# Patient Record
Sex: Female | Born: 1935 | Race: White | Hispanic: No | Marital: Married | Smoking: Former smoker
Health system: Southern US, Community
[De-identification: ages and names within clinical notes are randomized; demographics above are authoritative.]

## PROBLEM LIST (undated history)

## (undated) DIAGNOSIS — M1990 Unspecified osteoarthritis, unspecified site: Secondary | ICD-10-CM

## (undated) DIAGNOSIS — I10 Essential (primary) hypertension: Secondary | ICD-10-CM

## (undated) DIAGNOSIS — N189 Chronic kidney disease, unspecified: Secondary | ICD-10-CM

## (undated) DIAGNOSIS — E039 Hypothyroidism, unspecified: Secondary | ICD-10-CM

## (undated) HISTORY — PX: ABDOMINAL SURGERY: SHX537

## (undated) HISTORY — PX: ABDOMINAL HYSTERECTOMY: SHX81

## (undated) HISTORY — PX: APPENDECTOMY: SHX54

## (undated) HISTORY — PX: EYE SURGERY: SHX253

---

## 2005-07-24 ENCOUNTER — Ambulatory Visit: Payer: Self-pay | Admitting: Ophthalmology

## 2006-10-09 ENCOUNTER — Ambulatory Visit: Payer: Self-pay | Admitting: Internal Medicine

## 2008-02-17 ENCOUNTER — Ambulatory Visit: Payer: Self-pay | Admitting: Internal Medicine

## 2009-02-24 ENCOUNTER — Ambulatory Visit: Payer: Self-pay | Admitting: Internal Medicine

## 2011-01-03 ENCOUNTER — Ambulatory Visit: Payer: Self-pay | Admitting: Internal Medicine

## 2011-01-10 ENCOUNTER — Ambulatory Visit: Payer: Self-pay | Admitting: Internal Medicine

## 2015-06-27 DIAGNOSIS — N183 Chronic kidney disease, stage 3 (moderate): Secondary | ICD-10-CM | POA: Diagnosis not present

## 2015-06-27 DIAGNOSIS — M810 Age-related osteoporosis without current pathological fracture: Secondary | ICD-10-CM | POA: Diagnosis not present

## 2015-06-27 DIAGNOSIS — E559 Vitamin D deficiency, unspecified: Secondary | ICD-10-CM | POA: Diagnosis not present

## 2015-06-27 DIAGNOSIS — E785 Hyperlipidemia, unspecified: Secondary | ICD-10-CM | POA: Diagnosis not present

## 2015-07-04 DIAGNOSIS — R030 Elevated blood-pressure reading, without diagnosis of hypertension: Secondary | ICD-10-CM | POA: Diagnosis not present

## 2015-07-04 DIAGNOSIS — F419 Anxiety disorder, unspecified: Secondary | ICD-10-CM | POA: Diagnosis not present

## 2015-07-04 DIAGNOSIS — E559 Vitamin D deficiency, unspecified: Secondary | ICD-10-CM | POA: Diagnosis not present

## 2015-07-04 DIAGNOSIS — Z0000 Encounter for general adult medical examination without abnormal findings: Secondary | ICD-10-CM | POA: Diagnosis not present

## 2015-07-04 DIAGNOSIS — M810 Age-related osteoporosis without current pathological fracture: Secondary | ICD-10-CM | POA: Diagnosis not present

## 2015-07-04 DIAGNOSIS — Z720 Tobacco use: Secondary | ICD-10-CM | POA: Diagnosis not present

## 2015-07-04 DIAGNOSIS — E784 Other hyperlipidemia: Secondary | ICD-10-CM | POA: Diagnosis not present

## 2015-07-04 DIAGNOSIS — E038 Other specified hypothyroidism: Secondary | ICD-10-CM | POA: Diagnosis not present

## 2015-07-14 DIAGNOSIS — M810 Age-related osteoporosis without current pathological fracture: Secondary | ICD-10-CM | POA: Diagnosis not present

## 2015-07-20 DIAGNOSIS — M810 Age-related osteoporosis without current pathological fracture: Secondary | ICD-10-CM | POA: Diagnosis not present

## 2015-07-20 DIAGNOSIS — E559 Vitamin D deficiency, unspecified: Secondary | ICD-10-CM | POA: Diagnosis not present

## 2015-07-20 DIAGNOSIS — R946 Abnormal results of thyroid function studies: Secondary | ICD-10-CM | POA: Diagnosis not present

## 2015-07-22 DIAGNOSIS — M810 Age-related osteoporosis without current pathological fracture: Secondary | ICD-10-CM | POA: Diagnosis not present

## 2015-08-02 DIAGNOSIS — E038 Other specified hypothyroidism: Secondary | ICD-10-CM | POA: Diagnosis not present

## 2015-10-12 DIAGNOSIS — E559 Vitamin D deficiency, unspecified: Secondary | ICD-10-CM | POA: Diagnosis not present

## 2015-10-19 DIAGNOSIS — M810 Age-related osteoporosis without current pathological fracture: Secondary | ICD-10-CM | POA: Diagnosis not present

## 2015-10-19 DIAGNOSIS — E559 Vitamin D deficiency, unspecified: Secondary | ICD-10-CM | POA: Diagnosis not present

## 2016-06-28 DIAGNOSIS — F419 Anxiety disorder, unspecified: Secondary | ICD-10-CM | POA: Diagnosis not present

## 2016-06-28 DIAGNOSIS — E038 Other specified hypothyroidism: Secondary | ICD-10-CM | POA: Diagnosis not present

## 2016-06-28 DIAGNOSIS — E559 Vitamin D deficiency, unspecified: Secondary | ICD-10-CM | POA: Diagnosis not present

## 2016-06-28 DIAGNOSIS — R030 Elevated blood-pressure reading, without diagnosis of hypertension: Secondary | ICD-10-CM | POA: Diagnosis not present

## 2016-06-28 DIAGNOSIS — M810 Age-related osteoporosis without current pathological fracture: Secondary | ICD-10-CM | POA: Diagnosis not present

## 2016-06-28 DIAGNOSIS — E784 Other hyperlipidemia: Secondary | ICD-10-CM | POA: Diagnosis not present

## 2016-06-28 DIAGNOSIS — Z720 Tobacco use: Secondary | ICD-10-CM | POA: Diagnosis not present

## 2016-07-09 DIAGNOSIS — R030 Elevated blood-pressure reading, without diagnosis of hypertension: Secondary | ICD-10-CM | POA: Diagnosis not present

## 2016-07-09 DIAGNOSIS — H268 Other specified cataract: Secondary | ICD-10-CM | POA: Diagnosis not present

## 2016-07-09 DIAGNOSIS — Z720 Tobacco use: Secondary | ICD-10-CM | POA: Diagnosis not present

## 2016-07-09 DIAGNOSIS — F419 Anxiety disorder, unspecified: Secondary | ICD-10-CM | POA: Diagnosis not present

## 2016-07-09 DIAGNOSIS — R946 Abnormal results of thyroid function studies: Secondary | ICD-10-CM | POA: Diagnosis not present

## 2016-07-09 DIAGNOSIS — J449 Chronic obstructive pulmonary disease, unspecified: Secondary | ICD-10-CM | POA: Diagnosis not present

## 2016-07-09 DIAGNOSIS — E559 Vitamin D deficiency, unspecified: Secondary | ICD-10-CM | POA: Diagnosis not present

## 2016-07-09 DIAGNOSIS — M810 Age-related osteoporosis without current pathological fracture: Secondary | ICD-10-CM | POA: Diagnosis not present

## 2016-07-09 DIAGNOSIS — Z87898 Personal history of other specified conditions: Secondary | ICD-10-CM | POA: Diagnosis not present

## 2016-07-09 DIAGNOSIS — E784 Other hyperlipidemia: Secondary | ICD-10-CM | POA: Diagnosis not present

## 2016-07-09 DIAGNOSIS — Z0000 Encounter for general adult medical examination without abnormal findings: Secondary | ICD-10-CM | POA: Diagnosis not present

## 2016-07-30 DIAGNOSIS — R946 Abnormal results of thyroid function studies: Secondary | ICD-10-CM | POA: Diagnosis not present

## 2016-08-01 DIAGNOSIS — H2512 Age-related nuclear cataract, left eye: Secondary | ICD-10-CM | POA: Diagnosis not present

## 2016-08-06 DIAGNOSIS — E784 Other hyperlipidemia: Secondary | ICD-10-CM | POA: Diagnosis not present

## 2016-08-06 DIAGNOSIS — E039 Hypothyroidism, unspecified: Secondary | ICD-10-CM | POA: Diagnosis not present

## 2016-08-06 DIAGNOSIS — D692 Other nonthrombocytopenic purpura: Secondary | ICD-10-CM | POA: Diagnosis not present

## 2016-08-06 DIAGNOSIS — E559 Vitamin D deficiency, unspecified: Secondary | ICD-10-CM | POA: Diagnosis not present

## 2016-08-06 DIAGNOSIS — M810 Age-related osteoporosis without current pathological fracture: Secondary | ICD-10-CM | POA: Diagnosis not present

## 2016-08-06 DIAGNOSIS — Z720 Tobacco use: Secondary | ICD-10-CM | POA: Diagnosis not present

## 2016-08-06 DIAGNOSIS — R030 Elevated blood-pressure reading, without diagnosis of hypertension: Secondary | ICD-10-CM | POA: Diagnosis not present

## 2016-08-14 DIAGNOSIS — J40 Bronchitis, not specified as acute or chronic: Secondary | ICD-10-CM | POA: Diagnosis not present

## 2016-09-25 DIAGNOSIS — H2512 Age-related nuclear cataract, left eye: Secondary | ICD-10-CM | POA: Diagnosis not present

## 2016-10-03 ENCOUNTER — Encounter: Payer: Self-pay | Admitting: *Deleted

## 2016-10-04 ENCOUNTER — Ambulatory Visit
Admission: RE | Admit: 2016-10-04 | Discharge: 2016-10-04 | Disposition: A | Discharge status: Home / Self Care | Payer: PPO | Source: Ambulatory Visit | Attending: Ophthalmology | Admitting: Ophthalmology

## 2016-10-04 ENCOUNTER — Ambulatory Visit: Payer: PPO | Admitting: Certified Registered"

## 2016-10-04 ENCOUNTER — Encounter: Payer: Self-pay | Admitting: *Deleted

## 2016-10-04 ENCOUNTER — Encounter
Admission: RE | Disposition: A | Discharge status: Home / Self Care | Payer: Self-pay | Source: Ambulatory Visit | Attending: Ophthalmology

## 2016-10-04 DIAGNOSIS — H2512 Age-related nuclear cataract, left eye: Secondary | ICD-10-CM | POA: Insufficient documentation

## 2016-10-04 DIAGNOSIS — Z79899 Other long term (current) drug therapy: Secondary | ICD-10-CM | POA: Diagnosis not present

## 2016-10-04 DIAGNOSIS — E039 Hypothyroidism, unspecified: Secondary | ICD-10-CM | POA: Diagnosis not present

## 2016-10-04 DIAGNOSIS — F17200 Nicotine dependence, unspecified, uncomplicated: Secondary | ICD-10-CM | POA: Insufficient documentation

## 2016-10-04 HISTORY — PX: CATARACT EXTRACTION W/PHACO: SHX586

## 2016-10-04 HISTORY — DX: Hypothyroidism, unspecified: E03.9

## 2016-10-04 SURGERY — PHACOEMULSIFICATION, CATARACT, WITH IOL INSERTION
Anesthesia: Monitor Anesthesia Care | Site: Eye | Laterality: Left | Wound class: Clean

## 2016-10-04 MED ORDER — MOXIFLOXACIN HCL 0.5 % OP SOLN
OPHTHALMIC | Status: AC
  Filled 2016-10-04: qty 3

## 2016-10-04 MED ORDER — FENTANYL CITRATE (PF) 100 MCG/2ML IJ SOLN
INTRAMUSCULAR | Status: DC | PRN
  Administered 2016-10-04: 50 ug via INTRAVENOUS

## 2016-10-04 MED ORDER — ARMC OPHTHALMIC DILATING DROPS
1.0000 "application " | OPHTHALMIC | Status: AC
  Administered 2016-10-04 (×3): 1 via OPHTHALMIC

## 2016-10-04 MED ORDER — SODIUM HYALURONATE 10 MG/ML IO SOLN
INTRAOCULAR | Status: DC | PRN
  Administered 2016-10-04: 0.85 mL via INTRAOCULAR

## 2016-10-04 MED ORDER — ARMC OPHTHALMIC DILATING DROPS
OPHTHALMIC | Status: AC
  Administered 2016-10-04: 1 via OPHTHALMIC
  Filled 2016-10-04: qty 0.4

## 2016-10-04 MED ORDER — POVIDONE-IODINE 5 % OP SOLN
OPHTHALMIC | Status: AC
  Filled 2016-10-04: qty 30

## 2016-10-04 MED ORDER — MOXIFLOXACIN HCL 0.5 % OP SOLN
OPHTHALMIC | Status: DC | PRN
  Administered 2016-10-04: 1 [drp] via OPHTHALMIC

## 2016-10-04 MED ORDER — POVIDONE-IODINE 5 % OP SOLN
OPHTHALMIC | Status: DC | PRN
  Administered 2016-10-04: 1 via OPHTHALMIC

## 2016-10-04 MED ORDER — SODIUM HYALURONATE 23 MG/ML IO SOLN
INTRAOCULAR | Status: AC
  Filled 2016-10-04: qty 0.6

## 2016-10-04 MED ORDER — ARMC OPHTHALMIC DILATING DROPS: 1.0000 "application " | OPHTHALMIC | Status: DC

## 2016-10-04 MED ORDER — BSS IO SOLN
INTRAOCULAR | Status: DC | PRN
  Administered 2016-10-04: 200 mL via OPHTHALMIC

## 2016-10-04 MED ORDER — LIDOCAINE HCL (PF) 4 % IJ SOLN
INTRAOCULAR | Status: DC | PRN
  Administered 2016-10-04: 4 mL via OPHTHALMIC

## 2016-10-04 MED ORDER — SODIUM HYALURONATE 23 MG/ML IO SOLN
INTRAOCULAR | Status: DC | PRN
  Administered 2016-10-04: 0.6 mL via INTRAOCULAR

## 2016-10-04 MED ORDER — MOXIFLOXACIN HCL 0.5 % OP SOLN: 1.0000 [drp] | OPHTHALMIC | Status: DC | PRN

## 2016-10-04 MED ORDER — EPINEPHRINE PF 1 MG/ML IJ SOLN
INTRAMUSCULAR | Status: AC
  Filled 2016-10-04: qty 2

## 2016-10-04 MED ORDER — LIDOCAINE HCL (PF) 2 % IJ SOLN
INTRAMUSCULAR | Status: AC
  Filled 2016-10-04: qty 2

## 2016-10-04 MED ORDER — CARBACHOL 0.01 % IO SOLN
INTRAOCULAR | Status: DC | PRN
  Administered 2016-10-04: 0.5 mL via INTRAOCULAR

## 2016-10-04 MED ORDER — SODIUM HYALURONATE 10 MG/ML IO SOLN
INTRAOCULAR | Status: AC
  Filled 2016-10-04: qty 0.85

## 2016-10-04 MED ORDER — MIDAZOLAM HCL 2 MG/2ML IJ SOLN
INTRAMUSCULAR | Status: DC | PRN
  Administered 2016-10-04: 1 mg via INTRAVENOUS

## 2016-10-04 MED ORDER — SODIUM CHLORIDE 0.9 % IV SOLN
INTRAVENOUS | Status: DC
  Administered 2016-10-04: 09:00:00 via INTRAVENOUS

## 2016-10-04 SURGICAL SUPPLY — 16 items
DISSECTOR HYDRO NUCLEUS 50X22 (MISCELLANEOUS) ×3 IMPLANT
GLOVE BIO SURGEON STRL SZ8 (GLOVE) ×3 IMPLANT
GLOVE BIOGEL M 6.5 STRL (GLOVE) ×3 IMPLANT
GLOVE SURG LX 7.5 STRW (GLOVE) ×2
GLOVE SURG LX STRL 7.5 STRW (GLOVE) ×1 IMPLANT
GOWN STRL REUS W/ TWL LRG LVL3 (GOWN DISPOSABLE) ×2 IMPLANT
GOWN STRL REUS W/TWL LRG LVL3 (GOWN DISPOSABLE) ×4
LENS IOL ACRSF IQ ULTRA 23.5 (Intraocular Lens) ×1 IMPLANT
LENS IOL ACRYSOF IQ 23.5 (Intraocular Lens) ×3 IMPLANT
PACK CATARACT (MISCELLANEOUS) ×3 IMPLANT
PACK CATARACT KING (MISCELLANEOUS) ×3 IMPLANT
PACK EYE AFTER SURG (MISCELLANEOUS) ×3 IMPLANT
SOL BSS BAG (MISCELLANEOUS) ×3
SOLUTION BSS BAG (MISCELLANEOUS) ×1 IMPLANT
WATER STERILE IRR 250ML POUR (IV SOLUTION) ×3 IMPLANT
WIPE NON LINTING 3.25X3.25 (MISCELLANEOUS) ×3 IMPLANT

## 2016-10-04 NOTE — Anesthesia Preprocedure Evaluation (Signed)
Anesthesia Evaluation  Patient identified by MRN, date of birth, ID band Patient awake    Reviewed: Allergy & Precautions, NPO status , Patient's Chart, lab work & pertinent test results  History of Anesthesia Complications Negative for: history of anesthetic complications  Airway Mallampati: II  TM Distance: >3 FB Neck ROM: Full    Dental no notable dental hx.    Pulmonary neg sleep apnea, neg COPD, Current Smoker,    breath sounds clear to auscultation- rhonchi (-) wheezing      Cardiovascular Exercise Tolerance: Good (-) hypertension(-) CAD and (-) Past MI  Rhythm:Regular Rate:Normal - Systolic murmurs and - Diastolic murmurs    Neuro/Psych negative neurological ROS  negative psych ROS   GI/Hepatic negative GI ROS, Neg liver ROS,   Endo/Other  neg diabetesHypothyroidism   Renal/GU negative Renal ROS     Musculoskeletal negative musculoskeletal ROS (+)   Abdominal (+) - obese,   Peds  Hematology negative hematology ROS (+)   Anesthesia Other Findings Past Medical History: No date: Hypothyroidism   Reproductive/Obstetrics                             Anesthesia Physical Anesthesia Plan  ASA: II  Anesthesia Plan: MAC   Post-op Pain Management:    Induction: Intravenous  Airway Management Planned: Natural Airway  Additional Equipment:   Intra-op Plan:   Post-operative Plan:   Informed Consent: I have reviewed the patients History and Physical, chart, labs and discussed the procedure including the risks, benefits and alternatives for the proposed anesthesia with the patient or authorized representative who has indicated his/her understanding and acceptance.     Plan Discussed with: CRNA and Anesthesiologist  Anesthesia Plan Comments:         Anesthesia Quick Evaluation

## 2016-10-04 NOTE — Transfer of Care (Signed)
Immediate Anesthesia Transfer of Care Note  Patient: Amy Hensley  Procedure(s) Performed: Procedure(s) with comments: CATARACT EXTRACTION PHACO AND INTRAOCULAR LENS PLACEMENT (IOC) (Left) - us02:17.8 ap%19.6 cde27.31 fluid lot # [id number]  Patient Location: Short Stay  Anesthesia Type:MAC  Level of Consciousness: awake, alert  and oriented  Airway & Oxygen Therapy: Patient Spontanous Breathing  Post-op Assessment: Report given to RN and Post -op Vital signs reviewed and stable  Post vital signs: Reviewed  Last Vitals:  Vitals:   10/04/16 0912 10/04/16 1123  BP: (!) 163/82 107/82  Pulse: 75 70  Resp: 18 18  Temp: 36.6 C 36.6 C    Last Pain:  Vitals:   10/04/16 0912  TempSrc: Oral         Complications: No apparent anesthesia complications

## 2016-10-04 NOTE — H&P (Signed)
The History and Physical notes are on paper, have been signed, and are to be scanned.   I have examined the patient and there are no changes to the H&P.   Amy Hensley 10/04/2016 10:38 AM

## 2016-10-04 NOTE — Op Note (Signed)
OPERATIVE NOTE  Amy Hensley [id number] 10/04/2016   PREOPERATIVE DIAGNOSIS:  Nuclear sclerotic cataract left eye.  H25.12   POSTOPERATIVE DIAGNOSIS:    Nuclear sclerotic cataract left eye.     PROCEDURE:  Phacoemusification with posterior chamber intraocular lens placement of the left eye   LENS:   Implant Name Type Inv. Item Serial No. Manufacturer Lot No. LRB No. Used  LENS IOL ACRYSOF IQ 23.5 - [id number] Intraocular Lens LENS IOL ACRYSOF IQ 23.5 [id number] [employer]   Left 1       [id number] +23.5   ULTRASOUND TIME: 2 minutes 17 seconds.  CDE 27.31   SURGEON:  Benay Pillow, MD, MPH   ANESTHESIA:  Topical with tetracaine drops augmented with 1% preservative-free intracameral lidocaine.  ESTIMATED BLOOD LOSS: <1 mL   COMPLICATIONS:  None.   DESCRIPTION OF PROCEDURE:  The patient was identified in the holding room and transported to the operating room and placed in the supine position under the operating microscope.  The left eye was identified as the operative eye and it was prepped and draped in the usual sterile ophthalmic fashion.   A 1.0 millimeter clear-corneal paracentesis was made at the 5:00 position. 0.5 ml of preservative-free 1% lidocaine with epinephrine was injected into the anterior chamber.  The anterior chamber was filled with Healon 5 viscoelastic.  A 2.4 millimeter keratome was used to make a near-clear corneal incision at the 2:00 position.  A curvilinear capsulorrhexis was made with a cystotome and capsulorrhexis forceps.  Balanced salt solution was used to hydrodissect and hydrodelineate the nucleus.   Phacoemulsification was then used in stop and chop fashion to remove the lens nucleus and epinucleus.   The lens was extremely dense, requiring over 10 CDE just to sculpt the initial trench.  Many chopping maneuvers were performed to manipulate the lens into digestible pieces.   The remaining cortex was then removed using the irrigation and aspiration  handpiece. Healon was then placed into the capsular bag to distend it for lens placement.  A lens was then injected into the capsular bag.  The remaining viscoelastic was aspirated.   Wounds were hydrated with balanced salt solution.  The anterior chamber was inflated to a physiologic pressure with balanced salt solution.   Intracameral vigamox 0.1 mL undiltued was injected into the eye and a drop placed onto the ocular surface.  No wound leaks were noted.  The patient was taken to the recovery room in stable condition without complications of anesthesia or surgery  [name] 10/04/2016, 11:21 AM

## 2016-10-04 NOTE — Discharge Instructions (Signed)
Eye Surgery Discharge Instructions  Expect mild scratchy sensation or mild soreness. DO NOT RUB YOUR EYE!  The day of surgery:  Minimal physical activity, but bed rest is not required  No reading, computer work, or close hand work  No bending, lifting, or straining.  May watch TV  For 24 hours:  No driving, legal decisions, or alcoholic beverages  Safety precautions  Eat anything you prefer: It is better to start with liquids, then soup then solid foods.  _____ Eye patch should be worn until postoperative exam tomorrow.  ____ Solar shield eyeglasses should be worn for comfort in the sunlight/patch while sleeping  Resume all regular medications including aspirin or Coumadin if these were discontinued prior to surgery. You may shower, bathe, shave, or wash your hair. Tylenol may be taken for mild discomfort.  Call your doctor if you experience significant pain, nausea, or vomiting, fever > 101 or other signs of infection. [phone] or [phone] Specific instructions:  Follow-up Information    Eulogio Bear, MD Follow up.   Specialty:  Ophthalmology Why:  May 25 at 10:05am Contact information: [address] [place] [address] [phone]

## 2016-10-04 NOTE — Anesthesia Postprocedure Evaluation (Signed)
Anesthesia Post Note  Patient: Amy Hensley  Procedure(s) Performed: Procedure(s) (LRB): CATARACT EXTRACTION PHACO AND INTRAOCULAR LENS PLACEMENT (IOC) (Left)  Patient location during evaluation: Short Stay Anesthesia Type: MAC Level of consciousness: awake and alert Pain management: pain level controlled Vital Signs Assessment: post-procedure vital signs reviewed and stable Respiratory status: spontaneous breathing, nonlabored ventilation, respiratory function stable and patient connected to nasal cannula oxygen Cardiovascular status: stable and blood pressure returned to baseline Postop Assessment: no headache Anesthetic complications: no     Last Vitals:  Vitals:   10/04/16 0912 10/04/16 1123  BP: (!) 163/82 107/82  Pulse: 75 70  Resp: 18 18  Temp: 36.6 C 36.6 C    Last Pain:  Vitals:   10/04/16 0912  TempSrc: Oral                 Brantley Fling

## 2016-10-04 NOTE — Anesthesia Post-op Follow-up Note (Cosign Needed)
Anesthesia QCDR form completed.        

## 2016-10-24 DIAGNOSIS — Z011 Encounter for examination of ears and hearing: Secondary | ICD-10-CM | POA: Diagnosis not present

## 2017-01-30 DIAGNOSIS — E784 Other hyperlipidemia: Secondary | ICD-10-CM | POA: Diagnosis not present

## 2017-01-30 DIAGNOSIS — E559 Vitamin D deficiency, unspecified: Secondary | ICD-10-CM | POA: Diagnosis not present

## 2017-01-30 DIAGNOSIS — R030 Elevated blood-pressure reading, without diagnosis of hypertension: Secondary | ICD-10-CM | POA: Diagnosis not present

## 2017-02-18 DIAGNOSIS — E7849 Other hyperlipidemia: Secondary | ICD-10-CM | POA: Diagnosis not present

## 2017-02-18 DIAGNOSIS — M810 Age-related osteoporosis without current pathological fracture: Secondary | ICD-10-CM | POA: Diagnosis not present

## 2017-02-18 DIAGNOSIS — D692 Other nonthrombocytopenic purpura: Secondary | ICD-10-CM | POA: Diagnosis not present

## 2017-02-18 DIAGNOSIS — R030 Elevated blood-pressure reading, without diagnosis of hypertension: Secondary | ICD-10-CM | POA: Diagnosis not present

## 2017-02-18 DIAGNOSIS — Z87898 Personal history of other specified conditions: Secondary | ICD-10-CM | POA: Diagnosis not present

## 2017-02-18 DIAGNOSIS — E039 Hypothyroidism, unspecified: Secondary | ICD-10-CM | POA: Diagnosis not present

## 2017-02-18 DIAGNOSIS — Z720 Tobacco use: Secondary | ICD-10-CM | POA: Diagnosis not present

## 2017-02-18 DIAGNOSIS — F419 Anxiety disorder, unspecified: Secondary | ICD-10-CM | POA: Diagnosis not present

## 2017-03-07 DIAGNOSIS — D369 Benign neoplasm, unspecified site: Secondary | ICD-10-CM | POA: Diagnosis not present

## 2017-03-07 DIAGNOSIS — D2262 Melanocytic nevi of left upper limb, including shoulder: Secondary | ICD-10-CM | POA: Diagnosis not present

## 2017-03-07 DIAGNOSIS — C44612 Basal cell carcinoma of skin of right upper limb, including shoulder: Secondary | ICD-10-CM | POA: Diagnosis not present

## 2017-03-07 DIAGNOSIS — D225 Melanocytic nevi of trunk: Secondary | ICD-10-CM | POA: Diagnosis not present

## 2017-03-07 DIAGNOSIS — D2271 Melanocytic nevi of right lower limb, including hip: Secondary | ICD-10-CM | POA: Diagnosis not present

## 2017-03-07 DIAGNOSIS — D485 Neoplasm of uncertain behavior of skin: Secondary | ICD-10-CM | POA: Diagnosis not present

## 2017-03-07 DIAGNOSIS — L821 Other seborrheic keratosis: Secondary | ICD-10-CM | POA: Diagnosis not present

## 2017-03-18 DIAGNOSIS — E039 Hypothyroidism, unspecified: Secondary | ICD-10-CM | POA: Diagnosis not present

## 2017-03-22 DIAGNOSIS — C44612 Basal cell carcinoma of skin of right upper limb, including shoulder: Secondary | ICD-10-CM | POA: Diagnosis not present

## 2017-06-14 DIAGNOSIS — M79642 Pain in left hand: Secondary | ICD-10-CM | POA: Diagnosis not present

## 2017-06-14 DIAGNOSIS — M7989 Other specified soft tissue disorders: Secondary | ICD-10-CM | POA: Diagnosis not present

## 2017-06-18 DIAGNOSIS — M7989 Other specified soft tissue disorders: Secondary | ICD-10-CM | POA: Diagnosis not present

## 2017-06-18 DIAGNOSIS — M1812 Unilateral primary osteoarthritis of first carpometacarpal joint, left hand: Secondary | ICD-10-CM | POA: Diagnosis not present

## 2017-06-18 DIAGNOSIS — M79602 Pain in left arm: Secondary | ICD-10-CM | POA: Diagnosis not present

## 2017-08-12 DIAGNOSIS — D692 Other nonthrombocytopenic purpura: Secondary | ICD-10-CM | POA: Diagnosis not present

## 2017-08-12 DIAGNOSIS — E039 Hypothyroidism, unspecified: Secondary | ICD-10-CM | POA: Diagnosis not present

## 2017-08-12 DIAGNOSIS — E7849 Other hyperlipidemia: Secondary | ICD-10-CM | POA: Diagnosis not present

## 2017-08-12 DIAGNOSIS — R030 Elevated blood-pressure reading, without diagnosis of hypertension: Secondary | ICD-10-CM | POA: Diagnosis not present

## 2017-08-12 DIAGNOSIS — Z720 Tobacco use: Secondary | ICD-10-CM | POA: Diagnosis not present

## 2017-08-19 DIAGNOSIS — D692 Other nonthrombocytopenic purpura: Secondary | ICD-10-CM | POA: Diagnosis not present

## 2017-08-19 DIAGNOSIS — E559 Vitamin D deficiency, unspecified: Secondary | ICD-10-CM | POA: Diagnosis not present

## 2017-08-19 DIAGNOSIS — R030 Elevated blood-pressure reading, without diagnosis of hypertension: Secondary | ICD-10-CM | POA: Diagnosis not present

## 2017-08-19 DIAGNOSIS — Z0000 Encounter for general adult medical examination without abnormal findings: Secondary | ICD-10-CM | POA: Diagnosis not present

## 2017-08-19 DIAGNOSIS — N183 Chronic kidney disease, stage 3 (moderate): Secondary | ICD-10-CM | POA: Diagnosis not present

## 2017-08-19 DIAGNOSIS — E7849 Other hyperlipidemia: Secondary | ICD-10-CM | POA: Diagnosis not present

## 2017-08-19 DIAGNOSIS — E039 Hypothyroidism, unspecified: Secondary | ICD-10-CM | POA: Diagnosis not present

## 2017-08-19 DIAGNOSIS — M810 Age-related osteoporosis without current pathological fracture: Secondary | ICD-10-CM | POA: Diagnosis not present

## 2017-08-19 DIAGNOSIS — Z720 Tobacco use: Secondary | ICD-10-CM | POA: Diagnosis not present

## 2017-08-26 DIAGNOSIS — M810 Age-related osteoporosis without current pathological fracture: Secondary | ICD-10-CM | POA: Diagnosis not present

## 2017-09-19 DIAGNOSIS — L728 Other follicular cysts of the skin and subcutaneous tissue: Secondary | ICD-10-CM | POA: Diagnosis not present

## 2017-09-19 DIAGNOSIS — D2262 Melanocytic nevi of left upper limb, including shoulder: Secondary | ICD-10-CM | POA: Diagnosis not present

## 2017-09-19 DIAGNOSIS — D2272 Melanocytic nevi of left lower limb, including hip: Secondary | ICD-10-CM | POA: Diagnosis not present

## 2017-09-19 DIAGNOSIS — D2271 Melanocytic nevi of right lower limb, including hip: Secondary | ICD-10-CM | POA: Diagnosis not present

## 2017-09-19 DIAGNOSIS — Z08 Encounter for follow-up examination after completed treatment for malignant neoplasm: Secondary | ICD-10-CM | POA: Diagnosis not present

## 2017-09-19 DIAGNOSIS — Z85828 Personal history of other malignant neoplasm of skin: Secondary | ICD-10-CM | POA: Diagnosis not present

## 2017-09-19 DIAGNOSIS — L821 Other seborrheic keratosis: Secondary | ICD-10-CM | POA: Diagnosis not present

## 2017-09-19 DIAGNOSIS — D2261 Melanocytic nevi of right upper limb, including shoulder: Secondary | ICD-10-CM | POA: Diagnosis not present

## 2018-02-12 DIAGNOSIS — R030 Elevated blood-pressure reading, without diagnosis of hypertension: Secondary | ICD-10-CM | POA: Diagnosis not present

## 2018-02-12 DIAGNOSIS — E7849 Other hyperlipidemia: Secondary | ICD-10-CM | POA: Diagnosis not present

## 2018-02-12 DIAGNOSIS — E559 Vitamin D deficiency, unspecified: Secondary | ICD-10-CM | POA: Diagnosis not present

## 2018-02-12 DIAGNOSIS — M810 Age-related osteoporosis without current pathological fracture: Secondary | ICD-10-CM | POA: Diagnosis not present

## 2018-02-12 DIAGNOSIS — D692 Other nonthrombocytopenic purpura: Secondary | ICD-10-CM | POA: Diagnosis not present

## 2018-02-12 DIAGNOSIS — E039 Hypothyroidism, unspecified: Secondary | ICD-10-CM | POA: Diagnosis not present

## 2018-02-19 DIAGNOSIS — E7849 Other hyperlipidemia: Secondary | ICD-10-CM | POA: Diagnosis not present

## 2018-02-19 DIAGNOSIS — E559 Vitamin D deficiency, unspecified: Secondary | ICD-10-CM | POA: Diagnosis not present

## 2018-02-19 DIAGNOSIS — E039 Hypothyroidism, unspecified: Secondary | ICD-10-CM | POA: Diagnosis not present

## 2018-02-19 DIAGNOSIS — Z720 Tobacco use: Secondary | ICD-10-CM | POA: Diagnosis not present

## 2018-02-19 DIAGNOSIS — D692 Other nonthrombocytopenic purpura: Secondary | ICD-10-CM | POA: Diagnosis not present

## 2018-02-19 DIAGNOSIS — N183 Chronic kidney disease, stage 3 (moderate): Secondary | ICD-10-CM | POA: Diagnosis not present

## 2018-02-19 DIAGNOSIS — R030 Elevated blood-pressure reading, without diagnosis of hypertension: Secondary | ICD-10-CM | POA: Diagnosis not present

## 2018-10-01 DIAGNOSIS — E7849 Other hyperlipidemia: Secondary | ICD-10-CM | POA: Diagnosis not present

## 2018-10-01 DIAGNOSIS — N183 Chronic kidney disease, stage 3 (moderate): Secondary | ICD-10-CM | POA: Diagnosis not present

## 2018-10-01 DIAGNOSIS — R030 Elevated blood-pressure reading, without diagnosis of hypertension: Secondary | ICD-10-CM | POA: Diagnosis not present

## 2018-10-01 DIAGNOSIS — E039 Hypothyroidism, unspecified: Secondary | ICD-10-CM | POA: Diagnosis not present

## 2018-10-07 DIAGNOSIS — D692 Other nonthrombocytopenic purpura: Secondary | ICD-10-CM | POA: Diagnosis not present

## 2018-10-07 DIAGNOSIS — R030 Elevated blood-pressure reading, without diagnosis of hypertension: Secondary | ICD-10-CM | POA: Diagnosis not present

## 2018-10-07 DIAGNOSIS — R3129 Other microscopic hematuria: Secondary | ICD-10-CM | POA: Diagnosis not present

## 2018-10-07 DIAGNOSIS — Z0000 Encounter for general adult medical examination without abnormal findings: Secondary | ICD-10-CM | POA: Diagnosis not present

## 2018-10-07 DIAGNOSIS — N183 Chronic kidney disease, stage 3 (moderate): Secondary | ICD-10-CM | POA: Diagnosis not present

## 2018-10-07 DIAGNOSIS — E039 Hypothyroidism, unspecified: Secondary | ICD-10-CM | POA: Diagnosis not present

## 2018-10-07 DIAGNOSIS — M810 Age-related osteoporosis without current pathological fracture: Secondary | ICD-10-CM | POA: Diagnosis not present

## 2018-10-07 DIAGNOSIS — I10 Essential (primary) hypertension: Secondary | ICD-10-CM | POA: Diagnosis not present

## 2018-10-07 DIAGNOSIS — F419 Anxiety disorder, unspecified: Secondary | ICD-10-CM | POA: Diagnosis not present

## 2018-10-07 DIAGNOSIS — E559 Vitamin D deficiency, unspecified: Secondary | ICD-10-CM | POA: Diagnosis not present

## 2018-10-07 DIAGNOSIS — E7849 Other hyperlipidemia: Secondary | ICD-10-CM | POA: Diagnosis not present

## 2018-10-07 DIAGNOSIS — Z720 Tobacco use: Secondary | ICD-10-CM | POA: Diagnosis not present

## 2018-11-11 DIAGNOSIS — R030 Elevated blood-pressure reading, without diagnosis of hypertension: Secondary | ICD-10-CM | POA: Diagnosis not present

## 2018-11-11 DIAGNOSIS — N183 Chronic kidney disease, stage 3 (moderate): Secondary | ICD-10-CM | POA: Diagnosis not present

## 2018-11-11 DIAGNOSIS — E039 Hypothyroidism, unspecified: Secondary | ICD-10-CM | POA: Diagnosis not present

## 2018-11-11 DIAGNOSIS — I10 Essential (primary) hypertension: Secondary | ICD-10-CM | POA: Diagnosis not present

## 2018-11-11 DIAGNOSIS — D692 Other nonthrombocytopenic purpura: Secondary | ICD-10-CM | POA: Diagnosis not present

## 2018-11-11 DIAGNOSIS — M810 Age-related osteoporosis without current pathological fracture: Secondary | ICD-10-CM | POA: Diagnosis not present

## 2018-11-11 DIAGNOSIS — E7849 Other hyperlipidemia: Secondary | ICD-10-CM | POA: Diagnosis not present

## 2018-11-11 DIAGNOSIS — E559 Vitamin D deficiency, unspecified: Secondary | ICD-10-CM | POA: Diagnosis not present

## 2018-11-17 ENCOUNTER — Other Ambulatory Visit (HOSPITAL_COMMUNITY): Payer: Self-pay | Admitting: Internal Medicine

## 2018-11-17 ENCOUNTER — Other Ambulatory Visit: Payer: Self-pay | Admitting: Internal Medicine

## 2018-11-17 DIAGNOSIS — K439 Ventral hernia without obstruction or gangrene: Secondary | ICD-10-CM

## 2018-11-17 DIAGNOSIS — R030 Elevated blood-pressure reading, without diagnosis of hypertension: Secondary | ICD-10-CM | POA: Diagnosis not present

## 2018-11-17 DIAGNOSIS — R1903 Right lower quadrant abdominal swelling, mass and lump: Secondary | ICD-10-CM | POA: Diagnosis not present

## 2018-11-17 DIAGNOSIS — N183 Chronic kidney disease, stage 3 (moderate): Secondary | ICD-10-CM | POA: Diagnosis not present

## 2018-11-17 DIAGNOSIS — D692 Other nonthrombocytopenic purpura: Secondary | ICD-10-CM | POA: Diagnosis not present

## 2018-11-21 ENCOUNTER — Ambulatory Visit
Admission: RE | Admit: 2018-11-21 | Discharge: 2018-11-21 | Disposition: A | Discharge status: Home / Self Care | Payer: PPO | Source: Ambulatory Visit | Attending: Internal Medicine | Admitting: Internal Medicine

## 2018-11-21 ENCOUNTER — Other Ambulatory Visit: Payer: Self-pay

## 2018-11-21 DIAGNOSIS — K760 Fatty (change of) liver, not elsewhere classified: Secondary | ICD-10-CM | POA: Diagnosis not present

## 2018-11-21 DIAGNOSIS — R1903 Right lower quadrant abdominal swelling, mass and lump: Secondary | ICD-10-CM | POA: Diagnosis not present

## 2018-11-21 DIAGNOSIS — K439 Ventral hernia without obstruction or gangrene: Secondary | ICD-10-CM | POA: Insufficient documentation

## 2018-11-21 DIAGNOSIS — K4090 Unilateral inguinal hernia, without obstruction or gangrene, not specified as recurrent: Secondary | ICD-10-CM | POA: Diagnosis not present

## 2018-11-21 LAB — POCT I-STAT CREATININE: Creatinine, Ser: 1.5 mg/dL — ABNORMAL HIGH (ref 0.44–1.00)

## 2018-11-21 IMAGING — CT CT ABDOMEN AND PELVIS WITH CONTRAST
2 of 5 series · 16 of 46 positions shown, 18 images · IV contrast (omnipaque)
Comparison: None.

CLINICAL DATA: Right lower pelvic palpable mass. Clinical concern
for a hernia.

EXAM:
CT ABDOMEN AND PELVIS WITH CONTRAST
TECHNIQUE: Multidetector CT imaging of the abdomen and pelvis was performed
using the standard protocol following bolus administration of
intravenous contrast.
CONTRAST:  60mL OMNIPAQUE IOHEXOL 300 MG/ML  SOLN

[Series 2: abd pelvis · axial · 0.73mm/px · z∈[-1523,-1183]mm · 13 of 78 slices shown, 15 images]
[im 5/78  soft-tissue]
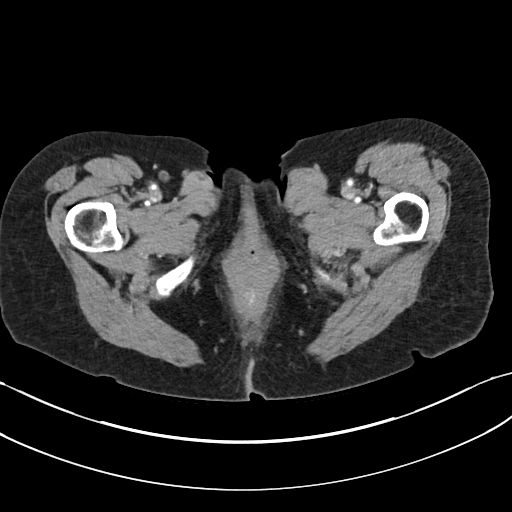
[im 5/78  bone]
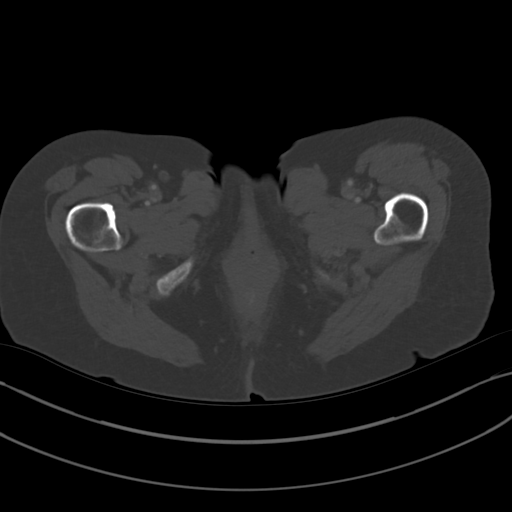
[im 9/78  soft-tissue]
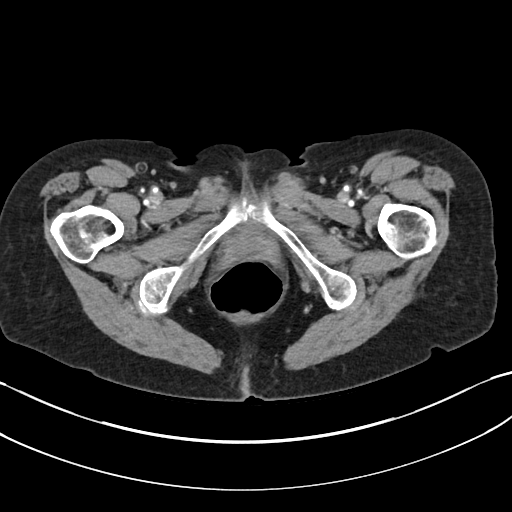
[im 18/78  soft-tissue]
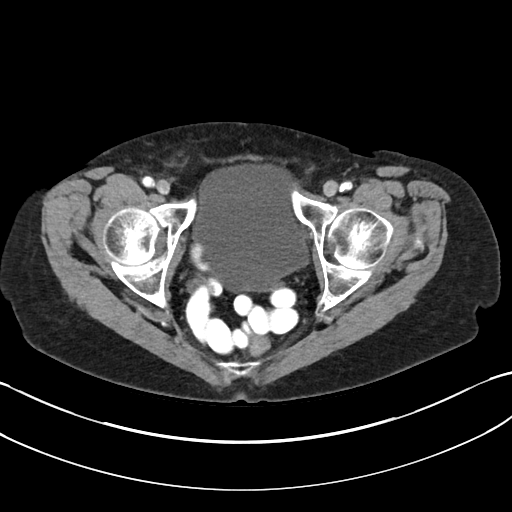
[im 22/78  soft-tissue]
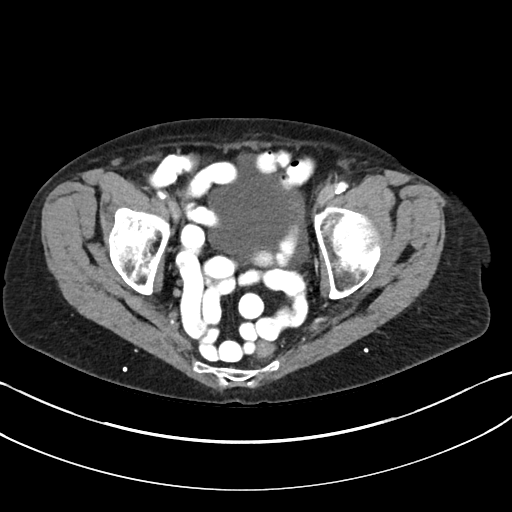
[im 26/78  soft-tissue]
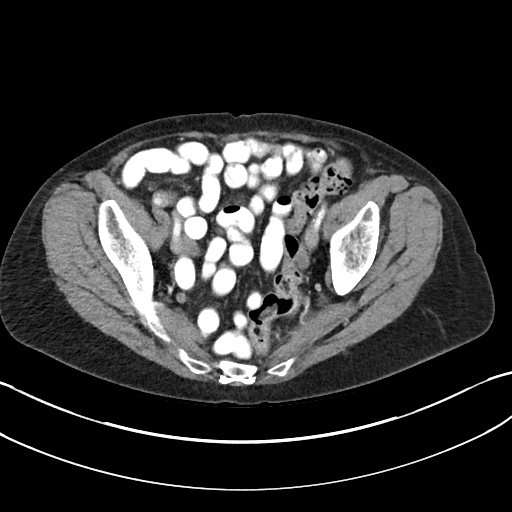
[im 35/78  soft-tissue]
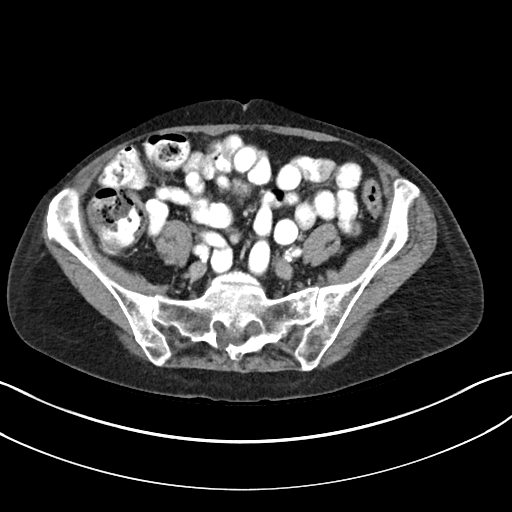
[im 39/78  soft-tissue]
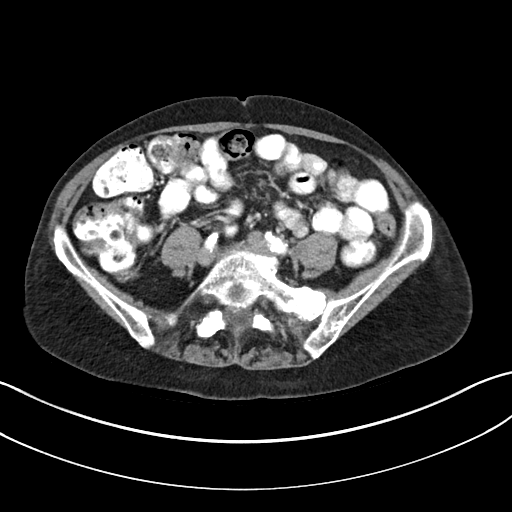
[im 43/78  soft-tissue]
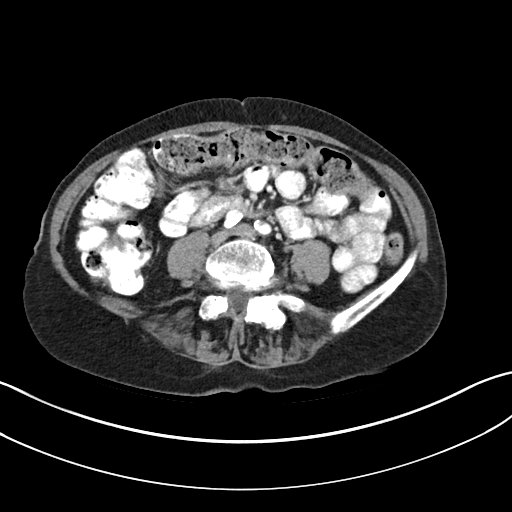
[im 52/78  soft-tissue]
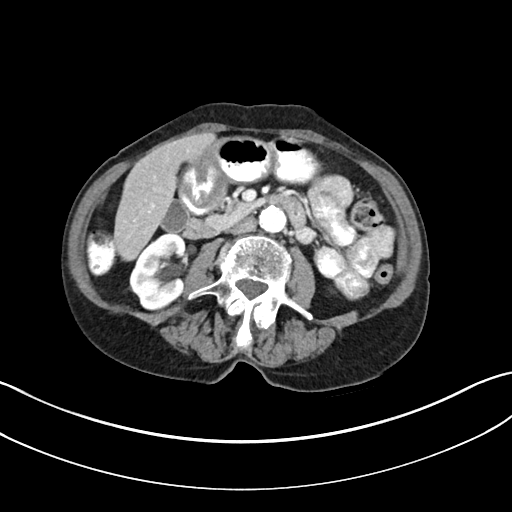
[im 52/78  bone]
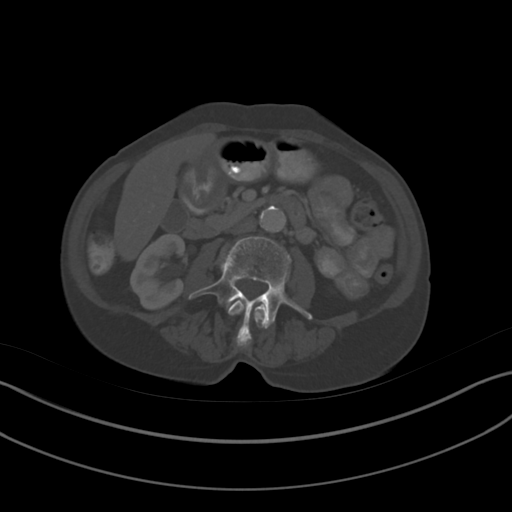
[im 56/78  soft-tissue]
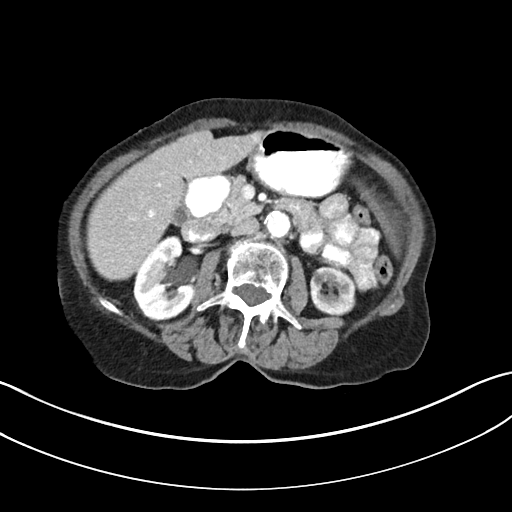
[im 60/78  soft-tissue]
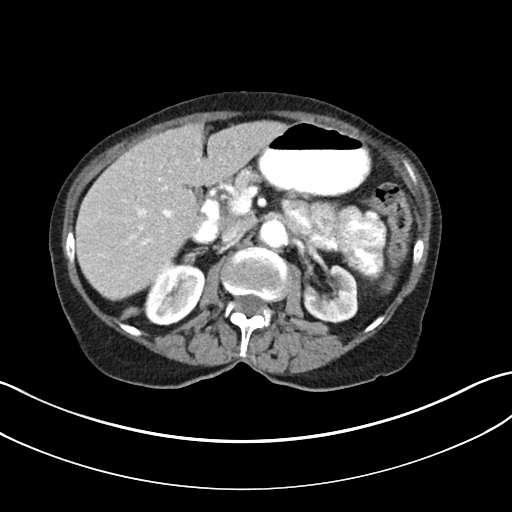
[im 69/78  soft-tissue]
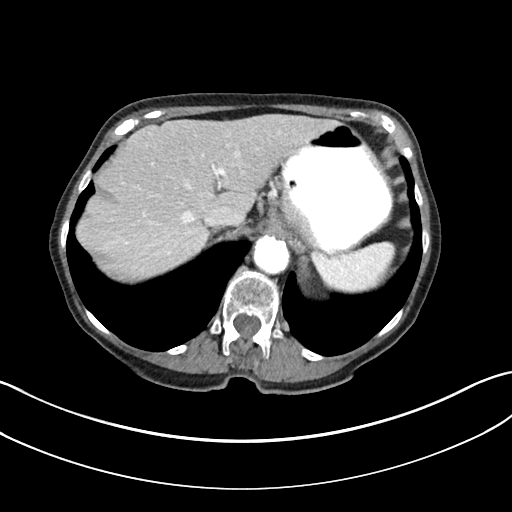
[im 73/78  soft-tissue]
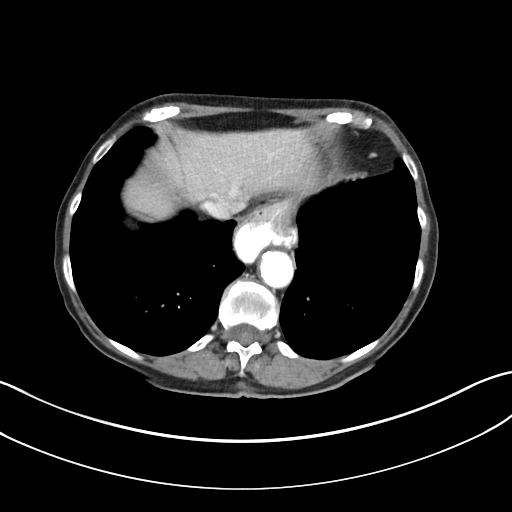

[Series 3: coronal abd pelvis · coronal · 0.72mm/px · 3 of 116 slices shown]
[im 39/116  soft-tissue]
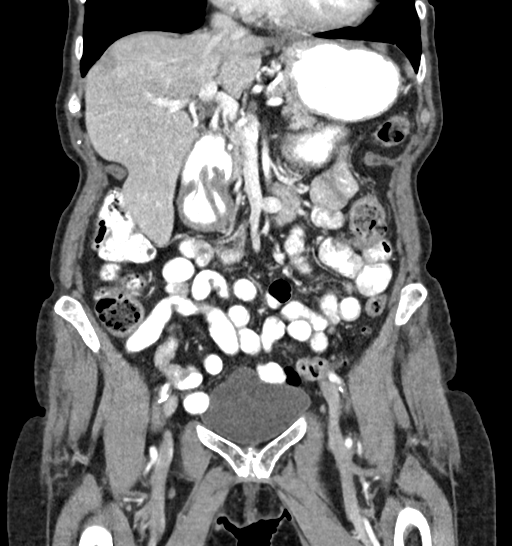
[im 52/116  soft-tissue]
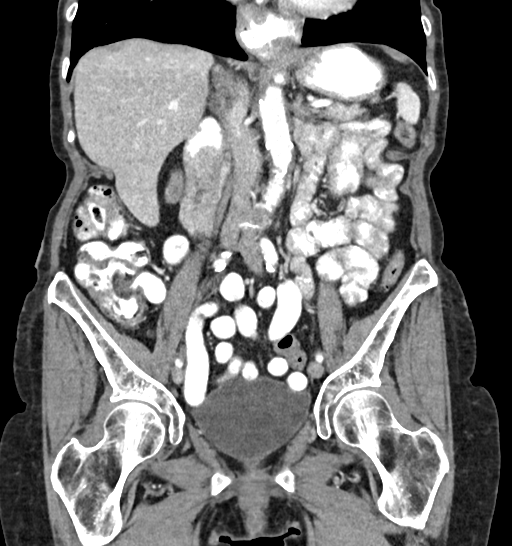
[im 64/116  soft-tissue]
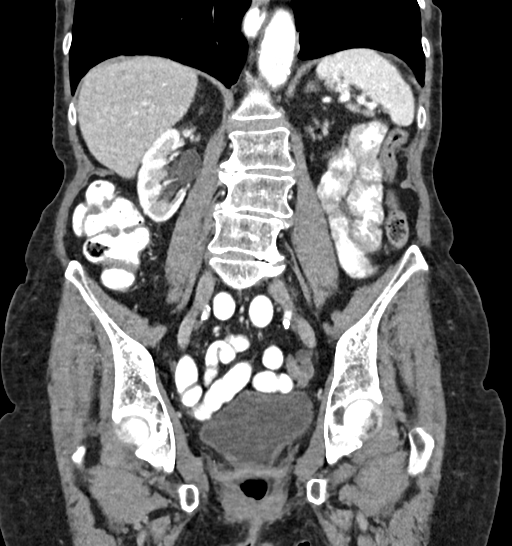

[16 of 46 positions shown; findings below may reference images not displayed]

FINDINGS: Lower chest: Mild peribronchial thickening and minimal bullous
changes at both lung bases. Minimal linear atelectasis or scarring
at both lung bases. Small to moderate-sized right posterior
diaphragmatic hernia with herniated fat.

Hepatobiliary: Mild diffuse low density of the liver relative to the
spleen. Normal appearing gallbladder.

Pancreas: Unremarkable. No pancreatic ductal dilatation or
surrounding inflammatory changes.

Spleen: Normal in size without focal abnormality.

Adrenals/Urinary Tract: Small left kidney with decreased perfusion
compared to the right kidney. Normal appearing adrenal glands,
ureters and urinary bladder.

Stomach/Bowel: Small number of colonic diverticula without evidence
of diverticulitis. Surgically absent appendix. Moderate-sized hiatal
hernia. Unremarkable small bowel.

Vascular/Lymphatic: Extensive atheromatous arterial calcifications.
These include the origin of the left renal artery with severe
stenosis.

Reproductive: Status post hysterectomy. No adnexal masses.

Other: Small to moderate-sized proximal right inguinal hernia
containing fat. The herniated fat measures 2.7 cm in maximum
diameter.

Musculoskeletal: Lumbar and lower thoracic spine degenerative
changes and mild scoliosis.
IMPRESSION: 1. Small to moderate-sized proximal right inguinal hernia containing
fat.
2. Moderate-sized hiatal hernia.
3. Mild hepatic steatosis. Marked left renal artery stenosis with
associated left renal atrophy and decreased perfusion.
4. Minimal changes of COPD and chronic bronchitis.
5. Small to moderate-sized right posterior diaphragmatic hernia with
herniated fat.

## 2018-11-21 MED ORDER — IOHEXOL 300 MG/ML  SOLN
75.0000 mL | Freq: Once | INTRAMUSCULAR | Status: AC | PRN
  Administered 2018-11-21: 60 mL via INTRAVENOUS

## 2018-11-25 ENCOUNTER — Ambulatory Visit: Payer: Self-pay | Admitting: General Surgery

## 2018-11-25 DIAGNOSIS — K4090 Unilateral inguinal hernia, without obstruction or gangrene, not specified as recurrent: Secondary | ICD-10-CM | POA: Diagnosis not present

## 2018-11-25 NOTE — H&P (Signed)
PATIENT PROFILE: Amy Hensley is a 85 y.o. female who presents to the Clinic for consultation at the request of Dr. Caryl Hensley for evaluation of inguinal hernia.  PCP:  Amy Cockayne, MD  HISTORY OF PRESENT ILLNESS: Amy Hensley reports feeling a right inguinal lump since 2 weeks ago.  She reports that it has been increasing significantly in this last week.  Patient reports discomfort especially when she is walking and standing.  The pain improves with resting and laying down.  There is no pain radiation to other part of the body.  Denies any abdominal distention nausea or vomiting.   PROBLEM LIST:         Problem List  Date Reviewed: 11/17/2018         Noted   CKD (chronic kidney disease) stage 3, GFR 30-59 ml/min (CMS-HCC) 08/19/2017   Acquired hypothyroidism, unspecified 08/06/2016   Senile purpura (CMS-HCC) 08/06/2016   Blood pressure elevated without history of HTN 07/04/2015   Tobacco abuse 07/04/2015   Anxiety Unknown   Overview    mild      Hyperlipidemia Unknown   Osteoporosis, post-menopausal Unknown   Overview    DEXA scan 2010 revealing T-score -2.6 at femoral neck.  Declined Reclast due to concerns over side effects.  Evaluated by endocrinology 5/17; patient again declines treatment      Vitamin D deficiency Unknown   History of palpitations Unknown   Overview    remotely. Reports previous cardiac work-up approximately 2000, negative.      Insomnia Unknown      GENERAL REVIEW OF SYSTEMS:   General ROS: negative for - chills, fatigue, fever, weight gain or weight loss Allergy and Immunology ROS: negative for - hives  Hematological and Lymphatic ROS: negative for - bleeding problems or bruising, negative for palpable nodes Endocrine ROS: negative for - heat or cold intolerance, hair changes Respiratory ROS: negative for - cough, shortness of breath or wheezing Cardiovascular ROS: no chest pain or palpitations GI ROS: negative for  nausea, vomiting, abdominal pain, diarrhea, constipation Musculoskeletal ROS: negative for - joint swelling or muscle pain Neurological ROS: negative for - confusion, syncope Dermatological ROS: negative for pruritus and rash Psychiatric: negative for anxiety, depression, difficulty sleeping and memory loss  MEDICATIONS: CurrentMedications        Current Outpatient Medications  Medication Sig Dispense Refill  . amLODIPine (NORVASC) 10 MG tablet Take 1 tablet (10 mg total) by mouth once daily 30 tablet 11  . calcium carbonate-vitamin D3 (CALCIUM 600 + D,3,) 600 mg(1,510m) -400 unit tablet Take 1 tablet by mouth once daily.      . cholecalciferol (VITAMIN D3) 1,000 unit capsule Take 2,000 Units by mouth once daily. Takes occasionally     . levothyroxine (SYNTHROID, LEVOTHROID) 50 MCG tablet Take 1 tablet (50 mcg total) by mouth once daily Take on an empty stomach with a glass of water at least 30-60 minutes before breakfast. 90 tablet 1  . melatonin 5 mg Cap Take 5 mg by mouth nightly.     No current facility-administered medications for this visit.       ALLERGIES: Buspirone and Fosamax [alendronate]  PAST MEDICAL HISTORY:     Past Medical History:  Diagnosis Date  . Acquired hypothyroidism, unspecified 08/06/2016  . Anxiety    mild  . Elevated BP   . History of palpitations    remotely. Reports previous cardiac work-up approximately 2000, negative.  .Marland KitchenHistory of tobacco abuse   . Hyperlipidemia   .  Insomnia   . Osteoporosis, post-menopausal    DEXA scan 2010 revealing T-score -2.6 at femoral neck.  Declined Reclast due to concerns over side effects  . Vitamin D deficiency     PAST SURGICAL HISTORY:      Past Surgical History:  Procedure Laterality Date  . APPENDECTOMY  1950's  . CATARACT EXTRACTION Right   . CESAREAN SECTION     x2  . HYSTERECTOMY  1974   for benign tumor.  Reports one ovary and portion of another removed at that  time.     FAMILY HISTORY:      Family History  Problem Relation Age of Onset  . Coronary Artery Disease (Blocked arteries around heart) Sister   . Coronary Artery Disease (Blocked arteries around heart) Brother      SOCIAL HISTORY: Social History          Socioeconomic History  . Marital status: Married    Spouse name: Not on file  . Number of children: Not on file  . Years of education: Not on file  . Highest education level: Not on file  Occupational History  . Not on file  Social Needs  . Financial resource strain: Not on file  . Food insecurity:    Worry: Not on file    Inability: Not on file  . Transportation needs:    Medical: Not on file    Non-medical: Not on file  Tobacco Use  . Smoking status: Current Every Day Smoker    Packs/day: 0.50  . Smokeless tobacco: Never Used  Substance and Sexual Activity  . Alcohol use: Yes    Comment: rare use  . Drug use: Not on file  . Sexual activity: Not on file  Other Topics Concern  . Not on file  Social History Narrative  . Not on file      PHYSICAL EXAM:    Vitals:   11/25/18 1110  BP: (!) 186/92  Pulse: 92   Body mass index is 21.22 kg/m. Weight: 52.6 kg (116 lb)   GENERAL: Alert, active, oriented x3  HEENT: Pupils equal reactive to light. Extraocular movements are intact. Sclera clear. Palpebral conjunctiva normal red color.Pharynx clear.  NECK: Supple with no palpable mass and no adenopathy.  LUNGS: Sound clear with no rales rhonchi or wheezes.  HEART: Regular rhythm S1 and S2 without murmur.  ABDOMEN: Soft and depressible, nontender with no palpable mass, no hepatomegaly.  Right reducible ventral hernia identified on standing position.  Minimal discomfort upon reduction.  EXTREMITIES: Well-developed well-nourished symmetrical with no dependent edema.  NEUROLOGICAL: Awake alert oriented, facial expression symmetrical, moving all extremities.  REVIEW OF DATA: I  have reviewed the following data today:      Office Visit on 10/07/2018  Component Date Value  . Color 10/07/2018 Yellow   . Clarity 10/07/2018 Clear   . Specific Gravity 10/07/2018 1.025   . pH, Urine 10/07/2018 5.5   . Protein, Urinalysis 10/07/2018 Negative   . Glucose, Urinalysis 10/07/2018 Negative   . Ketones, Urinalysis 10/07/2018 Negative   . Blood, Urinalysis 10/07/2018 Small*  . Nitrite, Urinalysis 10/07/2018 Negative   . Leukocyte Esterase, Urin* 10/07/2018 Negative   . White Blood Cells, Urina* 10/07/2018 0-3   . Red Blood Cells, Urinaly* 10/07/2018 0-3   . Bacteria, Urinalysis 10/07/2018 Rare*  . Squamous Epithelial Cell* 10/07/2018 Few   . Urine Culture, Routine -* 10/07/2018 Final report   . Result 1 - [facility] 10/07/2018 Comment   Appointment on 10/01/2018  Component Date Value  . Glucose 10/01/2018 92   . Sodium 10/01/2018 140   . Potassium 10/01/2018 4.7   . Chloride 10/01/2018 107   . Carbon Dioxide (CO2) 10/01/2018 27.2   . Urea Nitrogen (BUN) 10/01/2018 26*  . Creatinine 10/01/2018 1.4*  . Glomerular Filtration Ra* 10/01/2018 36*  . Calcium 10/01/2018 9.7   . AST  10/01/2018 16   . ALT  10/01/2018 9   . Alk Phos (alkaline Phosp* 10/01/2018 80   . Albumin 10/01/2018 3.9   . Bilirubin, Total 10/01/2018 0.5   . Protein, Total 10/01/2018 7.2   . A/G Ratio 10/01/2018 1.2   . WBC (White Blood Cell Co* 10/01/2018 8.8   . RBC (Red Blood Cell Coun* 10/01/2018 3.89*  . Hemoglobin 10/01/2018 12.3   . Hematocrit 10/01/2018 37.7   . MCV (Mean Corpuscular Vo* 10/01/2018 96.9   . MCH (Mean Corpuscular He* 10/01/2018 31.6*  . MCHC (Mean Corpuscular H* 10/01/2018 32.6   . Platelet Count 10/01/2018 298   . RDW-CV (Red Cell Distrib* 10/01/2018 13.2   . MPV (Mean Platelet Volum* 10/01/2018 10.7   . Neutrophils 10/01/2018 5.62   . Lymphocytes 10/01/2018 2.22   . Monocytes 10/01/2018 0.63   . Eosinophils 10/01/2018 0.22   . Basophils 10/01/2018 0.11*  . Neutrophil %  10/01/2018 63.8   . Lymphocyte % 10/01/2018 25.2   . Monocyte % 10/01/2018 7.1   . Eosinophil % 10/01/2018 2.5   . Basophil% 10/01/2018 1.2   . Immature Granulocyte % 10/01/2018 0.2   . Immature Granulocyte Cou* 10/01/2018 0.02   . Cholesterol, Total 10/01/2018 179   . Triglyceride 10/01/2018 100   . HDL (High Density Lipopr* 10/01/2018 57.7   . LDL Calculated 10/01/2018 101   . VLDL Cholesterol 10/01/2018 20   . Cholesterol/HDL Ratio 10/01/2018 3.1   . Creatinine, Random Urine 10/01/2018 141.8   . Urine Albumin, Random 10/01/2018 9   . Urine Albumin/Creatinine* 10/01/2018 6.3   . Color 10/01/2018 Yellow   . Clarity 10/01/2018 Clear   . Specific Gravity 10/01/2018 1.025   . pH, Urine 10/01/2018 6.0   . Protein, Urinalysis 10/01/2018 Negative   . Glucose, Urinalysis 10/01/2018 Negative   . Ketones, Urinalysis 10/01/2018 Negative   . Blood, Urinalysis 10/01/2018 Small*  . Nitrite, Urinalysis 10/01/2018 Negative   . Leukocyte Esterase, Urin* 10/01/2018 Negative   . White Blood Cells, Urina* 10/01/2018 0-3   . Red Blood Cells, Urinaly* 10/01/2018 4-10*  . Bacteria, Urinalysis 10/01/2018 None Seen   . Squamous Epithelial Cell* 10/01/2018 Rare   . Thyroid Stimulating Horm* 10/01/2018 5.004      ASSESSMENT: Ms. Stoffel is a 85 y.o. female presenting for consultation for right inguinal hernia.    The patient presents with a symptomatic, reducible right inguinal hernia. Patient was oriented about the diagnosis of inguinal hernia and its implication. The patient was oriented about the treatment alternatives (observation vs surgical repair). Due to patient symptoms and because it is getting bigger quickly, repair is recommended. Patient oriented about the surgical procedure, the use of mesh and its risk of complications such as: infection, bleeding, injury to, vasculature, injury to bowel or bladder, and chronic pain.  Non-recurrent unilateral inguinal hernia without obstruction or  gangrene [K40.90]  PLAN: 1.  Right inguinal hernia repair with mesh (92010) 2.  CBC, CMP done on 10/01/2018. 3.  Avoid taking aspirin 5 days before the surgery. 4.  Contact us if you have any question concern  Patient, her husband  and her daughter verbalized understanding, all questions were answered, and were agreeable with the plan outlined above.    Herbert Pun, MD  Electronically signed by Herbert Pun, MD

## 2018-11-25 NOTE — H&P (View-Only) (Signed)
PATIENT PROFILE: DALISSA LOVIN is a 85 y.o. female who presents to the Clinic for consultation at the request of Dr. Caryl Comes for evaluation of inguinal hernia.  PCP:  Cheryll Cockayne, MD  HISTORY OF PRESENT ILLNESS: Ms. Blocher reports feeling a right inguinal lump since 2 weeks ago.  She reports that it has been increasing significantly in this last week.  Patient reports discomfort especially when she is walking and standing.  The pain improves with resting and laying down.  There is no pain radiation to other part of the body.  Denies any abdominal distention nausea or vomiting.   PROBLEM LIST:         Problem List  Date Reviewed: 11/17/2018         Noted   CKD (chronic kidney disease) stage 3, GFR 30-59 ml/min (CMS-HCC) 08/19/2017   Acquired hypothyroidism, unspecified 08/06/2016   Senile purpura (CMS-HCC) 08/06/2016   Blood pressure elevated without history of HTN 07/04/2015   Tobacco abuse 07/04/2015   Anxiety Unknown   Overview    mild      Hyperlipidemia Unknown   Osteoporosis, post-menopausal Unknown   Overview    DEXA scan 2010 revealing T-score -2.6 at femoral neck.  Declined Reclast due to concerns over side effects.  Evaluated by endocrinology 5/17; patient again declines treatment      Vitamin D deficiency Unknown   History of palpitations Unknown   Overview    remotely. Reports previous cardiac work-up approximately 2000, negative.      Insomnia Unknown      GENERAL REVIEW OF SYSTEMS:   General ROS: negative for - chills, fatigue, fever, weight gain or weight loss Allergy and Immunology ROS: negative for - hives  Hematological and Lymphatic ROS: negative for - bleeding problems or bruising, negative for palpable nodes Endocrine ROS: negative for - heat or cold intolerance, hair changes Respiratory ROS: negative for - cough, shortness of breath or wheezing Cardiovascular ROS: no chest pain or palpitations GI ROS: negative for  nausea, vomiting, abdominal pain, diarrhea, constipation Musculoskeletal ROS: negative for - joint swelling or muscle pain Neurological ROS: negative for - confusion, syncope Dermatological ROS: negative for pruritus and rash Psychiatric: negative for anxiety, depression, difficulty sleeping and memory loss  MEDICATIONS: CurrentMedications        Current Outpatient Medications  Medication Sig Dispense Refill  . amLODIPine (NORVASC) 10 MG tablet Take 1 tablet (10 mg total) by mouth once daily 30 tablet 11  . calcium carbonate-vitamin D3 (CALCIUM 600 + D,3,) 600 mg(1,578m) -400 unit tablet Take 1 tablet by mouth once daily.      . cholecalciferol (VITAMIN D3) 1,000 unit capsule Take 2,000 Units by mouth once daily. Takes occasionally     . levothyroxine (SYNTHROID, LEVOTHROID) 50 MCG tablet Take 1 tablet (50 mcg total) by mouth once daily Take on an empty stomach with a glass of water at least 30-60 minutes before breakfast. 90 tablet 1  . melatonin 5 mg Cap Take 5 mg by mouth nightly.     No current facility-administered medications for this visit.       ALLERGIES: Buspirone and Fosamax [alendronate]  PAST MEDICAL HISTORY:     Past Medical History:  Diagnosis Date  . Acquired hypothyroidism, unspecified 08/06/2016  . Anxiety    mild  . Elevated BP   . History of palpitations    remotely. Reports previous cardiac work-up approximately 2000, negative.  .Marland KitchenHistory of tobacco abuse   . Hyperlipidemia   .  Insomnia   . Osteoporosis, post-menopausal    DEXA scan 2010 revealing T-score -2.6 at femoral neck.  Declined Reclast due to concerns over side effects  . Vitamin D deficiency     PAST SURGICAL HISTORY:      Past Surgical History:  Procedure Laterality Date  . APPENDECTOMY  1950's  . CATARACT EXTRACTION Right   . CESAREAN SECTION     x2  . HYSTERECTOMY  1974   for benign tumor.  Reports one ovary and portion of another removed at that  time.     FAMILY HISTORY:      Family History  Problem Relation Age of Onset  . Coronary Artery Disease (Blocked arteries around heart) Sister   . Coronary Artery Disease (Blocked arteries around heart) Brother      SOCIAL HISTORY: Social History          Socioeconomic History  . Marital status: Married    Spouse name: Not on file  . Number of children: Not on file  . Years of education: Not on file  . Highest education level: Not on file  Occupational History  . Not on file  Social Needs  . Financial resource strain: Not on file  . Food insecurity:    Worry: Not on file    Inability: Not on file  . Transportation needs:    Medical: Not on file    Non-medical: Not on file  Tobacco Use  . Smoking status: Current Every Day Smoker    Packs/day: 0.50  . Smokeless tobacco: Never Used  Substance and Sexual Activity  . Alcohol use: Yes    Comment: rare use  . Drug use: Not on file  . Sexual activity: Not on file  Other Topics Concern  . Not on file  Social History Narrative  . Not on file      PHYSICAL EXAM:    Vitals:   11/25/18 1110  BP: (!) 186/92  Pulse: 92   Body mass index is 21.22 kg/m. Weight: 52.6 kg (116 lb)   GENERAL: Alert, active, oriented x3  HEENT: Pupils equal reactive to light. Extraocular movements are intact. Sclera clear. Palpebral conjunctiva normal red color.Pharynx clear.  NECK: Supple with no palpable mass and no adenopathy.  LUNGS: Sound clear with no rales rhonchi or wheezes.  HEART: Regular rhythm S1 and S2 without murmur.  ABDOMEN: Soft and depressible, nontender with no palpable mass, no hepatomegaly.  Right reducible ventral hernia identified on standing position.  Minimal discomfort upon reduction.  EXTREMITIES: Well-developed well-nourished symmetrical with no dependent edema.  NEUROLOGICAL: Awake alert oriented, facial expression symmetrical, moving all extremities.  REVIEW OF DATA: I  have reviewed the following data today:      Office Visit on 10/07/2018  Component Date Value  . Color 10/07/2018 Yellow   . Clarity 10/07/2018 Clear   . Specific Gravity 10/07/2018 1.025   . pH, Urine 10/07/2018 5.5   . Protein, Urinalysis 10/07/2018 Negative   . Glucose, Urinalysis 10/07/2018 Negative   . Ketones, Urinalysis 10/07/2018 Negative   . Blood, Urinalysis 10/07/2018 Small*  . Nitrite, Urinalysis 10/07/2018 Negative   . Leukocyte Esterase, Urin* 10/07/2018 Negative   . White Blood Cells, Urina* 10/07/2018 0-3   . Red Blood Cells, Urinaly* 10/07/2018 0-3   . Bacteria, Urinalysis 10/07/2018 Rare*  . Squamous Epithelial Cell* 10/07/2018 Few   . Urine Culture, Routine -* 10/07/2018 Final report   . Result 1 - [facility] 10/07/2018 Comment   Appointment on 10/01/2018  Component Date Value  . Glucose 10/01/2018 92   . Sodium 10/01/2018 140   . Potassium 10/01/2018 4.7   . Chloride 10/01/2018 107   . Carbon Dioxide (CO2) 10/01/2018 27.2   . Urea Nitrogen (BUN) 10/01/2018 26*  . Creatinine 10/01/2018 1.4*  . Glomerular Filtration Ra* 10/01/2018 36*  . Calcium 10/01/2018 9.7   . AST  10/01/2018 16   . ALT  10/01/2018 9   . Alk Phos (alkaline Phosp* 10/01/2018 80   . Albumin 10/01/2018 3.9   . Bilirubin, Total 10/01/2018 0.5   . Protein, Total 10/01/2018 7.2   . A/G Ratio 10/01/2018 1.2   . WBC (White Blood Cell Co* 10/01/2018 8.8   . RBC (Red Blood Cell Coun* 10/01/2018 3.89*  . Hemoglobin 10/01/2018 12.3   . Hematocrit 10/01/2018 37.7   . MCV (Mean Corpuscular Vo* 10/01/2018 96.9   . MCH (Mean Corpuscular He* 10/01/2018 31.6*  . MCHC (Mean Corpuscular H* 10/01/2018 32.6   . Platelet Count 10/01/2018 298   . RDW-CV (Red Cell Distrib* 10/01/2018 13.2   . MPV (Mean Platelet Volum* 10/01/2018 10.7   . Neutrophils 10/01/2018 5.62   . Lymphocytes 10/01/2018 2.22   . Monocytes 10/01/2018 0.63   . Eosinophils 10/01/2018 0.22   . Basophils 10/01/2018 0.11*  . Neutrophil %  10/01/2018 63.8   . Lymphocyte % 10/01/2018 25.2   . Monocyte % 10/01/2018 7.1   . Eosinophil % 10/01/2018 2.5   . Basophil% 10/01/2018 1.2   . Immature Granulocyte % 10/01/2018 0.2   . Immature Granulocyte Cou* 10/01/2018 0.02   . Cholesterol, Total 10/01/2018 179   . Triglyceride 10/01/2018 100   . HDL (High Density Lipopr* 10/01/2018 57.7   . LDL Calculated 10/01/2018 101   . VLDL Cholesterol 10/01/2018 20   . Cholesterol/HDL Ratio 10/01/2018 3.1   . Creatinine, Random Urine 10/01/2018 141.8   . Urine Albumin, Random 10/01/2018 9   . Urine Albumin/Creatinine* 10/01/2018 6.3   . Color 10/01/2018 Yellow   . Clarity 10/01/2018 Clear   . Specific Gravity 10/01/2018 1.025   . pH, Urine 10/01/2018 6.0   . Protein, Urinalysis 10/01/2018 Negative   . Glucose, Urinalysis 10/01/2018 Negative   . Ketones, Urinalysis 10/01/2018 Negative   . Blood, Urinalysis 10/01/2018 Small*  . Nitrite, Urinalysis 10/01/2018 Negative   . Leukocyte Esterase, Urin* 10/01/2018 Negative   . White Blood Cells, Urina* 10/01/2018 0-3   . Red Blood Cells, Urinaly* 10/01/2018 4-10*  . Bacteria, Urinalysis 10/01/2018 None Seen   . Squamous Epithelial Cell* 10/01/2018 Rare   . Thyroid Stimulating Horm* 10/01/2018 5.004      ASSESSMENT: Ms. Wyble is a 85 y.o. female presenting for consultation for right inguinal hernia.    The patient presents with a symptomatic, reducible right inguinal hernia. Patient was oriented about the diagnosis of inguinal hernia and its implication. The patient was oriented about the treatment alternatives (observation vs surgical repair). Due to patient symptoms and because it is getting bigger quickly, repair is recommended. Patient oriented about the surgical procedure, the use of mesh and its risk of complications such as: infection, bleeding, injury to, vasculature, injury to bowel or bladder, and chronic pain.  Non-recurrent unilateral inguinal hernia without obstruction or  gangrene [K40.90]  PLAN: 1.  Right inguinal hernia repair with mesh (01007) 2.  CBC, CMP done on 10/01/2018. 3.  Avoid taking aspirin 5 days before the surgery. 4.  Contact us if you have any question concern  Patient, her husband  and her daughter verbalized understanding, all questions were answered, and were agreeable with the plan outlined above.    Herbert Pun, MD  Electronically signed by Herbert Pun, MD

## 2018-12-02 ENCOUNTER — Encounter
Admission: RE | Admit: 2018-12-02 | Discharge: 2018-12-02 | Disposition: A | Discharge status: Home / Self Care | Payer: PPO | Source: Ambulatory Visit | Attending: General Surgery | Admitting: General Surgery

## 2018-12-02 ENCOUNTER — Other Ambulatory Visit: Payer: Self-pay

## 2018-12-02 DIAGNOSIS — Z20828 Contact with and (suspected) exposure to other viral communicable diseases: Secondary | ICD-10-CM | POA: Insufficient documentation

## 2018-12-02 DIAGNOSIS — I10 Essential (primary) hypertension: Secondary | ICD-10-CM | POA: Diagnosis not present

## 2018-12-02 DIAGNOSIS — Z01810 Encounter for preprocedural cardiovascular examination: Secondary | ICD-10-CM | POA: Diagnosis not present

## 2018-12-02 HISTORY — DX: Chronic kidney disease, unspecified: N18.9

## 2018-12-02 HISTORY — DX: Unspecified osteoarthritis, unspecified site: M19.90

## 2018-12-02 HISTORY — DX: Essential (primary) hypertension: I10

## 2018-12-02 NOTE — Patient Instructions (Signed)
Your procedure is scheduled on: 12/08/18 Report to Day Surgery. [facility]OND FLOOR To find out your arrival time please call [phone] between 1PM - 3PM on 12/05/18.  Remember: Instructions that are not followed completely may result in serious medical risk,  up to and including death, or upon the discretion of your surgeon and anesthesiologist your  surgery may need to be rescheduled.     _X__ 1. Do not eat food after midnight the night before your procedure.                 No gum chewing or hard candies. You may drink clear liquids up to 2 hours                 before you are scheduled to arrive for your surgery- DO not drink clear                 liquids within 2 hours of the start of your surgery.                 Clear Liquids include:  water, apple juice without pulp, clear carbohydrate                 drink such as Clearfast of Gatorade, Black Coffee or Tea (Do not add                 anything to coffee or tea).  __X__2.  On the morning of surgery brush your teeth with toothpaste and water, you                may rinse your mouth with mouthwash if you wish.  Do not swallow any toothpaste of mouthwash.     _X__ 3.  No Alcohol for 24 hours before or after surgery.   _X__ 4.  Do Not Smoke or use e-cigarettes For 24 Hours Prior to Your Surgery.                 Do not use any chewable tobacco products for at least 6 hours prior to                 surgery.  ____  5.  Bring all medications with you on the day of surgery if instructed.   __X__  6.  Notify your doctor if there is any change in your medical condition      (cold, fever, infections).     Do not wear jewelry, make-up, hairpins, clips or nail polish. Do not wear lotions, powders, or perfumes. You may wear deodorant. Do not shave 48 hours prior to surgery. Men may shave face and neck. Do not bring valuables to the hospital.    [facility] is not responsible for any belongings or  valuables.  Contacts, dentures or bridgework may not be worn into surgery. Leave your suitcase in the car. After surgery it may be brought to your room. For patients admitted to the hospital, discharge time is determined by your treatment team.   Patients discharged the day of surgery will not be allowed to drive home.   Please read over the following fact sheets that you were given:   Surgical Site Infection Prevention          __X__ Take these medicines the morning of surgery with A SIP OF WATER:    1. LEVOTHYROXINE  2.   3.   4.  5.  6.  ____ Fleet Enema (as directed)  _X__ Use CHG Soap as directed  ____ Use inhalers on the day of surgery  ____ Stop metformin 2 days prior to surgery    ____ Take 1/2 of usual insulin dose the night before surgery. No insulin the morning          of surgery.   ____ Stop Coumadin/Plavix/aspirin on   ____ Stop Anti-inflammatories on    ____ Stop supplements until after surgery.    ____ Bring C-Pap to the hospital.

## 2018-12-04 ENCOUNTER — Other Ambulatory Visit
Admission: RE | Admit: 2018-12-04 | Discharge: 2018-12-04 | Disposition: A | Discharge status: Home / Self Care | Payer: PPO | Source: Ambulatory Visit | Attending: General Surgery | Admitting: General Surgery

## 2018-12-04 ENCOUNTER — Other Ambulatory Visit: Payer: Self-pay

## 2018-12-04 DIAGNOSIS — Z01810 Encounter for preprocedural cardiovascular examination: Secondary | ICD-10-CM | POA: Diagnosis not present

## 2018-12-04 LAB — SARS CORONAVIRUS 2 (TAT 6-24 HRS): SARS Coronavirus 2: NEGATIVE

## 2018-12-07 MED ORDER — CEFAZOLIN SODIUM-DEXTROSE 2-4 GM/100ML-% IV SOLN
2.0000 g | INTRAVENOUS | Status: AC
  Administered 2018-12-08: 2 g via INTRAVENOUS

## 2018-12-08 ENCOUNTER — Encounter
Admission: RE | Disposition: A | Discharge status: Home / Self Care | Payer: Self-pay | Source: Ambulatory Visit | Attending: General Surgery

## 2018-12-08 ENCOUNTER — Other Ambulatory Visit: Payer: Self-pay

## 2018-12-08 ENCOUNTER — Encounter: Payer: Self-pay | Admitting: *Deleted

## 2018-12-08 ENCOUNTER — Ambulatory Visit: Payer: PPO | Admitting: Certified Registered"

## 2018-12-08 ENCOUNTER — Ambulatory Visit
Admission: RE | Admit: 2018-12-08 | Discharge: 2018-12-08 | Disposition: A | Discharge status: Home / Self Care | Payer: PPO | Source: Ambulatory Visit | Attending: General Surgery | Admitting: General Surgery

## 2018-12-08 DIAGNOSIS — R030 Elevated blood-pressure reading, without diagnosis of hypertension: Secondary | ICD-10-CM | POA: Insufficient documentation

## 2018-12-08 DIAGNOSIS — F419 Anxiety disorder, unspecified: Secondary | ICD-10-CM | POA: Insufficient documentation

## 2018-12-08 DIAGNOSIS — Z79890 Hormone replacement therapy (postmenopausal): Secondary | ICD-10-CM | POA: Diagnosis not present

## 2018-12-08 DIAGNOSIS — F17210 Nicotine dependence, cigarettes, uncomplicated: Secondary | ICD-10-CM | POA: Insufficient documentation

## 2018-12-08 DIAGNOSIS — D1779 Benign lipomatous neoplasm of other sites: Secondary | ICD-10-CM | POA: Diagnosis not present

## 2018-12-08 DIAGNOSIS — E669 Obesity, unspecified: Secondary | ICD-10-CM | POA: Diagnosis not present

## 2018-12-08 DIAGNOSIS — N183 Chronic kidney disease, stage 3 (moderate): Secondary | ICD-10-CM | POA: Insufficient documentation

## 2018-12-08 DIAGNOSIS — D1739 Benign lipomatous neoplasm of skin and subcutaneous tissue of other sites: Secondary | ICD-10-CM | POA: Diagnosis not present

## 2018-12-08 DIAGNOSIS — E785 Hyperlipidemia, unspecified: Secondary | ICD-10-CM | POA: Diagnosis not present

## 2018-12-08 DIAGNOSIS — I10 Essential (primary) hypertension: Secondary | ICD-10-CM | POA: Diagnosis not present

## 2018-12-08 DIAGNOSIS — M810 Age-related osteoporosis without current pathological fracture: Secondary | ICD-10-CM | POA: Diagnosis not present

## 2018-12-08 DIAGNOSIS — E559 Vitamin D deficiency, unspecified: Secondary | ICD-10-CM | POA: Diagnosis not present

## 2018-12-08 DIAGNOSIS — Z79899 Other long term (current) drug therapy: Secondary | ICD-10-CM | POA: Diagnosis not present

## 2018-12-08 DIAGNOSIS — E039 Hypothyroidism, unspecified: Secondary | ICD-10-CM | POA: Insufficient documentation

## 2018-12-08 DIAGNOSIS — K4090 Unilateral inguinal hernia, without obstruction or gangrene, not specified as recurrent: Secondary | ICD-10-CM | POA: Diagnosis not present

## 2018-12-08 DIAGNOSIS — D692 Other nonthrombocytopenic purpura: Secondary | ICD-10-CM | POA: Insufficient documentation

## 2018-12-08 HISTORY — PX: INGUINAL HERNIA REPAIR: SHX194

## 2018-12-08 SURGERY — REPAIR, HERNIA, INGUINAL, ADULT
Anesthesia: General | Laterality: Right

## 2018-12-08 MED ORDER — LIDOCAINE HCL (CARDIAC) PF 100 MG/5ML IV SOSY
PREFILLED_SYRINGE | INTRAVENOUS | Status: DC | PRN
  Administered 2018-12-08: 80 mg via INTRAVENOUS

## 2018-12-08 MED ORDER — SUGAMMADEX SODIUM 200 MG/2ML IV SOLN
INTRAVENOUS | Status: AC
  Filled 2018-12-08: qty 6

## 2018-12-08 MED ORDER — HYDROCODONE-ACETAMINOPHEN 5-325 MG PO TABS
1.0000 | ORAL_TABLET | Freq: Once | ORAL | Status: AC
  Administered 2018-12-08: 1 via ORAL

## 2018-12-08 MED ORDER — ROCURONIUM BROMIDE 100 MG/10ML IV SOLN
INTRAVENOUS | Status: DC | PRN
  Administered 2018-12-08 (×3): 10 mg via INTRAVENOUS

## 2018-12-08 MED ORDER — SUGAMMADEX SODIUM 200 MG/2ML IV SOLN
INTRAVENOUS | Status: DC | PRN
  Administered 2018-12-08: 200 mg via INTRAVENOUS

## 2018-12-08 MED ORDER — BUPIVACAINE LIPOSOME 1.3 % IJ SUSP
INTRAMUSCULAR | Status: AC
  Filled 2018-12-08: qty 20

## 2018-12-08 MED ORDER — SUGAMMADEX SODIUM 200 MG/2ML IV SOLN
INTRAVENOUS | Status: AC
  Filled 2018-12-08: qty 2

## 2018-12-08 MED ORDER — GLYCOPYRROLATE 0.2 MG/ML IJ SOLN
INTRAMUSCULAR | Status: DC | PRN
  Administered 2018-12-08: 0.2 mg via INTRAVENOUS

## 2018-12-08 MED ORDER — BUPIVACAINE-EPINEPHRINE (PF) 0.25% -1:200000 IJ SOLN
INTRAMUSCULAR | Status: AC
  Filled 2018-12-08: qty 30

## 2018-12-08 MED ORDER — HYDROCODONE-ACETAMINOPHEN 5-325 MG PO TABS: 1.0000 | ORAL_TABLET | ORAL | 0 refills | Status: AC | PRN

## 2018-12-08 MED ORDER — FAMOTIDINE 20 MG PO TABS
20.0000 mg | ORAL_TABLET | Freq: Once | ORAL | Status: AC
  Administered 2018-12-08: 20 mg via ORAL

## 2018-12-08 MED ORDER — METOPROLOL TARTRATE 5 MG/5ML IV SOLN
INTRAVENOUS | Status: DC | PRN
  Administered 2018-12-08: 2 mg via INTRAVENOUS

## 2018-12-08 MED ORDER — PHENYLEPHRINE HCL (PRESSORS) 10 MG/ML IV SOLN
INTRAVENOUS | Status: AC
  Filled 2018-12-08: qty 1

## 2018-12-08 MED ORDER — MIDAZOLAM HCL 2 MG/2ML IJ SOLN
INTRAMUSCULAR | Status: AC
  Filled 2018-12-08: qty 2

## 2018-12-08 MED ORDER — METOPROLOL TARTRATE 5 MG/5ML IV SOLN
INTRAVENOUS | Status: AC
  Filled 2018-12-08: qty 5

## 2018-12-08 MED ORDER — SUCCINYLCHOLINE CHLORIDE 20 MG/ML IJ SOLN
INTRAMUSCULAR | Status: DC | PRN
  Administered 2018-12-08: 80 mg via INTRAVENOUS

## 2018-12-08 MED ORDER — BUPIVACAINE LIPOSOME 1.3 % IJ SUSP
INTRAMUSCULAR | Status: DC | PRN
  Administered 2018-12-08: 20 mL

## 2018-12-08 MED ORDER — ONDANSETRON HCL 4 MG/2ML IJ SOLN: 4.0000 mg | Freq: Once | INTRAMUSCULAR | Status: DC | PRN

## 2018-12-08 MED ORDER — FENTANYL CITRATE (PF) 100 MCG/2ML IJ SOLN
INTRAMUSCULAR | Status: AC
  Filled 2018-12-08: qty 2

## 2018-12-08 MED ORDER — BUPIVACAINE-EPINEPHRINE 0.25% -1:200000 IJ SOLN
INTRAMUSCULAR | Status: DC | PRN
  Administered 2018-12-08: 30 mL

## 2018-12-08 MED ORDER — HYDROCODONE-ACETAMINOPHEN 5-325 MG PO TABS
ORAL_TABLET | ORAL | Status: AC
  Filled 2018-12-08: qty 1

## 2018-12-08 MED ORDER — EPHEDRINE SULFATE 50 MG/ML IJ SOLN
INTRAMUSCULAR | Status: AC
  Filled 2018-12-08: qty 1

## 2018-12-08 MED ORDER — FENTANYL CITRATE (PF) 100 MCG/2ML IJ SOLN
INTRAMUSCULAR | Status: DC | PRN
  Administered 2018-12-08 (×2): 50 ug via INTRAVENOUS

## 2018-12-08 MED ORDER — LACTATED RINGERS IV SOLN
INTRAVENOUS | Status: DC | PRN
  Administered 2018-12-08 (×2): via INTRAVENOUS

## 2018-12-08 MED ORDER — ESMOLOL HCL 100 MG/10ML IV SOLN
INTRAVENOUS | Status: AC
  Filled 2018-12-08: qty 10

## 2018-12-08 MED ORDER — ESMOLOL HCL 100 MG/10ML IV SOLN
INTRAVENOUS | Status: DC | PRN
  Administered 2018-12-08: 20 mg via INTRAVENOUS

## 2018-12-08 MED ORDER — SODIUM CHLORIDE 0.9 % IV SOLN
INTRAVENOUS | Status: DC
  Administered 2018-12-08: 08:00:00 via INTRAVENOUS

## 2018-12-08 MED ORDER — CEFAZOLIN SODIUM-DEXTROSE 2-4 GM/100ML-% IV SOLN
INTRAVENOUS | Status: AC
  Filled 2018-12-08: qty 100

## 2018-12-08 MED ORDER — ONDANSETRON HCL 4 MG/2ML IJ SOLN
INTRAMUSCULAR | Status: DC | PRN
  Administered 2018-12-08: 4 mg via INTRAVENOUS

## 2018-12-08 MED ORDER — PROPOFOL 10 MG/ML IV BOLUS
INTRAVENOUS | Status: AC
  Filled 2018-12-08: qty 40

## 2018-12-08 MED ORDER — FAMOTIDINE 20 MG PO TABS
ORAL_TABLET | ORAL | Status: AC
  Administered 2018-12-08: 20 mg via ORAL
  Filled 2018-12-08: qty 1

## 2018-12-08 MED ORDER — DEXAMETHASONE SODIUM PHOSPHATE 10 MG/ML IJ SOLN
INTRAMUSCULAR | Status: DC | PRN
  Administered 2018-12-08: 10 mg via INTRAVENOUS

## 2018-12-08 MED ORDER — ROCURONIUM BROMIDE 100 MG/10ML IV SOLN
INTRAVENOUS | Status: AC
  Filled 2018-12-08: qty 1

## 2018-12-08 MED ORDER — PHENYLEPHRINE HCL (PRESSORS) 10 MG/ML IV SOLN
INTRAVENOUS | Status: DC | PRN
  Administered 2018-12-08 (×4): 100 ug via INTRAVENOUS

## 2018-12-08 MED ORDER — FENTANYL CITRATE (PF) 100 MCG/2ML IJ SOLN: 25.0000 ug | INTRAMUSCULAR | Status: DC | PRN

## 2018-12-08 MED ORDER — PROPOFOL 10 MG/ML IV BOLUS
INTRAVENOUS | Status: DC | PRN
  Administered 2018-12-08: 70 mg via INTRAVENOUS

## 2018-12-08 SURGICAL SUPPLY — 31 items
BLADE SURG 15 STRL LF DISP TIS (BLADE) ×1 IMPLANT
BLADE SURG 15 STRL SS (BLADE) ×2
CANISTER SUCT 1200ML W/VALVE (MISCELLANEOUS) ×3 IMPLANT
CHLORAPREP W/TINT 26 (MISCELLANEOUS) ×3 IMPLANT
COVER WAND RF STERILE (DRAPES) ×3 IMPLANT
DERMABOND ADVANCED (GAUZE/BANDAGES/DRESSINGS) ×2
DERMABOND ADVANCED .7 DNX12 (GAUZE/BANDAGES/DRESSINGS) ×1 IMPLANT
DRAIN PENROSE 1/4X12 LTX (DRAIN) ×3 IMPLANT
DRAPE LAPAROTOMY 100X77 ABD (DRAPES) ×3 IMPLANT
ELECT REM PT RETURN 9FT ADLT (ELECTROSURGICAL) ×3
ELECTRODE REM PT RTRN 9FT ADLT (ELECTROSURGICAL) ×1 IMPLANT
GLOVE BIO SURGEON STRL SZ 6.5 (GLOVE) ×4 IMPLANT
GLOVE BIO SURGEONS STRL SZ 6.5 (GLOVE) ×2
GLOVE INDICATOR 6.5 STRL GRN (GLOVE) ×5 IMPLANT
GOWN STRL REUS W/ TWL LRG LVL3 (GOWN DISPOSABLE) ×2 IMPLANT
GOWN STRL REUS W/TWL LRG LVL3 (GOWN DISPOSABLE) ×4
LABEL OR SOLS (LABEL) ×3 IMPLANT
MESH HERNIA 3X6 (Mesh General) ×2 IMPLANT
NEEDLE HYPO 22GX1.5 SAFETY (NEEDLE) ×3 IMPLANT
NS IRRIG 500ML POUR BTL (IV SOLUTION) ×3 IMPLANT
PACK BASIN MINOR ARMC (MISCELLANEOUS) ×3 IMPLANT
SUT MNCRL 4-0 (SUTURE) ×2
SUT MNCRL 4-0 27XMFL (SUTURE) ×1
SUT SURGILON 0 BLK (SUTURE) ×6 IMPLANT
SUT VIC AB 2-0 BRD 54 (SUTURE) ×3 IMPLANT
SUT VIC AB 2-0 CT2 27 (SUTURE) ×3 IMPLANT
SUT VIC AB 3-0 SH 27 (SUTURE) ×4
SUT VIC AB 3-0 SH 27X BRD (SUTURE) ×2 IMPLANT
SUTURE MNCRL 4-0 27XMF (SUTURE) ×1 IMPLANT
SYR 10ML LL (SYRINGE) ×3 IMPLANT
SYR 20ML LL LF (SYRINGE) ×2 IMPLANT

## 2018-12-08 NOTE — Transfer of Care (Signed)
Immediate Anesthesia Transfer of Care Note  Patient: AZHANE ECKART  Procedure(s) Performed: OPEN RIGHT HERNIA REPAIR INGUINAL ADULT (Right )  Patient Location: PACU  Anesthesia Type:General  Level of Consciousness: awake, alert  and oriented  Airway & Oxygen Therapy: Patient Spontanous Breathing and Patient connected to face mask oxygen  Post-op Assessment: Report given to RN and Post -op Vital signs reviewed and stable  Post vital signs: Reviewed and stable  Last Vitals:  Vitals Value Taken Time  BP 138/75 12/08/18 1014  Temp    Pulse 89 12/08/18 1019  Resp 22 12/08/18 1019  SpO2 100 % 12/08/18 1019  Vitals shown include unvalidated device data.  Last Pain:  Vitals:   12/08/18 0730  TempSrc: Temporal  PainSc: 0-No pain         Complications: No apparent anesthesia complications

## 2018-12-08 NOTE — Anesthesia Procedure Notes (Signed)
Procedure Name: Intubation Performed by: Fletcher-Harrison, Baylon Santelli, CRNA Pre-anesthesia Checklist: Patient identified, Emergency Drugs available, Suction available and Patient being monitored Patient Re-evaluated:Patient Re-evaluated prior to induction Oxygen Delivery Method: Circle system utilized Preoxygenation: Pre-oxygenation with 100% oxygen Induction Type: IV induction Ventilation: Mask ventilation without difficulty Laryngoscope Size: McGraph and 3 Grade View: Grade I Tube type: Oral Tube size: 7.0 mm Number of attempts: 1 Airway Equipment and Method: Stylet Placement Confirmation: ETT inserted through vocal cords under direct vision,  positive ETCO2,  CO2 detector and breath sounds checked- equal and bilateral Secured at: 21 cm Tube secured with: Tape Dental Injury: Teeth and Oropharynx as per pre-operative assessment        

## 2018-12-08 NOTE — Anesthesia Preprocedure Evaluation (Addendum)
Anesthesia Evaluation  Patient identified by MRN, date of birth, ID band Patient awake    Reviewed: Allergy & Precautions, NPO status , Patient's Chart, lab work & pertinent test results  History of Anesthesia Complications Negative for: history of anesthetic complications  Airway Mallampati: III  TM Distance: >3 FB Neck ROM: Full    Dental no notable dental hx. (+) Partial Lower, Partial Upper   Pulmonary neg sleep apnea, neg COPD, Current Smoker,    breath sounds clear to auscultation- rhonchi (-) wheezing      Cardiovascular Exercise Tolerance: Good hypertension, (-) CAD and (-) Past MI  Rhythm:Regular Rate:Normal - Systolic murmurs and - Diastolic murmurs    Neuro/Psych negative neurological ROS  negative psych ROS   GI/Hepatic Neg liver ROS,   Endo/Other  neg diabetesHypothyroidism   Renal/GU Renal InsufficiencyRenal disease  negative genitourinary   Musculoskeletal  (+) Arthritis , Osteoarthritis,    Abdominal (+) - obese,   Peds negative pediatric ROS (+)  Hematology negative hematology ROS (+)   Anesthesia Other Findings Past Medical History: No date: Hypothyroidism   Reproductive/Obstetrics                            Anesthesia Physical  Anesthesia Plan  ASA: III  Anesthesia Plan: General   Post-op Pain Management:    Induction: Intravenous  PONV Risk Score and Plan:   Airway Management Planned: Oral ETT  Additional Equipment:   Intra-op Plan:   Post-operative Plan: Extubation in OR  Informed Consent: I have reviewed the patients History and Physical, chart, labs and discussed the procedure including the risks, benefits and alternatives for the proposed anesthesia with the patient or authorized representative who has indicated his/her understanding and acceptance.       Plan Discussed with: CRNA and Anesthesiologist  Anesthesia Plan Comments:          Anesthesia Quick Evaluation

## 2018-12-08 NOTE — Anesthesia Postprocedure Evaluation (Signed)
Anesthesia Post Note  Patient: Amy Hensley  Procedure(s) Performed: OPEN RIGHT HERNIA REPAIR INGUINAL ADULT (Right )  Patient location during evaluation: PACU Anesthesia Type: General Level of consciousness: awake and alert and oriented Pain management: pain level controlled Vital Signs Assessment: post-procedure vital signs reviewed and stable Respiratory status: spontaneous breathing Cardiovascular status: blood pressure returned to baseline Anesthetic complications: no     Last Vitals:  Vitals:   12/08/18 1014 12/08/18 1029  BP: 138/75 122/73  Pulse: 96 88  Resp: 17 16  Temp: 36.8 C   SpO2: 99% 94%    Last Pain:  Vitals:   12/08/18 1029  TempSrc:   PainSc: 3                  Jakaree Pickard

## 2018-12-08 NOTE — Anesthesia Post-op Follow-up Note (Signed)
Anesthesia QCDR form completed.        

## 2018-12-08 NOTE — Interval H&P Note (Signed)
History and Physical Interval Note:  12/08/2018 8:22 AM  Amy Hensley  has presented today for surgery, with the diagnosis of K40.90 NON RECURRENT UNILATERAL INGUINAL HERNIA W/O OBSTRUCTION OR GANGRENE.  The various methods of treatment have been discussed with the patient and family. After consideration of risks, benefits and other options for treatment, the patient has consented to  Procedure(s): OPEN RIGHT HERNIA REPAIR INGUINAL ADULT (Right) as a surgical intervention.  The patient's history has been reviewed, patient examined, no change in status, stable for surgery.  I have reviewed the patient's chart and labs.  Right sided marked in the pre procedure room. Questions were answered to the patient's satisfaction.     Herbert Pun

## 2018-12-08 NOTE — Op Note (Signed)
Preoperative diagnosis: Right Inguinal Hernia.  Postoperative diagnosis: Right Indirect Inguinal Hernia.  Procedure: Right Inguinal hernia repair with mesh  Anesthesia: General  Surgeon: Dr. Windell Moment  Wound Classification: Clean  Indications:  Patient is a 85 y.o. female developed a symptomatic right inguinal hernia. Repair was indicated to avoid complications of incarceration, obstruction and pain, and a prosthetic mesh repair was elected.  Findings: 1 An indirect inguinal hernia was identified 2. Adequate hemostasis achieved  Description of procedure: The patient was taken to the operating room. A time-out was completed verifying correct patient, procedure, site, positioning, and implant(s) and/or special equipment prior to beginning this procedure. spinal anesthesia was induced. The right groin was prepped and draped in the usual sterile fashion. An incision was marked in a natural skin crease and planned to end near the pubic tubercle.  The skin crease incision was made with a knife and deepened through Scarpa's and Camper's fascia with electrocautery until the aponeurosis of the external oblique was encountered. This was cleaned and the external ring was exposed. Hemostasis was achieved in the wound. An incision was made in the midportion of the external oblique aponeurosis in the direction of its fibers. The ilioinguinal nerve was identified and protected throughout the dissection. Flaps of the external oblique were developed cephalad and inferiorly.  The inguinal canal lipoma and sac was identified. It was gently dissected free at the pubic tubercle. Attention was directed to the anteromedial aspect of the cord, where an indirect hernia sac was identified. The sac was carefully dissected free of the lipoma at the level of the internal ring. The sac was opened and contents were reduced. A finger was passed into the peritoneal cavity and the floor of the inguinal canal assessed and found  to be strong. The femoral canal was palpated and no hernia identified. The sac was twisted and suture ligated with 2-0 silk. Redundant sac was excised and submitted to pathology. The stump of the sac was checked for hemostasis and allowed to retract into the abdomen. The cord lipoma was also excised at the base.   Attention then turned to the floor of the canal, which appeared to be grossly weakened without a well-defined defect or sac. The Prolene mesh mesh was inserted. Beginning at the pubic tubercle, the mesh was sutured to the inguinal ligament inferiorly and the conjoint tendon superiorly using interrupted 0 nonabsorbable sutures. Care was taken to assure that the mesh was placed in a relaxed fashion to avoid excessive tension and that no neurovascular structures were caught in the repair.   Hemostasis was again checked. The external oblique aponeurosis was closed with a running suture of 3-0 Vicryl, taking care not to catch the ilioinguinal nerve in the suture line. Scarpa's fascia was closed with interrupted 3-0 Vicryl.  The skin was closed with a subcuticular stitch of Monocryl 4-0. Dermabond was applied.  The patient tolerated the procedure well and was taken to the postanesthesia care unit in stable condition.   Specimen: Hernia sac and inguinal canal lipoma  Complications: None  Estimated Blood Loss: 5 mL

## 2018-12-08 NOTE — Discharge Instructions (Signed)
  Diet: Resume home heart healthy regular diet.   Activity: No heavy lifting >20 pounds (children, pets, laundry, garbage) or strenuous activity until follow-up, but light activity and walking are encouraged. Do not drive or drink alcohol if taking narcotic pain medications.  Wound care: May shower with soapy water and pat dry (do not rub incisions), but no baths or submerging incision underwater until follow-up. (no swimming)   Medications: Resume all home medications. For mild to moderate pain: acetaminophen (Tylenol) or ibuprofen (if no kidney disease). Combining Tylenol with alcohol can substantially increase your risk of causing liver disease. Narcotic pain medications, if prescribed, can be used for severe pain, though may cause nausea, constipation, and drowsiness. Do not combine Tylenol and Norco within a 6 hour period as Norco contains Tylenol. If you do not need the narcotic pain medication, you do not need to fill the prescription.  Call office ([phone]) at any time if any questions, worsening pain, fevers/chills, bleeding, drainage from incision site, or other concerns.   AMBULATORY SURGERY  DISCHARGE INSTRUCTIONS   1) The drugs that you were given will stay in your system until tomorrow so for the next 24 hours you should not:  A) Drive an automobile B) Make any legal decisions C) Drink any alcoholic beverage   2) You may resume regular meals tomorrow.  Today it is better to start with liquids and gradually work up to solid foods.  You may eat anything you prefer, but it is better to start with liquids, then soup and crackers, and gradually work up to solid foods.   3) Please notify your doctor immediately if you have any unusual bleeding, trouble breathing, redness and pain at the surgery site, drainage, fever, or pain not relieved by medication.    4) Additional Instructions:        Please contact your physician with any problems or Same Day Surgery at  [phone], Monday through Friday 6 am to 4 pm, or [place] [facility] number at [phone]. 

## 2018-12-10 LAB — SURGICAL PATHOLOGY

## 2019-03-05 ENCOUNTER — Ambulatory Visit: Payer: Self-pay

## 2019-05-04 DIAGNOSIS — D692 Other nonthrombocytopenic purpura: Secondary | ICD-10-CM | POA: Diagnosis not present

## 2019-05-04 DIAGNOSIS — E7849 Other hyperlipidemia: Secondary | ICD-10-CM | POA: Diagnosis not present

## 2019-05-04 DIAGNOSIS — E039 Hypothyroidism, unspecified: Secondary | ICD-10-CM | POA: Diagnosis not present

## 2019-05-04 DIAGNOSIS — N183 Chronic kidney disease, stage 3 unspecified: Secondary | ICD-10-CM | POA: Diagnosis not present

## 2019-05-04 DIAGNOSIS — I10 Essential (primary) hypertension: Secondary | ICD-10-CM | POA: Diagnosis not present

## 2019-05-13 DIAGNOSIS — E039 Hypothyroidism, unspecified: Secondary | ICD-10-CM | POA: Diagnosis not present

## 2019-05-13 DIAGNOSIS — N183 Chronic kidney disease, stage 3 unspecified: Secondary | ICD-10-CM | POA: Diagnosis not present

## 2019-05-13 DIAGNOSIS — R030 Elevated blood-pressure reading, without diagnosis of hypertension: Secondary | ICD-10-CM | POA: Diagnosis not present

## 2019-05-13 DIAGNOSIS — I701 Atherosclerosis of renal artery: Secondary | ICD-10-CM | POA: Diagnosis not present

## 2019-05-13 DIAGNOSIS — D692 Other nonthrombocytopenic purpura: Secondary | ICD-10-CM | POA: Diagnosis not present

## 2019-05-13 DIAGNOSIS — E7849 Other hyperlipidemia: Secondary | ICD-10-CM | POA: Diagnosis not present

## 2019-05-13 DIAGNOSIS — M810 Age-related osteoporosis without current pathological fracture: Secondary | ICD-10-CM | POA: Diagnosis not present

## 2019-05-13 DIAGNOSIS — E559 Vitamin D deficiency, unspecified: Secondary | ICD-10-CM | POA: Diagnosis not present

## 2019-07-12 ENCOUNTER — Ambulatory Visit: Payer: PPO | Attending: Internal Medicine

## 2019-07-12 DIAGNOSIS — Z23 Encounter for immunization: Secondary | ICD-10-CM

## 2019-07-12 NOTE — Progress Notes (Signed)
   Covid-19 Vaccination Clinic  Name:  Amy Hensley    MRN: [id number] DOB: [birth year]  07/12/2019  Amy Hensley was observed post Covid-19 immunization for 15 minutes without incidence. She was provided with Vaccine Information Sheet and instruction to access the V-Safe system.   Amy Hensley was instructed to call 911 with any severe reactions post vaccine: Marland Kitchen Difficulty breathing  . Swelling of your face and throat  . A fast heartbeat  . A bad rash all over your body  . Dizziness and weakness    Immunizations Administered    Name Date Dose VIS Date Route   Pfizer COVID-19 Vaccine 07/12/2019  3:32 PM 0.3 mL 04/24/2019 Intramuscular   Manufacturer: [place]   Lot: [id number]   [place]: [id number]

## 2019-08-11 ENCOUNTER — Ambulatory Visit: Payer: PPO | Attending: Internal Medicine

## 2019-08-11 DIAGNOSIS — Z23 Encounter for immunization: Secondary | ICD-10-CM

## 2019-08-11 NOTE — Progress Notes (Signed)
   Covid-19 Vaccination Clinic  Name:  Amy Hensley    MRN: [id number] DOB: 08/25/1932  08/11/2019  [name] was observed post Covid-19 immunization for 15 minutes without incident. She was provided with Vaccine Information Sheet and instruction to access the V-Safe system.   [name] was instructed to call 911 with any severe reactions post vaccine: Marland Kitchen Difficulty breathing  . Swelling of face and throat  . A fast heartbeat  . A bad rash all over body  . Dizziness and weakness   Immunizations Administered    Name Date Dose VIS Date Route   Pfizer COVID-19 Vaccine 08/11/2019  1:26 PM 0.3 mL 04/24/2019 Intramuscular   Manufacturer: [place]   Lot: [id number]   [place]: [id number]

## 2019-09-10 DIAGNOSIS — R030 Elevated blood-pressure reading, without diagnosis of hypertension: Secondary | ICD-10-CM | POA: Diagnosis not present

## 2019-09-10 DIAGNOSIS — H6691 Otitis media, unspecified, right ear: Secondary | ICD-10-CM | POA: Diagnosis not present

## 2019-09-10 DIAGNOSIS — H6981 Other specified disorders of Eustachian tube, right ear: Secondary | ICD-10-CM | POA: Diagnosis not present

## 2019-11-04 DIAGNOSIS — E7849 Other hyperlipidemia: Secondary | ICD-10-CM | POA: Diagnosis not present

## 2019-11-04 DIAGNOSIS — E039 Hypothyroidism, unspecified: Secondary | ICD-10-CM | POA: Diagnosis not present

## 2019-11-04 DIAGNOSIS — N183 Chronic kidney disease, stage 3 unspecified: Secondary | ICD-10-CM | POA: Diagnosis not present

## 2019-11-11 DIAGNOSIS — D692 Other nonthrombocytopenic purpura: Secondary | ICD-10-CM | POA: Diagnosis not present

## 2019-11-11 DIAGNOSIS — Z0000 Encounter for general adult medical examination without abnormal findings: Secondary | ICD-10-CM | POA: Diagnosis not present

## 2019-11-11 DIAGNOSIS — I701 Atherosclerosis of renal artery: Secondary | ICD-10-CM | POA: Diagnosis not present

## 2019-11-11 DIAGNOSIS — R030 Elevated blood-pressure reading, without diagnosis of hypertension: Secondary | ICD-10-CM | POA: Diagnosis not present

## 2019-11-11 DIAGNOSIS — M810 Age-related osteoporosis without current pathological fracture: Secondary | ICD-10-CM | POA: Diagnosis not present

## 2019-11-11 DIAGNOSIS — E559 Vitamin D deficiency, unspecified: Secondary | ICD-10-CM | POA: Diagnosis not present

## 2019-11-11 DIAGNOSIS — N183 Chronic kidney disease, stage 3 unspecified: Secondary | ICD-10-CM | POA: Diagnosis not present

## 2019-11-11 DIAGNOSIS — E039 Hypothyroidism, unspecified: Secondary | ICD-10-CM | POA: Diagnosis not present

## 2019-11-11 DIAGNOSIS — E7849 Other hyperlipidemia: Secondary | ICD-10-CM | POA: Diagnosis not present

## 2019-11-17 DIAGNOSIS — M810 Age-related osteoporosis without current pathological fracture: Secondary | ICD-10-CM | POA: Diagnosis not present

## 2020-01-11 DIAGNOSIS — R0600 Dyspnea, unspecified: Secondary | ICD-10-CM | POA: Diagnosis not present

## 2020-01-11 DIAGNOSIS — R002 Palpitations: Secondary | ICD-10-CM | POA: Diagnosis not present

## 2020-01-11 DIAGNOSIS — R9431 Abnormal electrocardiogram [ECG] [EKG]: Secondary | ICD-10-CM | POA: Diagnosis not present

## 2020-01-14 DIAGNOSIS — R002 Palpitations: Secondary | ICD-10-CM | POA: Diagnosis not present

## 2020-01-14 DIAGNOSIS — E7849 Other hyperlipidemia: Secondary | ICD-10-CM | POA: Diagnosis not present

## 2020-01-14 DIAGNOSIS — R0602 Shortness of breath: Secondary | ICD-10-CM | POA: Diagnosis not present

## 2020-01-14 DIAGNOSIS — Z720 Tobacco use: Secondary | ICD-10-CM | POA: Diagnosis not present

## 2020-01-14 DIAGNOSIS — N183 Chronic kidney disease, stage 3 unspecified: Secondary | ICD-10-CM | POA: Diagnosis not present

## 2020-01-14 DIAGNOSIS — R9431 Abnormal electrocardiogram [ECG] [EKG]: Secondary | ICD-10-CM | POA: Diagnosis not present

## 2020-01-14 DIAGNOSIS — Z87898 Personal history of other specified conditions: Secondary | ICD-10-CM | POA: Diagnosis not present

## 2020-02-04 DIAGNOSIS — N183 Chronic kidney disease, stage 3 unspecified: Secondary | ICD-10-CM | POA: Diagnosis not present

## 2020-02-04 DIAGNOSIS — E7849 Other hyperlipidemia: Secondary | ICD-10-CM | POA: Diagnosis not present

## 2020-02-04 DIAGNOSIS — E039 Hypothyroidism, unspecified: Secondary | ICD-10-CM | POA: Diagnosis not present

## 2020-02-12 DIAGNOSIS — N1832 Chronic kidney disease, stage 3b: Secondary | ICD-10-CM | POA: Diagnosis not present

## 2020-02-12 DIAGNOSIS — M810 Age-related osteoporosis without current pathological fracture: Secondary | ICD-10-CM | POA: Diagnosis not present

## 2020-02-12 DIAGNOSIS — D692 Other nonthrombocytopenic purpura: Secondary | ICD-10-CM | POA: Diagnosis not present

## 2020-02-12 DIAGNOSIS — R030 Elevated blood-pressure reading, without diagnosis of hypertension: Secondary | ICD-10-CM | POA: Diagnosis not present

## 2020-02-12 DIAGNOSIS — R002 Palpitations: Secondary | ICD-10-CM | POA: Diagnosis not present

## 2020-02-12 DIAGNOSIS — E559 Vitamin D deficiency, unspecified: Secondary | ICD-10-CM | POA: Diagnosis not present

## 2020-02-12 DIAGNOSIS — F419 Anxiety disorder, unspecified: Secondary | ICD-10-CM | POA: Diagnosis not present

## 2020-02-12 DIAGNOSIS — E039 Hypothyroidism, unspecified: Secondary | ICD-10-CM | POA: Diagnosis not present

## 2020-02-12 DIAGNOSIS — Z720 Tobacco use: Secondary | ICD-10-CM | POA: Diagnosis not present

## 2020-02-12 DIAGNOSIS — E7849 Other hyperlipidemia: Secondary | ICD-10-CM | POA: Diagnosis not present

## 2020-02-12 DIAGNOSIS — I701 Atherosclerosis of renal artery: Secondary | ICD-10-CM | POA: Diagnosis not present

## 2020-02-15 DIAGNOSIS — R0602 Shortness of breath: Secondary | ICD-10-CM | POA: Diagnosis not present

## 2020-02-15 DIAGNOSIS — R9431 Abnormal electrocardiogram [ECG] [EKG]: Secondary | ICD-10-CM | POA: Diagnosis not present

## 2020-02-18 DIAGNOSIS — R002 Palpitations: Secondary | ICD-10-CM | POA: Diagnosis not present

## 2020-02-18 DIAGNOSIS — R030 Elevated blood-pressure reading, without diagnosis of hypertension: Secondary | ICD-10-CM | POA: Diagnosis not present

## 2020-02-18 DIAGNOSIS — E7849 Other hyperlipidemia: Secondary | ICD-10-CM | POA: Diagnosis not present

## 2020-03-30 DIAGNOSIS — Z720 Tobacco use: Secondary | ICD-10-CM | POA: Diagnosis not present

## 2020-03-30 DIAGNOSIS — R002 Palpitations: Secondary | ICD-10-CM | POA: Diagnosis not present

## 2020-03-30 DIAGNOSIS — N1832 Chronic kidney disease, stage 3b: Secondary | ICD-10-CM | POA: Diagnosis not present

## 2020-03-30 DIAGNOSIS — E7849 Other hyperlipidemia: Secondary | ICD-10-CM | POA: Diagnosis not present

## 2020-04-26 DIAGNOSIS — H8111 Benign paroxysmal vertigo, right ear: Secondary | ICD-10-CM | POA: Diagnosis not present

## 2020-05-03 DIAGNOSIS — H8111 Benign paroxysmal vertigo, right ear: Secondary | ICD-10-CM | POA: Diagnosis not present

## 2020-06-07 DIAGNOSIS — N1832 Chronic kidney disease, stage 3b: Secondary | ICD-10-CM | POA: Diagnosis not present

## 2020-06-07 DIAGNOSIS — E7849 Other hyperlipidemia: Secondary | ICD-10-CM | POA: Diagnosis not present

## 2020-06-14 DIAGNOSIS — Z720 Tobacco use: Secondary | ICD-10-CM | POA: Diagnosis not present

## 2020-06-14 DIAGNOSIS — E559 Vitamin D deficiency, unspecified: Secondary | ICD-10-CM | POA: Diagnosis not present

## 2020-06-14 DIAGNOSIS — M810 Age-related osteoporosis without current pathological fracture: Secondary | ICD-10-CM | POA: Diagnosis not present

## 2020-06-14 DIAGNOSIS — E039 Hypothyroidism, unspecified: Secondary | ICD-10-CM | POA: Diagnosis not present

## 2020-06-14 DIAGNOSIS — E7849 Other hyperlipidemia: Secondary | ICD-10-CM | POA: Diagnosis not present

## 2020-06-14 DIAGNOSIS — I701 Atherosclerosis of renal artery: Secondary | ICD-10-CM | POA: Diagnosis not present

## 2020-06-14 DIAGNOSIS — D692 Other nonthrombocytopenic purpura: Secondary | ICD-10-CM | POA: Diagnosis not present

## 2020-06-14 DIAGNOSIS — N1832 Chronic kidney disease, stage 3b: Secondary | ICD-10-CM | POA: Diagnosis not present

## 2020-06-14 DIAGNOSIS — R030 Elevated blood-pressure reading, without diagnosis of hypertension: Secondary | ICD-10-CM | POA: Diagnosis not present

## 2020-06-14 DIAGNOSIS — R002 Palpitations: Secondary | ICD-10-CM | POA: Diagnosis not present

## 2020-12-05 DIAGNOSIS — E7849 Other hyperlipidemia: Secondary | ICD-10-CM | POA: Diagnosis not present

## 2020-12-05 DIAGNOSIS — M810 Age-related osteoporosis without current pathological fracture: Secondary | ICD-10-CM | POA: Diagnosis not present

## 2020-12-05 DIAGNOSIS — N1832 Chronic kidney disease, stage 3b: Secondary | ICD-10-CM | POA: Diagnosis not present

## 2020-12-05 DIAGNOSIS — E559 Vitamin D deficiency, unspecified: Secondary | ICD-10-CM | POA: Diagnosis not present

## 2020-12-05 DIAGNOSIS — R030 Elevated blood-pressure reading, without diagnosis of hypertension: Secondary | ICD-10-CM | POA: Diagnosis not present

## 2020-12-05 DIAGNOSIS — E039 Hypothyroidism, unspecified: Secondary | ICD-10-CM | POA: Diagnosis not present

## 2020-12-05 DIAGNOSIS — D692 Other nonthrombocytopenic purpura: Secondary | ICD-10-CM | POA: Diagnosis not present

## 2020-12-12 DIAGNOSIS — R002 Palpitations: Secondary | ICD-10-CM | POA: Diagnosis not present

## 2020-12-12 DIAGNOSIS — E039 Hypothyroidism, unspecified: Secondary | ICD-10-CM | POA: Diagnosis not present

## 2020-12-12 DIAGNOSIS — E7849 Other hyperlipidemia: Secondary | ICD-10-CM | POA: Diagnosis not present

## 2020-12-12 DIAGNOSIS — R030 Elevated blood-pressure reading, without diagnosis of hypertension: Secondary | ICD-10-CM | POA: Diagnosis not present

## 2020-12-12 DIAGNOSIS — M810 Age-related osteoporosis without current pathological fracture: Secondary | ICD-10-CM | POA: Diagnosis not present

## 2020-12-12 DIAGNOSIS — Z0000 Encounter for general adult medical examination without abnormal findings: Secondary | ICD-10-CM | POA: Diagnosis not present

## 2020-12-12 DIAGNOSIS — E559 Vitamin D deficiency, unspecified: Secondary | ICD-10-CM | POA: Diagnosis not present

## 2020-12-12 DIAGNOSIS — N2581 Secondary hyperparathyroidism of renal origin: Secondary | ICD-10-CM | POA: Diagnosis not present

## 2020-12-12 DIAGNOSIS — D692 Other nonthrombocytopenic purpura: Secondary | ICD-10-CM | POA: Diagnosis not present

## 2021-01-06 DIAGNOSIS — J069 Acute upper respiratory infection, unspecified: Secondary | ICD-10-CM | POA: Diagnosis not present

## 2021-03-23 DIAGNOSIS — H8111 Benign paroxysmal vertigo, right ear: Secondary | ICD-10-CM | POA: Diagnosis not present

## 2021-03-30 DIAGNOSIS — H8111 Benign paroxysmal vertigo, right ear: Secondary | ICD-10-CM | POA: Diagnosis not present

## 2021-04-17 DIAGNOSIS — J069 Acute upper respiratory infection, unspecified: Secondary | ICD-10-CM | POA: Diagnosis not present

## 2021-04-17 DIAGNOSIS — J019 Acute sinusitis, unspecified: Secondary | ICD-10-CM | POA: Diagnosis not present

## 2021-05-03 DIAGNOSIS — R42 Dizziness and giddiness: Secondary | ICD-10-CM | POA: Diagnosis not present

## 2021-05-04 DIAGNOSIS — R42 Dizziness and giddiness: Secondary | ICD-10-CM | POA: Diagnosis not present

## 2021-05-05 DIAGNOSIS — R531 Weakness: Secondary | ICD-10-CM | POA: Diagnosis not present

## 2021-05-09 ENCOUNTER — Other Ambulatory Visit: Payer: Self-pay | Admitting: Otolaryngology

## 2021-05-09 DIAGNOSIS — R0609 Other forms of dyspnea: Secondary | ICD-10-CM | POA: Diagnosis not present

## 2021-05-09 DIAGNOSIS — R519 Headache, unspecified: Secondary | ICD-10-CM | POA: Diagnosis not present

## 2021-05-09 DIAGNOSIS — R0600 Dyspnea, unspecified: Secondary | ICD-10-CM | POA: Diagnosis not present

## 2021-05-09 DIAGNOSIS — R42 Dizziness and giddiness: Secondary | ICD-10-CM | POA: Diagnosis not present

## 2021-05-09 DIAGNOSIS — E039 Hypothyroidism, unspecified: Secondary | ICD-10-CM | POA: Diagnosis not present

## 2021-05-09 DIAGNOSIS — R5383 Other fatigue: Secondary | ICD-10-CM | POA: Diagnosis not present

## 2021-05-16 DIAGNOSIS — R0609 Other forms of dyspnea: Secondary | ICD-10-CM | POA: Diagnosis not present

## 2021-05-16 DIAGNOSIS — R7989 Other specified abnormal findings of blood chemistry: Secondary | ICD-10-CM | POA: Diagnosis not present

## 2021-05-18 ENCOUNTER — Ambulatory Visit: Payer: Self-pay | Admitting: General Surgery

## 2021-05-18 DIAGNOSIS — R700 Elevated erythrocyte sedimentation rate: Secondary | ICD-10-CM | POA: Diagnosis not present

## 2021-05-18 DIAGNOSIS — Z7960 Long term (current) use of unspecified immunomodulators and immunosuppressants: Secondary | ICD-10-CM | POA: Diagnosis not present

## 2021-05-18 DIAGNOSIS — M316 Other giant cell arteritis: Secondary | ICD-10-CM | POA: Diagnosis not present

## 2021-05-18 DIAGNOSIS — Z111 Encounter for screening for respiratory tuberculosis: Secondary | ICD-10-CM | POA: Diagnosis not present

## 2021-05-18 DIAGNOSIS — R519 Headache, unspecified: Secondary | ICD-10-CM | POA: Diagnosis not present

## 2021-05-18 DIAGNOSIS — R5383 Other fatigue: Secondary | ICD-10-CM | POA: Diagnosis not present

## 2021-05-18 DIAGNOSIS — M6281 Muscle weakness (generalized): Secondary | ICD-10-CM | POA: Diagnosis not present

## 2021-05-18 NOTE — H&P (Signed)
PATIENT PROFILE: Amy Hensley is a 88 y.o. female who presents to the Clinic for consultation at the request of Dr. Posey Pronto for evaluation of temporal artery biopsy.  PCP:  Cheryll Cockayne, MD  HISTORY OF PRESENT ILLNESS: Amy Hensley reports she has been having bilateral headaches and tightness since 3 weeks ago.  She endorses that the tightness is bilateral with a little bit more discomfort on the left side.  This also radiates to her occipital area.  She denies any vision complaints.  She denies any nausea or vomiting.  She denies any aura.  She cannot identify any alleviating or aggravating factors.  Patient was abided by rheumatology.  She was found with elevated sed rate of 119.  Sed rate repeated in 2 days 89.  Rheumatology concerned about temporal arteritis.  Per artery biopsy was requested.   PROBLEM LIST: Problem List  Date Reviewed: 05/18/2021          Noted   Abnormal ECG 01/14/2020   Heart palpitations 01/14/2020   Left renal artery stenosis (CMS-HCC) 11/26/2018   Overview    Incidental finding on CT 7/20 of marked left renal artery stenosis with associated left renal atrophy      Right inguinal hernia 11/26/2018   Overview    Confirmed by CT 7/20.  Surgical consultation obtained      CKD (chronic kidney disease) stage 3, GFR 30-59 ml/min (CMS-HCC) 08/19/2017   Acquired hypothyroidism, unspecified 08/06/2016   Senile purpura (CMS-HCC) 08/06/2016   Blood pressure elevated without history of HTN 07/04/2015   Tobacco abuse 07/04/2015   Anxiety Unknown   Overview    mild      Hyperlipidemia Unknown   Osteoporosis, post-menopausal Unknown   Overview    DEXA scan 2010 revealing T-score -2.6 at femoral neck.  Declined Reclast due to concerns over side effects.  Evaluated by endocrinology 5/17; patient again declines treatment      Vitamin D deficiency Unknown   Insomnia Unknown    GENERAL REVIEW OF SYSTEMS:   General ROS: negative for - chills, fatigue, fever, weight  gain or weight loss Allergy and Immunology ROS: negative for - hives  Hematological and Lymphatic ROS: negative for - bleeding problems or bruising, negative for palpable nodes Endocrine ROS: negative for - heat or cold intolerance, hair changes Respiratory ROS: negative for - cough, shortness of breath or wheezing Cardiovascular ROS: no chest pain or palpitations GI ROS: negative for nausea, vomiting, abdominal pain, diarrhea, constipation Musculoskeletal ROS: negative for - joint swelling or muscle pain Neurological ROS: negative for - confusion, syncope.  Positive for headache Dermatological ROS: negative for pruritus and rash Psychiatric: negative for anxiety, depression, difficulty sleeping and memory loss  MEDICATIONS: Current Outpatient Medications  Medication Sig Dispense Refill   amLODIPine (NORVASC) 10 MG tablet Take 1 tablet by mouth once daily 90 tablet 1   calcium carbonate (TUMS E-X) 300 mg (750 mg) chewable tablet Take 300 mg of elemental by mouth once daily as needed for Heartburn     calcium carbonate-vitamin D3 (CALTRATE 600+D) 600 mg-10 mcg (400 unit) tablet Take 1 tablet by mouth once daily Takes occasionally       cholecalciferol (VITAMIN D3) 1,000 unit capsule Take 2,000 Units by mouth once daily. Takes occasionally      diphenhydrAMINE-acetaminophen (TYLENOL PM EXTRA STRENGTH) 25-500 mg per tablet Take 1 tablet by mouth nightly as needed     esomeprazole (NEXIUM) 20 MG DR capsule Take 1 capsule (20 mg total)  by mouth once daily 30 capsule 11   levothyroxine (SYNTHROID) 50 MCG tablet TAKE 1 TABLET BY MOUTH ONCE DAILY ON  AN  EMPTY  STOMACH  WITH  A  GLASS  OF  WATER  AT  LEAST  30  TO  60  MINUTES  BEFORE  BREAKFAST 90 tablet 1   predniSONE (DELTASONE) 20 MG tablet Take 3 tablets (60 mg total) by mouth once daily 90 tablet 1   No current facility-administered medications for this visit.    ALLERGIES: Buspirone and Fosamax [alendronate]  PAST MEDICAL HISTORY: Past  Medical History:  Diagnosis Date   Acquired hypothyroidism, unspecified 08/06/2016   Anxiety    mild   Elevated BP    History of palpitations    remotely. Reports previous cardiac work-up approximately 2000, negative.   History of tobacco abuse    Hyperlipidemia    Insomnia    Osteoporosis, post-menopausal    DEXA scan 2010 revealing T-score -2.6 at femoral neck.  Declined Reclast due to concerns over side effects   Vitamin D deficiency     PAST SURGICAL HISTORY: Past Surgical History:  Procedure Laterality Date   HYSTERECTOMY  1974   for benign tumor.  Reports one ovary and portion of another removed at that time.   LAPAROSCOPIC INGUINAL HERNIA REPAIR Right 12/08/2018   Dr. Lesli Albee   APPENDECTOMY  [phone]   CATARACT EXTRACTION Right    CESAREAN SECTION     x2     FAMILY HISTORY: Family History  Problem Relation Age of Onset   Coronary Artery Disease (Blocked arteries around heart) Sister    Coronary Artery Disease (Blocked arteries around heart) Brother      SOCIAL HISTORY: Social History   Socioeconomic History   Marital status: Married  Tobacco Use   Smoking status: Former    Packs/day: 0.50    Types: Cigarettes    Quit date: 06/14/2020    Years since quitting: 0.9   Smokeless tobacco: Never  Vaping Use   Vaping Use: Never used  Substance and Sexual Activity   Alcohol use: Yes    Comment: rare use    PHYSICAL EXAM: Vitals:   05/18/21 1145  BP: 128/82  Pulse: 99   Body mass index is 21.28 kg/m. Weight: 56.2 kg (124 lb)   GENERAL: Alert, active, oriented x3  HEENT: Pupils equal reactive to light. Extraocular movements are intact. Sclera clear. Palpebral conjunctiva normal red color.Pharynx clear.  Palpable bilateral temporal artery pulse  NECK: Supple with no palpable mass and no adenopathy.  LUNGS: Sound clear with no rales rhonchi or wheezes.  HEART: Regular rhythm S1 and S2 without murmur.  ABDOMEN: Soft and depressible, nontender  with no palpable mass, no hepatomegaly.   EXTREMITIES: Well-developed well-nourished symmetrical with no dependent edema.  NEUROLOGICAL: Awake alert oriented, facial expression symmetrical, moving all extremities.  REVIEW OF DATA: I have reviewed the following data today: Initial consult on 05/18/2021  Component Date Value   Sedimentation Rate-Autom* 05/18/2021 84 (H)    CK, Total (Creatine Kina* 05/18/2021 46   Ancillary Procedure on 05/16/2021  Component Date Value   LV Ejection Fraction (%) 05/16/2021 55    Aortic Valve Stenosis Gr* 05/16/2021 none    Aortic Valve Regurgitati* 05/16/2021 trivial    Mitral Valve Stenosis Gr* 05/16/2021 none    Mitral Valve Regurgitati* 05/16/2021 none    Tricuspid Valve Regurgit* 05/16/2021 mild    Tricuspid Valve Regurgit* 05/16/2021 2.6    Right Ventricle Systolic*  05/16/2021 30.2    LV End Diastolic Diamete* 31/51/7616 3.4    LV End Systolic Diameter* 07/37/1062 2    LV Septum Wall Thickness* 05/16/2021 1.4    LV Posterior Wall Thickn* 05/16/2021 0.86    Left Atrium Diameter (cm) 05/16/2021 4   Office Visit on 05/09/2021  Component Date Value   WBC (White Blood Cell Co* 05/09/2021 9.7    RBC (Red Blood Cell Coun* 05/09/2021 4.23    Hemoglobin 05/09/2021 12.4    Hematocrit 05/09/2021 38.2    MCV (Mean Corpuscular Vo* 05/09/2021 90.3    MCH (Mean Corpuscular He* 05/09/2021 29.3    MCHC (Mean Corpuscular H* 05/09/2021 32.5    Platelet Count 05/09/2021 563 (H)    RDW-CV (Red Cell Distrib* 05/09/2021 12.1    MPV (Mean Platelet Volum* 05/09/2021 10.3    Neutrophils 05/09/2021 7.08    Lymphocytes 05/09/2021 1.32    Monocytes 05/09/2021 0.94    Eosinophils 05/09/2021 0.23    Basophils 05/09/2021 0.06    Neutrophil % 05/09/2021 73.1 (H)    Lymphocyte % 05/09/2021 13.6    Monocyte % 05/09/2021 9.7    Eosinophil % 05/09/2021 2.4    Basophil% 05/09/2021 0.6    Immature Granulocyte % 05/09/2021 0.6    Immature Granulocyte Cou* 05/09/2021  0.06    Glucose 05/09/2021 102    Sodium 05/09/2021 135 (L)    Potassium 05/09/2021 3.5 (L)    Chloride 05/09/2021 100    Carbon Dioxide (CO2) 05/09/2021 20.6 (L)    Urea Nitrogen (BUN) 05/09/2021 12    Creatinine 05/09/2021 1.5 (H)    Glomerular Filtration Ra* 05/09/2021 33 (L)    Calcium 05/09/2021 9.9    AST  05/09/2021 26    ALT  05/09/2021 19    Alk Phos (alkaline Phosp* 05/09/2021 103    Albumin 05/09/2021 3.9    Bilirubin, Total 05/09/2021 0.5    Protein, Total 05/09/2021 8.1 (H)    A/G Ratio 05/09/2021 0.9 (L)    Thyroid Stimulating Horm* 05/09/2021 4.337    Color 05/09/2021 Yellow    Clarity 05/09/2021 Clear    Specific Gravity 05/09/2021 1.015    pH, Urine 05/09/2021 6.0    Protein, Urinalysis 05/09/2021 Trace    Glucose, Urinalysis 05/09/2021 Negative    Ketones, Urinalysis 05/09/2021 Negative    Blood, Urinalysis 05/09/2021 Negative    Nitrite, Urinalysis 05/09/2021 Negative    Leukocyte Esterase, Urin* 05/09/2021 Small (!)    White Blood Cells, Urina* 05/09/2021 0-3    Red Blood Cells, Urinaly* 05/09/2021 None Seen    Bacteria, Urinalysis 05/09/2021 Rare (!)    Squamous Epithelial Cell* 05/09/2021 Few    Sedimentation Rate-Autom* 05/09/2021 119 (H)    BNP - [facility] 05/09/2021 310.9 (H)   Office Visit on 05/05/2021  Component Date Value   WBC (White Blood Cell Co* 05/05/2021 10.3 (H)    RBC (Red Blood Cell Coun* 05/05/2021 3.99 (L)    Hemoglobin 05/05/2021 11.9 (L)    Hematocrit 05/05/2021 36.4    MCV (Mean Corpuscular Vo* 05/05/2021 91.2    MCH (Mean Corpuscular He* 05/05/2021 29.8    MCHC (Mean Corpuscular H* 05/05/2021 32.7    Platelet Count 05/05/2021 402    RDW-CV (Red Cell Distrib* 05/05/2021 12.0    MPV (Mean Platelet Volum* 05/05/2021 9.9    Neutrophils 05/05/2021 8.19 (H)    Lymphocytes 05/05/2021 0.91 (L)    Monocytes 05/05/2021 0.94    Eosinophils 05/05/2021 0.11    Basophils 05/05/2021 0.07  Neutrophil % 05/05/2021 79.7 (H)    Lymphocyte %  05/05/2021 8.9 (L)    Monocyte % 05/05/2021 9.2    Eosinophil % 05/05/2021 1.1    Basophil% 05/05/2021 0.7    Immature Granulocyte % 05/05/2021 0.4    Immature Granulocyte Cou* 05/05/2021 0.04    Glucose 05/05/2021 104    Sodium 05/05/2021 136    Potassium 05/05/2021 4.0    Chloride 05/05/2021 100    Carbon Dioxide (CO2) 05/05/2021 24.9    Urea Nitrogen (BUN) 05/05/2021 20    Creatinine 05/05/2021 1.6 (H)    Glomerular Filtration Ra* 05/05/2021 31 (L)    Calcium 05/05/2021 9.6    AST  05/05/2021 32    ALT  05/05/2021 22    Alk Phos (alkaline Phosp* 05/05/2021 103    Albumin 05/05/2021 3.7    Bilirubin, Total 05/05/2021 0.7    Protein, Total 05/05/2021 7.8    A/G Ratio 05/05/2021 0.9 (L)    Color 05/05/2021 Yellow    Clarity 05/05/2021 Clear    Specific Gravity 05/05/2021 1.015    pH, Urine 05/05/2021 7.0    Protein, Urinalysis 05/05/2021 30 (!)    Glucose, Urinalysis 05/05/2021 Negative    Ketones, Urinalysis 05/05/2021 15 (!)    Blood, Urinalysis 05/05/2021 Negative    Nitrite, Urinalysis 05/05/2021 Negative    Leukocyte Esterase, Urin* 05/05/2021 Small (!)    White Blood Cells, Urina* 05/05/2021 0-3    Red Blood Cells, Urinaly* 05/05/2021 None Seen    Bacteria, Urinalysis 05/05/2021 Rare (!)    Squamous Epithelial Cell* 05/05/2021 Rare      ASSESSMENT: Amy Hensley is a 88 y.o. female presenting for consultation for temporal artery biopsy.  Patient with bilateral headache and tightness.  Patient describes discomfort more on the left than on the right.  She also had elevated sed rate up to 119.  She was evaluated by rheumatology and temporal arteritis was of concern.  She was started on prednisone therapy.  It was recommended to proceed with temporal artery biopsy.  I discussed with the patient the procedure of temporal artery biopsy.  I discussed with patient and her husband the risks of the procedure that includes bleeding, infection, headache, injury to adjacent organs  such as nerves, among others.  They report understood and agreed to proceed.  Temporal arteritis (CMS-HCC) [M31.6]  PLAN: Temporal artery biopsy ([id number]) Contact us if you have any concern  Patient and her husband verbalized understanding, all questions were answered, and were agreeable with the plan outlined above.    Herbert Pun, MD  Electronically signed by Herbert Pun, MD

## 2021-05-18 NOTE — H&P (View-Only) (Signed)
PATIENT PROFILE: Amy Hensley is a 88 y.o. female who presents to the Clinic for consultation at the request of Dr. Posey Pronto for evaluation of temporal artery biopsy.  PCP:  Cheryll Cockayne, MD  HISTORY OF PRESENT ILLNESS: Amy Hensley reports she has been having bilateral headaches and tightness since 3 weeks ago.  She endorses that the tightness is bilateral with a little bit more discomfort on the left side.  This also radiates to her occipital area.  She denies any vision complaints.  She denies any nausea or vomiting.  She denies any aura.  She cannot identify any alleviating or aggravating factors.  Patient was abided by rheumatology.  She was found with elevated sed rate of 119.  Sed rate repeated in 2 days 89.  Rheumatology concerned about temporal arteritis.  Per artery biopsy was requested.   PROBLEM LIST: Problem List  Date Reviewed: 05/18/2021          Noted   Abnormal ECG 01/14/2020   Heart palpitations 01/14/2020   Left renal artery stenosis (CMS-HCC) 11/26/2018   Overview    Incidental finding on CT 7/20 of marked left renal artery stenosis with associated left renal atrophy      Right inguinal hernia 11/26/2018   Overview    Confirmed by CT 7/20.  Surgical consultation obtained      CKD (chronic kidney disease) stage 3, GFR 30-59 ml/min (CMS-HCC) 08/19/2017   Acquired hypothyroidism, unspecified 08/06/2016   Senile purpura (CMS-HCC) 08/06/2016   Blood pressure elevated without history of HTN 07/04/2015   Tobacco abuse 07/04/2015   Anxiety Unknown   Overview    mild      Hyperlipidemia Unknown   Osteoporosis, post-menopausal Unknown   Overview    DEXA scan 2010 revealing T-score -2.6 at femoral neck.  Declined Reclast due to concerns over side effects.  Evaluated by endocrinology 5/17; patient again declines treatment      Vitamin D deficiency Unknown   Insomnia Unknown    GENERAL REVIEW OF SYSTEMS:   General ROS: negative for - chills, fatigue, fever, weight  gain or weight loss Allergy and Immunology ROS: negative for - hives  Hematological and Lymphatic ROS: negative for - bleeding problems or bruising, negative for palpable nodes Endocrine ROS: negative for - heat or cold intolerance, hair changes Respiratory ROS: negative for - cough, shortness of breath or wheezing Cardiovascular ROS: no chest pain or palpitations GI ROS: negative for nausea, vomiting, abdominal pain, diarrhea, constipation Musculoskeletal ROS: negative for - joint swelling or muscle pain Neurological ROS: negative for - confusion, syncope.  Positive for headache Dermatological ROS: negative for pruritus and rash Psychiatric: negative for anxiety, depression, difficulty sleeping and memory loss  MEDICATIONS: Current Outpatient Medications  Medication Sig Dispense Refill   amLODIPine (NORVASC) 10 MG tablet Take 1 tablet by mouth once daily 90 tablet 1   calcium carbonate (TUMS E-X) 300 mg (750 mg) chewable tablet Take 300 mg of elemental by mouth once daily as needed for Heartburn     calcium carbonate-vitamin D3 (CALTRATE 600+D) 600 mg-10 mcg (400 unit) tablet Take 1 tablet by mouth once daily Takes occasionally       cholecalciferol (VITAMIN D3) 1,000 unit capsule Take 2,000 Units by mouth once daily. Takes occasionally      diphenhydrAMINE-acetaminophen (TYLENOL PM EXTRA STRENGTH) 25-500 mg per tablet Take 1 tablet by mouth nightly as needed     esomeprazole (NEXIUM) 20 MG DR capsule Take 1 capsule (20 mg total)  by mouth once daily 30 capsule 11   levothyroxine (SYNTHROID) 50 MCG tablet TAKE 1 TABLET BY MOUTH ONCE DAILY ON  AN  EMPTY  STOMACH  WITH  A  GLASS  OF  WATER  AT  LEAST  30  TO  60  MINUTES  BEFORE  BREAKFAST 90 tablet 1   predniSONE (DELTASONE) 20 MG tablet Take 3 tablets (60 mg total) by mouth once daily 90 tablet 1   No current facility-administered medications for this visit.    ALLERGIES: Buspirone and Fosamax [alendronate]  PAST MEDICAL HISTORY: Past  Medical History:  Diagnosis Date   Acquired hypothyroidism, unspecified 08/06/2016   Anxiety    mild   Elevated BP    History of palpitations    remotely. Reports previous cardiac work-up approximately 2000, negative.   History of tobacco abuse    Hyperlipidemia    Insomnia    Osteoporosis, post-menopausal    DEXA scan 2010 revealing T-score -2.6 at femoral neck.  Declined Reclast due to concerns over side effects   Vitamin D deficiency     PAST SURGICAL HISTORY: Past Surgical History:  Procedure Laterality Date   HYSTERECTOMY  1974   for benign tumor.  Reports one ovary and portion of another removed at that time.   LAPAROSCOPIC INGUINAL HERNIA REPAIR Right 12/08/2018   Dr. Lesli Albee   APPENDECTOMY  [phone]   CATARACT EXTRACTION Right    CESAREAN SECTION     x2     FAMILY HISTORY: Family History  Problem Relation Age of Onset   Coronary Artery Disease (Blocked arteries around heart) Sister    Coronary Artery Disease (Blocked arteries around heart) Brother      SOCIAL HISTORY: Social History   Socioeconomic History   Marital status: Married  Tobacco Use   Smoking status: Former    Packs/day: 0.50    Types: Cigarettes    Quit date: 06/14/2020    Years since quitting: 0.9   Smokeless tobacco: Never  Vaping Use   Vaping Use: Never used  Substance and Sexual Activity   Alcohol use: Yes    Comment: rare use    PHYSICAL EXAM: Vitals:   05/18/21 1145  BP: 128/82  Pulse: 99   Body mass index is 21.28 kg/m. Weight: 56.2 kg (124 lb)   GENERAL: Alert, active, oriented x3  HEENT: Pupils equal reactive to light. Extraocular movements are intact. Sclera clear. Palpebral conjunctiva normal red color.Pharynx clear.  Palpable bilateral temporal artery pulse  NECK: Supple with no palpable mass and no adenopathy.  LUNGS: Sound clear with no rales rhonchi or wheezes.  HEART: Regular rhythm S1 and S2 without murmur.  ABDOMEN: Soft and depressible, nontender  with no palpable mass, no hepatomegaly.   EXTREMITIES: Well-developed well-nourished symmetrical with no dependent edema.  NEUROLOGICAL: Awake alert oriented, facial expression symmetrical, moving all extremities.  REVIEW OF DATA: I have reviewed the following data today: Initial consult on 05/18/2021  Component Date Value   Sedimentation Rate-Autom* 05/18/2021 84 (H)    CK, Total (Creatine Kina* 05/18/2021 46   Ancillary Procedure on 05/16/2021  Component Date Value   LV Ejection Fraction (%) 05/16/2021 55    Aortic Valve Stenosis Gr* 05/16/2021 none    Aortic Valve Regurgitati* 05/16/2021 trivial    Mitral Valve Stenosis Gr* 05/16/2021 none    Mitral Valve Regurgitati* 05/16/2021 none    Tricuspid Valve Regurgit* 05/16/2021 mild    Tricuspid Valve Regurgit* 05/16/2021 2.6    Right Ventricle Systolic*  05/16/2021 30.2    LV End Diastolic Diamete* 53/00/5110 3.4    LV End Systolic Diameter* 21/03/7355 2    LV Septum Wall Thickness* 05/16/2021 1.4    LV Posterior Wall Thickn* 05/16/2021 0.86    Left Atrium Diameter (cm) 05/16/2021 4   Office Visit on 05/09/2021  Component Date Value   WBC (White Blood Cell Co* 05/09/2021 9.7    RBC (Red Blood Cell Coun* 05/09/2021 4.23    Hemoglobin 05/09/2021 12.4    Hematocrit 05/09/2021 38.2    MCV (Mean Corpuscular Vo* 05/09/2021 90.3    MCH (Mean Corpuscular He* 05/09/2021 29.3    MCHC (Mean Corpuscular H* 05/09/2021 32.5    Platelet Count 05/09/2021 563 (H)    RDW-CV (Red Cell Distrib* 05/09/2021 12.1    MPV (Mean Platelet Volum* 05/09/2021 10.3    Neutrophils 05/09/2021 7.08    Lymphocytes 05/09/2021 1.32    Monocytes 05/09/2021 0.94    Eosinophils 05/09/2021 0.23    Basophils 05/09/2021 0.06    Neutrophil % 05/09/2021 73.1 (H)    Lymphocyte % 05/09/2021 13.6    Monocyte % 05/09/2021 9.7    Eosinophil % 05/09/2021 2.4    Basophil% 05/09/2021 0.6    Immature Granulocyte % 05/09/2021 0.6    Immature Granulocyte Cou* 05/09/2021  0.06    Glucose 05/09/2021 102    Sodium 05/09/2021 135 (L)    Potassium 05/09/2021 3.5 (L)    Chloride 05/09/2021 100    Carbon Dioxide (CO2) 05/09/2021 20.6 (L)    Urea Nitrogen (BUN) 05/09/2021 12    Creatinine 05/09/2021 1.5 (H)    Glomerular Filtration Ra* 05/09/2021 33 (L)    Calcium 05/09/2021 9.9    AST  05/09/2021 26    ALT  05/09/2021 19    Alk Phos (alkaline Phosp* 05/09/2021 103    Albumin 05/09/2021 3.9    Bilirubin, Total 05/09/2021 0.5    Protein, Total 05/09/2021 8.1 (H)    A/G Ratio 05/09/2021 0.9 (L)    Thyroid Stimulating Horm* 05/09/2021 4.337    Color 05/09/2021 Yellow    Clarity 05/09/2021 Clear    Specific Gravity 05/09/2021 1.015    pH, Urine 05/09/2021 6.0    Protein, Urinalysis 05/09/2021 Trace    Glucose, Urinalysis 05/09/2021 Negative    Ketones, Urinalysis 05/09/2021 Negative    Blood, Urinalysis 05/09/2021 Negative    Nitrite, Urinalysis 05/09/2021 Negative    Leukocyte Esterase, Urin* 05/09/2021 Small (!)    White Blood Cells, Urina* 05/09/2021 0-3    Red Blood Cells, Urinaly* 05/09/2021 None Seen    Bacteria, Urinalysis 05/09/2021 Rare (!)    Squamous Epithelial Cell* 05/09/2021 Few    Sedimentation Rate-Autom* 05/09/2021 119 (H)    BNP - [facility] 05/09/2021 310.9 (H)   Office Visit on 05/05/2021  Component Date Value   WBC (White Blood Cell Co* 05/05/2021 10.3 (H)    RBC (Red Blood Cell Coun* 05/05/2021 3.99 (L)    Hemoglobin 05/05/2021 11.9 (L)    Hematocrit 05/05/2021 36.4    MCV (Mean Corpuscular Vo* 05/05/2021 91.2    MCH (Mean Corpuscular He* 05/05/2021 29.8    MCHC (Mean Corpuscular H* 05/05/2021 32.7    Platelet Count 05/05/2021 402    RDW-CV (Red Cell Distrib* 05/05/2021 12.0    MPV (Mean Platelet Volum* 05/05/2021 9.9    Neutrophils 05/05/2021 8.19 (H)    Lymphocytes 05/05/2021 0.91 (L)    Monocytes 05/05/2021 0.94    Eosinophils 05/05/2021 0.11    Basophils 05/05/2021 0.07  Neutrophil % 05/05/2021 79.7 (H)    Lymphocyte %  05/05/2021 8.9 (L)    Monocyte % 05/05/2021 9.2    Eosinophil % 05/05/2021 1.1    Basophil% 05/05/2021 0.7    Immature Granulocyte % 05/05/2021 0.4    Immature Granulocyte Cou* 05/05/2021 0.04    Glucose 05/05/2021 104    Sodium 05/05/2021 136    Potassium 05/05/2021 4.0    Chloride 05/05/2021 100    Carbon Dioxide (CO2) 05/05/2021 24.9    Urea Nitrogen (BUN) 05/05/2021 20    Creatinine 05/05/2021 1.6 (H)    Glomerular Filtration Ra* 05/05/2021 31 (L)    Calcium 05/05/2021 9.6    AST  05/05/2021 32    ALT  05/05/2021 22    Alk Phos (alkaline Phosp* 05/05/2021 103    Albumin 05/05/2021 3.7    Bilirubin, Total 05/05/2021 0.7    Protein, Total 05/05/2021 7.8    A/G Ratio 05/05/2021 0.9 (L)    Color 05/05/2021 Yellow    Clarity 05/05/2021 Clear    Specific Gravity 05/05/2021 1.015    pH, Urine 05/05/2021 7.0    Protein, Urinalysis 05/05/2021 30 (!)    Glucose, Urinalysis 05/05/2021 Negative    Ketones, Urinalysis 05/05/2021 15 (!)    Blood, Urinalysis 05/05/2021 Negative    Nitrite, Urinalysis 05/05/2021 Negative    Leukocyte Esterase, Urin* 05/05/2021 Small (!)    White Blood Cells, Urina* 05/05/2021 0-3    Red Blood Cells, Urinaly* 05/05/2021 None Seen    Bacteria, Urinalysis 05/05/2021 Rare (!)    Squamous Epithelial Cell* 05/05/2021 Rare      ASSESSMENT: Ms. Venneman is a 88 y.o. female presenting for consultation for temporal artery biopsy.  Patient with bilateral headache and tightness.  Patient describes discomfort more on the left than on the right.  She also had elevated sed rate up to 119.  She was evaluated by rheumatology and temporal arteritis was of concern.  She was started on prednisone therapy.  It was recommended to proceed with temporal artery biopsy.  I discussed with the patient the procedure of temporal artery biopsy.  I discussed with patient and her husband the risks of the procedure that includes bleeding, infection, headache, injury to adjacent organs  such as nerves, among others.  They report understood and agreed to proceed.  Temporal arteritis (CMS-HCC) [M31.6]  PLAN: Temporal artery biopsy ([id number]) Contact us if you have any concern  Patient and her husband verbalized understanding, all questions were answered, and were agreeable with the plan outlined above.    Herbert Pun, MD  Electronically signed by Herbert Pun, MD

## 2021-05-19 ENCOUNTER — Other Ambulatory Visit: Payer: Self-pay

## 2021-05-19 ENCOUNTER — Ambulatory Visit: Payer: PPO | Admitting: Registered Nurse

## 2021-05-19 ENCOUNTER — Encounter
Admission: RE | Disposition: A | Discharge status: Home / Self Care | Payer: Self-pay | Source: Home / Self Care | Attending: General Surgery

## 2021-05-19 ENCOUNTER — Ambulatory Visit
Admission: RE | Admit: 2021-05-19 | Discharge: 2021-05-19 | Disposition: A | Discharge status: Home / Self Care | Payer: PPO | Attending: General Surgery | Admitting: General Surgery

## 2021-05-19 ENCOUNTER — Encounter: Payer: Self-pay | Admitting: General Surgery

## 2021-05-19 DIAGNOSIS — N183 Chronic kidney disease, stage 3 unspecified: Secondary | ICD-10-CM | POA: Insufficient documentation

## 2021-05-19 DIAGNOSIS — Z7952 Long term (current) use of systemic steroids: Secondary | ICD-10-CM | POA: Insufficient documentation

## 2021-05-19 DIAGNOSIS — M316 Other giant cell arteritis: Secondary | ICD-10-CM | POA: Diagnosis not present

## 2021-05-19 DIAGNOSIS — I129 Hypertensive chronic kidney disease with stage 1 through stage 4 chronic kidney disease, or unspecified chronic kidney disease: Secondary | ICD-10-CM | POA: Diagnosis not present

## 2021-05-19 DIAGNOSIS — E039 Hypothyroidism, unspecified: Secondary | ICD-10-CM | POA: Insufficient documentation

## 2021-05-19 DIAGNOSIS — R519 Headache, unspecified: Secondary | ICD-10-CM | POA: Diagnosis present

## 2021-05-19 HISTORY — PX: ARTERY BIOPSY: SHX891

## 2021-05-19 SURGERY — BIOPSY TEMPORAL ARTERY
Anesthesia: General | Site: Head

## 2021-05-19 MED ORDER — PHENYLEPHRINE 40 MCG/ML (10ML) SYRINGE FOR IV PUSH (FOR BLOOD PRESSURE SUPPORT)
PREFILLED_SYRINGE | INTRAVENOUS | Status: DC | PRN
  Administered 2021-05-19 (×2): 80 ug via INTRAVENOUS

## 2021-05-19 MED ORDER — PROPOFOL 10 MG/ML IV BOLUS
INTRAVENOUS | Status: DC | PRN
Start: 2021-05-19 — End: 2021-05-19
  Administered 2021-05-19 (×2): 20 mg via INTRAVENOUS
  Administered 2021-05-19: 30 mg via INTRAVENOUS

## 2021-05-19 MED ORDER — OXYCODONE HCL 5 MG/5ML PO SOLN: 5.0000 mg | Freq: Once | ORAL | Status: DC | PRN

## 2021-05-19 MED ORDER — DEXMEDETOMIDINE HCL IN NACL 200 MCG/50ML IV SOLN
INTRAVENOUS | Status: DC | PRN
Start: 2021-05-19 — End: 2021-05-19
  Administered 2021-05-19: 8 ug via INTRAVENOUS
  Administered 2021-05-19: 4 ug via INTRAVENOUS

## 2021-05-19 MED ORDER — FENTANYL CITRATE (PF) 100 MCG/2ML IJ SOLN: 25.0000 ug | INTRAMUSCULAR | Status: DC | PRN

## 2021-05-19 MED ORDER — LIDOCAINE-EPINEPHRINE 1 %-1:100000 IJ SOLN
INTRAMUSCULAR | Status: AC
  Filled 2021-05-19: qty 1

## 2021-05-19 MED ORDER — PROPOFOL 500 MG/50ML IV EMUL
INTRAVENOUS | Status: DC | PRN
  Administered 2021-05-19: 30 ug/kg/min via INTRAVENOUS

## 2021-05-19 MED ORDER — PROPOFOL 1000 MG/100ML IV EMUL
INTRAVENOUS | Status: AC
  Filled 2021-05-19: qty 100

## 2021-05-19 MED ORDER — CHLORHEXIDINE GLUCONATE 0.12 % MT SOLN
OROMUCOSAL | Status: AC
  Administered 2021-05-19: 15 mL via OROMUCOSAL
  Filled 2021-05-19: qty 15

## 2021-05-19 MED ORDER — LIDOCAINE-EPINEPHRINE (PF) 1 %-1:200000 IJ SOLN
INTRAMUSCULAR | Status: DC | PRN
  Administered 2021-05-19: 3 mL

## 2021-05-19 MED ORDER — CEFAZOLIN SODIUM-DEXTROSE 2-4 GM/100ML-% IV SOLN
INTRAVENOUS | Status: AC
  Filled 2021-05-19: qty 100

## 2021-05-19 MED ORDER — OXYCODONE HCL 5 MG PO TABS: 5.0000 mg | ORAL_TABLET | Freq: Once | ORAL | Status: DC | PRN

## 2021-05-19 MED ORDER — ORAL CARE MOUTH RINSE: 15.0000 mL | Freq: Once | OROMUCOSAL | Status: AC

## 2021-05-19 MED ORDER — HYDROCODONE-ACETAMINOPHEN 5-325 MG PO TABS: 1.0000 | ORAL_TABLET | ORAL | 0 refills | Status: AC | PRN

## 2021-05-19 MED ORDER — CEFAZOLIN SODIUM-DEXTROSE 2-4 GM/100ML-% IV SOLN
2.0000 g | INTRAVENOUS | Status: AC
  Administered 2021-05-19: 2 g via INTRAVENOUS

## 2021-05-19 MED ORDER — LACTATED RINGERS IV SOLN: INTRAVENOUS | Status: DC

## 2021-05-19 MED ORDER — CHLORHEXIDINE GLUCONATE 0.12 % MT SOLN: 15.0000 mL | Freq: Once | OROMUCOSAL | Status: AC

## 2021-05-19 SURGICAL SUPPLY — 46 items
BLADE SURG 15 STRL LF DISP TIS (BLADE) ×1 IMPLANT
BLADE SURG 15 STRL SS (BLADE) ×1
BLADE SURG SZ11 CARB STEEL (BLADE) ×2 IMPLANT
CHLORAPREP W/TINT 26 (MISCELLANEOUS) ×1 IMPLANT
CNTNR SPEC 2.5X3XGRAD LEK (MISCELLANEOUS)
CONT SPEC 4OZ STER OR WHT (MISCELLANEOUS)
CONTAINER SPEC 2.5X3XGRAD LEK (MISCELLANEOUS) IMPLANT
COTTON BALL STRL MEDIUM (GAUZE/BANDAGES/DRESSINGS) ×2 IMPLANT
DERMABOND ADVANCED (GAUZE/BANDAGES/DRESSINGS) ×1
DERMABOND ADVANCED .7 DNX12 (GAUZE/BANDAGES/DRESSINGS) ×1 IMPLANT
DRAPE LAPAROTOMY 77X122 PED (DRAPES) ×2 IMPLANT
DRSG TELFA 4X3 1S NADH ST (GAUZE/BANDAGES/DRESSINGS) ×2 IMPLANT
ELECT CAUTERY BLADE 6.4 (BLADE) ×2 IMPLANT
ELECT REM PT RETURN 9FT ADLT (ELECTROSURGICAL) ×2
ELECTRODE REM PT RTRN 9FT ADLT (ELECTROSURGICAL) ×1 IMPLANT
GAUZE 4X4 16PLY ~~LOC~~+RFID DBL (SPONGE) ×2 IMPLANT
GLOVE SURG ENC MOIS LTX SZ6.5 (GLOVE) ×2 IMPLANT
GLOVE SURG UNDER POLY LF SZ6.5 (GLOVE) ×2 IMPLANT
GOWN STRL REUS W/ TWL LRG LVL3 (GOWN DISPOSABLE) ×1 IMPLANT
GOWN STRL REUS W/ TWL XL LVL3 (GOWN DISPOSABLE) ×2 IMPLANT
GOWN STRL REUS W/TWL LRG LVL3 (GOWN DISPOSABLE) ×1
GOWN STRL REUS W/TWL XL LVL3 (GOWN DISPOSABLE) ×2
KIT TURNOVER KIT A (KITS) ×2 IMPLANT
LABEL OR SOLS (LABEL) ×2 IMPLANT
MANIFOLD NEPTUNE II (INSTRUMENTS) ×2 IMPLANT
NDL HYPO 25X1 1.5 SAFETY (NEEDLE) IMPLANT
NEEDLE HYPO 25X1 1.5 SAFETY (NEEDLE) IMPLANT
NS IRRIG 500ML POUR BTL (IV SOLUTION) ×2 IMPLANT
PACK BASIN MINOR ARMC (MISCELLANEOUS) ×2 IMPLANT
SOL PREP PVP 2OZ (MISCELLANEOUS)
SOLUTION PREP PVP 2OZ (MISCELLANEOUS) ×1 IMPLANT
SUCTION FRAZIER HANDLE 10FR (MISCELLANEOUS) ×1
SUCTION TUBE FRAZIER 10FR DISP (MISCELLANEOUS) ×1 IMPLANT
SUT MNCRL AB 4-0 PS2 18 (SUTURE) ×2 IMPLANT
SUT SILK 2 0 (SUTURE) ×1
SUT SILK 2-0 18XBRD TIE 12 (SUTURE) ×1 IMPLANT
SUT SILK 3 0 (SUTURE) ×1
SUT SILK 3-0 18XBRD TIE 12 (SUTURE) ×1 IMPLANT
SUT SILK 4 0 (SUTURE) ×1
SUT SILK 4-0 18XBRD TIE 12 (SUTURE) ×1 IMPLANT
SUT VIC AB 3-0 SH 27 (SUTURE) ×1
SUT VIC AB 3-0 SH 27X BRD (SUTURE) ×1 IMPLANT
SYR 10ML LL (SYRINGE) IMPLANT
SYR BULB IRRIG 60ML STRL (SYRINGE) ×2 IMPLANT
TAPE TRANSPORE STRL 2 31045 (GAUZE/BANDAGES/DRESSINGS) ×1 IMPLANT
WATER STERILE IRR 500ML POUR (IV SOLUTION) ×2 IMPLANT

## 2021-05-19 NOTE — Anesthesia Postprocedure Evaluation (Signed)
Anesthesia Post Note  Patient: Amy Hensley  Procedure(s) Performed: BIOPSY TEMPORAL ARTERY (Head)  Patient location during evaluation: PACU Anesthesia Type: General Level of consciousness: awake and alert Pain management: pain level controlled Vital Signs Assessment: post-procedure vital signs reviewed and stable Respiratory status: spontaneous breathing, nonlabored ventilation, respiratory function stable and patient connected to nasal cannula oxygen Cardiovascular status: blood pressure returned to baseline and stable Postop Assessment: no apparent nausea or vomiting Anesthetic complications: no   No notable events documented.   Last Vitals:  Vitals:   05/19/21 1415 05/19/21 1430  BP: 104/61 128/72  Pulse: 73 75  Resp: 14 20  Temp:    SpO2: 94% 93%    Last Pain:  Vitals:   05/19/21 1430  TempSrc:   PainSc: 0-No pain                 Precious Haws Elidia Bonenfant

## 2021-05-19 NOTE — Anesthesia Preprocedure Evaluation (Signed)
Anesthesia Evaluation  Patient identified by MRN, date of birth, ID band Patient awake    Reviewed: Allergy & Precautions, NPO status , Patient's Chart, lab work & pertinent test results  History of Anesthesia Complications Negative for: history of anesthetic complications  Airway Mallampati: III  TM Distance: <3 FB Neck ROM: full    Dental  (+) Chipped   Pulmonary neg shortness of breath, Current Smoker,    Pulmonary exam normal        Cardiovascular Exercise Tolerance: Good hypertension, Normal cardiovascular exam     Neuro/Psych negative neurological ROS  negative psych ROS   GI/Hepatic negative GI ROS, Neg liver ROS, neg GERD  ,  Endo/Other  Hypothyroidism   Renal/GU Renal disease  negative genitourinary   Musculoskeletal  (+) Arthritis ,   Abdominal   Peds  Hematology negative hematology ROS (+)   Anesthesia Other Findings Past Medical History: No date: Arthritis No date: Chronic kidney disease     Comment:  stage 3 No date: Hypertension No date: Hypothyroidism  Past Surgical History: No date: ABDOMINAL HYSTERECTOMY No date: ABDOMINAL SURGERY     Comment:  tacked up uterus No date: APPENDECTOMY 10/04/2016: CATARACT EXTRACTION W/PHACO; Left     Comment:  Procedure: CATARACT EXTRACTION PHACO AND INTRAOCULAR               LENS PLACEMENT (IOC);  Surgeon: Eulogio Bear, MD;                Location: [facility] ORS;  Service: Ophthalmology;  Laterality:              Left;  us02:17.8 ap%19.6 cde27.31 fluid lot # [id number] No date: CESAREAN SECTION No date: EYE SURGERY 12/08/2018: INGUINAL HERNIA REPAIR; Right     Comment:  Procedure: OPEN RIGHT HERNIA REPAIR INGUINAL ADULT;                Surgeon: Herbert Pun, MD;  Location: [facility] ORS;               Service: General;  Laterality: Right;  BMI    Body Mass Index: 22.14 kg/m      Reproductive/Obstetrics negative OB ROS                              Anesthesia Physical Anesthesia Plan  ASA: 3  Anesthesia Plan: General   Post-op Pain Management:    Induction: Intravenous  PONV Risk Score and Plan: Propofol infusion and TIVA  Airway Management Planned: Natural Airway and Nasal Cannula  Additional Equipment:   Intra-op Plan:   Post-operative Plan:   Informed Consent: I have reviewed the patients History and Physical, chart, labs and discussed the procedure including the risks, benefits and alternatives for the proposed anesthesia with the patient or authorized representative who has indicated his/her understanding and acceptance.     Dental Advisory Given  Plan Discussed with: Anesthesiologist, CRNA and Surgeon  Anesthesia Plan Comments: (Patient consented for risks of anesthesia including but not limited to:  - adverse reactions to medications - risk of airway placement if required - damage to eyes, teeth, lips or other oral mucosa - nerve damage due to positioning  - sore throat or hoarseness - Damage to heart, brain, nerves, lungs, other parts of body or loss of life  Patient voiced understanding.)        Anesthesia Quick Evaluation

## 2021-05-19 NOTE — Interval H&P Note (Signed)
History and Physical Interval Note:  05/19/2021 1:06 PM  Amy Hensley  has presented today for surgery, with the diagnosis of temporal arteritis.  The various methods of treatment have been discussed with the patient and family. After consideration of risks, benefits and other options for treatment, the patient has consented to  Procedure(s): BIOPSY TEMPORAL ARTERY (N/A) as a surgical intervention.  The patient's history has been reviewed, patient examined, no change in status, stable for surgery.  I have reviewed the patient's chart and labs.  Questions were answered to the patient's satisfaction.     Herbert Pun

## 2021-05-19 NOTE — Transfer of Care (Signed)
Immediate Anesthesia Transfer of Care Note  Patient: Amy Hensley  Procedure(s) Performed: BIOPSY TEMPORAL ARTERY (Head)  Patient Location: PACU  Anesthesia Type:MAC  Level of Consciousness: drowsy  Airway & Oxygen Therapy: Patient Spontanous Breathing and Patient connected to nasal cannula oxygen  Post-op Assessment: Report given to RN and Post -op Vital signs reviewed and stable  Post vital signs: Reviewed and stable  Last Vitals:  Vitals Value Taken Time  BP 94/59 05/19/21 1405  Temp 36.6 C 05/19/21 1405  Pulse 68 05/19/21 1405  Resp 21 05/19/21 1405  SpO2 94 % 05/19/21 1405  Vitals shown include unvalidated device data.  Last Pain:  Vitals:   05/19/21 1211  TempSrc: Oral  PainSc: 0-No pain         Complications: No notable events documented.

## 2021-05-19 NOTE — Discharge Instructions (Addendum)
°  Diet: Resume home heart healthy regular diet.   Activity: Increase activity as tolerated. Light activity and walking are encouraged. Do not drive or drink alcohol if taking narcotic pain medications.  Wound care: May shower with soapy water and pat dry (do not rub incisions), but no baths or submerging incision underwater until follow-up. (no swimming)   Medications: Resume all home medications. For mild to moderate pain: acetaminophen (Tylenol) or ibuprofen (if no kidney disease). Combining Tylenol with alcohol can substantially increase your risk of causing liver disease. Narcotic pain medications, if prescribed, can be used for severe pain, though may cause nausea, constipation, and drowsiness. Do not combine Tylenol and Norco within a 6 hour period as Norco contains Tylenol. If you do not need the narcotic pain medication, you do not need to fill the prescription.  Call office ([phone]) at any time if any questions, worsening pain, fevers/chills, bleeding, drainage from incision site, or other concerns.   AMBULATORY SURGERY  DISCHARGE INSTRUCTIONS   The drugs that you were given will stay in your system until tomorrow so for the next 24 hours you should not:  Drive an automobile Make any legal decisions Drink any alcoholic beverage   You may resume regular meals tomorrow.  Today it is better to start with liquids and gradually work up to solid foods.  You may eat anything you prefer, but it is better to start with liquids, then soup and crackers, and gradually work up to solid foods.   Please notify your doctor immediately if you have any unusual bleeding, trouble breathing, redness and pain at the surgery site, drainage, fever, or pain not relieved by medication.    Additional Instructions:        Please contact your physician with any problems or Same Day Surgery at [phone], Monday through Friday 6 am to 4 pm, or [place] [facility] [place] number at  [phone]. 

## 2021-05-19 NOTE — Op Note (Signed)
OPERATION REPORT  Pre Op Diagnosis: Left temporal headache  Post Op Diagnosis: Same  Procedure: Left Temporal artery biopsy  Anesthesia: Local and sedation  Surgeon: Dr. Windell Moment  Indications: This 88 y.o. year old female has headache and tightnes with elevated sedimentation rate greater than 50 mm/h with left temporal artery tenderness to palpation and reduced pulsation. The patient has been informed that a temporal artery biopsy is required to confirm the diagnosis of temporal arteritis. The risks of the procedure were explained to the patient who elected to proceed with the biopsy.   Description of procedure: The patient was placed in the supine position with the head turned so the operative side is up. A time-out was completed verifying correct patient, procedure, site, positioning, and implant(s) and/or special equipment prior to beginning this procedure. The left temporal area was prepped and draped in the usual sterile fashion. Local anesthetics (1% Lidocaine) without epinephrine to minimize arterial spasm were injected. A 3.5 cm incision was made directly over the artery through the skin and subcutaneous tissue using a #15 blade scalpel. The temporal artery was identified in the superficial layers of the superficial temporal fascia. Blunt dissection with a hemostat was performed parallel to the vessel to avoid tearing it. The dissection proceeded beneath the vessel so that a hemostat was passed below it. After the vessel was isolated, 4-0 silk sutures were passed around the proximal and distal portions of the isolated artery and tied. Branches of the main artery were also ligated. The vessel was then transected. After ensuring hemostasis, the subcutaneous tissues were closed using interrupted 4-0 Vicryl sutures. The skin was closed with a running subcuticular 4-0 Monocryl sutures. Dermabond applied over the wound and covered with sterile dressings.  The patient tolerated well the  procedure.  The specimen was sent to the pathologist.   Specimen: Left Tempora Artery  Herbert Pun, MD, FACS

## 2021-05-20 ENCOUNTER — Encounter: Payer: Self-pay | Admitting: General Surgery

## 2021-05-24 ENCOUNTER — Other Ambulatory Visit: Payer: Self-pay

## 2021-05-24 ENCOUNTER — Ambulatory Visit
Admission: RE | Admit: 2021-05-24 | Discharge: 2021-05-24 | Disposition: A | Discharge status: Home / Self Care | Payer: PPO | Source: Ambulatory Visit | Attending: Otolaryngology | Admitting: Otolaryngology

## 2021-05-24 DIAGNOSIS — R42 Dizziness and giddiness: Secondary | ICD-10-CM | POA: Diagnosis not present

## 2021-05-24 DIAGNOSIS — J341 Cyst and mucocele of nose and nasal sinus: Secondary | ICD-10-CM | POA: Diagnosis not present

## 2021-05-24 LAB — SURGICAL PATHOLOGY

## 2021-05-24 IMAGING — MR MR BRAIN/TEMPORAL BONE/IAC
10 of 14 series · 27 of 48 positions shown · IV contrast (gadavist)
Comparison: None.

CLINICAL DATA: Dizziness since [DATE], left-sided temporal
artery biopsy on [DATE].

EXAM:
MRI HEAD WITHOUT AND WITH CONTRAST
TECHNIQUE: Multiplanar, multiecho pulse sequences of the brain and surrounding
structures were obtained without and with intravenous contrast.
CONTRAST:  6mL GADAVIST GADOBUTROL 1 MMOL/ML IV SOLN

[Series 5: T1 · sagittal · 5.0mm · 0.62mm/px · 3 of 23 slices shown (1 of 3)]
[im 1/23]
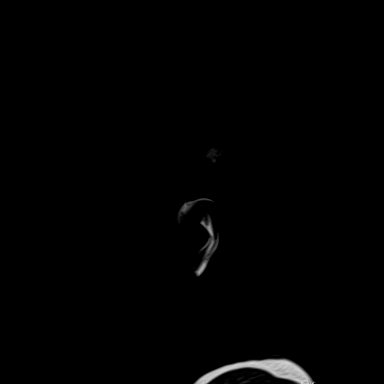
[im 12/23]
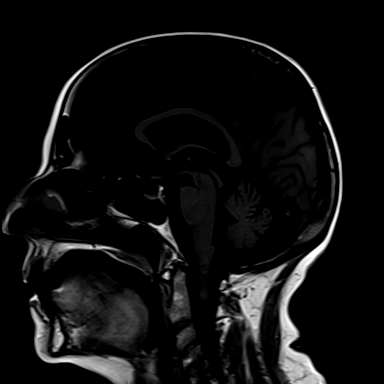
[im 23/23]
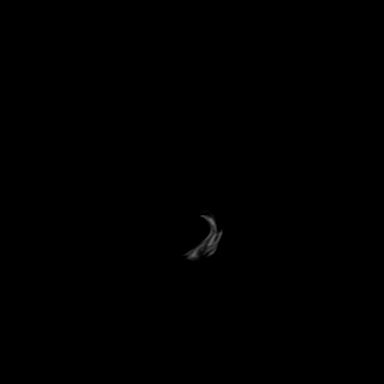

[Series 6: ax dwi_tracew · axial · 3.0mm · 0.65mm/px · z∈[-59,+90]mm · 3 of 48 slices shown]
[im 1/48]
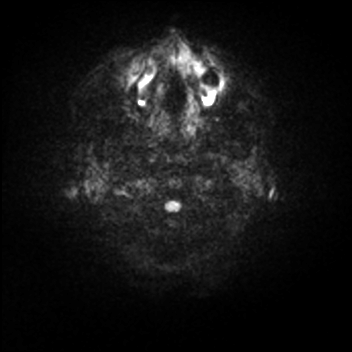
[im 24/48]
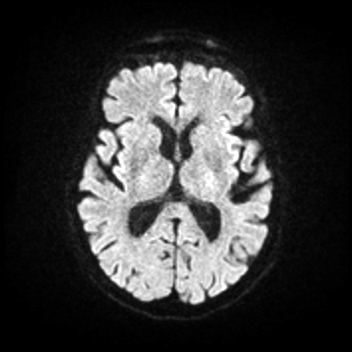
[im 48/48]
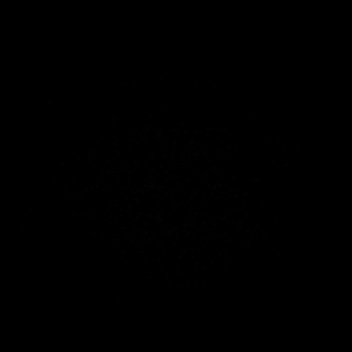

[Series 7: ax dwi_adc · axial · 3.0mm · 0.65mm/px · z∈[-59,+11]mm · 2 of 45 slices shown]
[im 1/45]
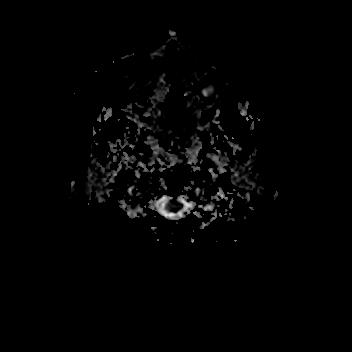
[im 23/45]
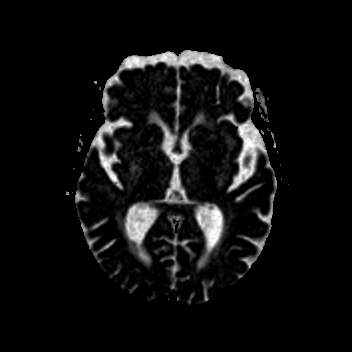

[Series 8: T2 · axial · 5.0mm · 0.53mm/px · z∈[-54,+83]mm · 2 of 25 slices shown]
[im 1/25]
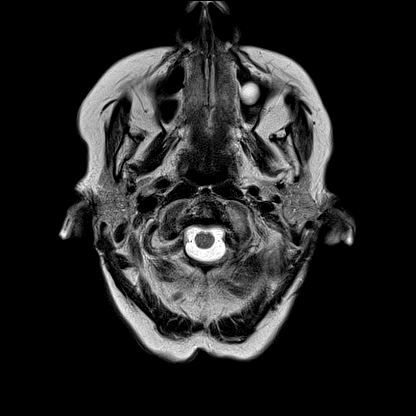
[im 25/25]
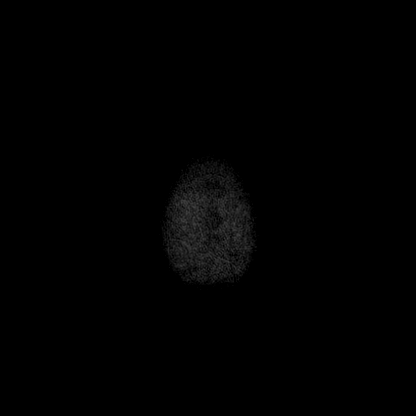

[Series 13: FLAIR · axial · 3.0mm · 0.53mm/px · z∈[-63,+92]mm · 4 of 55 slices shown]
[im 1/55]
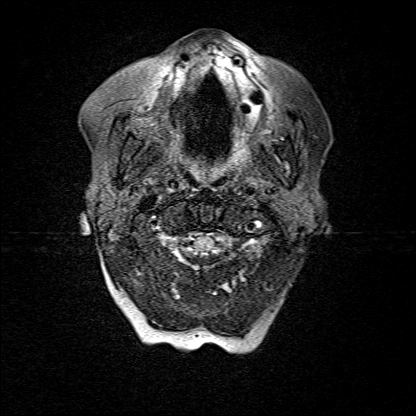
[im 19/55]
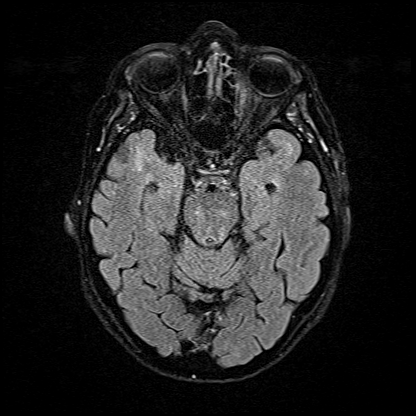
[im 37/55]
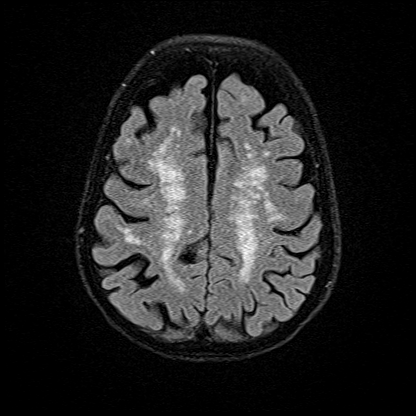
[im 55/55]
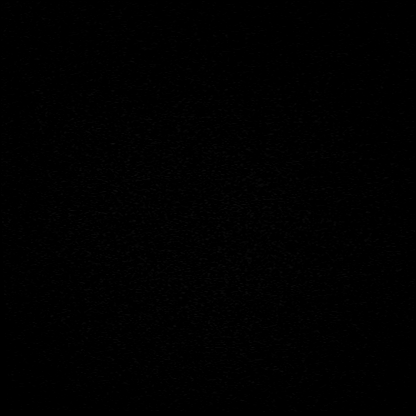

[Series 14: T1 · coronal · non-contrast · 3.0mm · 0.21mm/px · 1 of 13 slices shown (2 of 3)]
[im 1/13]
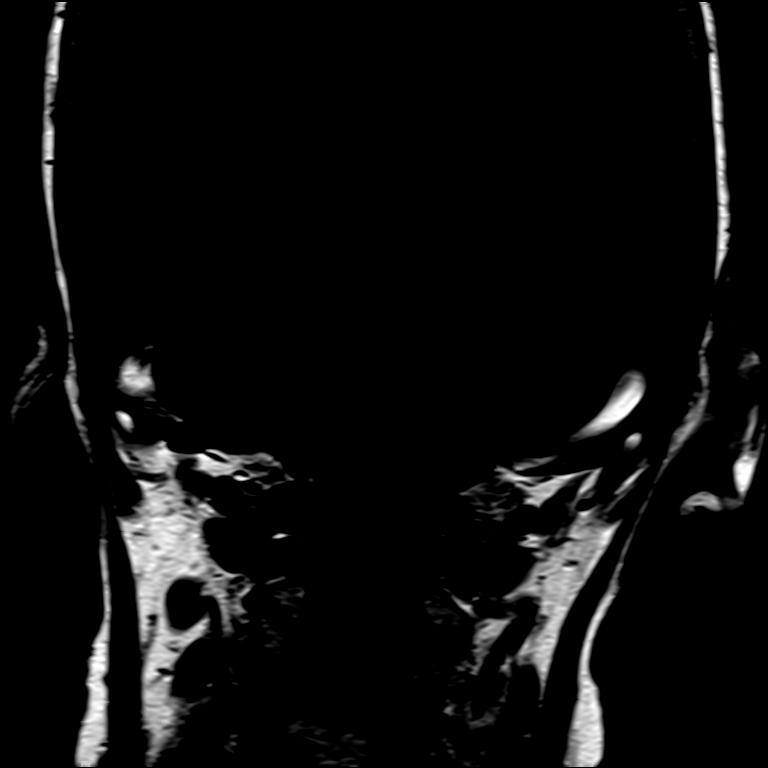

[Series 16: T1 · axial · non-contrast · 3.0mm · 0.21mm/px · 1 of 15 slices shown (3 of 3)]
[im 1/15]
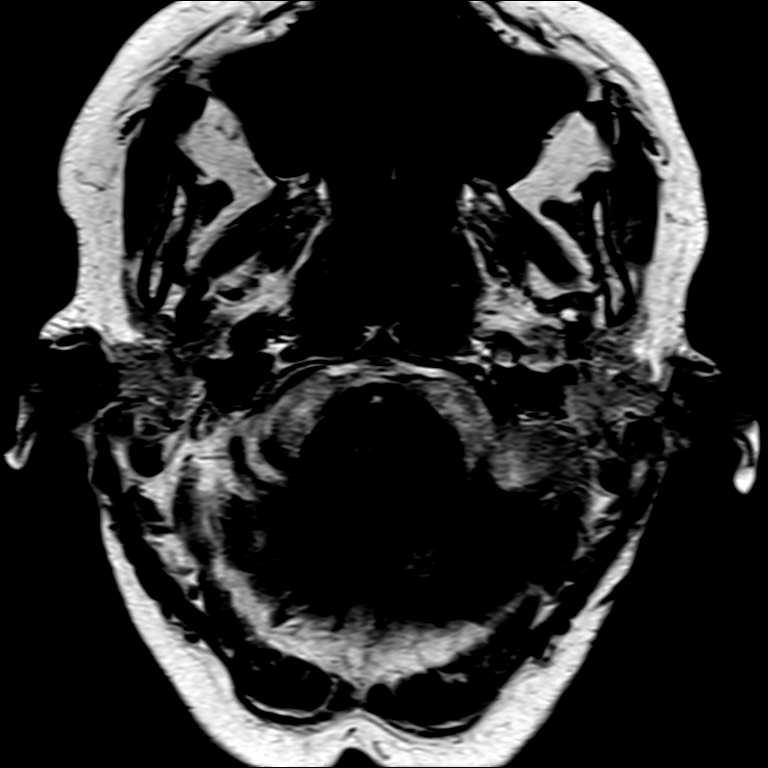

[Series 17: T1 post-contrast · axial · 3.0mm · 0.21mm/px · 1 of 15 slices shown (1 of 3)]
[im 1/15]
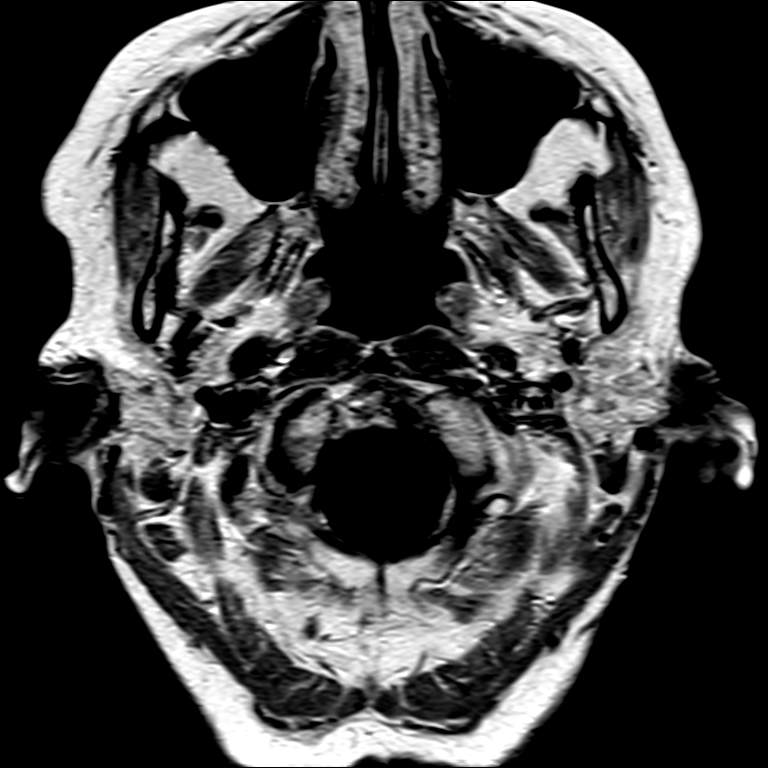

[Series 18: T1 post-contrast · coronal · 3.0mm · 0.21mm/px · 1 of 13 slices shown (2 of 3)]
[im 1/13]
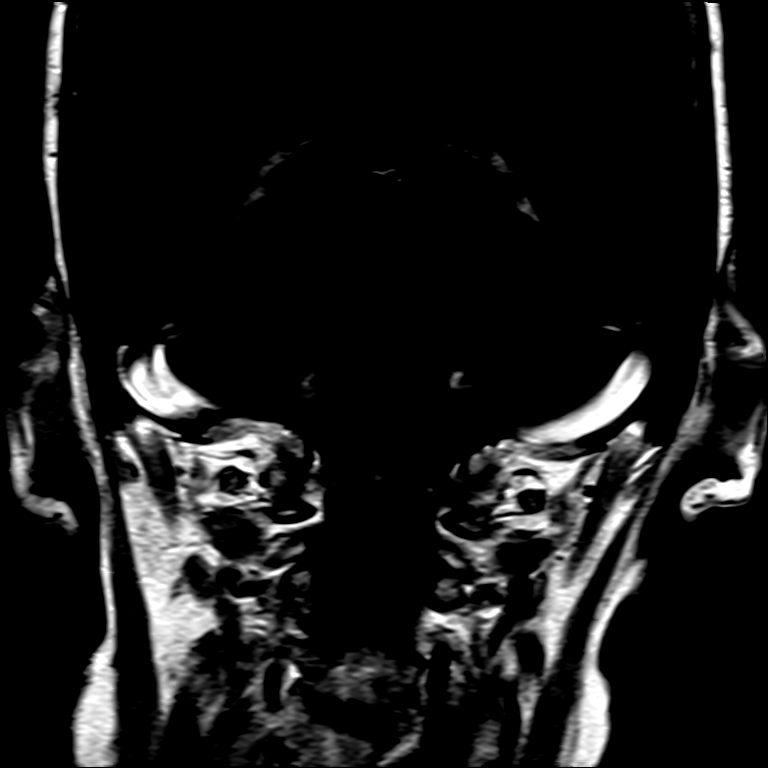

[Series 19: T1 post-contrast · axial · 1.0mm · 0.98mm/px · z∈[-66,+102]mm · 9 of 176 slices shown (3 of 3)]
[im 1/176]
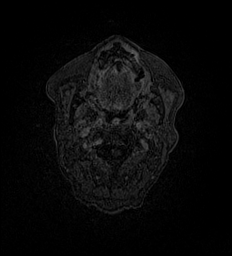
[im 30/176]
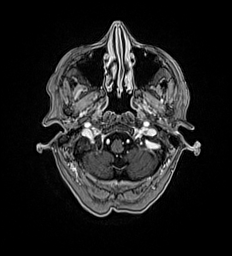
[im 59/176]
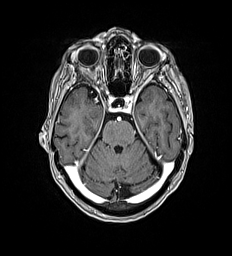
[im 73/176]
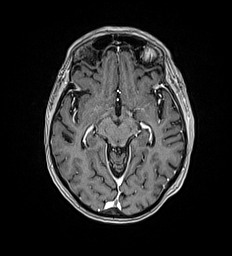
[im 88/176]
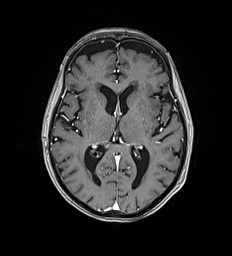
[im 103/176]
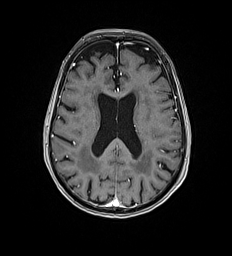
[im 117/176]
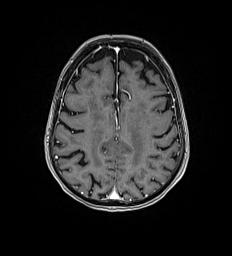
[im 146/176]
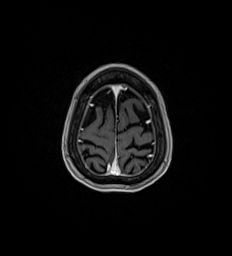
[im 176/176]
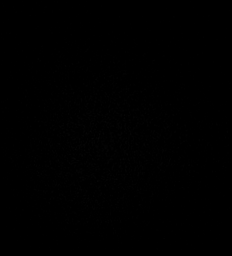

[27 of 48 positions shown; findings below may reference images not displayed]

FINDINGS: Brain: No restricted diffusion to suggest acute or subacute infarct.
No acute hemorrhage, mass, mass effect, or midline shift. No
hydrocephalus or extra-axial collection. Confluent T2 hyperintense
signal in the periventricular white matter, likely the sequela of
severe chronic small vessel ischemic disease. No foci of hemosiderin
deposition to suggest remote hemorrhage.

Cranial Nerves VII and VIII: Normal bilaterally, without evidence of
mass or abnormal enhancement.

Cochleae: Normal bilaterally.

Semicircular Canals: Normal bilaterally.

Porus Acusticus: Normal bilaterally.

Cerebellopontine Angle: Normal bilaterally, without evidence of
mass.

Vestibular Aqueducts: Normal bilaterally.

Vascular: Normal flow voids.

Skull and upper cervical spine: Normal marrow signal.

Sinuses/Orbits: Layering fluid in the right sphenoid sinus. Minimal
mucosal thickening in mucous retention cysts in the left maxillary
sinus. The sinuses are otherwise clear. Status post bilateral lens
replacements. The orbits are otherwise unremarkable.

Other: Trace fluid in the left mastoid air cells. No abnormal
enhancement.
IMPRESSION: 1. No acute process in the brain or IAC's. No etiology is seen for
the patient's dizziness.

2. Fluid in the right sphenoid sinus, which is nonspecific but can
be seen in the setting of acute sinusitis. Correlate with symptoms.

## 2021-05-24 MED ORDER — GADOBUTROL 1 MMOL/ML IV SOLN
6.0000 mL | Freq: Once | INTRAVENOUS | Status: AC | PRN
  Administered 2021-05-24: 6 mL via INTRAVENOUS

## 2021-06-01 ENCOUNTER — Ambulatory Visit: Payer: PPO | Attending: Infectious Diseases | Admitting: Infectious Diseases

## 2021-06-01 ENCOUNTER — Other Ambulatory Visit: Payer: Self-pay

## 2021-06-01 ENCOUNTER — Other Ambulatory Visit
Admission: RE | Admit: 2021-06-01 | Discharge: 2021-06-01 | Disposition: A | Discharge status: Home / Self Care | Payer: PPO | Attending: Infectious Diseases | Admitting: Infectious Diseases

## 2021-06-01 VITALS — BP 131/83 | HR 79 | Resp 16 | Ht 63.5 in | Wt 128.0 lb

## 2021-06-01 DIAGNOSIS — Z79890 Hormone replacement therapy (postmenopausal): Secondary | ICD-10-CM | POA: Insufficient documentation

## 2021-06-01 DIAGNOSIS — R768 Other specified abnormal immunological findings in serum: Secondary | ICD-10-CM | POA: Diagnosis not present

## 2021-06-01 DIAGNOSIS — M316 Other giant cell arteritis: Secondary | ICD-10-CM | POA: Insufficient documentation

## 2021-06-01 DIAGNOSIS — E039 Hypothyroidism, unspecified: Secondary | ICD-10-CM | POA: Diagnosis not present

## 2021-06-01 DIAGNOSIS — N183 Chronic kidney disease, stage 3 unspecified: Secondary | ICD-10-CM | POA: Diagnosis not present

## 2021-06-01 DIAGNOSIS — I129 Hypertensive chronic kidney disease with stage 1 through stage 4 chronic kidney disease, or unspecified chronic kidney disease: Secondary | ICD-10-CM | POA: Insufficient documentation

## 2021-06-01 DIAGNOSIS — Z7952 Long term (current) use of systemic steroids: Secondary | ICD-10-CM | POA: Diagnosis not present

## 2021-06-01 LAB — HEPATIC FUNCTION PANEL
ALT: 27 U/L (ref 0–44)
AST: 28 U/L (ref 15–41)
Albumin: 3.8 g/dL (ref 3.5–5.0)
Alkaline Phosphatase: 83 U/L (ref 38–126)
Bilirubin, Direct: <0.1 mg/dL (ref 0.0–0.2)
Total Bilirubin: 0.6 mg/dL (ref 0.3–1.2)
Total Protein: 7.7 g/dL (ref 6.5–8.1)

## 2021-06-01 NOTE — Patient Instructions (Signed)
You are referred to me for hepc Antibody being positive in your blood- today we will do hepc RNA to see whether you have an active infection or not. Will follow up with the results

## 2021-06-01 NOTE — Progress Notes (Signed)
NAME: Amy Hensley  DOB: [birth year]  MRN: [id number]  Date/Time: 06/01/2021 10:26 AM  REQUESTING PROVIDER Subjective:  REASON FOR CONSULT:  ? Amy Hensley is a 88 y.o. 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 as she was having headache for 2 months and thought she had the flu, esr was high, started on prednisone and then had biopsy on 05/19/21 which confirmed GCA being followed by rheumatologist- she is on prednisone 62m As part of the work up hepatitis panel and quant gold sent- positive for hepc antibody She has no symptoms She does not know how she ws exposed She had  blood transfusion in 1959 and 2 c sections in 1969, 1974.and had hysterectomy in 1974    Past Medical History:  Diagnosis Date   Arthritis    Chronic kidney disease    stage 3   Hypertension    Hypothyroidism     Past Surgical History:  Procedure Laterality Date   ABDOMINAL HYSTERECTOMY     ABDOMINAL SURGERY     tacked up uterus   APPENDECTOMY     ARTERY BIOPSY N/A 05/19/2021   Procedure: BIOPSY TEMPORAL ARTERY;  Surgeon: CHerbert Pun MD;  Location: [facility] OR[facility];  Service: General;  Laterality: N/A;   CATARACT EXTRACTION W/PHACO Left 10/04/2016   Procedure: CATARACT EXTRACTION PHACO AND INTRAOCULAR LENS PLACEMENT (IGaines;  Surgeon: KEulogio Bear MD;  Location: [facility] OR[facility];  Service: Ophthalmology;  Laterality: Left;  us02:17.8 ap%19.6 cde27.31 fluid lot # [id number]   CESAREAN SECTION     EYE SURGERY     INGUINAL HERNIA REPAIR Right 12/08/2018   Procedure: OPEN RIGHT HERNIA REPAIR INGUINAL ADULT;  Surgeon: CHerbert Pun MD;  Location: [facility] OR[facility];  Service: General;  Laterality: Right;    Social History   Socioeconomic History   Marital status: Married    Spouse name: Not on file   Number of children: Not on file   Years of education:  Not on file   Highest education level: Not on file  Occupational History   Not on file  Tobacco Use   Smoking status: Every Day    Packs/day: 1.00    Types: Cigarettes   Smokeless tobacco: Never  Vaping Use   Vaping Use: Never used  Substance and Sexual Activity   Alcohol use: No   Drug use: No   Sexual activity: Not on file  Other Topics Concern   Not on file  Social History Narrative   Not on file   Social Determinants of Health   Financial Resource Strain: Not on file  Food Insecurity: Not on file  Transportation Needs: Not on file  Physical Activity: Not on file  Stress: Not on file  Social Connections: Not on file  Intimate Partner Violence: Not on file    No family history on file. Allergies  Allergen Reactions   Alendronate Other (See Comments)    Dyspepsia (indigestion)    Buspirone Other (See Comments)    sedation   I? Current Outpatient Medications  Medication Sig Dispense Refill   acetaminophen (TYLENOL) 325 MG tablet Take 650 mg by mouth every 6 (six) hours as needed.     amLODipine (NORVASC) 10 MG tablet Take 10 mg by mouth daily.     Calcium Carb-Cholecalciferol 600-800 MG-UNIT TABS Take 1 tablet by mouth daily.     Cholecalciferol (VITAMIN D3 PO)  Take 1 tablet by mouth daily.     Cyanocobalamin (VITAMIN B-12 PO) Take 1 tablet by mouth daily.     esomeprazole (NEXIUM) 20 MG capsule Take by mouth.     levothyroxine (SYNTHROID) 50 MCG tablet Take 50 mcg by mouth daily before breakfast.      predniSONE (DELTASONE) 20 MG tablet Take by mouth.     No current facility-administered medications for this visit.     Abtx:  Anti-infectives (From admission, onward)    None       REVIEW OF SYSTEMS:  Const: negative fever, negative chills, negative weight loss Eyes: negative diplopia , some blurred vision, negative eye pain ENT: negative coryza, negative sore throat Resp: negative cough, hemoptysis, dyspnea Cards: negative for chest pain,  palpitations, lower extremity edema GU: negative for frequency, dysuria and hematuria GI: Negative for abdominal pain, diarrhea, bleeding, constipation Skin: negative for rash and pruritus Heme: negative for easy bruising and gum/nose bleeding MS: negative for myalgias, arthralgias, back pain and muscle weakness Neurolo: headaches, have resolved with prednisone had dizziness, no  memory problems  Psych: negative for feelings of anxiety, depression  Endocrine:  thyroid,issue Allergy/Immunology- negative for any medication or food allergies ?  Objective:  VITALS:  BP 131/83    Pulse 79    Resp 16    Ht 5' 3.5" (1.613 m)    Wt 128 lb (58.1 kg)    SpO2 98%    BMI 22.32 kg/m  PHYSICAL EXAM:  General: Alert, cooperative, no distress, appears stated age.  Head: Normocephalic, without obvious abnormality, atraumatic. Eyes: Conjunctivae clear, anicteric sclerae. Pupils are equal ENT Nares normal. No drainage or sinus tenderness. Lips, mucosa, and tongue normal. No Thrush Neck: Supple, symmetrical, no adenopathy, thyroid: non tender no carotid bruit and no JVD. Back: No CVA tenderness. Lungs: Clear to auscultation bilaterally. No Wheezing or Rhonchi. No rales. Heart: Regular rate and rhythm, no murmur, rub or gallop. Abdomen: Soft, non-tender,not distended. Bowel sounds normal. No masses Extremities: atraumatic, no cyanosis. No edema. No clubbing Skin: No rashes or lesions. Or bruising Lymph: Cervical, supraclavicular normal. Neurologic: Grossly non-focal Pertinent Labs Lab Results HEpC antibody > 11-on 05/18/21  CMP Latest Ref Rng & Units 11/21/2018  Creatinine 0.44 - 1.00 mg/dL 1.50(H)   ? Impression/Recommendation ? ?Giant cell arteritis/temporal artery - on prednisone  Positive HEPC antibody- will get HEPC RNA- if that is negative then she will not need any treatment as it is not active infection- if HEPC RNA is positive then can discuss treatment  Hypothyroidism-on  synthroid ? ___________________________________________________ Discussed with patient in detail Will let her know of the results and plan Note:  This document was prepared using Dragon voice recognition software and may include unintentional dictation errors.

## 2021-06-05 LAB — HEPATITIS C GENOTYPE

## 2021-06-05 LAB — HCV RNA QUANT RFLX ULTRA OR GENOTYP
HCV RNA Qnt(log copy/mL): 5.769 log10 IU/mL
HepC Qn: 588000 IU/mL

## 2021-06-06 ENCOUNTER — Telehealth: Payer: Self-pay

## 2021-06-06 NOTE — Telephone Encounter (Signed)
Discussed Hep C  results with patient. She was very thankful that I was so informative and supportive to her. She is coming on 1/26 at noon for her follow up and will be prepared to take the next steps and discuss a treatment plan. I did advise her that more testing and imaging may be required as well.

## 2021-06-08 ENCOUNTER — Ambulatory Visit: Payer: PPO | Attending: Infectious Diseases | Admitting: Infectious Diseases

## 2021-06-08 ENCOUNTER — Telehealth: Payer: Self-pay

## 2021-06-08 ENCOUNTER — Other Ambulatory Visit
Admission: RE | Admit: 2021-06-08 | Discharge: 2021-06-08 | Disposition: A | Discharge status: Home / Self Care | Payer: PPO | Attending: Infectious Diseases | Admitting: Infectious Diseases

## 2021-06-08 ENCOUNTER — Other Ambulatory Visit: Payer: Self-pay

## 2021-06-08 DIAGNOSIS — B182 Chronic viral hepatitis C: Secondary | ICD-10-CM | POA: Insufficient documentation

## 2021-06-08 DIAGNOSIS — E039 Hypothyroidism, unspecified: Secondary | ICD-10-CM | POA: Diagnosis not present

## 2021-06-08 DIAGNOSIS — I129 Hypertensive chronic kidney disease with stage 1 through stage 4 chronic kidney disease, or unspecified chronic kidney disease: Secondary | ICD-10-CM | POA: Diagnosis not present

## 2021-06-08 DIAGNOSIS — N183 Chronic kidney disease, stage 3 unspecified: Secondary | ICD-10-CM | POA: Insufficient documentation

## 2021-06-08 DIAGNOSIS — Z79890 Hormone replacement therapy (postmenopausal): Secondary | ICD-10-CM | POA: Insufficient documentation

## 2021-06-08 DIAGNOSIS — M316 Other giant cell arteritis: Secondary | ICD-10-CM | POA: Diagnosis not present

## 2021-06-08 LAB — PROTIME-INR
INR: 0.9 (ref 0.8–1.2)
Prothrombin Time: 12.3 seconds (ref 11.4–15.2)

## 2021-06-08 LAB — HIV ANTIBODY (ROUTINE TESTING W REFLEX): HIV Screen 4th Generation wRfx: NONREACTIVE

## 2021-06-08 LAB — HEPATITIS B SURFACE ANTIGEN
Hepatitis B Surface Ag: NONREACTIVE
Hepatitis B Surface Ag: NONREACTIVE

## 2021-06-08 LAB — APTT: aPTT: 21 seconds — ABNORMAL LOW (ref 24–36)

## 2021-06-08 LAB — HEPATITIS A ANTIBODY, TOTAL: hep A Total Ab: REACTIVE — AB

## 2021-06-08 NOTE — Telephone Encounter (Signed)
Need to call and schedule [place] Electrograph[facility] at [facility]. Then advise patient of NPO rules and time/date and location of [place]> should check in at [facility]

## 2021-06-08 NOTE — Patient Instructions (Signed)
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

## 2021-06-08 NOTE — Progress Notes (Signed)
NAME: Amy Hensley  DOB: 07-20-1932  MRN: [id number]  Date/Time: 06/08/2021 11:51 AM    ?follow up visit to discuss labs Amy Hensley is a 88 y.o. 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 as she was having headache for 2 months and thought she had the flu, esr was high, started on prednisone and then had biopsy on 05/19/21 which confirmed GCA being followed by rheumatologist- she is on prednisone 71m As part of the work up hepatitis panel and quant gold sent- positive for hepc antibody She has no symptoms She does not know how she ws exposed She had  blood transfusion in 1959 and 2 c sections in 1969, 1974.and had hysterectomy in 1974   The HCV vl is > 500K and genotype 2a/2c -she is here to discuss these labs and decide on future management Past Medical History:  Diagnosis Date   Arthritis    Chronic kidney disease    stage 3   Hypertension    Hypothyroidism     Past Surgical History:  Procedure Laterality Date   ABDOMINAL HYSTERECTOMY     ABDOMINAL SURGERY     tacked up uterus   APPENDECTOMY     ARTERY BIOPSY N/A 05/19/2021   Procedure: BIOPSY TEMPORAL ARTERY;  Surgeon: CHerbert Pun MD;  Location: [facility] OR[facility];  Service: General;  Laterality: N/A;   CATARACT EXTRACTION W/PHACO Left 10/04/2016   Procedure: CATARACT EXTRACTION PHACO AND INTRAOCULAR LENS PLACEMENT (IOlathe;  Surgeon: KEulogio Bear MD;  Location: [facility] OR[facility];  Service: Ophthalmology;  Laterality: Left;  us02:17.8 ap%19.6 cde27.31 fluid lot # [id number]   CESAREAN SECTION     EYE SURGERY     INGUINAL HERNIA REPAIR Right 12/08/2018   Procedure: OPEN RIGHT HERNIA REPAIR INGUINAL ADULT;  Surgeon: CHerbert Pun MD;  Location: [facility] OR[facility];  Service: General;  Laterality: Right;    Social History   Socioeconomic History   Marital status:  Married    Spouse name: Not on file   Number of children: Not on file   Years of education: Not on file   Highest education level: Not on file  Occupational History   Not on file  Tobacco Use   Smoking status: Every Day    Packs/day: 1.00    Types: Cigarettes   Smokeless tobacco: Never  Vaping Use   Vaping Use: Never used  Substance and Sexual Activity   Alcohol use: No   Drug use: No   Sexual activity: Not on file  Other Topics Concern   Not on file  Social History Narrative   Not on file   Social Determinants of Health   Financial Resource Strain: Not on file  Food Insecurity: Not on file  Transportation Needs: Not on file  Physical Activity: Not on file  Stress: Not on file  Social Connections: Not on file  Intimate Partner Violence: Not on file    No family history on file. Allergies  Allergen Reactions   Alendronate Other (See Comments)    Dyspepsia (indigestion)    Buspirone Other (See Comments)    sedation   I? Current Outpatient Medications  Medication Sig Dispense Refill   acetaminophen (TYLENOL) 325 MG tablet Take 650 mg by mouth every 6 (six) hours as needed.     amLODipine (NORVASC) 10 MG tablet Take 10 mg by mouth daily.  Calcium Carb-Cholecalciferol 600-800 MG-UNIT TABS Take 1 tablet by mouth daily.     Cholecalciferol (VITAMIN D3 PO) Take 1 tablet by mouth daily.     Cyanocobalamin (VITAMIN B-12 PO) Take 1 tablet by mouth daily.     esomeprazole (NEXIUM) 20 MG capsule Take by mouth.     levothyroxine (SYNTHROID) 50 MCG tablet Take 50 mcg by mouth daily before breakfast.      predniSONE (DELTASONE) 20 MG tablet Take by mouth.     No current facility-administered medications for this visit.     Abtx:  Anti-infectives (From admission, onward)    None       REVIEW OF SYSTEMS:  Const: negative fever, negative chills, gained weight due to prednisone Eyes: negative diplopia ,blurred vision much better negative eye pain, head ache  resolved ENT: negative coryza, negative sore throat Resp: negative cough, hemoptysis, dyspnea Cards: negative for chest pain, palpitations, lower extremity edema GU: negative for frequency, dysuria and hematuria GI: Negative for abdominal pain, diarrhea, bleeding, constipation Skin: negative for rash and pruritus Heme: negative for easy bruising and gum/nose bleeding MS: negative for myalgias, arthralgias, back pain and muscle weakness Neurolo: headaches, have resolved with prednisone had dizziness, no  memory problems  Psych: negative for feelings of anxiety, depression  Endocrine:  thyroid,issue Allergy/Immunology- negative for any medication or food allergies ?  Objective:  VITALS:  There were no vitals taken for this visit.  PHYSICAL EXAM:  General: Alert, cooperative, no distress, appears stated age.  Lungs: Clear to auscultation bilaterally. No Wheezing or Rhonchi. No rales. Heart: Regular rate and rhythm, no murmur, rub or gallop. Abdomen: Soft, non-tender,not distended. Bowel sounds normal. No masses Extremities: atraumatic, no cyanosis. No edema. No clubbing Skin: No rashes or lesions. Or bruising Lymph: Cervical, supraclavicular normal. Neurologic: Grossly non-focal Pertinent Labs Lab Results   Tests result DATE comment  HEPC RNA 588,000 06/01/21   HEPC  Genotype 2a/2c 06/01/21   Hepatitis B profile     Hepatitis A status     PT/PTT     AST, ALT, Bilirubin 28/27/0.6 06/01/21   Albumin 3.8 06/01/21   creatinine 1.5    HB/Platelet     HIV     AFP     TSH     comorbidities      tobacco     alcohol     drug     APRI Score     FIB 4 score     Current meds     Ultrasound Elastography                 ? Impression/Recommendation ? ?Giant cell arteritis/temporal artery - on prednisone  HEPC RNA positive at 588,000 copies and Genotype 2 c- pt is not keen on treatment but has agreed to do further workup to assess the status of the liver and then decide  Cr  1.5  Hypothyroidism-on synthroid ? ___________________________________________________ Discussed with patient in detail Follow up 2 weeks Note:  This document was prepared using Dragon voice recognition software and may include unintentional dictation errors.

## 2021-06-09 LAB — AFP TUMOR MARKER: AFP, Serum, Tumor Marker: <1.8 ng/mL (ref 0.0–8.7)

## 2021-06-10 LAB — HCV FIBROSURE
ALPHA 2-MACROGLOBULINS, QN: 308 mg/dL — ABNORMAL HIGH (ref 110–276)
ALT (SGPT) P5P: 24 IU/L (ref 0–40)
Apolipoprotein A-1: 234 mg/dL — ABNORMAL HIGH (ref 114–214)
Bilirubin, Total: 0.5 mg/dL (ref 0.0–1.2)
Fibrosis Score: 0.22 — ABNORMAL HIGH (ref 0.00–0.21)
GGT: 24 IU/L (ref 0–60)
Haptoglobin: 263 mg/dL (ref 41–333)
Necroinflammat Activity Score: 0.09 (ref 0.00–0.17)

## 2021-06-15 ENCOUNTER — Other Ambulatory Visit (HOSPITAL_COMMUNITY): Payer: Self-pay

## 2021-06-16 ENCOUNTER — Other Ambulatory Visit: Payer: Self-pay | Admitting: Pharmacist

## 2021-06-16 ENCOUNTER — Other Ambulatory Visit (HOSPITAL_COMMUNITY): Payer: Self-pay

## 2021-06-16 DIAGNOSIS — B182 Chronic viral hepatitis C: Secondary | ICD-10-CM

## 2021-06-16 MED ORDER — SOFOSBUVIR-VELPATASVIR 400-100 MG PO TABS
1.0000 | ORAL_TABLET | Freq: Every day | ORAL | 2 refills | Status: DC
  Filled 2021-06-16 (×2): qty 28, 28d supply, fill #0
  Filled 2021-07-07 – 2021-07-17 (×2): qty 28, 28d supply, fill #1
  Filled 2021-08-01: qty 28, 28d supply, fill #2

## 2021-06-17 ENCOUNTER — Other Ambulatory Visit (HOSPITAL_COMMUNITY): Payer: Self-pay

## 2021-06-19 ENCOUNTER — Other Ambulatory Visit (HOSPITAL_COMMUNITY): Payer: Self-pay

## 2021-06-19 ENCOUNTER — Ambulatory Visit
Admission: RE | Admit: 2021-06-19 | Discharge: 2021-06-19 | Disposition: A | Discharge status: Home / Self Care | Payer: PPO | Source: Ambulatory Visit | Attending: Infectious Diseases | Admitting: Infectious Diseases

## 2021-06-19 ENCOUNTER — Other Ambulatory Visit: Payer: Self-pay

## 2021-06-19 ENCOUNTER — Telehealth: Payer: Self-pay | Admitting: Pharmacist

## 2021-06-19 DIAGNOSIS — B1920 Unspecified viral hepatitis C without hepatic coma: Secondary | ICD-10-CM | POA: Diagnosis not present

## 2021-06-19 DIAGNOSIS — B182 Chronic viral hepatitis C: Secondary | ICD-10-CM | POA: Insufficient documentation

## 2021-06-19 IMAGING — US US LIVER ELASTOGRAPHY
1 series · 12 of 25 positions shown · non-contrast
Comparison: None

CLINICAL DATA: Hepatitis C

EXAM:
US LIVER ELASTOGRAPHY
TECHNIQUE: Sonography of the liver was performed. In addition, ultrasound
elastography evaluation of the liver was performed. A region of
interest was placed within the right lobe of the liver. Following
application of a compressive sonographic pulse, tissue
compressibility was assessed. Multiple assessments were performed at
the selected site. Median tissue compressibility was determined.
Previously, hepatic stiffness was assessed by shear wave velocity.
Based on recently published Society of Radiologists in Ultrasound
consensus article, reporting is now recommended to be performed in
the SI units of pressure (kiloPascals) representing hepatic
stiffness/elasticity. The obtained result is compared to the
published reference standards. (cACLD = compensated Advanced Chronic
Liver Disease)

[Series 1: us elastography liver · 12 of 42 slices shown]
[im 2/42]
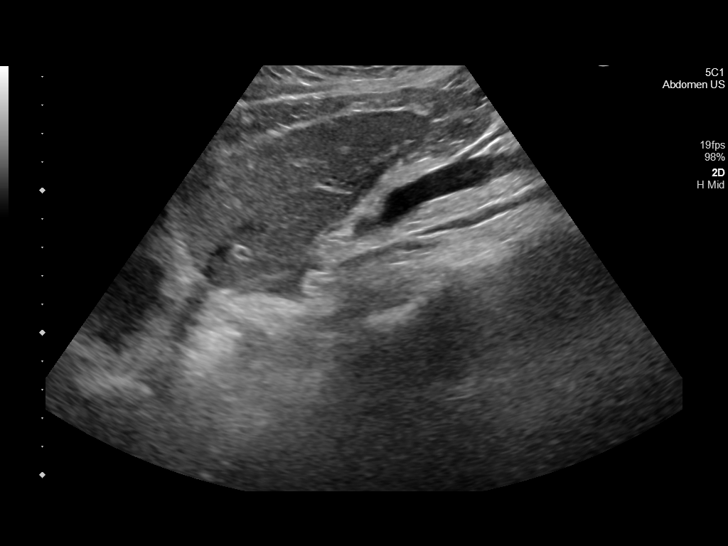
[im 6/42]
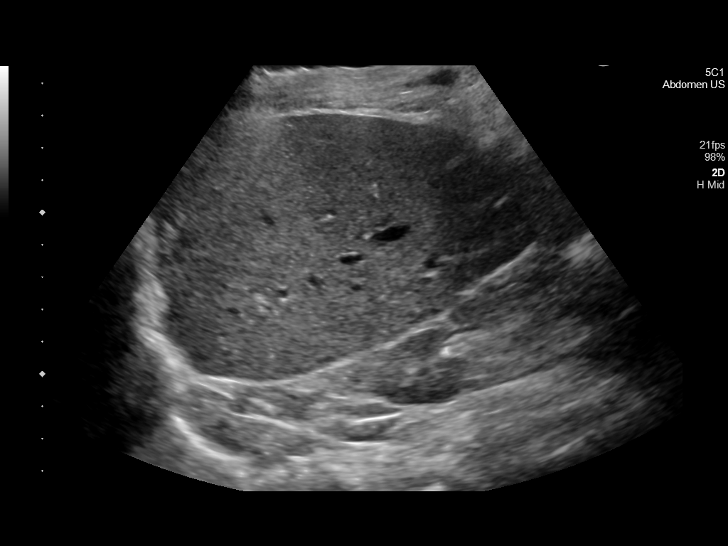
[im 9/42]
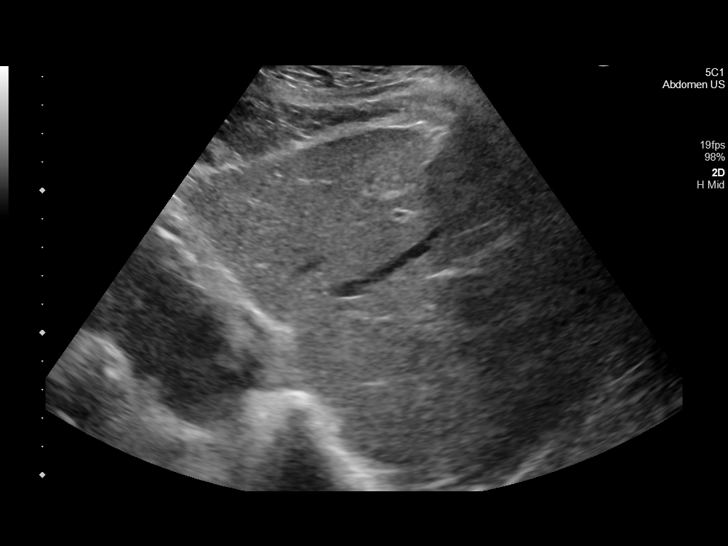
[im 12/42]
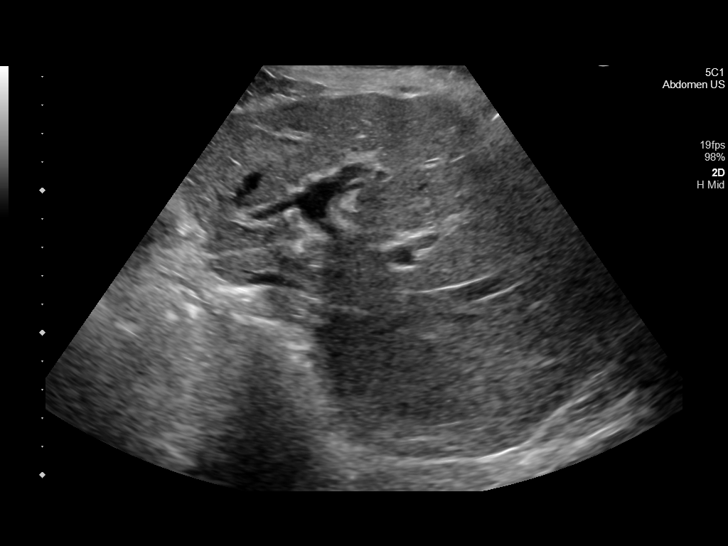
[im 16/42]
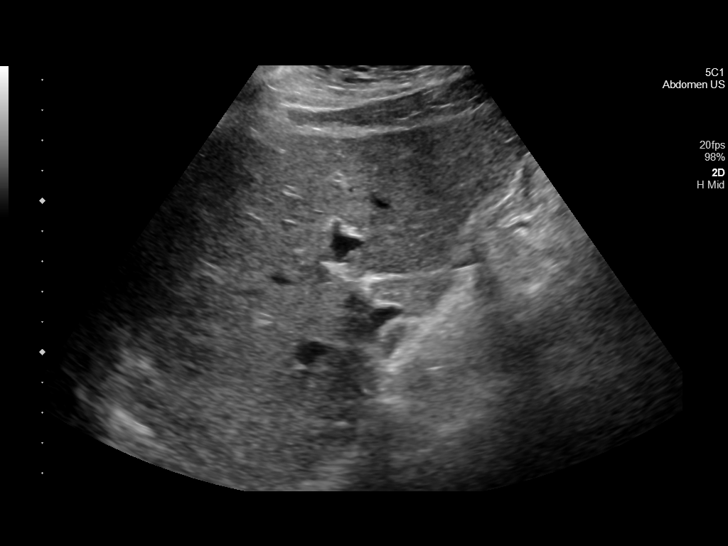
[im 19/42]
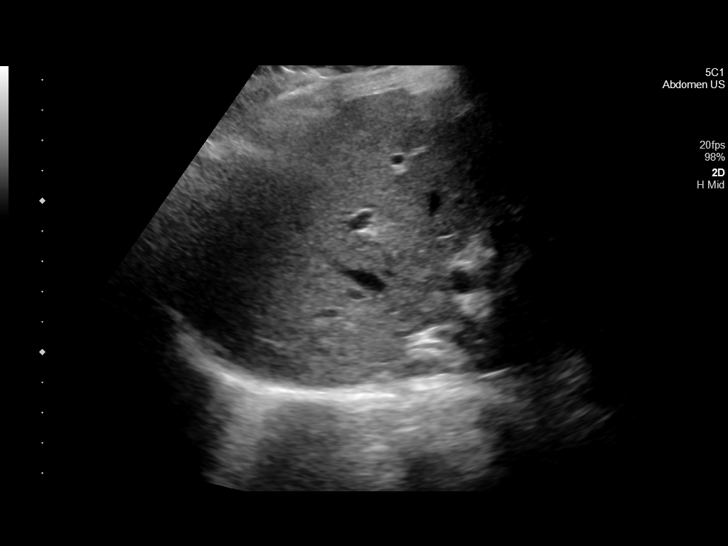
[im 23/42]
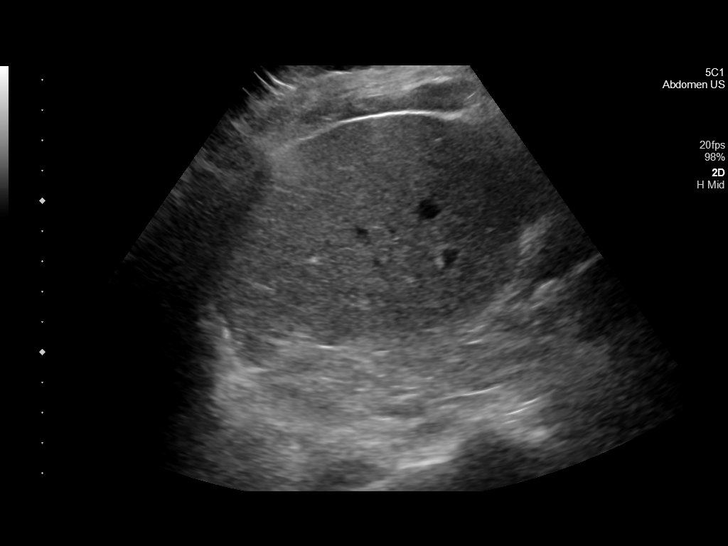
[im 26/42]
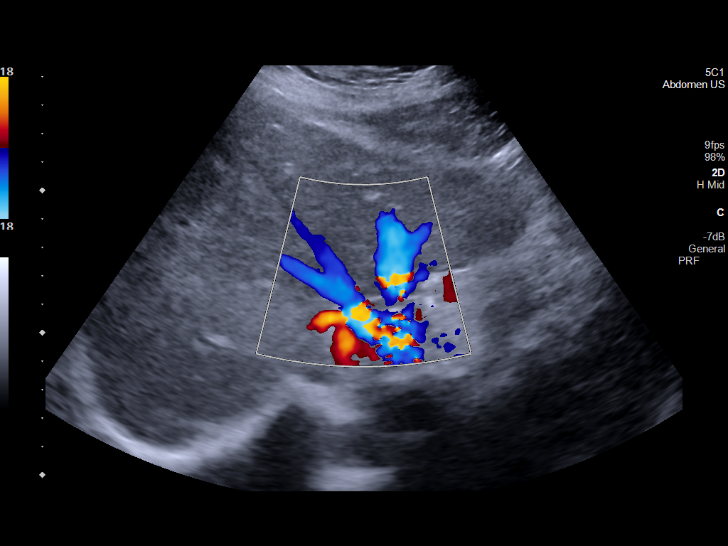
[im 30/42]
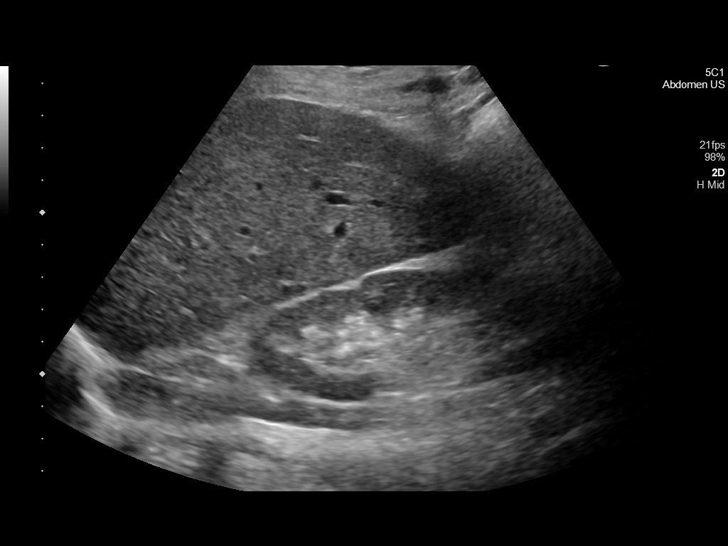
[im 33/42]
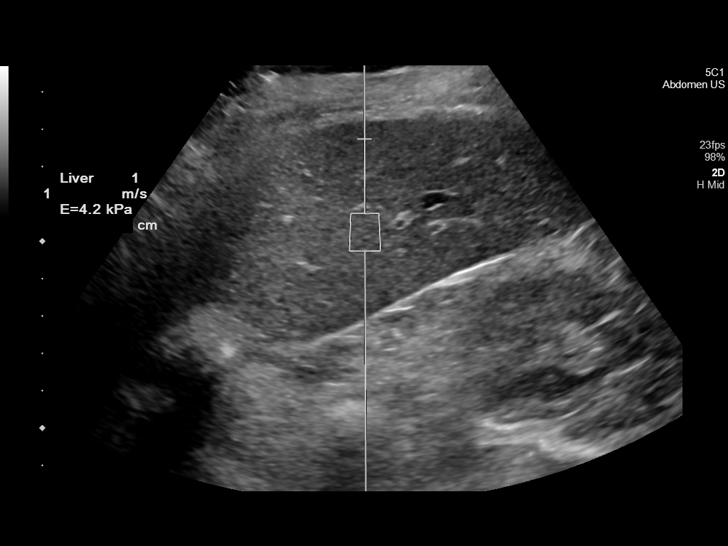
[im 36/42]
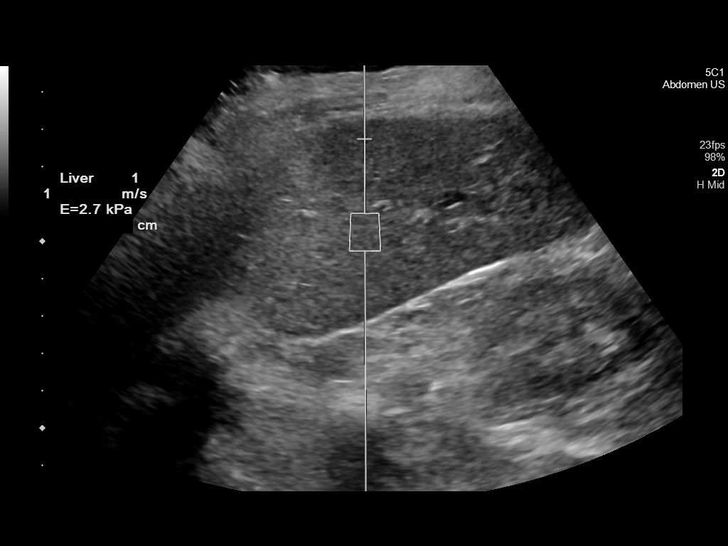
[im 40/42]
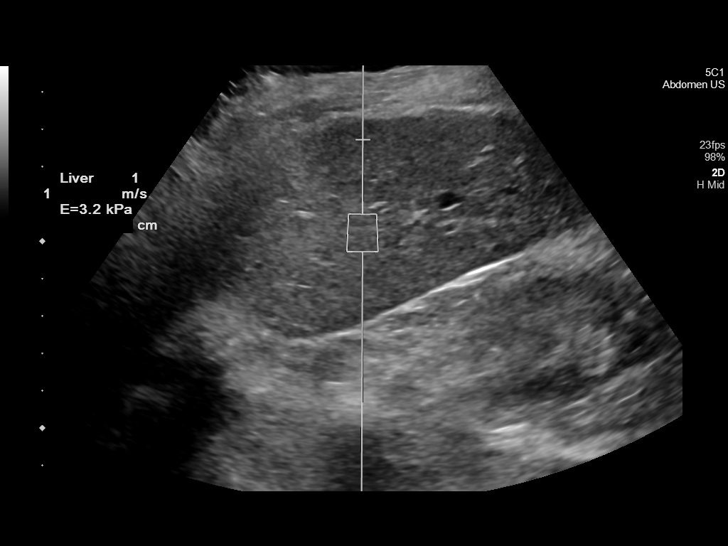

[12 of 25 positions shown; findings below may reference images not displayed]

FINDINGS: Liver: Heterogeneous hepatic echogenicity without discrete mass or
nodularity. No intrahepatic biliary dilatation. Portal vein is
patent on color Doppler imaging with normal direction of blood flow
towards the liver.

ULTRASOUND HEPATIC ELASTOGRAPHY

Device: Siemens Helix VTQ

Patient position: Supine

Transducer: [id number]

Number of measurements: 10

Hepatic segment:  8

Median kPa:

IQR:

IQR/Median kPa ratio:

Data quality:  Good

Diagnostic category:  < or = 5 kPa: high probability of being normal

The use of hepatic elastography is applicable to patients with viral
hepatitis and non-alcoholic fatty liver disease. At this time, there
is insufficient data for the referenced cut-off values and use in
other causes of liver disease, including alcoholic liver disease.
Patients, however, may be assessed by elastography and serve as
their own reference standard/baseline.

In patients with non-alcoholic liver disease, the values suggesting
compensated advanced chronic liver disease (cACLD) may be lower, and
patients may need additional testing with elasticity results of [DATE]
kPa.

Please note that abnormal hepatic elasticity and shear wave
velocities may also be identified in clinical settings other than
with hepatic fibrosis, such as: acute hepatitis, elevated right
heart and central venous pressures including use of beta blockers,
IBRO LAKATOS disease (IBRO LAKATOS), infiltrative processes such as
mastocytosis/amyloidosis/infiltrative tumor/lymphoma, extrahepatic
cholestasis, with hyperemia in the post-prandial state, and with
liver transplantation. Correlation with patient history, laboratory
data, and clinical condition recommended.

Diagnostic Categories:

< or =5 kPa: high probability of being normal

< or =9 kPa: in the absence of other known clinical signs, rules [DATE] kPa and ?13 kPa: suggestive of cACLD, but needs further testing

>13 kPa: highly suggestive of cACLD

> or =17 kPa: highly suggestive of cACLD with an increased
probability of clinically significant portal hypertension
IMPRESSION: ULTRASOUND LIVER:

Heterogeneous parenchymal echogenicity of liver without focal mass
or nodularity.

ULTRASOUND HEPATIC ELASTOGRAPHY:

Median kPa:

Diagnostic category:  < or = 5 kPa: high probability of being normal

## 2021-06-19 NOTE — Telephone Encounter (Signed)
Patient is approved to receive Epclusa x 12 weeks for chronic Hepatitis C infection. Counseled patient to take Epclusa daily with or without food. Encouraged patient not to miss any doses and explained how their chance of cure could go down with each dose missed. Counseled patient on what to do if dose is missed - if it is closer to the missed dose take immediately; if closer to next dose skip dose and take the next dose at the usual time.   Counseled patient on common side effects such as headache, fatigue, and nausea and that these normally decrease with time. I reviewed patient medications and found a drug interaction with her Nexium. Patient is taking Nexium because she is also on Prednisone. States that she doesn't necessarily need it and will talk with her doctor on Wednesday regarding the use. She will wait until she sees Dr. Delaine Lame on Thursday 2/9 to start it.  If she still needs the Nexium, advised her to either take the Nexium and Epclusa together with food or take Epclusa with food and wait 4 hours before taking Nexium. Discussed that any agent for acid reflux will have a drug interaction. She is also taking Tylenol PM. Advised it is best to avoid Tylenol with active Hepatitis C to prevent further liver damage. She will discuss with doctor as well. Reviewed the rest of her medications and found no other issues. Instructed patient to call clinic if she wishes to start a new medication during course of therapy. Also advised patient to call if she experiences any side effects. Patient will follow-up with [facility] ID clinic.  Macario Shear L. Eber Hong, PharmD, BCIDP, AAHIVP, CPP Clinical Pharmacist Practitioner Infectious Diseases [facility] 06/19/2021, 11:55 AM

## 2021-06-19 NOTE — Telephone Encounter (Signed)
Called to counsel patient on Epclusa. No answer, left HIPAA compliant VM to call me back.  Nelly Scriven L. Shamirah Ivan, PharmD [facility] Clinical Pharmacist Practitioner

## 2021-06-21 DIAGNOSIS — M316 Other giant cell arteritis: Secondary | ICD-10-CM | POA: Diagnosis not present

## 2021-06-21 DIAGNOSIS — Z7952 Long term (current) use of systemic steroids: Secondary | ICD-10-CM | POA: Diagnosis not present

## 2021-06-21 DIAGNOSIS — B182 Chronic viral hepatitis C: Secondary | ICD-10-CM | POA: Diagnosis not present

## 2021-06-22 ENCOUNTER — Encounter: Payer: Self-pay | Admitting: Infectious Diseases

## 2021-06-22 ENCOUNTER — Ambulatory Visit: Payer: PPO | Attending: Infectious Diseases | Admitting: Infectious Diseases

## 2021-06-22 ENCOUNTER — Other Ambulatory Visit: Payer: Self-pay

## 2021-06-22 VITALS — BP 134/84 | HR 92 | Wt 132.0 lb

## 2021-06-22 DIAGNOSIS — E039 Hypothyroidism, unspecified: Secondary | ICD-10-CM | POA: Diagnosis not present

## 2021-06-22 DIAGNOSIS — M316 Other giant cell arteritis: Secondary | ICD-10-CM | POA: Diagnosis not present

## 2021-06-22 DIAGNOSIS — Z79899 Other long term (current) drug therapy: Secondary | ICD-10-CM | POA: Diagnosis not present

## 2021-06-22 DIAGNOSIS — I129 Hypertensive chronic kidney disease with stage 1 through stage 4 chronic kidney disease, or unspecified chronic kidney disease: Secondary | ICD-10-CM | POA: Insufficient documentation

## 2021-06-22 DIAGNOSIS — N183 Chronic kidney disease, stage 3 unspecified: Secondary | ICD-10-CM | POA: Diagnosis not present

## 2021-06-22 DIAGNOSIS — Z7952 Long term (current) use of systemic steroids: Secondary | ICD-10-CM | POA: Insufficient documentation

## 2021-06-22 DIAGNOSIS — B182 Chronic viral hepatitis C: Secondary | ICD-10-CM | POA: Diagnosis not present

## 2021-06-22 NOTE — Patient Instructions (Signed)
You are here to start treatmen tfor HEPC- you got the medicine epclusa which you will take 1 a day for 12 weeks- will see you in 6 weeks

## 2021-06-22 NOTE — Progress Notes (Signed)
NAME: Amy Hensley  DOB: 07-13-1932  MRN: [id number]  Date/Time: 06/22/2021 11:24 AM  Patient is here to discuss treatment for hepatitis C.  ? Amy Hensley is a 88 y.o. 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 as she was having headache for 2 months and thought she had the flu, esr was high, started on prednisone and then had biopsy on 05/19/21 which confirmed GCA being followed by rheumatologist- she is on prednisone 64m As part of the work up hepatitis panel and quant gold sent- positive for hepc antibody She has no symptoms She does not know how she was exposed She had  blood transfusion in 1959 and 2 c sections in 1969, 1974.and had hysterectomy in 1974   The HCV vl is 588 K and genotype 2a/2c -  Past Medical History:  Diagnosis Date   Arthritis    Chronic kidney disease    stage 3   Hypertension    Hypothyroidism     Past Surgical History:  Procedure Laterality Date   ABDOMINAL HYSTERECTOMY     ABDOMINAL SURGERY     tacked up uterus   APPENDECTOMY     ARTERY BIOPSY N/A 05/19/2021   Procedure: BIOPSY TEMPORAL ARTERY;  Surgeon: CHerbert Pun MD;  Location: [facility] ORS;  Service: General;  Laterality: N/A;   CATARACT EXTRACTION W/PHACO Left 10/04/2016   Procedure: CATARACT EXTRACTION PHACO AND INTRAOCULAR LENS PLACEMENT ([place];  Surgeon: KEulogio Bear MD;  Location: [facility];  Service: Ophthalmology;  Laterality: Left;  us02:17.8 ap%19.6 cde27.31 fluid lot # [id number]   CESAREAN SECTION     EYE SURGERY     INGUINAL HERNIA REPAIR Right 12/08/2018   Procedure: OPEN RIGHT HERNIA REPAIR INGUINAL ADULT;  Surgeon: CHerbert Pun MD;  Location: [facility] ORS;  Service: General;  Laterality: Right;    Social History   Socioeconomic History   Marital status: Married    Spouse name: Not on file    Number of children: Not on file   Years of education: Not on file   Highest education level: Not on file  Occupational History   Not on file  Tobacco Use   Smoking status: Former    Packs/day: 1.00    Types: Cigarettes   Smokeless tobacco: Never   Tobacco comments:    Quit 2022  Vaping Use   Vaping Use: Never used  Substance and Sexual Activity   Alcohol use: No   Drug use: No   Sexual activity: Not on file  Other Topics Concern   Not on file  Social History Narrative   Not on file   Social Determinants of Health   Financial Resource Strain: Not on file  Food Insecurity: Not on file  Transportation Needs: Not on file  Physical Activity: Not on file  Stress: Not on file  Social Connections: Not on file  Intimate Partner Violence: Not on file    No family history on file. Allergies  Allergen Reactions   Alendronate Other (See Comments)    Dyspepsia (indigestion)    Buspirone Other (See Comments)    sedation   I? Current Outpatient Medications  Medication Sig Dispense Refill   acetaminophen (TYLENOL) 325 MG tablet Take 650 mg by mouth every 6 (six) hours as needed.     amLODipine (NORVASC) 10 MG tablet Take 10 mg by mouth daily.  Calcium Carb-Cholecalciferol 600-800 MG-UNIT TABS Take 1 tablet by mouth daily.     Cholecalciferol (VITAMIN D3 PO) Take 1 tablet by mouth daily.     Cyanocobalamin (VITAMIN B-12 PO) Take 1 tablet by mouth daily.     esomeprazole (NEXIUM) 20 MG capsule Take by mouth.     levothyroxine (SYNTHROID) 50 MCG tablet Take 50 mcg by mouth daily before breakfast.      predniSONE (DELTASONE) 20 MG tablet Take by mouth.     Sofosbuvir-Velpatasvir (EPCLUSA) 400-100 MG TABS Take 1 tablet by mouth daily. 28 tablet 2   No current facility-administered medications for this visit.     Abtx:  Anti-infectives (From admission, onward)    None       REVIEW OF SYSTEMS:  Const: negative fever, negative chills, gained weight due to prednisone Eyes:  negative diplopia ,blurred vision much better negative eye pain, head ache resolved ENT: negative coryza, negative sore throat Resp: negative cough, hemoptysis, dyspnea Cards: negative for chest pain, palpitations, lower extremity edema GU: negative for frequency, dysuria and hematuria GI: Negative for abdominal pain, diarrhea, bleeding, constipation Skin: negative for rash and pruritus Heme: negative for easy bruising and gum/nose bleeding MS: negative for myalgias, arthralgias, back pain and muscle weakness Neurolo: headaches, have resolved with prednisone had dizziness, no  memory problems  Psych: negative for feelings of anxiety, depression  Endocrine:  thyroid,issue Allergy/Immunology- negative for any medication or food allergies ?  Objective:  VITALS:  BP 134/84    Pulse 92    Wt 132 lb (59.9 kg)    SpO2 97%    BMI 23.02 kg/m   PHYSICAL EXAM:  General: Alert, cooperative, no distress, appears stated age.  Lungs: Clear to auscultation bilaterally. No Wheezing or Rhonchi. No rales. Heart: Regular rate and rhythm, no murmur, rub or gallop. Abdomen: Soft, non-tender,not distended. Bowel sounds normal. No masses Extremities: atraumatic, no cyanosis. No edema. No clubbing Skin: No rashes or lesions. Or bruising Lymph: Cervical, supraclavicular normal. Neurologic: Grossly non-focal Pertinent Labs Lab Results   Tests result DATE comment  HEPC RNA 588,000 06/01/21   HEPC  Genotype 2a/2c 06/01/21   Hepatitis B profile Sag neg, s ab neg    Hepatitis A status reactive    PT/PTT 12.3/21 06/08/21   AST, ALT, Bilirubin 28/27/0.6 06/01/21   Albumin 3.8 06/01/21   creatinine 1.5 05/09/21   HB/Platelet 12/563 05/09/21   HIV NR 06/08/21   AFP <1.8    TSH     comorbidities  GCA    tobacco N    alcohol N    drug N         fibrosure 0.22 06/08/21 No fibrosis  Current meds     Ultrasound Elastography Median kPa 3.9 06/19/21 normal              ? Impression/Recommendation ? ?Giant  cell arteritis/temporal artery - on prednisone  HEPC RNA positive at 588,000 copies and Genotype 2 c- ultrasound elastogrphy and fibrosure normal with no evidece of fibrosis or cirrhosis Pt will start treatment for 12 weeks with Epclusa ( sof0sbuvir ad velpatasvir She will take nexium first thing in the morning and she will take the antiviral after lunch  Hypothyroidism-on synthroid ? ___________________________________________________ Discussed with patient in detail Pharmacist spoke to her as well and explained the side effects of the medication Follow up in 8 weeks Note:  This document was prepared using Dragon voice recognition software and may include unintentional dictation errors.

## 2021-07-06 ENCOUNTER — Other Ambulatory Visit (HOSPITAL_COMMUNITY): Payer: Self-pay

## 2021-07-07 ENCOUNTER — Other Ambulatory Visit (HOSPITAL_COMMUNITY): Payer: Self-pay

## 2021-07-13 ENCOUNTER — Telehealth: Payer: Self-pay

## 2021-07-13 ENCOUNTER — Other Ambulatory Visit (HOSPITAL_COMMUNITY): Payer: Self-pay

## 2021-07-13 NOTE — Telephone Encounter (Signed)
[facility] Patient Advocate Encounter ?  ?Received notification from [place] that prior authorization for [name]) is required. ?  ?PA submitted on 07/13/21 ?Key [id number] ?Status is pending ?   ?[facility] will continue to follow. ? ? ?Ileene Patrick, CPhT ?Specialty Pharmacy Patient Advocate ?[place] [facility] ?Phone: [phone] ?Fax:  [phone]  ?

## 2021-07-17 ENCOUNTER — Other Ambulatory Visit (HOSPITAL_COMMUNITY): Payer: Self-pay

## 2021-07-17 ENCOUNTER — Telehealth: Payer: Self-pay

## 2021-07-17 NOTE — Telephone Encounter (Signed)
[facility] Patient Advocate Encounter ? ?Prior Authorization for Raeanne Gathers has been approved.   ? ? ?Effective dates: 07/14/21 through 01/14/22 ? ?Patients co-pay is $1116.00.  ? ?Medication was filled at [facility] and is being shipped today. (07/17/21) ? ?[facility] will continue to follow. ? ?Ileene Patrick, CPhT ?Specialty Pharmacy Patient Advocate ?[place] [facility] ?Phone: [phone] ?Fax:  [phone]  ?

## 2021-08-01 ENCOUNTER — Other Ambulatory Visit (HOSPITAL_COMMUNITY): Payer: Self-pay

## 2021-08-03 DIAGNOSIS — M810 Age-related osteoporosis without current pathological fracture: Secondary | ICD-10-CM | POA: Diagnosis not present

## 2021-08-03 DIAGNOSIS — M316 Other giant cell arteritis: Secondary | ICD-10-CM | POA: Diagnosis not present

## 2021-08-03 DIAGNOSIS — Z7952 Long term (current) use of systemic steroids: Secondary | ICD-10-CM | POA: Diagnosis not present

## 2021-08-08 ENCOUNTER — Other Ambulatory Visit (HOSPITAL_COMMUNITY): Payer: Self-pay

## 2021-08-09 ENCOUNTER — Other Ambulatory Visit (HOSPITAL_COMMUNITY): Payer: Self-pay

## 2021-08-10 ENCOUNTER — Other Ambulatory Visit
Admission: RE | Admit: 2021-08-10 | Discharge: 2021-08-10 | Disposition: A | Discharge status: Home / Self Care | Payer: PPO | Attending: Infectious Diseases | Admitting: Infectious Diseases

## 2021-08-10 ENCOUNTER — Encounter: Payer: Self-pay | Admitting: Infectious Diseases

## 2021-08-10 ENCOUNTER — Ambulatory Visit: Payer: PPO | Attending: Infectious Diseases | Admitting: Infectious Diseases

## 2021-08-10 VITALS — BP 154/86 | HR 85 | Temp 97.8°F

## 2021-08-10 DIAGNOSIS — M316 Other giant cell arteritis: Secondary | ICD-10-CM | POA: Insufficient documentation

## 2021-08-10 DIAGNOSIS — B182 Chronic viral hepatitis C: Secondary | ICD-10-CM | POA: Insufficient documentation

## 2021-08-10 DIAGNOSIS — M5440 Lumbago with sciatica, unspecified side: Secondary | ICD-10-CM | POA: Diagnosis not present

## 2021-08-10 DIAGNOSIS — E039 Hypothyroidism, unspecified: Secondary | ICD-10-CM | POA: Diagnosis not present

## 2021-08-10 DIAGNOSIS — Z79890 Hormone replacement therapy (postmenopausal): Secondary | ICD-10-CM | POA: Diagnosis not present

## 2021-08-10 DIAGNOSIS — M5441 Lumbago with sciatica, right side: Secondary | ICD-10-CM | POA: Insufficient documentation

## 2021-08-10 DIAGNOSIS — I10 Essential (primary) hypertension: Secondary | ICD-10-CM | POA: Diagnosis not present

## 2021-08-10 DIAGNOSIS — Z7952 Long term (current) use of systemic steroids: Secondary | ICD-10-CM | POA: Insufficient documentation

## 2021-08-10 LAB — HEPATIC FUNCTION PANEL
ALT: 20 U/L (ref 0–44)
AST: 22 U/L (ref 15–41)
Albumin: 3.6 g/dL (ref 3.5–5.0)
Alkaline Phosphatase: 81 U/L (ref 38–126)
Bilirubin, Direct: <0.1 mg/dL (ref 0.0–0.2)
Total Bilirubin: 0.7 mg/dL (ref 0.3–1.2)
Total Protein: 7.9 g/dL (ref 6.5–8.1)

## 2021-08-10 LAB — CBC WITH DIFFERENTIAL/PLATELET
Abs Immature Granulocytes: 1.1 10*3/uL — ABNORMAL HIGH (ref 0.00–0.07)
Basophils Absolute: 0.1 10*3/uL (ref 0.0–0.1)
Basophils Relative: 1 %
Eosinophils Absolute: 0 10*3/uL (ref 0.0–0.5)
Eosinophils Relative: 0 %
HCT: 41.4 % (ref 36.0–46.0)
Hemoglobin: 13.5 g/dL (ref 12.0–15.0)
Immature Granulocytes: 7 %
Lymphocytes Relative: 12 %
Lymphs Abs: 1.9 10*3/uL (ref 0.7–4.0)
MCH: 29.3 pg (ref 26.0–34.0)
MCHC: 32.6 g/dL (ref 30.0–36.0)
MCV: 90 fL (ref 80.0–100.0)
Monocytes Absolute: 1 10*3/uL (ref 0.1–1.0)
Monocytes Relative: 6 %
Neutro Abs: 12.1 10*3/uL — ABNORMAL HIGH (ref 1.7–7.7)
Neutrophils Relative %: 74 %
Platelets: 341 10*3/uL (ref 150–400)
RBC: 4.6 MIL/uL (ref 3.87–5.11)
RDW: 16.9 % — ABNORMAL HIGH (ref 11.5–15.5)
Smear Review: NORMAL
WBC: 16.3 10*3/uL — ABNORMAL HIGH (ref 4.0–10.5)
nRBC: 0 % (ref 0.0–0.2)

## 2021-08-10 NOTE — Progress Notes (Signed)
NAME: Amy Hensley  ?DOB: 1932/08/30  ?MRN: [id number]  ?Date/Time: 08/10/2021 11:30 AM ? ?Amy Hensley is a 88 y.o. female with a history ofHTN, hypothyroidism, recently diagnosed temporal arteritis ?Pt here for follow up since she started HEPC treatment ?No nausea or vomitng or GI symptoms since starting Epclusa 8 weeks ago. 3 days ago she got out of bed and noted a pain rt side of the back radiating to the back of the leg- has had trouble walking- using cane. 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  ?The HCV vl was 588 K and genotype 2a/2c -she started treatment 06/19/21 ? ?Past Medical History:  ?Diagnosis Date  ? Arthritis   ? Chronic kidney disease   ? stage 3  ? Hypertension   ? Hypothyroidism   ?  ?Past Surgical History:  ?Procedure Laterality Date  ? ABDOMINAL HYSTERECTOMY    ? ABDOMINAL SURGERY    ? tacked up uterus  ? APPENDECTOMY    ? ARTERY BIOPSY N/A 05/19/2021  ? Procedure: BIOPSY TEMPORAL ARTERY;  Surgeon: CHerbert Pun MD;  Location: [facility];  Service: General;  Laterality: N/A;  ? CATARACT EXTRACTION W/PHACO Left 10/04/2016  ? Procedure: CATARACT EXTRACTION PHACO AND INTRAOCULAR LENS PLACEMENT ([facility]);  Surgeon: KEulogio Bear MD;  Location:  [facility] OR[facility];  Service: Ophthalmology;  Laterality: Left;  us02:17.8 ?ap%19.6 ?cde27.31 ?fluid lot # [id number]  ? CESAREAN SECTION    ? EYE SURGERY    ? INGUINAL HERNIA REPAIR Right 12/08/2018  ? Procedure: OPEN RIGHT HERNIA REPAIR INGUINAL ADULT;  Surgeon: CHerbert Pun MD;  Location: [facility] ORS;  Service: General;  Laterality: Right;  ?  ?Social History  ? ?Socioeconomic History  ? Marital status: Married  ?  Spouse name: Not on file  ? Number of children: Not on file  ? Years of education: Not on file  ? Highest education level: Not on file  ?Occupational History  ? Not on file  ?Tobacco Use  ? Smoking status: Former  ?  Packs/day: 1.00  ?  Types: Cigarettes  ? Smokeless tobacco: Never  ? Tobacco comments:  ?  Quit 2022  ?Vaping Use  ? Vaping Use: Never used  ?Substance and Sexual Activity  ? Alcohol use: No  ? Drug use: No  ? Sexual activity: Not on file  ?Other Topics Concern  ? Not on file  ?Social History Narrative  ? Not on file  ? ?Social Determinants of Health  ? ?Financial Resource Strain: Not on file  ?Food Insecurity: Not on file  ?Transportation Needs: Not on file  ?Physical Activity: Not on file  ?Stress: Not on file  ?Social Connections: Not on file  ?Intimate Partner Violence: Not on file  ?  ?No  family history on file. ?Allergies  ?Allergen Reactions  ? Alendronate Other (See Comments)  ?  Dyspepsia (indigestion)   ? Buspirone Other (See Comments)  ?  sedation  ? ?I? ?Current Outpatient Medications  ?Medication Sig Dispense Refill  ? acetaminophen (TYLENOL) 325 MG tablet Take 650 mg by mouth every 6 (six) hours as needed.    ? amLODipine (NORVASC) 10 MG tablet Take 10 mg by mouth daily.    ? Calcium Carb-Cholecalciferol 600-800 MG-UNIT TABS Take 1 tablet by mouth daily.    ? Cholecalciferol (VITAMIN D3 PO) Take 1 tablet by mouth daily.    ? Cyanocobalamin (VITAMIN B-12 PO) Take 1 tablet by mouth daily.    ? esomeprazole (NEXIUM) 20 MG capsule Take by mouth.    ? levothyroxine (SYNTHROID) 50 MCG  tablet Take 50 mcg by mouth daily before breakfast.     ? predniSONE (DELTASONE) 20 MG tablet Take 20 mg by mouth.    ? Sofosbuvir-Velpatasvir (EPCLUSA) 400-100 MG TABS Take 1 tablet by mouth daily. 28 tablet 2  ? ?No current facility-administered medications for this visit.  ?  ? ?Abtx:  ?Anti-infectives (From admission, onward)  ? ? None  ? ?  ? ? ?REVIEW OF SYSTEMS:  ?Const: negative fever, negative chills, gained weight due to prednisone ?Eyes: negative diplopia ,blurred vision much better negative eye pain, head ache resolved ?ENT: negative coryza, negative sore throat ?Resp: negative cough, hemoptysis, dyspnea ?Cards: negative for chest pain, palpitations, lower extremity edema ?GU: negative for frequency, dysuria and hematuria ?GI: Negative for abdominal pain, diarrhea, bleeding, constipation ?Skin: negative for rash and pruritus ?Heme: negative for easy bruising and gum/nose bleeding ?MS: rt sided back pain and pain radiating to the back of thigh ?Neurolo: headaches, have resolved with prednisone had dizziness, no  memory problems  ?Psych: negative for feelings of anxiety, depression  ?Endocrine:  thyroid issue ?Allergy/Immunology- alendronate dyspepsia ?? ? ?Objective:  ?VITALS:  ?BP (!) 154/86   Pulse 85   Temp 97.8 ?F (36.6 ?C) (Oral)   ?PHYSICAL EXAM:  ?General: Alert, cooperative, some distress when she tries to walk, in wheel chair ?Lungs: Clear to auscultation bilaterally. No Wheezing or Rhonchi. No rales. ?Heart: Regular rate and rhythm, no murmur, rub or gallop. ?Abdomen: Soft,  ?Extremities: atraumatic, no cyanosis. No edema. No clubbing ?Skin: No rashes or lesions. Or bruising ?Lymph: Cervical, supraclavicular normal. ?Neurologic: did not examine in detail because of pain ?Pertinent Labs ?Lab Results ? ? ?Tests result DATE comment  ?HEPC RNA 588,000 06/01/21   ?HEPC  Genotype 2a/2c 06/01/21   ?Hepatitis B profile Sag neg, s ab neg    ?Hepatitis A status reactive    ?PT/PTT 12.3/21 06/08/21   ?AST,  ALT, Bilirubin 28/27/0.6 06/01/21   ?Albumin 3.8 06/01/21   ?creatinine 1.5 05/09/21   ?HB/Platelet 12/563 05/09/21   ?HIV NR 06/08/21   ?AFP <1.8    ?TSH     ?comorbidities  GCA    ?tobacco N    ?alcohol N    ?drug N    ?     ?fibrosure 0.22 06/08/21 No fibrosis  ?Current meds     ?Ultrasound Elastography Median kPa 3.9 06/19/21 normal  ?     ?     ?  ?? ?Impression/Recommendation ?? ?Chronic hepatitis without decompensation - HEPC RNA positive at 588,000 copies and Genotype 2 c- ultrasound elastogrphy and fibrosure normal with no evidece of fibrosis or cirrhosis ? Started  treatment on 06/19/21 for 12 weeks with  Epclusa ( sofosbuvir ad velpatasvir ?100% adherent- no side effects ?Will check labs today ? ?Rt low back pain with radiation- sciatica- she needs to see her PCP or go to ED if it gets worse- No neurological deficits ? ?Giant cell arteritis/temporal artery - on prednisone- being tapered by Dr.Patel ? ? ? ?Hypothyroidism-on synthroid ?? ?___________________________________________________ ?Discussed with patient in detail ?Discussed with her husband as well ?Follow up 2 months ? ?WBC 16.3 ( raised since 3/23) will ask her to monitor her temp- if pain persist back may need MRI ? ?

## 2021-08-10 NOTE — Patient Instructions (Addendum)
You are here for follow up of HEPC- you sraeted treatment with epclusa 8 weeks ago- you now have a new rt back and leg pain which started 3 days ago and could be sciatica- you had a similar episode on the left many years ago. You are on prednisone '35mg'$  for GCA ?Will check labs today and will see you back in 2 months ?Please see your pcp for the back pain or go to the ED if worse ?

## 2021-08-11 LAB — HCV RNA QUANT: HCV Quantitative: NOT DETECTED IU/mL (ref >50)

## 2021-08-21 DIAGNOSIS — M545 Low back pain, unspecified: Secondary | ICD-10-CM | POA: Diagnosis not present

## 2021-08-21 DIAGNOSIS — R0602 Shortness of breath: Secondary | ICD-10-CM | POA: Diagnosis not present

## 2021-08-21 DIAGNOSIS — M316 Other giant cell arteritis: Secondary | ICD-10-CM | POA: Diagnosis not present

## 2021-08-21 DIAGNOSIS — Z7952 Long term (current) use of systemic steroids: Secondary | ICD-10-CM | POA: Diagnosis not present

## 2021-08-21 DIAGNOSIS — M25551 Pain in right hip: Secondary | ICD-10-CM | POA: Diagnosis not present

## 2021-08-21 DIAGNOSIS — M79604 Pain in right leg: Secondary | ICD-10-CM | POA: Diagnosis not present

## 2021-08-22 ENCOUNTER — Telehealth: Payer: Self-pay

## 2021-08-22 NOTE — Telephone Encounter (Signed)
-----   Message from Tsosie Billing, MD sent at 08/22/2021 11:42 AM EDT ----- ?Can you please let the patient know that the last lab shows the hepc medicine is working well and she has undetectable HEC Viral load- thx ? ?----- Message ----- ?From: Interface, Lab In [place] ?Sent: 08/10/2021  12:09 PM EDT ?To: Tsosie Billing, MD ? ? ?

## 2021-08-22 NOTE — Telephone Encounter (Signed)
Patient advised of Hep C lab results and verbalized understanding.  ?Amy Hensley Amy Hensley ? ?

## 2021-08-24 ENCOUNTER — Other Ambulatory Visit (HOSPITAL_COMMUNITY): Payer: Self-pay | Admitting: Rheumatology

## 2021-08-24 ENCOUNTER — Other Ambulatory Visit: Payer: Self-pay | Admitting: Rheumatology

## 2021-08-24 DIAGNOSIS — M5136 Other intervertebral disc degeneration, lumbar region: Secondary | ICD-10-CM

## 2021-09-01 ENCOUNTER — Other Ambulatory Visit (HOSPITAL_COMMUNITY): Payer: Self-pay

## 2021-09-06 ENCOUNTER — Ambulatory Visit
Admission: RE | Admit: 2021-09-06 | Discharge: 2021-09-06 | Disposition: A | Discharge status: Home / Self Care | Payer: PPO | Source: Ambulatory Visit | Attending: Rheumatology | Admitting: Rheumatology

## 2021-09-06 DIAGNOSIS — M5136 Other intervertebral disc degeneration, lumbar region: Secondary | ICD-10-CM | POA: Insufficient documentation

## 2021-09-06 DIAGNOSIS — M4726 Other spondylosis with radiculopathy, lumbar region: Secondary | ICD-10-CM | POA: Diagnosis not present

## 2021-09-06 DIAGNOSIS — M5116 Intervertebral disc disorders with radiculopathy, lumbar region: Secondary | ICD-10-CM | POA: Diagnosis not present

## 2021-09-06 IMAGING — MR MR LUMBAR SPINE W/O CM
5 series · 31 of 48 positions shown · non-contrast
Comparison: None.

CLINICAL DATA: Pain in the lower back extending into the right leg

EXAM:
MRI LUMBAR SPINE WITHOUT CONTRAST
TECHNIQUE: Multiplanar, multisequence MR imaging of the lumbar spine was
performed. No intravenous contrast was administered.

[Series 5: T2 · sagittal · 4.0mm · 0.81mm/px · 6 of 17 slices shown (1 of 2)]
[im 1/17]
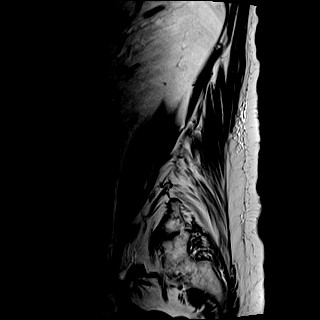
[im 4/17]
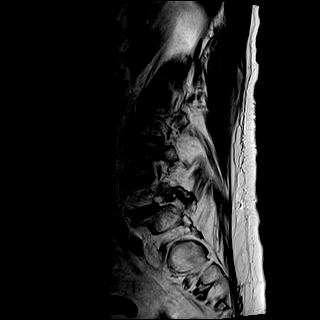
[im 7/17]
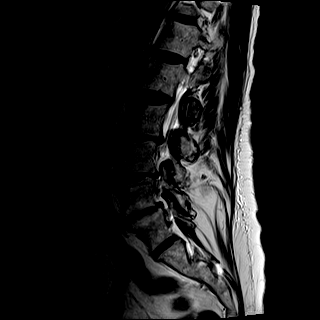
[im 10/17]
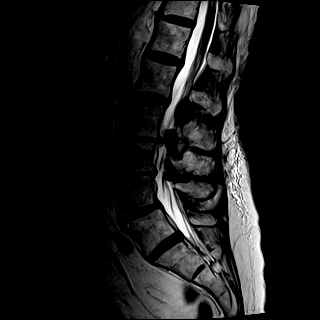
[im 13/17]
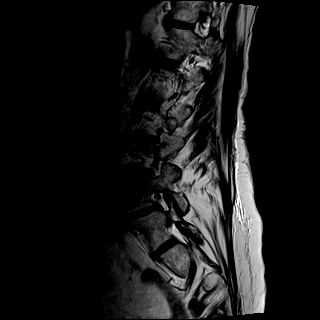
[im 17/17]
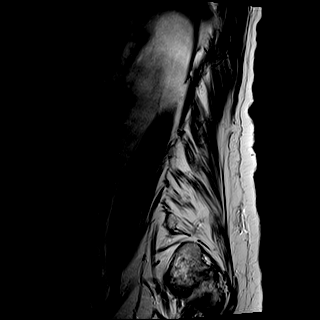

[Series 6: T1 · sagittal · 4.0mm · 0.81mm/px · 7 of 17 slices shown (1 of 2)]
[im 1/17]
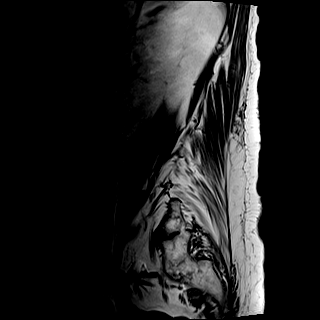
[im 3/17]
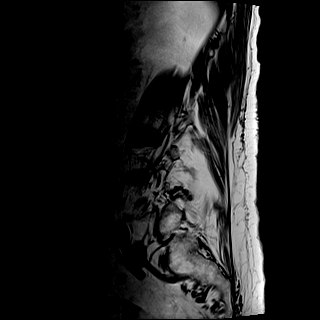
[im 6/17]
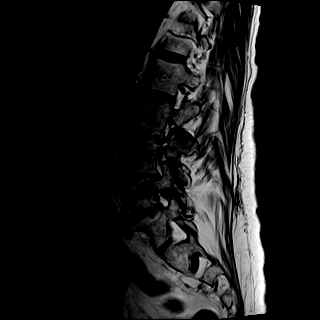
[im 9/17]
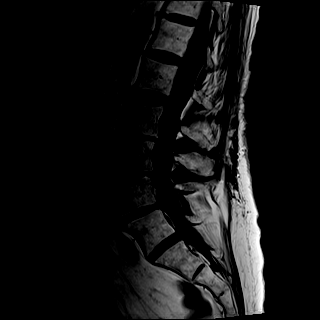
[im 11/17]
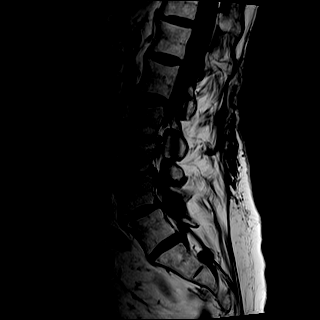
[im 14/17]
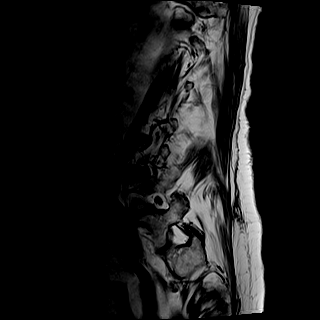
[im 17/17]
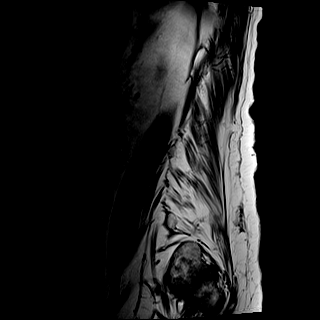

[Series 7: STIR · sagittal · 4.0mm · 0.41mm/px · 2 of 17 slices shown]
[im 1/17]
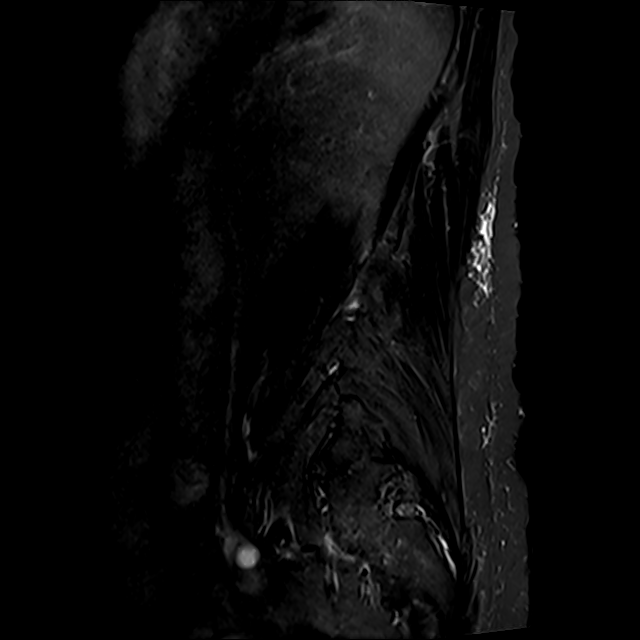
[im 3/17]
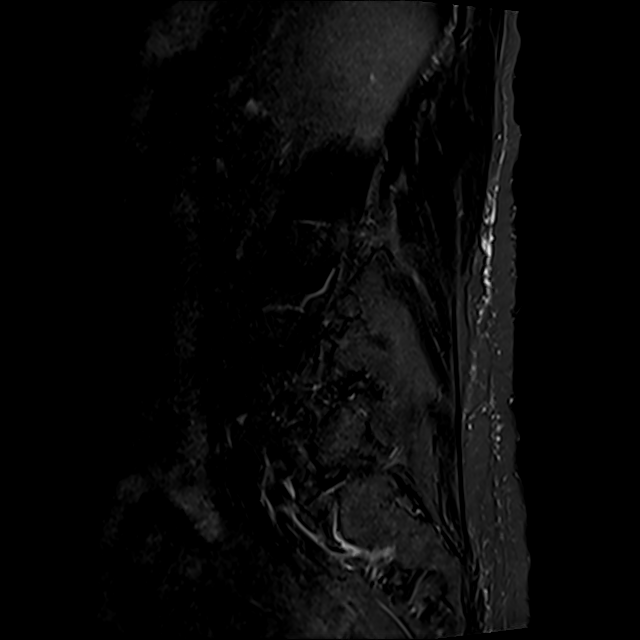

[Series 8: T2 · axial · 4.0mm · 0.78mm/px · z∈[-164,+34]mm · 8 of 36 slices shown (2 of 2)]
[im 1/36]
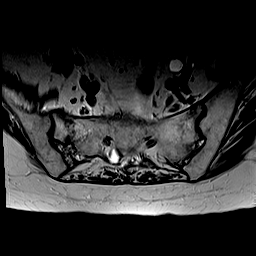
[im 6/36]
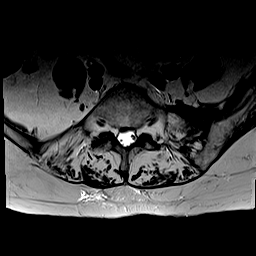
[im 11/36]
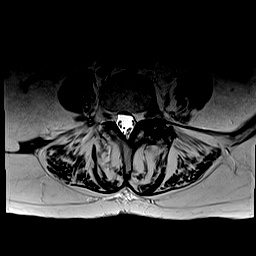
[im 17/36]
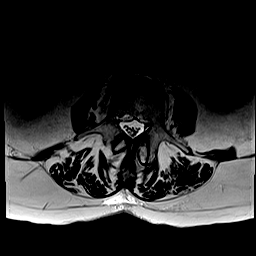
[im 19/36]
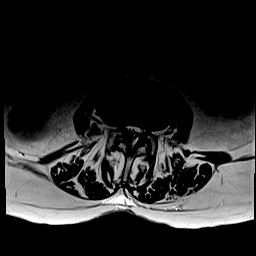
[im 25/36]
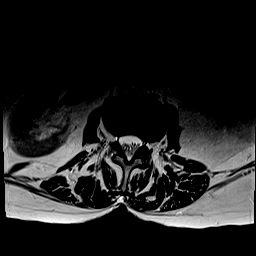
[im 30/36]
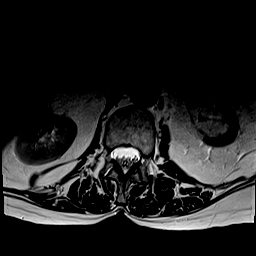
[im 36/36]
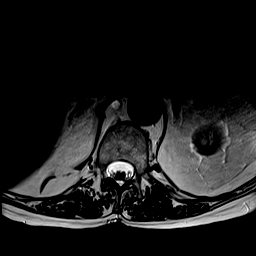

[Series 9: T1 · axial · 4.0mm · 0.39mm/px · z∈[-164,+34]mm · 8 of 36 slices shown (2 of 2)]
[im 1/36]
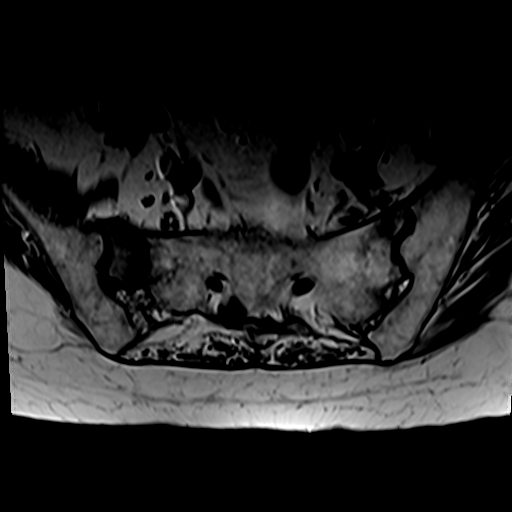
[im 6/36]
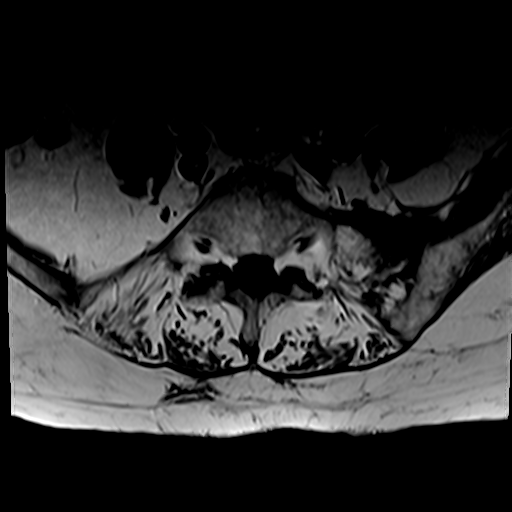
[im 11/36]
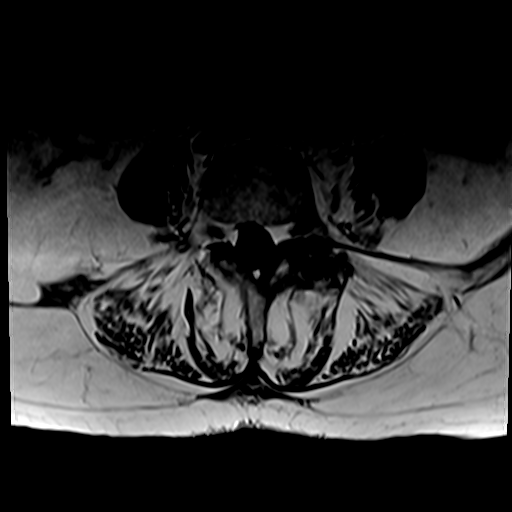
[im 17/36]
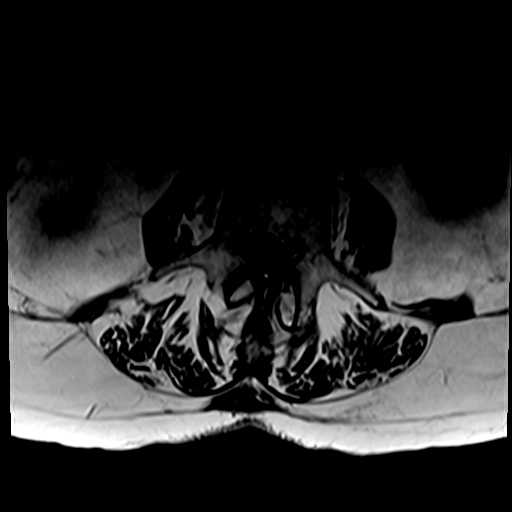
[im 19/36]
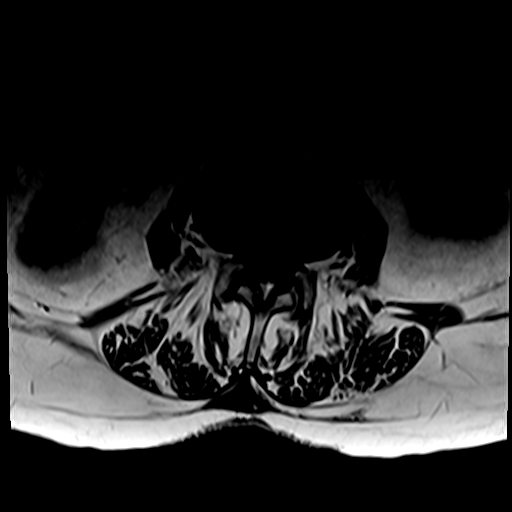
[im 25/36]
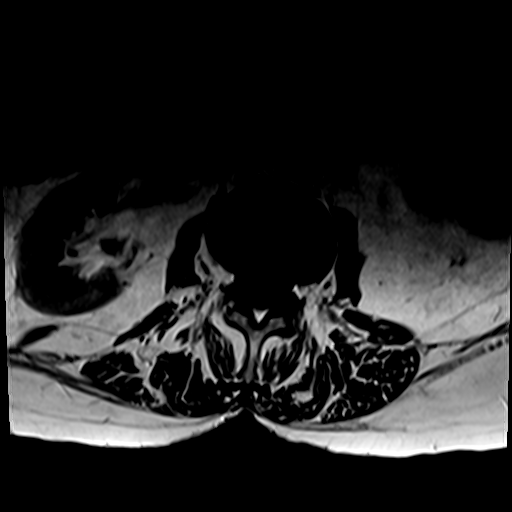
[im 30/36]
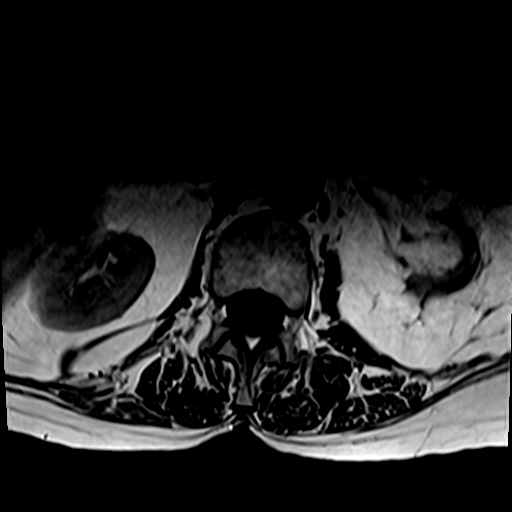
[im 36/36]
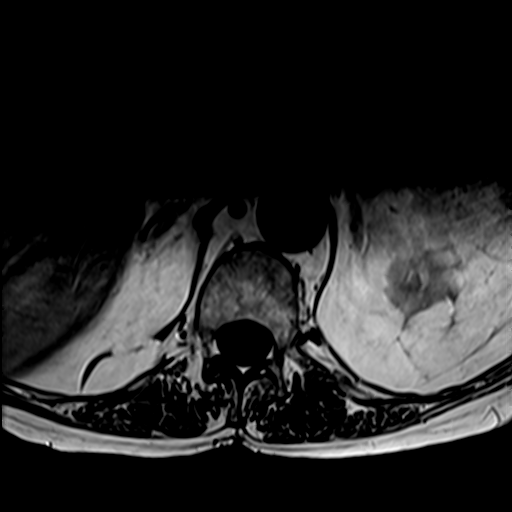

[31 of 48 positions shown; findings below may reference images not displayed]

FINDINGS: Segmentation: 5 lumbar type vertebrae based on the available
coverage

Alignment:  Hyperlordosis.  Levoscoliosis.

Vertebrae: Marrow edema around a vertical partially covered
hypointense fracture plane at the sacral ala. A left lateral sacral
fracture is also noted on axial T2 weighted imaging, although not
covered on sagittal STIR images.

Conus medullaris and cauda equina: Conus extends to the T12-L1
level. Conus and cauda equina appear normal. Small sacral Tarlov
cysts, considered incidental.

Paraspinal and other soft tissues: Prominent atrophy of intrinsic
back muscles. Asymmetric severe left renal atrophy.

Disc levels:

T12- L1: Small central disc protrusion

L1-L2: Unremarkable.

L2-L3: Disc collapse with disc bulging and endplate ridging. Mild
facet spurring. Asymmetric right subarticular recess stenosis and L3
impingement.

L3-L4: Disc narrowing and bulging with facet spurring eccentric to
the left. Patent canal and foramina

L4-L5: Degenerative facet spurring. Disc space narrowing and
bulging. No neural compression

L5-S1:Incomplete segmentation.  No neural impingement
IMPRESSION: 1. Bilateral sacral insufficiency fractures, more completely covered
on the right where there is marrow edema from acute or subacute
timing.
2. Lumbar spine degeneration with hyperlordosis and mild scoliosis.
3. L2-3 right subarticular recess stenosis impinging on the right L3
nerve root.

## 2021-09-14 DIAGNOSIS — M5416 Radiculopathy, lumbar region: Secondary | ICD-10-CM | POA: Diagnosis not present

## 2021-09-14 DIAGNOSIS — I701 Atherosclerosis of renal artery: Secondary | ICD-10-CM | POA: Diagnosis not present

## 2021-09-19 DIAGNOSIS — M316 Other giant cell arteritis: Secondary | ICD-10-CM | POA: Diagnosis not present

## 2021-09-19 DIAGNOSIS — M810 Age-related osteoporosis without current pathological fracture: Secondary | ICD-10-CM | POA: Diagnosis not present

## 2021-09-19 DIAGNOSIS — M5416 Radiculopathy, lumbar region: Secondary | ICD-10-CM | POA: Diagnosis not present

## 2021-09-19 DIAGNOSIS — R399 Unspecified symptoms and signs involving the genitourinary system: Secondary | ICD-10-CM | POA: Diagnosis not present

## 2021-09-19 DIAGNOSIS — Z7952 Long term (current) use of systemic steroids: Secondary | ICD-10-CM | POA: Diagnosis not present

## 2021-09-20 DIAGNOSIS — M316 Other giant cell arteritis: Secondary | ICD-10-CM | POA: Diagnosis not present

## 2021-09-20 DIAGNOSIS — M5416 Radiculopathy, lumbar region: Secondary | ICD-10-CM | POA: Diagnosis not present

## 2021-09-20 DIAGNOSIS — R399 Unspecified symptoms and signs involving the genitourinary system: Secondary | ICD-10-CM | POA: Diagnosis not present

## 2021-09-20 DIAGNOSIS — Z7952 Long term (current) use of systemic steroids: Secondary | ICD-10-CM | POA: Diagnosis not present

## 2021-09-20 DIAGNOSIS — M810 Age-related osteoporosis without current pathological fracture: Secondary | ICD-10-CM | POA: Diagnosis not present

## 2021-09-25 ENCOUNTER — Inpatient Hospital Stay
Admission: EM | Admit: 2021-09-25 | Discharge: 2021-09-28 | DRG: 552 | Disposition: A | Discharge status: Skilled Nursing Facility | Payer: PPO | Attending: Internal Medicine | Admitting: Internal Medicine

## 2021-09-25 ENCOUNTER — Other Ambulatory Visit: Payer: Self-pay

## 2021-09-25 DIAGNOSIS — M5116 Intervertebral disc disorders with radiculopathy, lumbar region: Secondary | ICD-10-CM | POA: Diagnosis present

## 2021-09-25 DIAGNOSIS — Z9181 History of falling: Secondary | ICD-10-CM

## 2021-09-25 DIAGNOSIS — D72829 Elevated white blood cell count, unspecified: Secondary | ICD-10-CM | POA: Diagnosis present

## 2021-09-25 DIAGNOSIS — M8448XD Pathological fracture, other site, subsequent encounter for fracture with routine healing: Secondary | ICD-10-CM | POA: Diagnosis present

## 2021-09-25 DIAGNOSIS — I129 Hypertensive chronic kidney disease with stage 1 through stage 4 chronic kidney disease, or unspecified chronic kidney disease: Secondary | ICD-10-CM | POA: Diagnosis present

## 2021-09-25 DIAGNOSIS — I10 Essential (primary) hypertension: Secondary | ICD-10-CM | POA: Diagnosis not present

## 2021-09-25 DIAGNOSIS — M316 Other giant cell arteritis: Secondary | ICD-10-CM | POA: Diagnosis present

## 2021-09-25 DIAGNOSIS — E871 Hypo-osmolality and hyponatremia: Secondary | ICD-10-CM | POA: Diagnosis present

## 2021-09-25 DIAGNOSIS — M5416 Radiculopathy, lumbar region: Secondary | ICD-10-CM | POA: Diagnosis present

## 2021-09-25 DIAGNOSIS — Z7952 Long term (current) use of systemic steroids: Secondary | ICD-10-CM | POA: Diagnosis not present

## 2021-09-25 DIAGNOSIS — R262 Difficulty in walking, not elsewhere classified: Secondary | ICD-10-CM | POA: Diagnosis not present

## 2021-09-25 DIAGNOSIS — B1920 Unspecified viral hepatitis C without hepatic coma: Secondary | ICD-10-CM | POA: Diagnosis present

## 2021-09-25 DIAGNOSIS — R2681 Unsteadiness on feet: Secondary | ICD-10-CM | POA: Diagnosis not present

## 2021-09-25 DIAGNOSIS — E039 Hypothyroidism, unspecified: Secondary | ICD-10-CM | POA: Diagnosis present

## 2021-09-25 DIAGNOSIS — T380X5A Adverse effect of glucocorticoids and synthetic analogues, initial encounter: Secondary | ICD-10-CM | POA: Diagnosis present

## 2021-09-25 DIAGNOSIS — M549 Dorsalgia, unspecified: Secondary | ICD-10-CM | POA: Diagnosis not present

## 2021-09-25 DIAGNOSIS — M6259 Muscle wasting and atrophy, not elsewhere classified, multiple sites: Secondary | ICD-10-CM | POA: Diagnosis not present

## 2021-09-25 DIAGNOSIS — M4727 Other spondylosis with radiculopathy, lumbosacral region: Secondary | ICD-10-CM | POA: Diagnosis not present

## 2021-09-25 DIAGNOSIS — E569 Vitamin deficiency, unspecified: Secondary | ICD-10-CM | POA: Diagnosis not present

## 2021-09-25 DIAGNOSIS — N1832 Chronic kidney disease, stage 3b: Secondary | ICD-10-CM | POA: Diagnosis present

## 2021-09-25 DIAGNOSIS — S3210XS Unspecified fracture of sacrum, sequela: Secondary | ICD-10-CM | POA: Diagnosis not present

## 2021-09-25 DIAGNOSIS — N179 Acute kidney failure, unspecified: Secondary | ICD-10-CM

## 2021-09-25 DIAGNOSIS — Z87891 Personal history of nicotine dependence: Secondary | ICD-10-CM

## 2021-09-25 DIAGNOSIS — G8929 Other chronic pain: Secondary | ICD-10-CM | POA: Diagnosis present

## 2021-09-25 LAB — CBC WITH DIFFERENTIAL/PLATELET
Abs Immature Granulocytes: 0.72 10*3/uL — ABNORMAL HIGH (ref 0.00–0.07)
Basophils Absolute: 0.1 10*3/uL (ref 0.0–0.1)
Basophils Relative: 0 %
Eosinophils Absolute: 0 10*3/uL (ref 0.0–0.5)
Eosinophils Relative: 0 %
HCT: 41.4 % (ref 36.0–46.0)
Hemoglobin: 13.9 g/dL (ref 12.0–15.0)
Immature Granulocytes: 5 %
Lymphocytes Relative: 2 %
Lymphs Abs: 0.4 10*3/uL — ABNORMAL LOW (ref 0.7–4.0)
MCH: 31.2 pg (ref 26.0–34.0)
MCHC: 33.6 g/dL (ref 30.0–36.0)
MCV: 93 fL (ref 80.0–100.0)
Monocytes Absolute: 0.3 10*3/uL (ref 0.1–1.0)
Monocytes Relative: 2 %
Neutro Abs: 14.5 10*3/uL — ABNORMAL HIGH (ref 1.7–7.7)
Neutrophils Relative %: 91 %
Platelets: 346 10*3/uL (ref 150–400)
RBC: 4.45 MIL/uL (ref 3.87–5.11)
RDW: 14 % (ref 11.5–15.5)
WBC: 16 10*3/uL — ABNORMAL HIGH (ref 4.0–10.5)
nRBC: 0 % (ref 0.0–0.2)

## 2021-09-25 LAB — BASIC METABOLIC PANEL
Anion gap: 12 (ref 5–15)
BUN: 33 mg/dL — ABNORMAL HIGH (ref 8–23)
CO2: 25 mmol/L (ref 22–32)
Calcium: 10 mg/dL (ref 8.9–10.3)
Chloride: 95 mmol/L — ABNORMAL LOW (ref 98–111)
Creatinine, Ser: 1.54 mg/dL — ABNORMAL HIGH (ref 0.44–1.00)
GFR, Estimated: 32 mL/min — ABNORMAL LOW (ref >60)
Glucose, Bld: 135 mg/dL — ABNORMAL HIGH (ref 70–99)
Potassium: 4.3 mmol/L (ref 3.5–5.1)
Sodium: 132 mmol/L — ABNORMAL LOW (ref 135–145)

## 2021-09-25 LAB — URINALYSIS, COMPLETE (UACMP) WITH MICROSCOPIC
Bacteria, UA: NONE SEEN
Bilirubin Urine: NEGATIVE
Glucose, UA: NEGATIVE mg/dL
Hgb urine dipstick: NEGATIVE
Ketones, ur: NEGATIVE mg/dL
Leukocytes,Ua: NEGATIVE
Nitrite: NEGATIVE
Protein, ur: NEGATIVE mg/dL
Specific Gravity, Urine: 1.012 (ref 1.005–1.030)
pH: 5 (ref 5.0–8.0)

## 2021-09-25 MED ORDER — ACETAMINOPHEN 325 MG PO TABS
650.0000 mg | ORAL_TABLET | Freq: Once | ORAL | Status: AC
  Administered 2021-09-25: 650 mg via ORAL
  Filled 2021-09-25: qty 2

## 2021-09-25 MED ORDER — SOFOSBUVIR-VELPATASVIR 400-100 MG PO TABS: 1.0000 | ORAL_TABLET | Freq: Every day | ORAL | Status: DC

## 2021-09-25 MED ORDER — ENOXAPARIN SODIUM 30 MG/0.3ML IJ SOSY
30.0000 mg | PREFILLED_SYRINGE | INTRAMUSCULAR | Status: DC
  Administered 2021-09-25 – 2021-09-27 (×3): 30 mg via SUBCUTANEOUS
  Filled 2021-09-25 (×3): qty 0.3

## 2021-09-25 MED ORDER — PREDNISONE 20 MG PO TABS
30.0000 mg | ORAL_TABLET | Freq: Every day | ORAL | Status: DC
  Administered 2021-09-26: 30 mg via ORAL
  Filled 2021-09-25: qty 2

## 2021-09-25 MED ORDER — ACETAMINOPHEN 325 MG PO TABS
650.0000 mg | ORAL_TABLET | Freq: Four times a day (QID) | ORAL | Status: DC | PRN
  Filled 2021-09-25 (×2): qty 2

## 2021-09-25 MED ORDER — ACETAMINOPHEN 650 MG RE SUPP: 650.0000 mg | Freq: Four times a day (QID) | RECTAL | Status: DC | PRN

## 2021-09-25 MED ORDER — DEXAMETHASONE SODIUM PHOSPHATE 10 MG/ML IJ SOLN
4.0000 mg | Freq: Three times a day (TID) | INTRAMUSCULAR | Status: DC
  Administered 2021-09-25 – 2021-09-26 (×2): 4 mg via INTRAVENOUS
  Filled 2021-09-25: qty 0.4
  Filled 2021-09-25: qty 1
  Filled 2021-09-25: qty 0.4

## 2021-09-25 MED ORDER — AMLODIPINE BESYLATE 5 MG PO TABS
10.0000 mg | ORAL_TABLET | Freq: Every day | ORAL | Status: DC
Start: 2021-09-26 — End: 2021-09-25

## 2021-09-25 MED ORDER — LEVOTHYROXINE SODIUM 50 MCG PO TABS
50.0000 ug | ORAL_TABLET | Freq: Every day | ORAL | Status: DC
  Administered 2021-09-26 – 2021-09-28 (×3): 50 ug via ORAL
  Filled 2021-09-25 (×3): qty 1

## 2021-09-25 MED ORDER — OXYCODONE HCL 5 MG PO TABS
5.0000 mg | ORAL_TABLET | Freq: Once | ORAL | Status: AC
  Administered 2021-09-25: 5 mg via ORAL
  Filled 2021-09-25: qty 1

## 2021-09-25 MED ORDER — ONDANSETRON HCL 4 MG/2ML IJ SOLN: 4.0000 mg | Freq: Four times a day (QID) | INTRAMUSCULAR | Status: DC | PRN

## 2021-09-25 MED ORDER — ONDANSETRON HCL 4 MG PO TABS: 4.0000 mg | ORAL_TABLET | Freq: Four times a day (QID) | ORAL | Status: DC | PRN

## 2021-09-25 MED ORDER — AMLODIPINE BESYLATE 10 MG PO TABS
10.0000 mg | ORAL_TABLET | Freq: Every day | ORAL | Status: DC
  Administered 2021-09-25 – 2021-09-27 (×3): 10 mg via ORAL
  Filled 2021-09-25 (×3): qty 1

## 2021-09-25 NOTE — ED Notes (Signed)
See triage note  presents with lower back pain  denies any recent injury  but has hx of compression fx  family at bedside ?

## 2021-09-25 NOTE — Assessment & Plan Note (Addendum)
Oxycodone with morphine as needed breakthrough pain ?Dexamethasone 3 times daily, with quick taper while continuing long term prednisone ?Consider repeating MRI if little response to dexamethasone and oxycodone.  Last MRI was 4/26. ?PT eval ?Consider IR consult for epidural steroid injection if no response to the above ?

## 2021-09-25 NOTE — Assessment & Plan Note (Signed)
-  Continue home amlodipine 

## 2021-09-25 NOTE — ED Notes (Signed)
The pt was able to stand and pivot with assistance to the bed. The pt was placed on the bed without incident. The pt was placed on a purwick. A chuck and blue depend were placed on the pt.  ?

## 2021-09-25 NOTE — Assessment & Plan Note (Signed)
Suspect related to the steroids ?We will get urinalysis to evaluate for UTI ?

## 2021-09-25 NOTE — ED Provider Notes (Signed)
? ?[facility] ?Provider Note ? ? ? Event Date/Time  ? First MD Initiated Contact with Patient 09/25/21 1651   ?  (approximate) ? ? ?History  ? ?No chief complaint on file. ? ? ?HPI ? ?Amy Hensley is a 88 y.o. female with history of hypertension, hep C, giant cell arteritis, and as listed in EMR presents to the emergency department for treatment of acute on chronic low back pain that radiates into the right lower extremity. She has been on prednisone for GCA for a long time and rheumatology is trying to wean her off. Yesterday, her back pain intensified to the point where she is no longer able to ambulate even with a walker. She did take 2 additional prednisone tablets which helped but pain has since returned. No relief today with Tramadol. No change in pain pattern or new symptoms. No weakness in the lower extremities, but she is afraid to stand due to the amount of pain and is afraid she will fall. ? ?  ? ? ?Physical Exam  ? ?Triage Vital Signs: ?ED Triage Vitals [09/25/21 1527]  ?Enc Vitals Group  ?   BP (!) 154/79  ?   Pulse Rate 88  ?   Resp 16  ?   Temp 98.2 ?F (36.8 ?C)  ?   Temp Source Oral  ?   SpO2 92 %  ?   Weight   ?   Height   ?   Head Circumference   ?   Peak Flow   ?   Pain Score   ?   Pain Loc   ?   Pain Edu?   ?   Excl. in [place]?   ? ? ?Most recent vital signs: ?Vitals:  ? 09/25/21 1527 09/25/21 1923  ?BP: (!) 154/79 (!) 165/86  ?Pulse: 88 81  ?Resp: 16 20  ?Temp: 98.2 ?F (36.8 ?C)   ?SpO2: 92% 97%  ? ? ?General: Awake, no distress.  ?CV:  Good peripheral perfusion.  ?Resp:  Normal effort.  ?Abd:  No distention.  ?Other:  Transverse lower back pain with radiation into right groin and inner right thigh to knee. Unable to perform straight leg raise. 5/5 strength lower extremities.  ? ? ?ED Results / Procedures / Treatments  ? ?Labs ?(all labs ordered are listed, but only abnormal results are displayed) ?Labs Reviewed  ?BASIC METABOLIC PANEL - Abnormal; Notable for the following  components:  ?    Result Value  ? Sodium 132 (*)   ? Chloride 95 (*)   ? Glucose, Bld 135 (*)   ? BUN 33 (*)   ? Creatinine, Ser 1.54 (*)   ? GFR, Estimated 32 (*)   ? All other components within normal limits  ?CBC WITH DIFFERENTIAL/PLATELET - Abnormal; Notable for the following components:  ? WBC 16.0 (*)   ? Neutro Abs 14.5 (*)   ? Lymphs Abs 0.4 (*)   ? Abs Immature Granulocytes 0.72 (*)   ? All other components within normal limits  ?URINALYSIS, ROUTINE W REFLEX MICROSCOPIC  ? ? ? ?EKG ? ?Not indicated. ? ? ?RADIOLOGY ? ?Image and radiology report reviewed by me. ? ?Not indicated. ? ?PROCEDURES: ? ?Critical Care performed: No ? ?Procedures ? ? ?MEDICATIONS ORDERED IN ED: ?Medications  ?acetaminophen (TYLENOL) tablet 650 mg (650 mg Oral Given 09/25/21 1811)  ?oxyCODONE (Oxy IR/ROXICODONE) immediate release tablet 5 mg (5 mg Oral Given 09/25/21 1919)  ? ? ? ?IMPRESSION / MDM /  ASSESSMENT AND PLAN / ED COURSE  ? ?I have reviewed the triage note. ? ?Differential diagnosis includes, but is not limited to intractable pain, lumbar radiculopathy.  ? ?88 year old female presenting to the emergency department for treatment and evaluation of low back pain that radiates into the right lower extremity.  This is similar to her chronic pain with the exception of intensity.  She has had no new symptoms.  She is now unable to use her walker or get up to go to the bathroom.  She has been decreasing her dose of prednisone that she is on chronically for giant cell arteritis due to some side effects.  Since she has started tapering, pain has gotten worse in the lumbar area.  Yesterday, she took 2 additional tablets of prednisone which did help but now that it has started to wear off and has gotten worse. She is scheduled with physiatry for consult tomorrow but daughter states that she is not going to be able to get her in the car to go to the appointment.  She had to arrive to the emergency department today via EMS.  Daughter also  states that they had attempted to call rheumatology to discuss other options/medications however they did not receive a call until a few minutes prior to arrival. ? ?Dr. Posey Pronto in rheumatology outpatient note reviewed from Sep 19, 2021 as well as labs and urinalysis from that day.  Labs were unremarkable, however she did have indication of UTI and is currently on antibiotics.  Patient states that she has felt some better after starting antibiotics but pain in her back is not improved. ? ?MRI from September 06, 2021 reviewed she does have bilateral sacral insufficiency fracture as well as lumbar spine degeneration and L2-3 right subarticular recess stenosis impinging on the right L3 nerve root.  Focal images of the sacrum or recommended however patient and family declined. ? ?I have attempted to get in touch with Dr. Posey Pronto in rheumatology to discuss possibility of increasing the dosage of prednisone until she is able to see physiatry to determine whether or not she is a candidate for [facility], however he is unavailable.  No rheumatology on-call for me today. ? ?Plan was discussed with the patient and family.  We will get some labs including urinalysis and attempt to treat her pain and get her up on her walker.  If this is unsuccessful, she will likely need admission for pain control, PT, OT, and potentially rehab.  ? ?Patient remains unable to stand despite pain medications. Discussed with Dr. Damita Dunnings who will admit. ?  ? ? ?FINAL CLINICAL IMPRESSION(S) / ED DIAGNOSES  ? ?Final diagnoses:  ?Lumbar radiculopathy  ? ? ? ?Rx / DC Orders  ? ?ED Discharge Orders   ? ? None  ? ?  ? ? ? ?Note:  This document was prepared using Dragon voice recognition software and may include unintentional dictation errors. ?  ?Victorino Dike, FNP ?09/25/21 2206 ? ?  ?Blake Divine, MD ?09/25/21 2334 ? ?

## 2021-09-25 NOTE — Assessment & Plan Note (Signed)
Renal function stable.

## 2021-09-25 NOTE — Assessment & Plan Note (Addendum)
Completed a 12-week course of Epclusa from February to May 2023 ?

## 2021-09-25 NOTE — Assessment & Plan Note (Signed)
Continue levothyroxine 

## 2021-09-25 NOTE — Assessment & Plan Note (Addendum)
Currently on a long taper guided by her rheumatologist.  Current prednisone 30 mg daily dose continued ?

## 2021-09-25 NOTE — Assessment & Plan Note (Addendum)
On long-term prednisone therapy, currently on a long taper ?

## 2021-09-25 NOTE — Assessment & Plan Note (Signed)
Management as above °

## 2021-09-25 NOTE — H&P (Signed)
?History and Physical  ? ? ?Patient: Amy Hensley YIR:[id number] DOB: 1932-10-10 ?DOA: 09/25/2021 ?DOS: the patient was seen and examined on 09/25/2021 ?PCP: Adin Hector, MD  ?Patient coming from: Home ? ?Chief Complaint: No chief complaint on file. ? ? ?HPI: Amy Hensley is a 88 y.o. female with medical history significant for HTN, hep C, giant cell arteritis on long-term prednisone therapy currently being tapered, lumbar radiculopathy followed by orthopedist and recently referred to physiatry, history of fall a month prior with sacral insufficiency fractures, who comes in worsening of her chronic low back pain with radiation down into her right lower extremity.  She states that her symptoms have been worsening since her prednisone taper was started.  She saw her orthopedist on 5/9 (records reviewed) where she was referred to physiatry for epidural steroid injections, however over the past couple days due to worsening pain, patient has had increasing pain to where she is unable to ambulate and transfer.  She called her orthopedist on the day of arrival (as reviewed in the records) who recommended going back up on her prednisone.  Due to concerns for decreased ability to ambulate and decreased participation in ADLs, family brought her in to the ED for assessment.  She has no bowel or bladder incontinence or retention. ?ED course and data review: On arrival BP 154/79 with otherwise normal vitals.  WBC 16,000, sodium 132, creatinine at baseline at 1.54.  Urinalysis unavailable and no imaging repeated. ?Patient was given a dose of oxycodone.  Hospitalist consulted for admission for pain control.  ? ?Review of Systems: As mentioned in the history of present illness. All other systems reviewed and are negative. ?Past Medical History:  ?Diagnosis Date  ? Arthritis   ? Chronic kidney disease   ? stage 3  ? Hypertension   ? Hypothyroidism   ? ?Past Surgical History:  ?Procedure Laterality Date  ? ABDOMINAL  HYSTERECTOMY    ? ABDOMINAL SURGERY    ? tacked up uterus  ? APPENDECTOMY    ? ARTERY BIOPSY N/A 05/19/2021  ? Procedure: BIOPSY TEMPORAL ARTERY;  Surgeon: Herbert Pun, MD;  Location: [facility] ORS;  Service: General;  Laterality: N/A;  ? CATARACT EXTRACTION W/PHACO Left 10/04/2016  ? Procedure: CATARACT EXTRACTION PHACO AND INTRAOCULAR LENS PLACEMENT (IOC);  Surgeon: Eulogio Bear, MD;  Location: [facility] ORS;  Service: Ophthalmology;  Laterality: Left;  us02:17.8 ?ap%19.6 ?cde27.31 ?fluid lot # [id number]  ? CESAREAN SECTION    ? EYE SURGERY    ? INGUINAL HERNIA REPAIR Right 12/08/2018  ? Procedure: OPEN RIGHT HERNIA REPAIR INGUINAL ADULT;  Surgeon: Herbert Pun, MD;  Location: [facility] ORS;  Service: General;  Laterality: Right;  ? ?Social History:  reports that she has quit smoking. Her smoking use included cigarettes. She smoked an average of 1 pack per day. She has never used smokeless tobacco. She reports that she does not drink alcohol and does not use drugs. ? ?Allergies  ?Allergen Reactions  ? Alendronate Other (See Comments)  ?  Dyspepsia (indigestion)   ? Buspirone Other (See Comments)  ?  sedation  ? ? ?No family history on file. ? ?Prior to Admission medications   ?Medication Sig Start Date End Date Taking? Authorizing Provider  ?acetaminophen (TYLENOL) 325 MG tablet Take 650 mg by mouth every 6 (six) hours as needed.    [provider]  ?amLODipine (NORVASC) 10 MG tablet Take 10 mg by mouth daily.    [provider]  ?Calcium Carb-Cholecalciferol  600-800 MG-UNIT TABS Take 1 tablet by mouth daily.    [provider]  ?Cholecalciferol (VITAMIN D3 PO) Take 1 tablet by mouth daily.    [provider]  ?Cyanocobalamin (VITAMIN B-12 PO) Take 1 tablet by mouth daily.    [provider]  ?esomeprazole (NEXIUM) 20 MG capsule Take by mouth. 05/18/21 05/18/22  [provider]  ?levothyroxine (SYNTHROID) 50 MCG tablet Take 50 mcg by mouth daily before  breakfast.  07/09/16   [provider]  ?predniSONE (DELTASONE) 20 MG tablet Take 20 mg by mouth. 05/18/21   [provider]  ?Sofosbuvir-Velpatasvir (EPCLUSA) 400-100 MG TABS Take 1 tablet by mouth daily. 06/16/21   Kuppelweiser, Cassie L, RPH-CPP  ? ? ?Physical Exam: ?Vitals:  ? 09/25/21 1527 09/25/21 1715 09/25/21 1923  ?BP: (!) 154/79  (!) 165/86  ?Pulse: 88  81  ?Resp: 16  20  ?Temp: 98.2 ?F (36.8 ?C)    ?TempSrc: Oral    ?SpO2: 92%  97%  ?Weight:  59.9 kg   ?Height:  5' 3.5" (1.613 m)   ? ?Physical Exam ?Vitals and nursing note reviewed.  ?Constitutional:   ?   General: She is not in acute distress. ?HENT:  ?   Head: Normocephalic and atraumatic.  ?Cardiovascular:  ?   Rate and Rhythm: Normal rate and regular rhythm.  ?   Heart sounds: Normal heart sounds.  ?Pulmonary:  ?   Effort: Pulmonary effort is normal.  ?   Breath sounds: Normal breath sounds.  ?Abdominal:  ?   Palpations: Abdomen is soft.  ?   Tenderness: There is no abdominal tenderness.  ?Neurological:  ?   Mental Status: Mental status is at baseline.  ? ? ? ?Data Reviewed: ?Relevant notes from primary care and specialist visits, past discharge summaries as available in EHR, including Care Everywhere. ?Prior diagnostic testing as pertinent to current admission diagnoses ?Updated medications and problem lists for reconciliation ?ED course, including vitals, labs, imaging, treatment and response to treatment ?Triage notes, nursing and pharmacy notes and ED provider's notes ?Notable results as noted in HPI ? ? ?Assessment and Plan: ?* Intractable back pain ?Oxycodone with morphine as needed breakthrough pain ?Dexamethasone 3 times daily, with quick taper while continuing long term prednisone ?Consider repeating MRI if little response to dexamethasone and oxycodone.  Last MRI was 4/26. ?PT eval ?Consider IR consult for epidural steroid injection if no response to the above ? ?Ambulatory dysfunction ?Management as  above ? ?Leukocytosis ?Suspect related to the steroids ?We will get urinalysis to evaluate for UTI ? ?History of fall 08/2021 with sacral insufficiency fractures ?Patient has had no additional falls and has been ambulatory since her last fall ? ?Lumbar radiculopathy ?Management as above ? ?Long term (current) use of systemic steroids ?Currently on a long taper guided by her rheumatologist.  Current prednisone 30 mg daily dose continued ? ?GCA (giant cell arteritis) ([place]) ?On long-term prednisone therapy, currently on a long taper ? ?Hepatitis C ?Completed a 12-week course of Epclusa from February to May 2023 ? ?Hypothyroidism ?Continue levothyroxine ? ?Hypertension ?Continue home amlodipine ? ?Stage 3b chronic kidney disease ([place]) ?Renal function stable ? ? ? ? ? ? ?Advance Care Planning:   Code Status: Full Code  ? ?Consults: none ? ?Family Communication: none ? ?Severity of Illness: ?The appropriate patient status for this patient is OBSERVATION. Observation status is judged to be reasonable and necessary in order to provide the required intensity of service to ensure the patient's  safety. The patient's presenting symptoms, physical exam findings, and initial radiographic and laboratory data in the context of their medical condition is felt to place them at decreased risk for further clinical deterioration. Furthermore, it is anticipated that the patient will be medically stable for discharge from the hospital within 2 midnights of admission.  ? ?Author: ?Athena Masse, MD ?09/25/2021 8:34 PM ? ?For on call review [email or web address].  ?

## 2021-09-25 NOTE — Assessment & Plan Note (Signed)
Patient has had no additional falls and has been ambulatory since her last fall ?

## 2021-09-25 NOTE — ED Triage Notes (Signed)
Pt comes into the ED via EMS from home with c/o lower back pain, has a hx of compression fx and herniated disc.  ? ?151/82 ?HR100 ?95%RA ?

## 2021-09-26 ENCOUNTER — Observation Stay: Payer: PPO | Admitting: Radiology

## 2021-09-26 ENCOUNTER — Encounter: Payer: Self-pay | Admitting: Internal Medicine

## 2021-09-26 DIAGNOSIS — M4727 Other spondylosis with radiculopathy, lumbosacral region: Secondary | ICD-10-CM | POA: Diagnosis not present

## 2021-09-26 DIAGNOSIS — M316 Other giant cell arteritis: Secondary | ICD-10-CM

## 2021-09-26 DIAGNOSIS — M549 Dorsalgia, unspecified: Secondary | ICD-10-CM

## 2021-09-26 DIAGNOSIS — R262 Difficulty in walking, not elsewhere classified: Secondary | ICD-10-CM

## 2021-09-26 HISTORY — PX: IR INJECT/THERA/INC NEEDLE/CATH/PLC EPI/LUMB/SAC W/IMG: IMG6130

## 2021-09-26 IMAGING — XA Imaging study
1 series · 2 of 2 positions shown · non-contrast
Comparison: none

CLINICAL DATA: 89-year-old female with lumbosacral spondylosis
without myelopathy. She has a significant right L3 radiculopathy
which has been recalcitrant to conservative management. She presents
for right L3 nerve root block and transforaminal epidural steroid
injection.

[Series 1: vasc extremity · 2 of 2 slices shown]
[im 1/2]
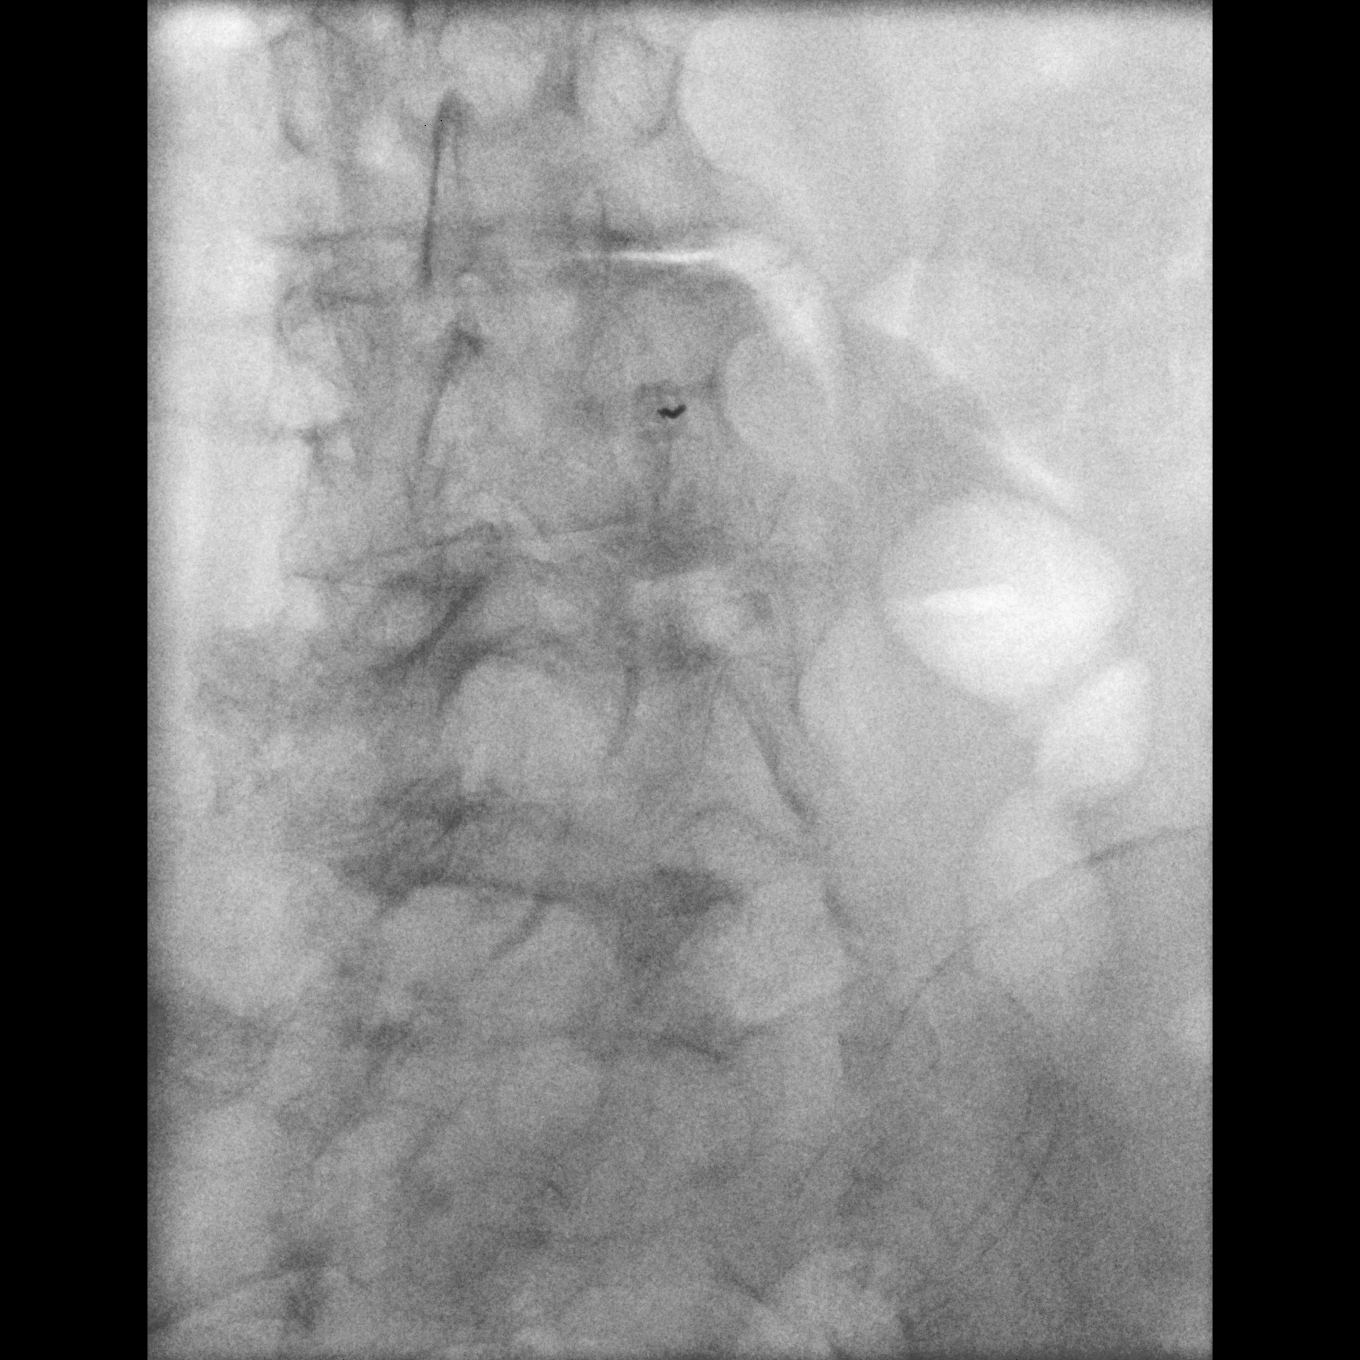
[im 2/2]
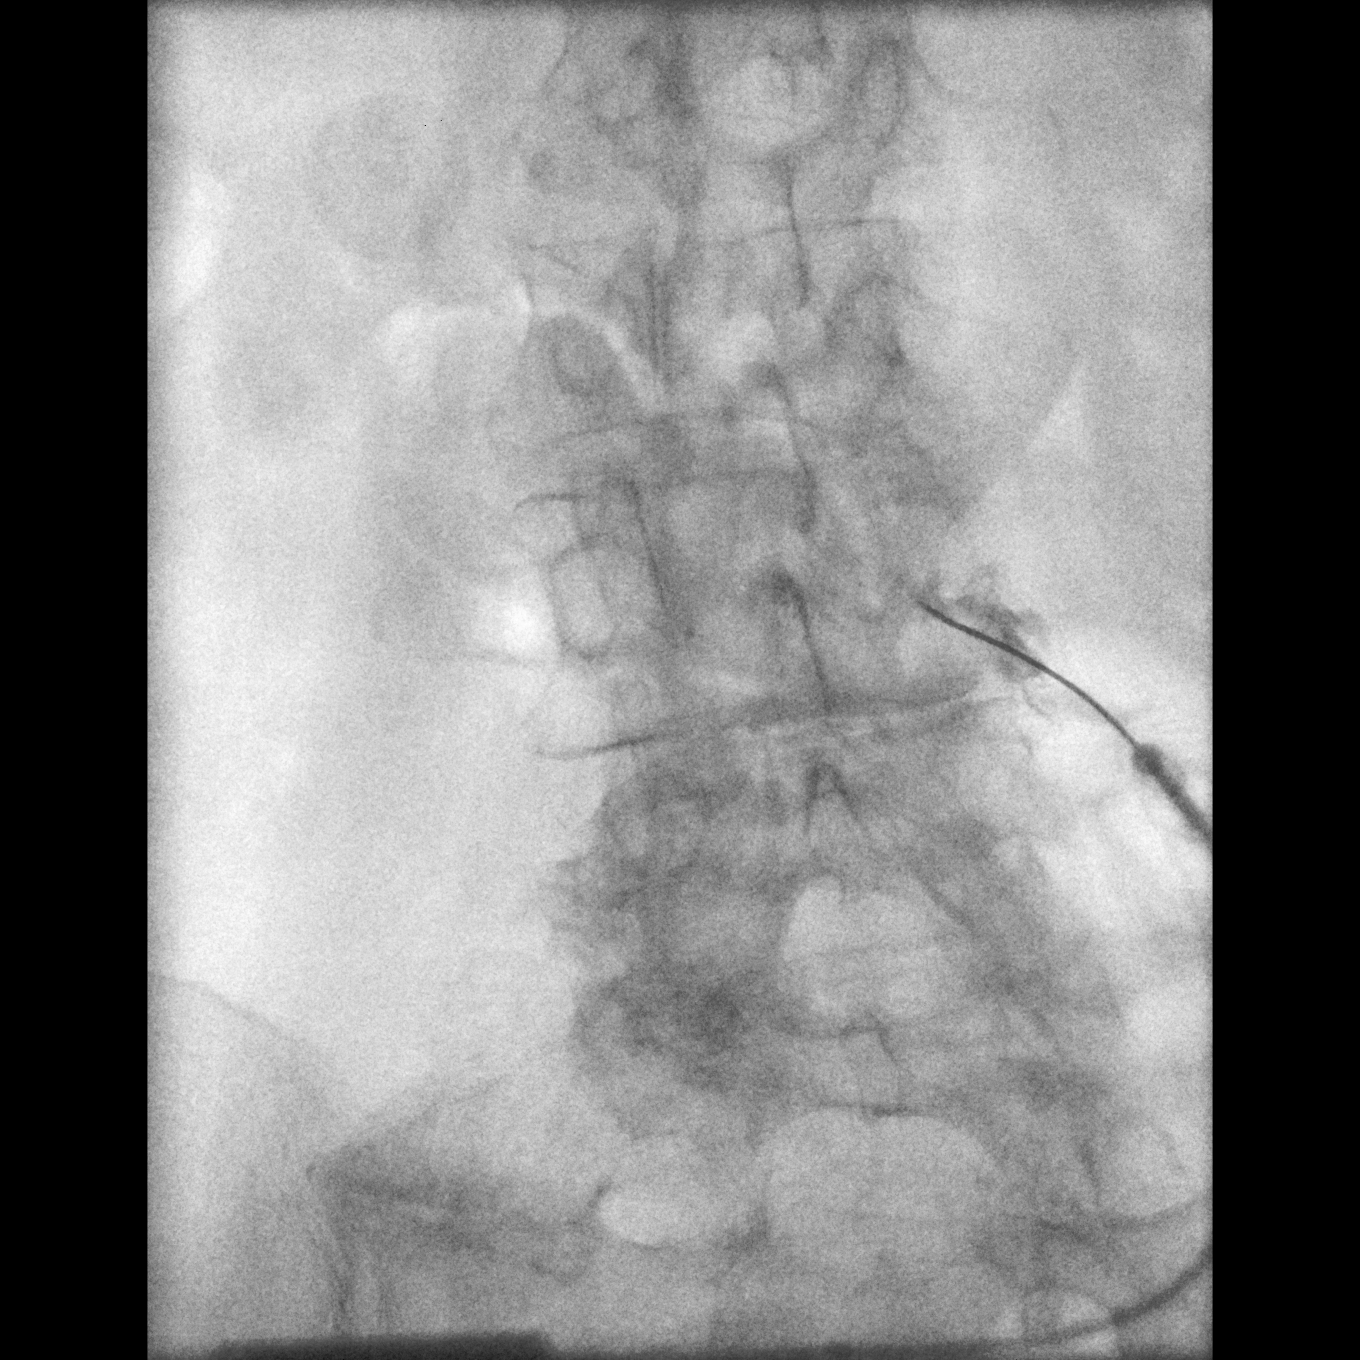

[2 of 2 positions shown; findings below may reference images not displayed]

EXAM:
IR INJECT/DEL VECCHIO/INC NEEDLE/DEL VECCHIO/DEL VECCHIO EPI/DEL VECCHIO/SAC W/IMG

FLUOROSCOPY TIME:  Radiation Exposure Index (as provided by the
fluoroscopic device): 19.6 mGy Kerma

PROCEDURE:
The procedure, risks, benefits, and alternatives were explained to
the patient. Questions regarding the procedure were encouraged and
answered. The patient understands and consents to the procedure.

RIGHT L3 NERVE ROOT BLOCK AND TRANSFORAMINAL EPIDURAL: A posterior
oblique approach was taken to the intervertebral foramen on the
right at L3 using a curved 22 gauge spinal needle. Injection of
Omnipaque 180 outlined the L3 nerve root and showed good epidural
spread. No vascular opacification is seen. Eighty mg of Depo-Medrol
mixed with 2 mL 1% lidocaine were instilled. The procedure was
well-tolerated, and the patient was discharged thirty minutes
following the injection in good condition.

COMPLICATIONS:
None
IMPRESSION: Technically successful injection consisting of a right L3 nerve root
block and transforaminal epidural.

## 2021-09-26 MED ORDER — LIDOCAINE HCL (PF) 1 % IJ SOLN
INTRAMUSCULAR | Status: AC
  Filled 2021-09-26: qty 30

## 2021-09-26 MED ORDER — IOHEXOL 180 MG/ML  SOLN
5.0000 mL | Freq: Once | INTRAMUSCULAR | Status: AC | PRN
  Administered 2021-09-26: 5 mL via INTRATHECAL

## 2021-09-26 MED ORDER — PREDNISONE 50 MG PO TABS
25.0000 mg | ORAL_TABLET | Freq: Every day | ORAL | Status: DC
  Administered 2021-09-27 – 2021-09-28 (×2): 25 mg via ORAL
  Filled 2021-09-26 (×2): qty 1

## 2021-09-26 MED ORDER — METHYLPREDNISOLONE ACETATE 80 MG/ML IJ SUSP
INTRAMUSCULAR | Status: DC | PRN
  Administered 2021-09-26: 80 mg

## 2021-09-26 MED ORDER — OXYCODONE HCL 5 MG PO TABS
5.0000 mg | ORAL_TABLET | Freq: Once | ORAL | Status: AC
  Administered 2021-09-26: 5 mg via ORAL
  Filled 2021-09-26: qty 1

## 2021-09-26 MED ORDER — METHYLPREDNISOLONE ACETATE 80 MG/ML IJ SUSP
INTRAMUSCULAR | Status: AC
  Filled 2021-09-26: qty 1

## 2021-09-26 MED ORDER — OXYCODONE HCL 5 MG PO TABS
5.0000 mg | ORAL_TABLET | Freq: Three times a day (TID) | ORAL | Status: DC | PRN
  Administered 2021-09-26 – 2021-09-27 (×3): 5 mg via ORAL
  Filled 2021-09-26 (×3): qty 1

## 2021-09-26 NOTE — TOC Progression Note (Signed)
Transition of Care (TOC) - Progression Note  ? ? ?Patient Details  ?Name: Amy Hensley ?MRN: [id number] ?Date of Birth: [birth year] ? ?Transition of Care (TOC) CM/SW Contact  ?Conception Oms, RN ?Phone Number: ?09/26/2021, 1:25 PM ? ?Clinical Narrative:    ? ?Patient is agreeable to a bedsearch for STR SNF ?Bedsearch sent, PASSR obtained, Fl2 completed, will review bed offers once obtained ? ?  ?  ? ?Expected Discharge Plan and Services ?  ?  ?  ?  ?  ?                ?  ?  ?  ?  ?  ?  ?  ?  ?  ?  ? ? ?Social Determinants of Health (SDOH) Interventions ?  ? ?Readmission Risk Interventions ?   ? View : No data to display.  ?  ?  ?  ? ? ?

## 2021-09-26 NOTE — Plan of Care (Signed)
?  Problem: Education: ?Goal: Knowledge of General Education information will improve ?Description: Including pain rating scale, medication(s)/side effects and non-pharmacologic comfort measures ?Outcome: Progressing ?  ?Problem: Nutrition: ?Goal: Adequate nutrition will be maintained ?Outcome: Progressing ?  ?Problem: Activity: ?Goal: Risk for activity intolerance will decrease ?Outcome: Progressing ?  ?Problem: Elimination: ?Goal: Will not experience complications related to bowel motility ?Outcome: Progressing ?Goal: Will not experience complications related to urinary retention ?Outcome: Progressing ?  ?

## 2021-09-26 NOTE — Progress Notes (Addendum)
? ? ? ?Progress Note  ? ? ?Amy Hensley  FTD:[id number] DOB: Nov 22, 1932  DOA: 09/25/2021 ?PCP: Adin Hector, MD  ? ? ? ? ?Brief Narrative:  ? ? ?Medical records reviewed and are as summarized below: ? ?Amy Hensley is a 88 y.o. female with medical history significant for HTN, hep C, giant cell arteritis on long-term prednisone therapy, lumbar radiculopathy followed by orthopedist, history of fall a month prior with sacral insufficiency fractures. She noticed worsening of her back pain when attempt was made to taper down her prednisone.  She saw her orthopedic surgeon on 09/19/2021 and was referred to see a physiatrist for consideration of epidural steroid injection. She presented to the hospital because of acute on chronic low back pain with radiation down her right lower extremity and difficulty ambulating.  ? ?She was admitted to the hospital for pain control. ? ? ? ? ?Assessment/Plan:  ? ?Principal Problem: ?  Intractable back pain ?Active Problems: ?  Ambulatory dysfunction ?  Lumbar radiculopathy ?  History of fall 08/2021 with sacral insufficiency fractures ?  Leukocytosis ?  GCA (giant cell arteritis) ([place]) ?  Long term (current) use of systemic steroids ?  Stage 3b chronic kidney disease ([place]) ?  Hypertension ?  Hypothyroidism ?  Hepatitis C ? ? ? ?Body mass index is 23.03 kg/m?. ? ? ?Severe low back pain, ambulatory dysfunction, sacral insufficiency fractures, lumbar degenerative disc disease, recent fall: Continue analgesics.  Consulted orthopedic surgeon to assist with management.  IR has been consulted for consideration of sacroplasty versus epidural spinal injection. ? ?Giant cell arteritis: Continue prednisone.  She said she takes prednisone 25 mg daily ? ?Other comorbidities include chronic hyponatremia, hypothyroidism, hypertension, hepatitis C (completed a 12-week course of Epclusa in May 2023), CKD stage IIIa ? ? ? ?Diet Order   ? ?       ?  Diet Heart Room service appropriate? Yes; Fluid  consistency: Thin  Diet effective now       ?  ? ?  ?  ? ?  ? ? ? ? ? ? ? ? ? ? ? ?Consultants: ?Interventional radiologist, orthopedic surgeon ? ?Procedures: ?None ? ? ? ?Medications:  ? ? amLODipine  10 mg Oral QHS  ? enoxaparin (LOVENOX) injection  30 mg Subcutaneous Q24H  ? levothyroxine  50 mcg Oral QAC breakfast  ? [START ON 09/27/2021] predniSONE  25 mg Oral Q breakfast  ? ?Continuous Infusions: ? ? ?Anti-infectives (From admission, onward)  ? ? Start     Dose/Rate Route Frequency Ordered Stop  ? 09/25/21 2045  Sofosbuvir-Velpatasvir 400-100 MG TABS 1 tablet  Status:  Discontinued       ? 1 tablet Oral Daily 09/25/21 2042 09/25/21 2107  ? ?  ? ? ? ? ? ? ? ? ? ?Family Communication/Anticipated D/C date and plan/Code Status  ? ?DVT prophylaxis: enoxaparin (LOVENOX) injection 30 mg Start: 09/25/21 2030 ? ? ?  Code Status: Full Code ? ?Family Communication: Plan discussed with her daughter-in-law at the bedside plan to discharge to SNF ?Disposition Plan: Plan to discharge to SNF ? ? ?Status is: Observation ?The patient will require care spanning > 2 midnights and should be moved to inpatient because: Severe low back pain, placement to SNF ? ? ? ? ? ? ?Subjective:  ? ?She complains of severe low back pain and difficulty ambulating because of back pain. ? ?Objective:  ? ? ?Vitals:  ? 09/25/21 2256 09/26/21 0353 09/26/21 0623  09/26/21 1127  ?BP: (!) 152/82 139/71 (!) 144/78 129/69  ?Pulse: 81 79 85 81  ?Resp: '14 16 18 15  '$ ?Temp: 98 ?F (36.7 ?C) 98.5 ?F (36.9 ?C) 98 ?F (36.7 ?C) 98.2 ?F (36.8 ?C)  ?TempSrc:      ?SpO2: 94% 93% 93% 93%  ?Weight:      ?Height:      ? ?No data found. ? ? ?Intake/Output Summary (Last 24 hours) at 09/26/2021 1457 ?Last data filed at 09/26/2021 0510 ?Gross per 24 hour  ?Intake --  ?Output 650 ml  ?Net -650 ml  ? ?Filed Weights  ? 09/25/21 1715  ?Weight: 59.9 kg  ? ? ?Exam: ? ?GEN: NAD ?SKIN: No rash ?EYES: EOMI ?ENT: MMM ?CV: RRR ?PULM: CTA B ?ABD: soft, ND, NT, +BS ?CNS: AAO x 3, non  focal ?EXT: No edema or tenderness ?MSK: Sacral spinal and paraspinal tenderness ? ? ? ?  ? ? ?Data Reviewed:  ? ?I have personally reviewed following labs and imaging studies: ? ?Labs: ?Labs show the following:  ? ?Basic Metabolic Panel: ?Recent Labs  ?Lab 09/25/21 ?1812  ?NA 132*  ?K 4.3  ?CL 95*  ?CO2 25  ?GLUCOSE 135*  ?BUN 33*  ?CREATININE 1.54*  ?CALCIUM 10.0  ? ?GFR ?Estimated Creatinine Clearance: 21.8 mL/min (A) (by C-G formula based on SCr of 1.54 mg/dL (H)). ?Liver Function Tests: ?No results for input(s): AST, ALT, ALKPHOS, BILITOT, PROT, ALBUMIN in the last 168 hours. ?No results for input(s): LIPASE, AMYLASE in the last 168 hours. ?No results for input(s): AMMONIA in the last 168 hours. ?Coagulation profile ?No results for input(s): INR, PROTIME in the last 168 hours. ? ?CBC: ?Recent Labs  ?Lab 09/25/21 ?1812  ?WBC 16.0*  ?NEUTROABS 14.5*  ?HGB 13.9  ?HCT 41.4  ?MCV 93.0  ?PLT 346  ? ?Cardiac Enzymes: ?No results for input(s): CKTOTAL, CKMB, CKMBINDEX, TROPONINI in the last 168 hours. ?BNP (last 3 results) ?No results for input(s): PROBNP in the last 8760 hours. ?CBG: ?No results for input(s): GLUCAP in the last 168 hours. ?D-Dimer: ?No results for input(s): DDIMER in the last 72 hours. ?Hgb A1c: ?No results for input(s): HGBA1C in the last 72 hours. ?Lipid Profile: ?No results for input(s): CHOL, HDL, LDLCALC, TRIG, CHOLHDL, LDLDIRECT in the last 72 hours. ?Thyroid function studies: ?No results for input(s): TSH, T4TOTAL, T3FREE, THYROIDAB in the last 72 hours. ? ?Invalid input(s): FREET3 ?Anemia work up: ?No results for input(s): VITAMINB12, FOLATE, FERRITIN, TIBC, IRON, RETICCTPCT in the last 72 hours. ?Sepsis Labs: ?Recent Labs  ?Lab 09/25/21 ?1812  ?WBC 16.0*  ? ? ?Microbiology ?No results found for this or any previous visit (from the past 240 hour(s)). ? ?Procedures and diagnostic studies: ? ?No results found. ? ? ? ? ? ? ? ? ? ? ? ? LOS: 0 days  ? ?[name]  ?[facility]  ? ?Pager  on [email or web address]. If 7PM-7AM, please contact night-coverage at [email or web address] ? ? ? ? ?09/26/2021, 2:57 PM  ? ? ? ? ? ? ? ? ? ?

## 2021-09-26 NOTE — [place] FL2 (Signed)
?[place] MEDICAID FL2 LEVEL OF CARE SCREENING TOOL  ?  ? ?IDENTIFICATION  ?Patient Name: ?Amy Hensley Birthdate: 1932/10/22 Sex: female Admission Date (Current Location): ?09/25/2021  ?[place] and [place] Number: ? [place] ?  Facility and Address:  ?[facility], [address], [place], [place] [address] ?     Provider Number: ?[id number]  ?Attending Physician Name and Address:  ?Jennye Boroughs, MD ? Relative Name and Phone Number:  ?Barnabas Lister spouse [phone] ?   ?Current Level of Care: ?Hospital Recommended Level of Care: ?[facility] Prior Approval Number: ?  ? ?Date Approved/Denied: ?  PASRR Number: ?[id number] A ? ?Discharge Plan: ?SNF ?  ? ?Current Diagnoses: ?Patient Active Problem List  ? Diagnosis Date Noted  ? Intractable back pain 09/25/2021  ? Lumbar radiculopathy 09/25/2021  ? Ambulatory dysfunction 09/25/2021  ? Stage 3b chronic kidney disease ([place]) 09/25/2021  ? History of fall 08/2021 with sacral insufficiency fractures 09/25/2021  ? Long term (current) use of systemic steroids 09/25/2021  ? Hypertension   ? Hypothyroidism   ? Hepatitis C   ? Leukocytosis   ? GCA (giant cell arteritis) ([place]) 06/21/2021  ? Hepatitis C antibody positive in blood 06/01/2021  ? ? ?Orientation RESPIRATION BLADDER Height & Weight   ?  ?Self, Time, Situation, Place ? Normal Continent Weight: 59.9 kg ?Height:  5' 3.5" (161.3 cm)  ?BEHAVIORAL SYMPTOMS/MOOD NEUROLOGICAL BOWEL NUTRITION STATUS  ?    Continent Diet (regular)  ?AMBULATORY STATUS COMMUNICATION OF NEEDS Skin   ?Extensive Assist Verbally Normal ?  ?  ?  ?    ?     ?     ? ? ?Personal Care Assistance Level of Assistance  ?Bathing, Dressing Bathing Assistance: Limited assistance ?  ?Dressing Assistance: Limited assistance ?   ? ?Functional Limitations Info  ?    ?  ?   ? ? ?SPECIAL CARE FACTORS FREQUENCY  ?PT (By licensed PT), OT (By licensed OT)   ?  ?PT Frequency: 5 times per week ?OT Frequency: 5 times per week ?  ?  ?  ?    ? ? ?Contractures Contractures Info: Not present  ? ? ?Additional Factors Info  ?Code Status, Allergies Code Status Info: full code ?Allergies Info: Alendronate, Buspirone ?  ?  ?  ?   ? ?Current Medications (09/26/2021):  This is the current hospital active medication list ?Current Facility-Administered Medications  ?Medication Dose Route Frequency Provider Last Rate Last Admin  ? acetaminophen (TYLENOL) tablet 650 mg  650 mg Oral Q6H PRN Athena Masse, MD      ? Or  ? acetaminophen (TYLENOL) suppository 650 mg  650 mg Rectal Q6H PRN Athena Masse, MD      ? amLODipine (NORVASC) tablet 10 mg  10 mg Oral QHS Athena Masse, MD   10 mg at 09/25/21 2249  ? enoxaparin (LOVENOX) injection 30 mg  30 mg Subcutaneous Q24H Athena Masse, MD   30 mg at 09/25/21 2055  ? levothyroxine (SYNTHROID) tablet 50 mcg  50 mcg Oral QAC breakfast Athena Masse, MD   50 mcg at 09/26/21 0538  ? ondansetron (ZOFRAN) tablet 4 mg  4 mg Oral Q6H PRN Athena Masse, MD      ? Or  ? ondansetron [facility]) injection 4 mg  4 mg Intravenous Q6H PRN Athena Masse, MD      ? Derrill Memo ON 09/27/2021] predniSONE (DELTASONE) tablet 25 mg  25 mg Oral Q breakfast  Jennye Boroughs, MD      ? ? ? ?Discharge Medications: ?Please see discharge summary for a list of discharge medications. ? ?Relevant Imaging Results: ? ?Relevant Lab Results: ? ? ?Additional Information ?SS# [id number] ? ?Conception Oms, RN ? ? ? ? ?

## 2021-09-26 NOTE — Evaluation (Signed)
Physical Therapy Evaluation ?Patient Details ?Name: Amy Hensley ?MRN: [id number] ?DOB: 1932/12/15 ?Today's Date: 09/26/2021 ? ?History of Present Illness ? Pt is an 88 y.o. female with medical history significant for HTN, CKD 3, hep C, giant cell arteritis, lumbar radiculopathy, history of fall a month prior with sacral insufficiency fractures, who comes with worsening of her chronic low back pain with radiation down into her right lower extremity.  MD assessment includes: Intractable back pain, abmulatory dysfunction, leukocytosis, and lumbar radiculopathy. ?  ?Clinical Impression ? Pt was pleasant and motivated to participate during the session and put forth good effort throughout. Pt required physical assistance along with extra time with log roll training and to come to standing from an elevated EOB.  Once in standing pt could ambulate a max of 8 feet with step-to sequencing to assist with RLE pain control.  Pt's SpO2 and HR were WNL on room air with no other adverse symptoms noted other than pain.  Pt has limited assistance at home and is at an elevated risk for falls and further functional decline if she were to attempt to return to her prior living situation.  Pt will benefit from PT services in a SNF setting upon discharge to safely address deficits listed in patient problem list for decreased caregiver assistance and eventual return to PLOF. ? ?   ?   ? ?Recommendations for follow up therapy are one component of a multi-disciplinary discharge planning process, led by the attending physician.  Recommendations may be updated based on patient status, additional functional criteria and insurance authorization. ? ?Follow Up Recommendations Skilled nursing-short term rehab (<3 hours/day) ? ?  ?Assistance Recommended at Discharge Frequent or constant Supervision/Assistance  ?Patient can return home with the following ? A lot of help with walking and/or transfers;A little help with bathing/dressing/bathroom;Help  with stairs or ramp for entrance;Assist for transportation ? ?  ?Equipment Recommendations Rolling walker (2 wheels);BSC/3in1  ?Recommendations for Other Services ?    ?  ?Functional Status Assessment Patient has had a recent decline in their functional status and demonstrates the ability to make significant improvements in function in a reasonable and predictable amount of time.  ? ?  ?Precautions / Restrictions Precautions ?Precautions: Fall ?Restrictions ?Weight Bearing Restrictions: No  ? ?  ? ?Mobility ? Bed Mobility ?Overal bed mobility: Needs Assistance ?Bed Mobility: Rolling, Sidelying to Sit ?Rolling: Min assist ?Sidelying to sit: Mod assist ?  ?  ?  ?General bed mobility comments: Min to mod A for BLE and trunk control; log roll training provided for pain control  ?  ? ?Transfers ?Overall transfer level: Needs assistance ?Equipment used: Rolling walker (2 wheels) ?Transfers: Sit to/from Stand ?Sit to Stand: From elevated surface, Min assist ?  ?  ?  ?  ?  ?General transfer comment: Min verbal cues for hand placement and min A to come to full standing ?  ? ?Ambulation/Gait ?Ambulation/Gait assistance: Min guard ?Gait Distance (Feet): 8 Feet ?Assistive device: Rolling walker (2 wheels) ?Gait Pattern/deviations: Step-to pattern, Decreased stance time - right ?Gait velocity: decreased ?  ?  ?General Gait Details: Mod verbal and visual cues for step-to sequencing for RLE pain control; pt steady without LOB ? ?Stairs ?  ?  ?  ?  ?  ? ?Wheelchair Mobility ?  ? ?Modified Rankin (Stroke Patients Only) ?  ? ?  ? ?Balance Overall balance assessment: Needs assistance ?  ?Sitting balance-Leahy Scale: Good ?  ?  ?Standing balance support: Bilateral upper  extremity supported, During functional activity ?Standing balance-Leahy Scale: Fair ?  ?  ?  ?  ?  ?  ?  ?  ?  ?  ?  ?  ?   ? ? ? ?Pertinent Vitals/Pain Pain Assessment ?Pain Assessment: 0-10 ?Pain Score: 5  ?Pain Location: low back and RLE ?Pain Descriptors /  Indicators: Sore, Aching ?Pain Intervention(s): Repositioned, Premedicated before session, Monitored during session  ? ? ?Home Living Family/patient expects to be discharged to:: Private residence ?Living Arrangements: Spouse/significant other ?Available Help at Discharge: Family;Available 24 hours/day ?Type of Home: House ?Home Access: Stairs to enter ?Entrance Stairs-Rails: Left ?Entrance Stairs-Number of Steps: 3 ?  ?Home Layout: One level ?Home Equipment: Air cabin crew (4 wheels);Cane - single point;Grab bars - tub/shower ?Additional Comments: Pt lives with spouse who has medical issues, ambulates with a rollator, and stated is not able to provide signifiant physical assist to patient  ?  ?Prior Function Prior Level of Function : Needs assist ?  ?  ?  ?  ?  ?  ?Mobility Comments: Mod Ind amb limited community distances with either a rollator or SPC, one fall in the last 6 months secondary to tripping ?ADLs Comments: Sponge bathes, spouse assists with ADLs as needed ?  ? ? ?Hand Dominance  ?   ? ?  ?Extremity/Trunk Assessment  ? Upper Extremity Assessment ?Upper Extremity Assessment: Generalized weakness ?  ? ?Lower Extremity Assessment ?Lower Extremity Assessment: Generalized weakness ?  ? ?   ?Communication  ? Communication: No difficulties  ?Cognition Arousal/Alertness: Awake/alert ?Behavior During Therapy: [facility] for tasks assessed/performed ?Overall Cognitive Status: Within Functional Limits for tasks assessed ?  ?  ?  ?  ?  ?  ?  ?  ?  ?  ?  ?  ?  ?  ?  ?  ?  ?  ?  ? ?  ?General Comments   ? ?  ?Exercises Total Joint Exercises ?Ankle Circles/Pumps: AROM, Strengthening, Both, 10 reps, 5 reps ?Quad Sets: Strengthening, Both, 10 reps, 5 reps ?Gluteal Sets: Strengthening, Both, 5 reps, 10 reps ?Heel Slides: Strengthening, Both, 5 reps ?Long [employer]: Strengthening, Both, 10 reps ?Knee Flexion: Strengthening, Both, 10 reps ?Marching in Standing: Strengthening, Both, 5 reps, Standing ?Other Exercises ?Other  Exercises: HEP education for BLE APs, QS, LAQ, and GS  ? ?Assessment/Plan  ?  ?PT Assessment Patient needs continued PT services  ?PT Problem List Decreased strength;Decreased mobility;Decreased balance;Decreased knowledge of use of DME;Pain ? ?   ?  ?PT Treatment Interventions DME instruction;Gait training;Stair training;Functional mobility training;Therapeutic activities;Therapeutic exercise;Balance training;Patient/family education   ? ?PT Goals (Current goals can be found in the Care Plan section)  ?Acute Rehab PT Goals ?Patient Stated Goal: To walk better without pain ?PT Goal Formulation: With patient ?Time For Goal Achievement: 10/09/21 ?Potential to Achieve Goals: Good ? ?  ?Frequency Min 2X/week ?  ? ? ?Co-evaluation   ?  ?  ?  ?  ? ? ?  ?AM-PAC PT "6 Clicks" Mobility  ?Outcome Measure Help needed turning from your back to your side while in a flat bed without using bedrails?: A Little ?Help needed moving from lying on your back to sitting on the side of a flat bed without using bedrails?: A Lot ?Help needed moving to and from a bed to a chair (including a wheelchair)?: A Lot ?Help needed standing up from a chair using your arms (e.g., wheelchair or bedside chair)?: A Little ?Help needed to walk  in hospital room?: A Lot ?Help needed climbing 3-5 steps with a railing? : Total ?6 Click Score: 13 ? ?  ?End of Session Equipment Utilized During Treatment: Gait belt ?Activity Tolerance: Patient tolerated treatment well ?Patient left: in chair;with call bell/phone within reach;with chair alarm set;with family/visitor present ?Nurse Communication: Mobility status ?PT Visit Diagnosis: History of falling (Z91.81);Difficulty in walking, not elsewhere classified (R26.2);Muscle weakness (generalized) (M62.81);Pain ?Pain - Right/Left: Right ?Pain - part of body: Leg (low back) ?  ? ?Time: [id number] ?PT Time Calculation (min) (ACUTE ONLY): 36 min ? ? ?Charges:   PT Evaluation ?$PT Eval Moderate Complexity: 1 Mod ?PT  Treatments ?$Therapeutic Exercise: 8-22 mins ?  ?   ? ? ?D. Royetta Asal PT, DPT ?09/26/21, 11:16 AM ? ? ?

## 2021-09-27 DIAGNOSIS — M549 Dorsalgia, unspecified: Secondary | ICD-10-CM | POA: Diagnosis not present

## 2021-09-27 DIAGNOSIS — M5116 Intervertebral disc disorders with radiculopathy, lumbar region: Secondary | ICD-10-CM | POA: Diagnosis present

## 2021-09-27 DIAGNOSIS — I129 Hypertensive chronic kidney disease with stage 1 through stage 4 chronic kidney disease, or unspecified chronic kidney disease: Secondary | ICD-10-CM | POA: Diagnosis present

## 2021-09-27 DIAGNOSIS — B1920 Unspecified viral hepatitis C without hepatic coma: Secondary | ICD-10-CM | POA: Diagnosis present

## 2021-09-27 DIAGNOSIS — M5416 Radiculopathy, lumbar region: Secondary | ICD-10-CM | POA: Diagnosis present

## 2021-09-27 DIAGNOSIS — Z7952 Long term (current) use of systemic steroids: Secondary | ICD-10-CM | POA: Diagnosis not present

## 2021-09-27 DIAGNOSIS — N1832 Chronic kidney disease, stage 3b: Secondary | ICD-10-CM | POA: Diagnosis present

## 2021-09-27 DIAGNOSIS — M8448XD Pathological fracture, other site, subsequent encounter for fracture with routine healing: Secondary | ICD-10-CM | POA: Diagnosis present

## 2021-09-27 DIAGNOSIS — D72829 Elevated white blood cell count, unspecified: Secondary | ICD-10-CM | POA: Diagnosis present

## 2021-09-27 DIAGNOSIS — E039 Hypothyroidism, unspecified: Secondary | ICD-10-CM | POA: Diagnosis present

## 2021-09-27 DIAGNOSIS — M316 Other giant cell arteritis: Secondary | ICD-10-CM | POA: Diagnosis not present

## 2021-09-27 DIAGNOSIS — R262 Difficulty in walking, not elsewhere classified: Secondary | ICD-10-CM | POA: Diagnosis not present

## 2021-09-27 DIAGNOSIS — G8929 Other chronic pain: Secondary | ICD-10-CM | POA: Diagnosis present

## 2021-09-27 DIAGNOSIS — Z87891 Personal history of nicotine dependence: Secondary | ICD-10-CM | POA: Diagnosis not present

## 2021-09-27 DIAGNOSIS — E871 Hypo-osmolality and hyponatremia: Secondary | ICD-10-CM | POA: Diagnosis present

## 2021-09-27 DIAGNOSIS — T380X5A Adverse effect of glucocorticoids and synthetic analogues, initial encounter: Secondary | ICD-10-CM | POA: Diagnosis present

## 2021-09-27 LAB — CBC
HCT: 38.3 % (ref 36.0–46.0)
Hemoglobin: 12.7 g/dL (ref 12.0–15.0)
MCH: 30.6 pg (ref 26.0–34.0)
MCHC: 33.2 g/dL (ref 30.0–36.0)
MCV: 92.3 fL (ref 80.0–100.0)
Platelets: 310 10*3/uL (ref 150–400)
RBC: 4.15 MIL/uL (ref 3.87–5.11)
RDW: 14 % (ref 11.5–15.5)
WBC: 17.1 10*3/uL — ABNORMAL HIGH (ref 4.0–10.5)
nRBC: 0 % (ref 0.0–0.2)

## 2021-09-27 LAB — BASIC METABOLIC PANEL
Anion gap: 12 (ref 5–15)
BUN: 40 mg/dL — ABNORMAL HIGH (ref 8–23)
CO2: 24 mmol/L (ref 22–32)
Calcium: 9.5 mg/dL (ref 8.9–10.3)
Chloride: 99 mmol/L (ref 98–111)
Creatinine, Ser: 1.63 mg/dL — ABNORMAL HIGH (ref 0.44–1.00)
GFR, Estimated: 30 mL/min — ABNORMAL LOW (ref >60)
Glucose, Bld: 134 mg/dL — ABNORMAL HIGH (ref 70–99)
Potassium: 3.7 mmol/L (ref 3.5–5.1)
Sodium: 135 mmol/L (ref 135–145)

## 2021-09-27 NOTE — Plan of Care (Signed)

## 2021-09-27 NOTE — Progress Notes (Signed)
PT Cancellation Note ? ?Patient Details ?Name: Amy Hensley ?MRN: [id number] ?DOB: Apr 11, 1933 ? ? ?Cancelled Treatment:     PT attempt, pt in BR having BM. She requested female assist her with hygiene care afterwards. Acute PT will continue to follow and progress as able per current POC.  ? ? ?Willette Pa ?09/27/2021, 3:44 PM ?

## 2021-09-27 NOTE — TOC Progression Note (Signed)
Transition of Care (TOC) - Progression Note  ? ? ?Patient Details  ?Name: Amy Hensley ?MRN: [id number] ?Date of Birth: [birth year] ? ?Transition of Care (TOC) CM/SW Contact  ?Conception Oms, RN ?Phone Number: ?09/27/2021, 4:15 PM ? ?Clinical Narrative:    ? ?Ins approved to go to SNF, Auth number [id number], [place] [id number] ? ?  ?  ? ?Expected Discharge Plan and Services ?  ?  ?  ?  ?  ?                ?  ?  ?  ?  ?  ?  ?  ?  ?  ?  ? ? ?Social Determinants of Health (SDOH) Interventions ?  ? ?Readmission Risk Interventions ?   ? View : No data to display.  ?  ?  ?  ? ? ?

## 2021-09-27 NOTE — Progress Notes (Addendum)
? ? ? ?Progress Note  ? ? ?Amy Hensley  DDU:[id number] DOB: 07-Sep-1932  DOA: 09/25/2021 ?PCP: Adin Hector, MD  ? ? ? ? ?Brief Narrative:  ? ? ?Medical records reviewed and are as summarized below: ? ?Amy Hensley is a 88 y.o. female with medical history significant for HTN, hep C, giant cell arteritis on long-term prednisone therapy, lumbar radiculopathy followed by orthopedist, history of fall a month prior with sacral insufficiency fractures. She noticed worsening of her back pain when attempt was made to taper down her prednisone.  She saw her orthopedic surgeon on 09/19/2021 and was referred to see a physiatrist for consideration of epidural steroid injection. She presented to the hospital because of acute on chronic low back pain with radiation down her right lower extremity and difficulty ambulating.  ? ?She was admitted to the hospital for pain control.  She underwent epidural spinal injection with Depo-Medrol mixed with lidocaine which has helped with her pain.  She was evaluated by PT and OT who recommended further rehabilitation at a skilled nursing facility. ? ? ? ? ?Assessment/Plan:  ? ?Principal Problem: ?  Intractable back pain ?Active Problems: ?  Ambulatory dysfunction ?  Lumbar radiculopathy ?  History of fall 08/2021 with sacral insufficiency fractures ?  Leukocytosis ?  GCA (giant cell arteritis) ([place]) ?  Long term (current) use of systemic steroids ?  Stage 3b chronic kidney disease ([place]) ?  Hypertension ?  Hypothyroidism ?  Hepatitis C ? ? ? ?Body mass index is 23.03 kg/m?. ? ? ?Severe low back pain, ambulatory dysfunction, sacral insufficiency fractures, lumbar degenerative disc disease, recent fall: S/p epidural spinal injection of Depo-Medrol mixed with 1% lidocaine on 09/26/2021.  Continue analgesics as needed. ? ?Giant cell arteritis: Continue prednisone ? ?Leukocytosis is likely from chronic prednisone therapy ? ?Other comorbidities include chronic hyponatremia, hypothyroidism,  hypertension, hepatitis C (completed a 12-week course of Epclusa in May 2023), CKD stage IIIa ? ? ? ?Diet Order   ? ?       ?  Diet Heart Room service appropriate? Yes; Fluid consistency: Thin  Diet effective now       ?  ? ?  ?  ? ?  ? ? ? ? ? ? ? ? ? ? ? ?Consultants: ?Interventional radiologist, orthopedic surgeon ? ?Procedures: ?None ? ? ? ?Medications:  ? ? amLODipine  10 mg Oral QHS  ? enoxaparin (LOVENOX) injection  30 mg Subcutaneous Q24H  ? levothyroxine  50 mcg Oral QAC breakfast  ? predniSONE  25 mg Oral Q breakfast  ? ?Continuous Infusions: ? ? ?Anti-infectives (From admission, onward)  ? ? Start     Dose/Rate Route Frequency Ordered Stop  ? 09/25/21 2045  Sofosbuvir-Velpatasvir 400-100 MG TABS 1 tablet  Status:  Discontinued       ? 1 tablet Oral Daily 09/25/21 2042 09/25/21 2107  ? ?  ? ? ? ? ? ? ? ? ? ?Family Communication/Anticipated D/C date and plan/Code Status  ? ?DVT prophylaxis: enoxaparin (LOVENOX) injection 30 mg Start: 09/25/21 2030 ? ? ?  Code Status: Full Code ? ?Family Communication: Plan discussed with her husband at the bedside ?Disposition Plan: Plan to discharge to SNF ? ? ?Status is: Inpatient ?Remains inpatient appropriate because: Awaiting placement to SNF ? ? ? ? ? ? ? ? ?Subjective:  ? ?Interval events noted.  She complains of low back pain but is better than it was yesterday.  Her husband was at  the bedside. ? ?Objective:  ? ? ?Vitals:  ? 09/26/21 2312 09/27/21 0439 09/27/21 0803 09/27/21 1208  ?BP: 127/75 124/65 126/77 135/70  ?Pulse: 79 80 85 85  ?Resp: '15 20 16 17  '$ ?Temp: 97.7 ?F (36.5 ?C) 98.7 ?F (37.1 ?C) 98.1 ?F (36.7 ?C) 98.2 ?F (36.8 ?C)  ?TempSrc:   Oral   ?SpO2: 95% 95% 96% 95%  ?Weight:      ?Height:      ? ?No data found. ? ? ?Intake/Output Summary (Last 24 hours) at 09/27/2021 1430 ?Last data filed at 09/27/2021 1400 ?Gross per 24 hour  ?Intake 440 ml  ?Output 2 ml  ?Net 438 ml  ? ?Filed Weights  ? 09/25/21 1715  ?Weight: 59.9 kg  ? ? ?Exam: ? ?GEN: NAD ?SKIN: No rash  on exposed skin ?EYES: EOMI ?ENT: MMM ?CV: RRR ?PULM: CTA B ?ABD: soft, ND, NT, +BS ?CNS: AAO x 3, non focal ?EXT: No edema or tenderness ?MSK: Sacral spinal and paraspinal tenderness ? ? ? ?  ? ? ?Data Reviewed:  ? ?I have personally reviewed following labs and imaging studies: ? ?Labs: ?Labs show the following:  ? ?Basic Metabolic Panel: ?Recent Labs  ?Lab 09/25/21 ?1812 09/27/21 ?9983  ?NA 132* 135  ?K 4.3 3.7  ?CL 95* 99  ?CO2 25 24  ?GLUCOSE 135* 134*  ?BUN 33* 40*  ?CREATININE 1.54* 1.63*  ?CALCIUM 10.0 9.5  ? ?GFR ?Estimated Creatinine Clearance: 20.6 mL/min (A) (by C-G formula based on SCr of 1.63 mg/dL (H)). ?Liver Function Tests: ?No results for input(s): AST, ALT, ALKPHOS, BILITOT, PROT, ALBUMIN in the last 168 hours. ?No results for input(s): LIPASE, AMYLASE in the last 168 hours. ?No results for input(s): AMMONIA in the last 168 hours. ?Coagulation profile ?No results for input(s): INR, PROTIME in the last 168 hours. ? ?CBC: ?Recent Labs  ?Lab 09/25/21 ?1812 09/27/21 ?3825  ?WBC 16.0* 17.1*  ?NEUTROABS 14.5*  --   ?HGB 13.9 12.7  ?HCT 41.4 38.3  ?MCV 93.0 92.3  ?PLT 346 310  ? ?Cardiac Enzymes: ?No results for input(s): CKTOTAL, CKMB, CKMBINDEX, TROPONINI in the last 168 hours. ?BNP (last 3 results) ?No results for input(s): PROBNP in the last 8760 hours. ?CBG: ?No results for input(s): GLUCAP in the last 168 hours. ?D-Dimer: ?No results for input(s): DDIMER in the last 72 hours. ?Hgb A1c: ?No results for input(s): HGBA1C in the last 72 hours. ?Lipid Profile: ?No results for input(s): CHOL, HDL, LDLCALC, TRIG, CHOLHDL, LDLDIRECT in the last 72 hours. ?Thyroid function studies: ?No results for input(s): TSH, T4TOTAL, T3FREE, THYROIDAB in the last 72 hours. ? ?Invalid input(s): FREET3 ?Anemia work up: ?No results for input(s): VITAMINB12, FOLATE, FERRITIN, TIBC, IRON, RETICCTPCT in the last 72 hours. ?Sepsis Labs: ?Recent Labs  ?Lab 09/25/21 ?1812 09/27/21 ?0539  ?WBC 16.0* 17.1*  ? ? ?Microbiology ?No  results found for this or any previous visit (from the past 240 hour(s)). ? ?Procedures and diagnostic studies: ? ?IR INJECT DIAG/THERA/INC NEEDLE/CATH/PLC EPI/LUMB/SAC W/IMG ? ?Result Date: 09/26/2021 ?CLINICAL DATA:  88 year old female with lumbosacral spondylosis without myelopathy. She has a significant right L3 radiculopathy which has been recalcitrant to conservative management. She presents for right L3 nerve root block and transforaminal epidural steroid injection. EXAM: IR INJECT/THERA/INC NEEDLE/CATH/PLC EPI/LUMB/SAC W/IMG FLUOROSCOPY TIME:  Radiation Exposure Index (as provided by the fluoroscopic device): 19.6 mGy [place] PROCEDURE: The procedure, risks, benefits, and alternatives were explained to the patient. Questions regarding the procedure were encouraged and answered. The patient understands  and consents to the procedure. RIGHT L3 NERVE ROOT BLOCK AND TRANSFORAMINAL EPIDURAL: A posterior oblique approach was taken to the intervertebral foramen on the right at L3 using a curved 22 gauge spinal needle. Injection of Omnipaque 180 outlined the L3 nerve root and showed good epidural spread. No vascular opacification is seen. Eighty mg of Depo-Medrol mixed with 2 mL 1% lidocaine were instilled. The procedure was well-tolerated, and the patient was discharged thirty minutes following the injection in good condition. COMPLICATIONS: None IMPRESSION: Technically successful injection consisting of a right L3 nerve root block and transforaminal epidural. Electronically Signed   By: Jacqulynn Cadet M.D.   On: 09/26/2021 16:43   ? ? ? ? ? ? ? ? ? ? ? ? LOS: 0 days  ? ?Deral Schellenberg  ?[facility]  ? ?Pager on [email or web address]. If 7PM-7AM, please contact night-coverage at [email or web address] ? ? ? ? ?09/27/2021, 2:30 PM  ? ? ? ? ? ? ? ? ? ?

## 2021-09-27 NOTE — TOC Progression Note (Signed)
Transition of Care (TOC) - Progression Note  ? ? ?Patient Details  ?Name: Amy Hensley ?MRN: [id number] ?Date of Birth: 1932/12/10 ? ?Transition of Care (TOC) CM/SW Contact  ?Conception Oms, RN ?Phone Number: ?09/27/2021, 11:28 AM ? ?Clinical Narrative:    ?Reviewed the bed offers with the patient and her Husband as well as Medicare Star rating, they chose [facility] I notified Tam[facility] at [facility], I called [facility] and spoke to [facility] and requested the Ins approval both for SNF and EMS ? ? ?  ?  ? ?Expected Discharge Plan and Services ?  ?  ?  ?  ?  ?                ?  ?  ?  ?  ?  ?  ?  ?  ?  ?  ? ? ?Social Determinants of Health (SDOH) Interventions ?  ? ?Readmission Risk Interventions ?   ? View : No data to display.  ?  ?  ?  ? ? ?

## 2021-09-28 DIAGNOSIS — N390 Urinary tract infection, site not specified: Secondary | ICD-10-CM | POA: Diagnosis not present

## 2021-09-28 DIAGNOSIS — I10 Essential (primary) hypertension: Secondary | ICD-10-CM | POA: Diagnosis not present

## 2021-09-28 DIAGNOSIS — R2681 Unsteadiness on feet: Secondary | ICD-10-CM | POA: Diagnosis not present

## 2021-09-28 DIAGNOSIS — M5416 Radiculopathy, lumbar region: Secondary | ICD-10-CM | POA: Diagnosis not present

## 2021-09-28 DIAGNOSIS — M8008XG Age-related osteoporosis with current pathological fracture, vertebra(e), subsequent encounter for fracture with delayed healing: Secondary | ICD-10-CM | POA: Diagnosis not present

## 2021-09-28 DIAGNOSIS — M316 Other giant cell arteritis: Secondary | ICD-10-CM | POA: Diagnosis not present

## 2021-09-28 DIAGNOSIS — I509 Heart failure, unspecified: Secondary | ICD-10-CM | POA: Diagnosis not present

## 2021-09-28 DIAGNOSIS — S3210XS Unspecified fracture of sacrum, sequela: Secondary | ICD-10-CM | POA: Diagnosis not present

## 2021-09-28 DIAGNOSIS — E569 Vitamin deficiency, unspecified: Secondary | ICD-10-CM | POA: Diagnosis not present

## 2021-09-28 DIAGNOSIS — E559 Vitamin D deficiency, unspecified: Secondary | ICD-10-CM | POA: Diagnosis not present

## 2021-09-28 DIAGNOSIS — M6281 Muscle weakness (generalized): Secondary | ICD-10-CM | POA: Diagnosis not present

## 2021-09-28 DIAGNOSIS — Z9181 History of falling: Secondary | ICD-10-CM | POA: Diagnosis not present

## 2021-09-28 DIAGNOSIS — M549 Dorsalgia, unspecified: Secondary | ICD-10-CM | POA: Diagnosis not present

## 2021-09-28 DIAGNOSIS — Z79899 Other long term (current) drug therapy: Secondary | ICD-10-CM | POA: Diagnosis not present

## 2021-09-28 DIAGNOSIS — E0590 Thyrotoxicosis, unspecified without thyrotoxic crisis or storm: Secondary | ICD-10-CM | POA: Diagnosis not present

## 2021-09-28 DIAGNOSIS — R600 Localized edema: Secondary | ICD-10-CM | POA: Diagnosis not present

## 2021-09-28 DIAGNOSIS — B182 Chronic viral hepatitis C: Secondary | ICD-10-CM | POA: Diagnosis not present

## 2021-09-28 DIAGNOSIS — E039 Hypothyroidism, unspecified: Secondary | ICD-10-CM | POA: Diagnosis not present

## 2021-09-28 DIAGNOSIS — N1832 Chronic kidney disease, stage 3b: Secondary | ICD-10-CM | POA: Diagnosis not present

## 2021-09-28 DIAGNOSIS — M6259 Muscle wasting and atrophy, not elsewhere classified, multiple sites: Secondary | ICD-10-CM | POA: Diagnosis not present

## 2021-09-28 MED ORDER — OXYCODONE HCL 5 MG PO TABS: 5.0000 mg | ORAL_TABLET | Freq: Three times a day (TID) | ORAL | 0 refills | Status: DC | PRN

## 2021-09-28 MED ORDER — PREDNISONE 50 MG PO TABS: 25.0000 mg | ORAL_TABLET | Freq: Every day | ORAL | Status: DC

## 2021-09-28 NOTE — Discharge Summary (Signed)
Physician Discharge Summary  Amy Hensley QIO:[id number] DOB: Sep 20, 1932 DOA: 09/25/2021  PCP: Adin Hector, MD  Admit date: 09/25/2021 Discharge date: 09/28/2021 Recommendations for Outpatient Follow-up:  Follow up with PCP in 1 weeks-call for appointment Please obtain BMP/CBC in one week Follow-up with Dr. Posey Pronto to discuss about weaning down on prednisone  Discharge Dispo: snf Discharge Condition: Stable Code Status:   Code Status: Full Code Diet recommendation:  Diet Order             Diet Heart Room service appropriate? Yes; Fluid consistency: Thin  Diet effective now                    Brief/Interim Summary: 88 y.o. female with medical history significant for HTN, hep C, giant cell arteritis on long-term prednisone therapy, lumbar radiculopathy followed by orthopedist, history of fall a month prior with sacral insufficiency fractures. She noticed worsening of her back pain when attempt was made to taper down her prednisone.  She saw her orthopedic surgeon on 09/19/2021 and was referred to see a physiatrist for consideration of epidural steroid injection. She presented to the hospital because of acute on chronic low back pain with radiation down her right lower extremity and difficulty ambulating. She was admitted to the hospital for pain control.  She underwent epidural spinal injection with Depo-Medrol mixed with lidocaine which has helped with her pain.  She was evaluated by PT and OT who recommended further rehabilitation at a skilled nursing facility.  At this time she remains medically stable discharge to skilled nursing facility awaiting on placement.   Discharge Diagnoses:  Principal Problem:   Intractable back pain Active Problems:   Ambulatory dysfunction   Lumbar radiculopathy   History of fall 08/2021 with sacral insufficiency fractures   Leukocytosis   GCA (giant cell arteritis) ([place])   Long term (current) use of systemic steroids   Stage 3b chronic kidney  disease (HCC)   Hypertension   Hypothyroidism   Hepatitis C  Intractable back pain Ambulatory dysfunction Sacral insufficiency fractures lumbar degenerative disc disease recent fall:  S/p epidural spinal injection of Depo-Medrol mixed with 1% lidocaine on 09/26/2021.  Pain is controlled, continue opiate/nonopioids as tolerated.  She is on chronic prednisone at home, taper to home dose. Currently on 25 mg prednisone- she needs to follow-up with Dr. Posey Pronto to continue to wean down-instructed patient and her husband to call the office after discharge.  Leukocytosis:Suspect related to the steroids  GCA (giant cell arteritis) ([place]):On long-term prednisone therapy,currently on a long taper  Hepatitis C:Completed a 12-week course of Epclusa from February to May 2023  Hypothyroidism: Continue levothyroxine  Hypertension: stable on home amlodipine  Stage 3b chronic kidney disease: Stable at 1.5-1.6. Recent Labs  Lab 09/25/21 1812 09/27/21 0926  BUN 33* 40*  CREATININE 1.54* 1.63*    Consults: IR Subjective: Resting comfortably on the bedside chair, pain is controlled.  Feels ready for discharge to skilled nursing facility.  Husband at the bedside.  Discharge Exam: Vitals:   09/28/21 0618 09/28/21 0757  BP: (!) 142/78 (!) 149/84  Pulse: 76 82  Resp: 18 17  Temp: 97.9 F (36.6 C) 98 F (36.7 C)  SpO2: 97% 96%   General: Pt is alert, awake, not in acute distress Cardiovascular: RRR, S1/S2 +, no rubs, no gallops Respiratory: CTA bilaterally, no wheezing, no rhonchi Abdominal: Soft, NT, ND, bowel sounds + Extremities: no edema, no cyanosis  Discharge Instructions  Discharge Instructions  Discharge instructions   Complete by: As directed    Please follow up with Dr Posey Pronto about weanign down on prednisone- currently on 25 mg daily  Please call call MD or return to ER for similar or worsening recurring problem that brought you to hospital or if any  fever,nausea/vomiting,abdominal pain, uncontrolled pain, chest pain,  shortness of breath or any other alarming symptoms.  Please follow-up your doctor as instructed in a week time and call the office for appointment.  Please avoid alcohol, smoking, or any other illicit substance and maintain healthy habits including taking your regular medications as prescribed.  You were cared for by a hospitalist during your hospital stay. If you have any questions about your discharge medications or the care you received while you were in the hospital after you are discharged, you can call the unit and ask to speak with the hospitalist on call if the hospitalist that took care of you is not available.  Once you are discharged, your primary care physician will handle any further medical issues. Please note that NO REFILLS for any discharge medications will be authorized once you are discharged, as it is imperative that you return to your primary care physician (or establish a relationship with a primary care physician if you do not have one) for your aftercare needs so that they can reassess your need for medications and monitor your lab values   Increase activity slowly   Complete by: As directed       Allergies as of 09/28/2021       Reactions   Alendronate Other (See Comments)   Dyspepsia (indigestion)    Buspirone Other (See Comments)   sedation        Medication List     STOP taking these medications    amoxicillin-clavulanate 875-125 MG tablet Commonly known as: AUGMENTIN   Sofosbuvir-Velpatasvir 400-100 MG Tabs Commonly known as: Epclusa   traMADol 50 MG tablet Commonly known as: ULTRAM   VITAMIN B-12 PO       TAKE these medications    acetaminophen 325 MG tablet Commonly known as: TYLENOL Take 650 mg by mouth every 6 (six) hours as needed.   amLODipine 10 MG tablet Commonly known as: NORVASC Take 10 mg by mouth daily.   Calcium Carb-Cholecalciferol 600-800 MG-UNIT  Tabs Take 1 tablet by mouth daily.   esomeprazole 20 MG capsule Commonly known as: NEXIUM Take 20 mg by mouth every morning.   levothyroxine 50 MCG tablet Commonly known as: SYNTHROID Take 50 mcg by mouth daily before breakfast.   magnesium oxide 400 MG tablet Commonly known as: MAG-OX Take 400 mg by mouth daily.   oxyCODONE 5 MG immediate release tablet Commonly known as: Oxy IR/ROXICODONE Take 1 tablet (5 mg total) by mouth every 8 (eight) hours as needed for up to 10 doses for moderate pain.   predniSONE 50 MG tablet Commonly known as: DELTASONE Take 0.5 tablets (25 mg total) by mouth daily with breakfast. What changed:  medication strength how much to take when to take this additional instructions   VITAMIN D3 PO Take 1 tablet by mouth daily.        Contact information for after-discharge care     Destination     [facility] Preferred SNF .   Service: Skilled Chiropodist information: [address] [place] [phone]                    Allergies  Allergen Reactions   Alendronate Other (See Comments)    Dyspepsia (indigestion)    Buspirone Other (See Comments)    sedation    The results of significant diagnostics from this hospitalization (including imaging, microbiology, ancillary and laboratory) are listed below for reference.    Microbiology: No results found for this or any previous visit (from the past 240 hour(s)).  Procedures/Studies: MR LUMBAR SPINE WO CONTRAST  Result Date: 09/06/2021 CLINICAL DATA:  Pain in the lower back extending into the right leg EXAM: MRI LUMBAR SPINE WITHOUT CONTRAST TECHNIQUE: Multiplanar, multisequence MR imaging of the lumbar spine was performed. No intravenous contrast was administered. COMPARISON:  None. FINDINGS: Segmentation: 5 lumbar type vertebrae based on the available coverage Alignment:  Hyperlordosis.  Levoscoliosis. Vertebrae: Marrow edema around a  vertical partially covered hypointense fracture plane at the sacral ala. A left lateral sacral fracture is also noted on axial T2 weighted imaging, although not covered on sagittal STIR images. Conus medullaris and cauda equina: Conus extends to the T12-L1 level. Conus and cauda equina appear normal. Small sacral Tarlov cysts, considered incidental. Paraspinal and other soft tissues: Prominent atrophy of intrinsic back muscles. Asymmetric severe left renal atrophy. Disc levels: T12- L1: Small central disc protrusion L1-L2: Unremarkable. L2-L3: Disc collapse with disc bulging and endplate ridging. Mild facet spurring. Asymmetric right subarticular recess stenosis and L3 impingement. L3-L4: Disc narrowing and bulging with facet spurring eccentric to the left. Patent canal and foramina L4-L5: Degenerative facet spurring. Disc space narrowing and bulging. No neural compression L5-S1:Incomplete segmentation.  No neural impingement IMPRESSION: 1. Bilateral sacral insufficiency fractures, more completely covered on the right where there is marrow edema from acute or subacute timing. 2. Lumbar spine degeneration with hyperlordosis and mild scoliosis. 3. L2-3 right subarticular recess stenosis impinging on the right L3 nerve root. Electronically Signed   By: Jorje Guild M.D.   On: 09/06/2021 21:04   IR INJECT DIAG/THERA/INC NEEDLE/CATH/PLC EPI/LUMB/SAC W/IMG  Result Date: 09/26/2021 CLINICAL DATA:  88 year old female with lumbosacral spondylosis without myelopathy. She has a significant right L3 radiculopathy which has been recalcitrant to conservative management. She presents for right L3 nerve root block and transforaminal epidural steroid injection. EXAM: IR INJECT/THERA/INC NEEDLE/CATH/PLC EPI/LUMB/SAC W/IMG FLUOROSCOPY TIME:  Radiation Exposure Index (as provided by the fluoroscopic device): 19.6 mGy Kerma PROCEDURE: The procedure, risks, benefits, and alternatives were explained to the patient. Questions  regarding the procedure were encouraged and answered. The patient understands and consents to the procedure. RIGHT L3 NERVE ROOT BLOCK AND TRANSFORAMINAL EPIDURAL: A posterior oblique approach was taken to the intervertebral foramen on the right at L3 using a curved 22 gauge spinal needle. Injection of Omnipaque 180 outlined the L3 nerve root and showed good epidural spread. No vascular opacification is seen. Eighty mg of Depo-Medrol mixed with 2 mL 1% lidocaine were instilled. The procedure was well-tolerated, and the patient was discharged thirty minutes following the injection in good condition. COMPLICATIONS: None IMPRESSION: Technically successful injection consisting of a right L3 nerve root block and transforaminal epidural. Electronically Signed   By: Jacqulynn Cadet M.D.   On: 09/26/2021 16:43    Labs: BNP (last 3 results) No results for input(s): BNP in the last 8760 hours. Basic Metabolic Panel: Recent Labs  Lab 09/25/21 1812 09/27/21 0926  NA 132* 135  K 4.3 3.7  CL 95* 99  CO2 25 24  GLUCOSE 135* 134*  BUN 33* 40*  CREATININE 1.54* 1.63*  CALCIUM 10.0 9.5   Liver Function Tests:  No results for input(s): AST, ALT, ALKPHOS, BILITOT, PROT, ALBUMIN in the last 168 hours. No results for input(s): LIPASE, AMYLASE in the last 168 hours. No results for input(s): AMMONIA in the last 168 hours. CBC: Recent Labs  Lab 09/25/21 1812 09/27/21 0926  WBC 16.0* 17.1*  NEUTROABS 14.5*  --   HGB 13.9 12.7  HCT 41.4 38.3  MCV 93.0 92.3  PLT 346 310   Cardiac Enzymes: No results for input(s): CKTOTAL, CKMB, CKMBINDEX, TROPONINI in the last 168 hours. BNP: Invalid input(s): POCBNP CBG: No results for input(s): GLUCAP in the last 168 hours. D-Dimer No results for input(s): DDIMER in the last 72 hours. Hgb A1c No results for input(s): HGBA1C in the last 72 hours. Lipid Profile No results for input(s): CHOL, HDL, LDLCALC, TRIG, CHOLHDL, LDLDIRECT in the last 72 hours. Thyroid  function studies No results for input(s): TSH, T4TOTAL, T3FREE, THYROIDAB in the last 72 hours.  Invalid input(s): FREET3 Anemia work up No results for input(s): VITAMINB12, FOLATE, FERRITIN, TIBC, IRON, RETICCTPCT in the last 72 hours. Urinalysis    Component Value Date/Time   COLORURINE YELLOW (A) 09/25/2021 2255   APPEARANCEUR CLEAR (A) 09/25/2021 2255   LABSPEC 1.012 09/25/2021 2255   PHURINE 5.0 09/25/2021 2255   GLUCOSEU NEGATIVE 09/25/2021 2255   HGBUR NEGATIVE 09/25/2021 2255   BILIRUBINUR NEGATIVE 09/25/2021 2255   KETONESUR NEGATIVE 09/25/2021 2255   PROTEINUR NEGATIVE 09/25/2021 2255   NITRITE NEGATIVE 09/25/2021 2255   LEUKOCYTESUR NEGATIVE 09/25/2021 2255   Sepsis Labs Invalid input(s): PROCALCITONIN,  WBC,  LACTICIDVEN Microbiology No results found for this or any previous visit (from the past 240 hour(s)).   Time coordinating discharge: 25 minutes  SIGNED: Antonieta Pert, MD  [facility] 09/28/2021, 9:13 AM  If 7PM-7AM, please contact night-coverage [email or web address]

## 2021-09-28 NOTE — Progress Notes (Signed)
Report called to [facility] LPN @ [facility], family transporting pt.

## 2021-09-28 NOTE — TOC Progression Note (Signed)
Transition of Care [facility]) - Progression Note    Patient Details  Name: [name] MRN: [id number] Date of Birth: 08/31/1932  Transition of Care [facility]) CM/SW [place], RN Phone Number: 09/28/2021, 9:46 AM  Clinical Narrative:    Patient is aware of the room number 806 at [facility], Her family will transport her there,         Expected Discharge Plan and Services           Expected Discharge Date: 09/28/21                                     Social Determinants of Health (SDOH) Interventions    Readmission Risk Interventions     View : No data to display.

## 2021-09-28 NOTE — Hospital Course (Signed)
88 y.o. female with medical history significant for HTN, hep C, giant cell arteritis on long-term prednisone therapy, lumbar radiculopathy followed by orthopedist, history of fall a month prior with sacral insufficiency fractures. She noticed worsening of her back pain when attempt was made to taper down her prednisone.  She saw her orthopedic surgeon on 09/19/2021 and was referred to see a physiatrist for consideration of epidural steroid injection. She presented to the hospital because of acute on chronic low back pain with radiation down her right lower extremity and difficulty ambulating. She was admitted to the hospital for pain control.  She underwent epidural spinal injection with Depo-Medrol mixed with lidocaine which has helped with her pain.  She was evaluated by PT and OT who recommended further rehabilitation at a skilled nursing facility.  At this time she remains medically stable discharge to skilled nursing facility awaiting on placement.

## 2021-09-29 DIAGNOSIS — M549 Dorsalgia, unspecified: Secondary | ICD-10-CM | POA: Diagnosis not present

## 2021-09-29 DIAGNOSIS — B182 Chronic viral hepatitis C: Secondary | ICD-10-CM | POA: Diagnosis not present

## 2021-09-29 DIAGNOSIS — M6281 Muscle weakness (generalized): Secondary | ICD-10-CM | POA: Diagnosis not present

## 2021-09-29 DIAGNOSIS — M316 Other giant cell arteritis: Secondary | ICD-10-CM | POA: Diagnosis not present

## 2021-10-03 DIAGNOSIS — M549 Dorsalgia, unspecified: Secondary | ICD-10-CM | POA: Diagnosis not present

## 2021-10-03 DIAGNOSIS — M6281 Muscle weakness (generalized): Secondary | ICD-10-CM | POA: Diagnosis not present

## 2021-10-03 DIAGNOSIS — M8008XG Age-related osteoporosis with current pathological fracture, vertebra(e), subsequent encounter for fracture with delayed healing: Secondary | ICD-10-CM | POA: Diagnosis not present

## 2021-10-03 DIAGNOSIS — Z79899 Other long term (current) drug therapy: Secondary | ICD-10-CM | POA: Diagnosis not present

## 2021-10-03 DIAGNOSIS — M316 Other giant cell arteritis: Secondary | ICD-10-CM | POA: Diagnosis not present

## 2021-10-04 DIAGNOSIS — E559 Vitamin D deficiency, unspecified: Secondary | ICD-10-CM | POA: Diagnosis not present

## 2021-10-04 DIAGNOSIS — E0590 Thyrotoxicosis, unspecified without thyrotoxic crisis or storm: Secondary | ICD-10-CM | POA: Diagnosis not present

## 2021-10-05 DIAGNOSIS — M549 Dorsalgia, unspecified: Secondary | ICD-10-CM | POA: Diagnosis not present

## 2021-10-06 DIAGNOSIS — N390 Urinary tract infection, site not specified: Secondary | ICD-10-CM | POA: Diagnosis not present

## 2021-10-07 DIAGNOSIS — M549 Dorsalgia, unspecified: Secondary | ICD-10-CM | POA: Diagnosis not present

## 2021-10-07 DIAGNOSIS — N390 Urinary tract infection, site not specified: Secondary | ICD-10-CM | POA: Diagnosis not present

## 2021-10-11 DIAGNOSIS — N390 Urinary tract infection, site not specified: Secondary | ICD-10-CM | POA: Diagnosis not present

## 2021-10-11 DIAGNOSIS — M549 Dorsalgia, unspecified: Secondary | ICD-10-CM | POA: Diagnosis not present

## 2021-10-11 DIAGNOSIS — R600 Localized edema: Secondary | ICD-10-CM | POA: Diagnosis not present

## 2021-10-12 DIAGNOSIS — R2681 Unsteadiness on feet: Secondary | ICD-10-CM | POA: Diagnosis not present

## 2021-10-12 DIAGNOSIS — Z9181 History of falling: Secondary | ICD-10-CM | POA: Diagnosis not present

## 2021-10-12 DIAGNOSIS — I509 Heart failure, unspecified: Secondary | ICD-10-CM | POA: Diagnosis not present

## 2021-10-12 DIAGNOSIS — M5416 Radiculopathy, lumbar region: Secondary | ICD-10-CM | POA: Diagnosis not present

## 2021-10-12 DIAGNOSIS — M6259 Muscle wasting and atrophy, not elsewhere classified, multiple sites: Secondary | ICD-10-CM | POA: Diagnosis not present

## 2021-10-12 DIAGNOSIS — S3210XS Unspecified fracture of sacrum, sequela: Secondary | ICD-10-CM | POA: Diagnosis not present

## 2021-10-12 DIAGNOSIS — M316 Other giant cell arteritis: Secondary | ICD-10-CM | POA: Diagnosis not present

## 2021-10-12 DIAGNOSIS — M549 Dorsalgia, unspecified: Secondary | ICD-10-CM | POA: Diagnosis not present

## 2021-10-12 DIAGNOSIS — E039 Hypothyroidism, unspecified: Secondary | ICD-10-CM | POA: Diagnosis not present

## 2021-10-12 DIAGNOSIS — I10 Essential (primary) hypertension: Secondary | ICD-10-CM | POA: Diagnosis not present

## 2021-10-12 DIAGNOSIS — N1832 Chronic kidney disease, stage 3b: Secondary | ICD-10-CM | POA: Diagnosis not present

## 2021-10-12 DIAGNOSIS — E569 Vitamin deficiency, unspecified: Secondary | ICD-10-CM | POA: Diagnosis not present

## 2021-10-12 DIAGNOSIS — M6281 Muscle weakness (generalized): Secondary | ICD-10-CM | POA: Diagnosis not present

## 2021-10-13 DIAGNOSIS — I10 Essential (primary) hypertension: Secondary | ICD-10-CM | POA: Diagnosis not present

## 2021-10-13 DIAGNOSIS — M6281 Muscle weakness (generalized): Secondary | ICD-10-CM | POA: Diagnosis not present

## 2021-10-13 DIAGNOSIS — M549 Dorsalgia, unspecified: Secondary | ICD-10-CM | POA: Diagnosis not present

## 2021-10-13 DIAGNOSIS — M316 Other giant cell arteritis: Secondary | ICD-10-CM | POA: Diagnosis not present

## 2021-10-15 ENCOUNTER — Encounter: Payer: Self-pay | Admitting: Emergency Medicine

## 2021-10-15 ENCOUNTER — Emergency Department: Payer: PPO

## 2021-10-15 ENCOUNTER — Inpatient Hospital Stay
Admission: EM | Admit: 2021-10-15 | Discharge: 2021-10-20 | DRG: 517 | Disposition: A | Discharge status: Skilled Nursing Facility | Payer: PPO | Attending: Internal Medicine | Admitting: Internal Medicine

## 2021-10-15 ENCOUNTER — Other Ambulatory Visit: Payer: Self-pay

## 2021-10-15 DIAGNOSIS — Z87891 Personal history of nicotine dependence: Secondary | ICD-10-CM | POA: Diagnosis not present

## 2021-10-15 DIAGNOSIS — Z888 Allergy status to other drugs, medicaments and biological substances status: Secondary | ICD-10-CM | POA: Diagnosis not present

## 2021-10-15 DIAGNOSIS — Z961 Presence of intraocular lens: Secondary | ICD-10-CM | POA: Diagnosis present

## 2021-10-15 DIAGNOSIS — S32010A Wedge compression fracture of first lumbar vertebra, initial encounter for closed fracture: Secondary | ICD-10-CM | POA: Diagnosis present

## 2021-10-15 DIAGNOSIS — N1832 Chronic kidney disease, stage 3b: Secondary | ICD-10-CM | POA: Diagnosis present

## 2021-10-15 DIAGNOSIS — Z9181 History of falling: Secondary | ICD-10-CM

## 2021-10-15 DIAGNOSIS — M47816 Spondylosis without myelopathy or radiculopathy, lumbar region: Secondary | ICD-10-CM | POA: Diagnosis not present

## 2021-10-15 DIAGNOSIS — R296 Repeated falls: Secondary | ICD-10-CM | POA: Diagnosis present

## 2021-10-15 DIAGNOSIS — I10 Essential (primary) hypertension: Secondary | ICD-10-CM | POA: Diagnosis present

## 2021-10-15 DIAGNOSIS — W19XXXA Unspecified fall, initial encounter: Secondary | ICD-10-CM | POA: Diagnosis present

## 2021-10-15 DIAGNOSIS — S32029A Unspecified fracture of second lumbar vertebra, initial encounter for closed fracture: Principal | ICD-10-CM | POA: Diagnosis present

## 2021-10-15 DIAGNOSIS — S3210XS Unspecified fracture of sacrum, sequela: Secondary | ICD-10-CM | POA: Diagnosis not present

## 2021-10-15 DIAGNOSIS — Z79899 Other long term (current) drug therapy: Secondary | ICD-10-CM

## 2021-10-15 DIAGNOSIS — S3210XA Unspecified fracture of sacrum, initial encounter for closed fracture: Secondary | ICD-10-CM

## 2021-10-15 DIAGNOSIS — Z7952 Long term (current) use of systemic steroids: Secondary | ICD-10-CM

## 2021-10-15 DIAGNOSIS — Z9842 Cataract extraction status, left eye: Secondary | ICD-10-CM | POA: Diagnosis not present

## 2021-10-15 DIAGNOSIS — Z79890 Hormone replacement therapy (postmenopausal): Secondary | ICD-10-CM

## 2021-10-15 DIAGNOSIS — R600 Localized edema: Secondary | ICD-10-CM | POA: Diagnosis not present

## 2021-10-15 DIAGNOSIS — S32020A Wedge compression fracture of second lumbar vertebra, initial encounter for closed fracture: Principal | ICD-10-CM

## 2021-10-15 DIAGNOSIS — R262 Difficulty in walking, not elsewhere classified: Secondary | ICD-10-CM | POA: Diagnosis present

## 2021-10-15 DIAGNOSIS — R269 Unspecified abnormalities of gait and mobility: Secondary | ICD-10-CM | POA: Diagnosis present

## 2021-10-15 DIAGNOSIS — S32000A Wedge compression fracture of unspecified lumbar vertebra, initial encounter for closed fracture: Secondary | ICD-10-CM | POA: Diagnosis present

## 2021-10-15 DIAGNOSIS — R52 Pain, unspecified: Secondary | ICD-10-CM | POA: Diagnosis present

## 2021-10-15 DIAGNOSIS — G8929 Other chronic pain: Secondary | ICD-10-CM | POA: Diagnosis present

## 2021-10-15 DIAGNOSIS — E039 Hypothyroidism, unspecified: Secondary | ICD-10-CM | POA: Diagnosis present

## 2021-10-15 DIAGNOSIS — E569 Vitamin deficiency, unspecified: Secondary | ICD-10-CM | POA: Diagnosis not present

## 2021-10-15 DIAGNOSIS — D72829 Elevated white blood cell count, unspecified: Secondary | ICD-10-CM | POA: Diagnosis present

## 2021-10-15 DIAGNOSIS — I129 Hypertensive chronic kidney disease with stage 1 through stage 4 chronic kidney disease, or unspecified chronic kidney disease: Secondary | ICD-10-CM | POA: Diagnosis present

## 2021-10-15 DIAGNOSIS — M5416 Radiculopathy, lumbar region: Secondary | ICD-10-CM | POA: Diagnosis present

## 2021-10-15 DIAGNOSIS — N179 Acute kidney failure, unspecified: Secondary | ICD-10-CM | POA: Diagnosis present

## 2021-10-15 DIAGNOSIS — R2681 Unsteadiness on feet: Secondary | ICD-10-CM | POA: Diagnosis not present

## 2021-10-15 DIAGNOSIS — M316 Other giant cell arteritis: Secondary | ICD-10-CM | POA: Diagnosis present

## 2021-10-15 DIAGNOSIS — T380X5A Adverse effect of glucocorticoids and synthetic analogues, initial encounter: Secondary | ICD-10-CM | POA: Diagnosis present

## 2021-10-15 DIAGNOSIS — R531 Weakness: Secondary | ICD-10-CM | POA: Diagnosis not present

## 2021-10-15 DIAGNOSIS — Z9049 Acquired absence of other specified parts of digestive tract: Secondary | ICD-10-CM | POA: Diagnosis not present

## 2021-10-15 DIAGNOSIS — M6259 Muscle wasting and atrophy, not elsewhere classified, multiple sites: Secondary | ICD-10-CM | POA: Diagnosis not present

## 2021-10-15 DIAGNOSIS — R768 Other specified abnormal immunological findings in serum: Secondary | ICD-10-CM | POA: Diagnosis present

## 2021-10-15 DIAGNOSIS — R0902 Hypoxemia: Secondary | ICD-10-CM | POA: Diagnosis not present

## 2021-10-15 DIAGNOSIS — M549 Dorsalgia, unspecified: Secondary | ICD-10-CM | POA: Diagnosis not present

## 2021-10-15 DIAGNOSIS — M8008XA Age-related osteoporosis with current pathological fracture, vertebra(e), initial encounter for fracture: Secondary | ICD-10-CM

## 2021-10-15 LAB — BASIC METABOLIC PANEL
Anion gap: 10 (ref 5–15)
BUN: 38 mg/dL — ABNORMAL HIGH (ref 8–23)
CO2: 26 mmol/L (ref 22–32)
Calcium: 9 mg/dL (ref 8.9–10.3)
Chloride: 102 mmol/L (ref 98–111)
Creatinine, Ser: 1.72 mg/dL — ABNORMAL HIGH (ref 0.44–1.00)
GFR, Estimated: 28 mL/min — ABNORMAL LOW (ref >60)
Glucose, Bld: 160 mg/dL — ABNORMAL HIGH (ref 70–99)
Potassium: 3.5 mmol/L (ref 3.5–5.1)
Sodium: 138 mmol/L (ref 135–145)

## 2021-10-15 LAB — CBC
HCT: 36.3 % (ref 36.0–46.0)
Hemoglobin: 11.7 g/dL — ABNORMAL LOW (ref 12.0–15.0)
MCH: 31.3 pg (ref 26.0–34.0)
MCHC: 32.2 g/dL (ref 30.0–36.0)
MCV: 97.1 fL (ref 80.0–100.0)
Platelets: 240 10*3/uL (ref 150–400)
RBC: 3.74 MIL/uL — ABNORMAL LOW (ref 3.87–5.11)
RDW: 13.9 % (ref 11.5–15.5)
WBC: 12 10*3/uL — ABNORMAL HIGH (ref 4.0–10.5)
nRBC: 0 % (ref 0.0–0.2)

## 2021-10-15 IMAGING — CT CT L SPINE W/O CM
3 of 5 series · 12 of 33 positions shown, 13 images · non-contrast
Comparison: MRI from [DATE].

CLINICAL DATA: Initial evaluation for acute low back pain, trauma.



[Series 3: multi disc · axial · 0.21mm/px · z∈[-970,-800]mm · 4 of 114 slices shown, 5 images]
[im 23/114  soft-tissue]
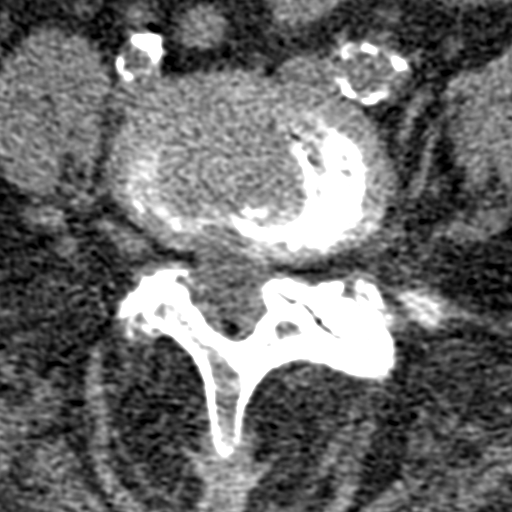
[im 23/114  bone]
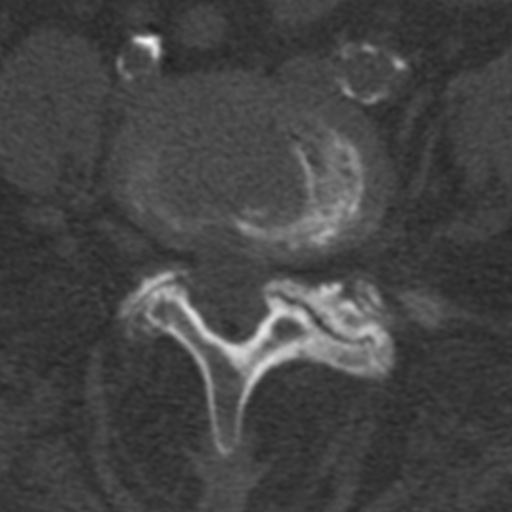
[im 46/114  bone]
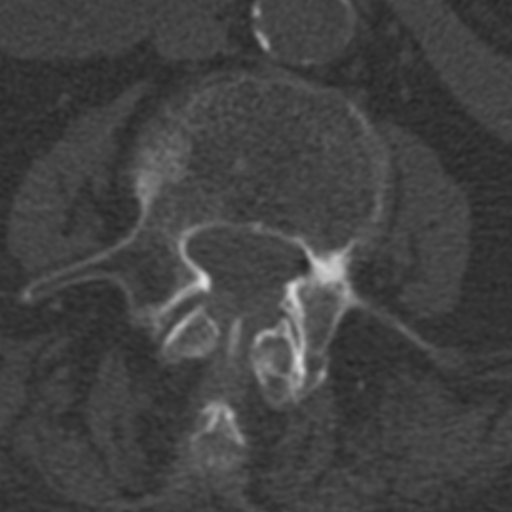
[im 68/114  bone]
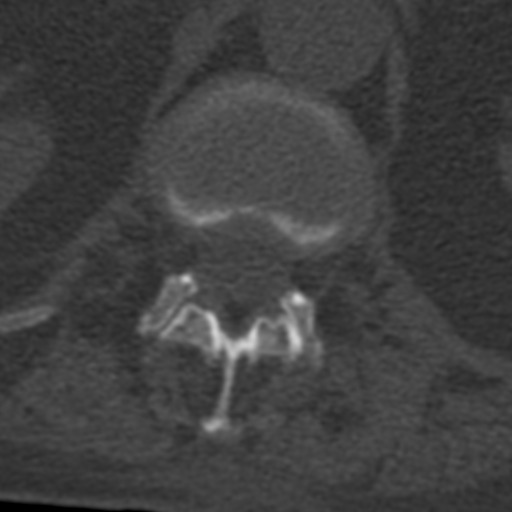
[im 91/114  bone]
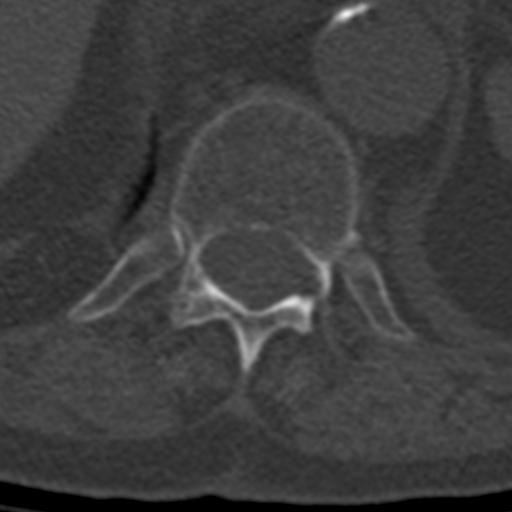

[Series 6: coronal bone · coronal · 0.62mm/px · 3 of 178 slices shown]
[im 36/178  bone]
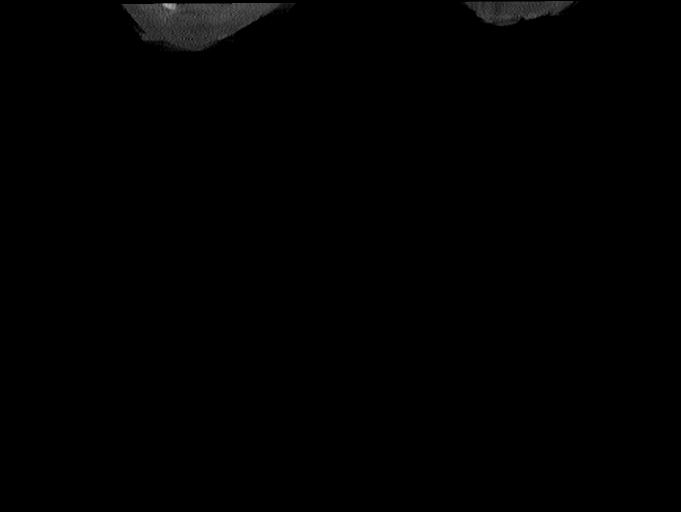
[im 71/178  bone]
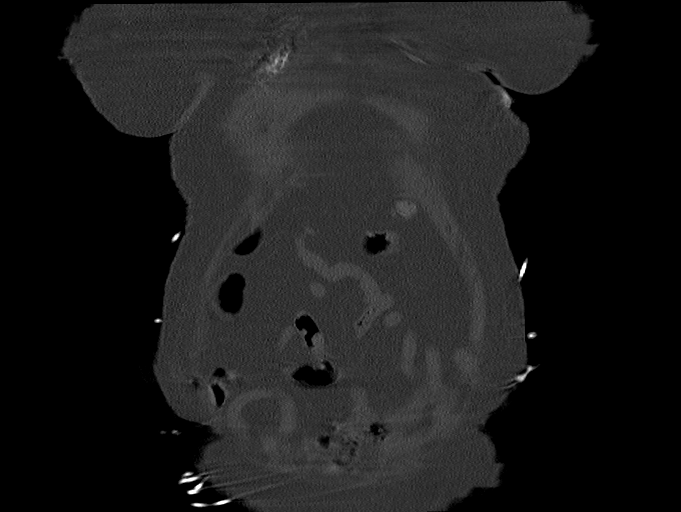
[im 107/178  bone]
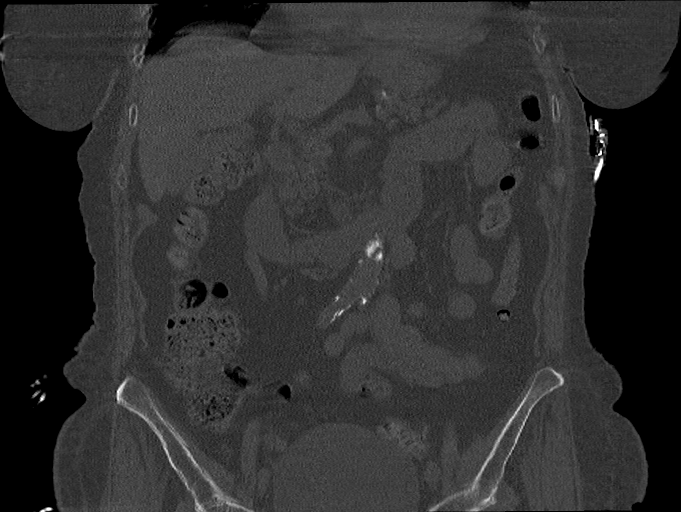

[Series 7: sagittal bone · sagittal · 0.35mm/px · 5 of 113 slices shown]
[im 19/113  bone]
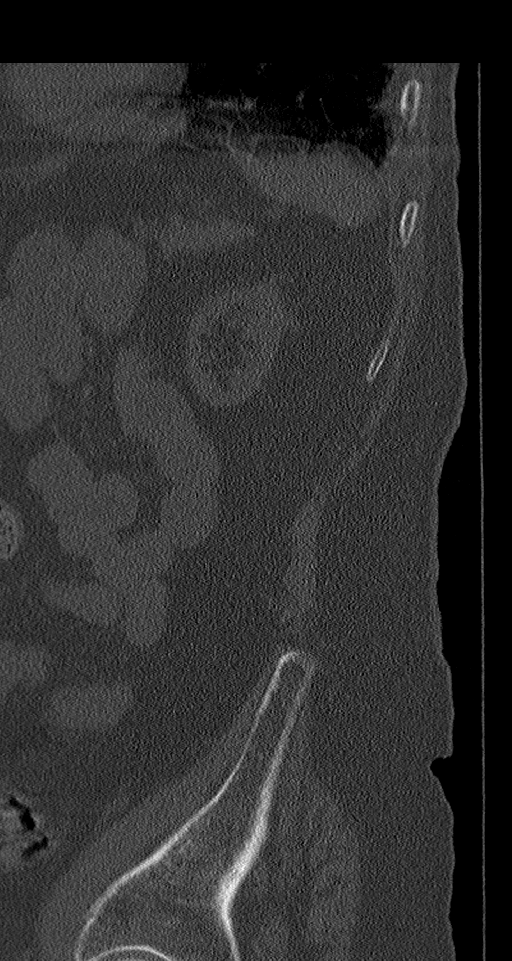
[im 38/113  bone]
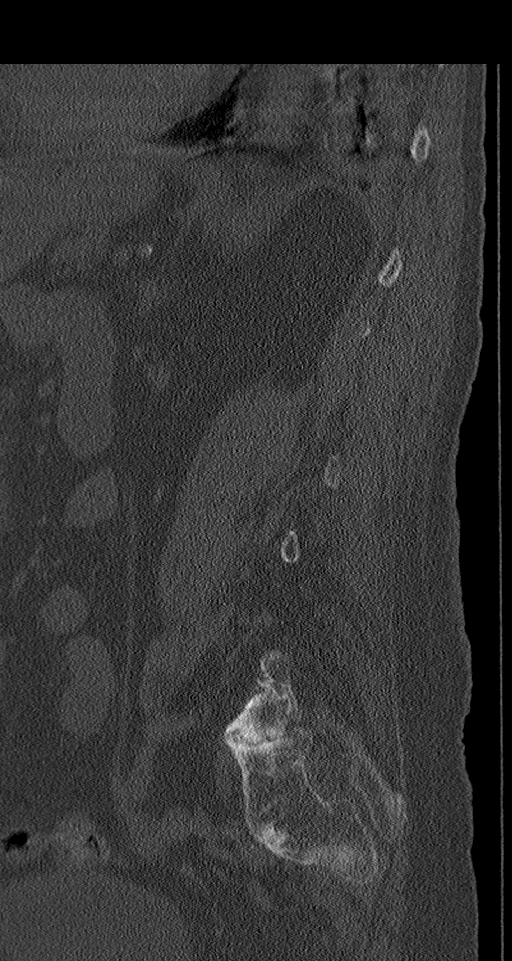
[im 57/113  bone]
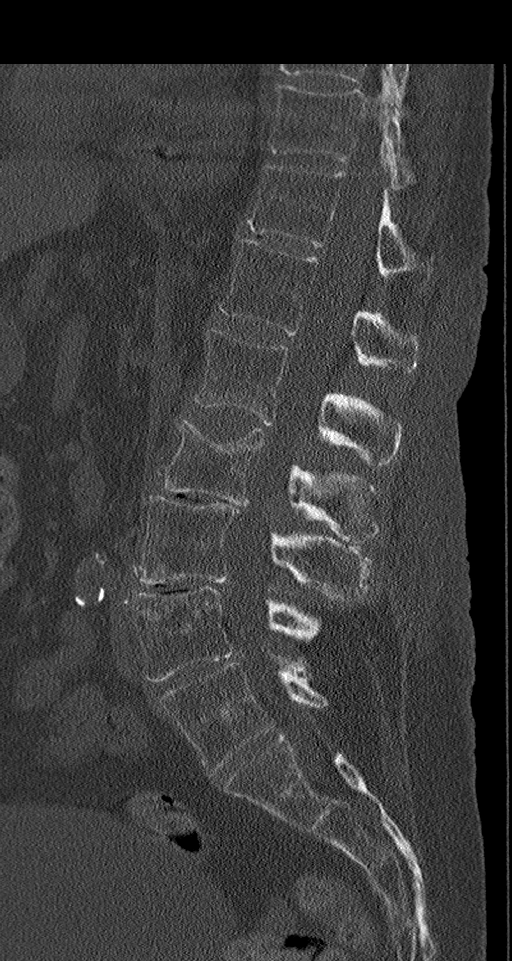
[im 75/113  bone]
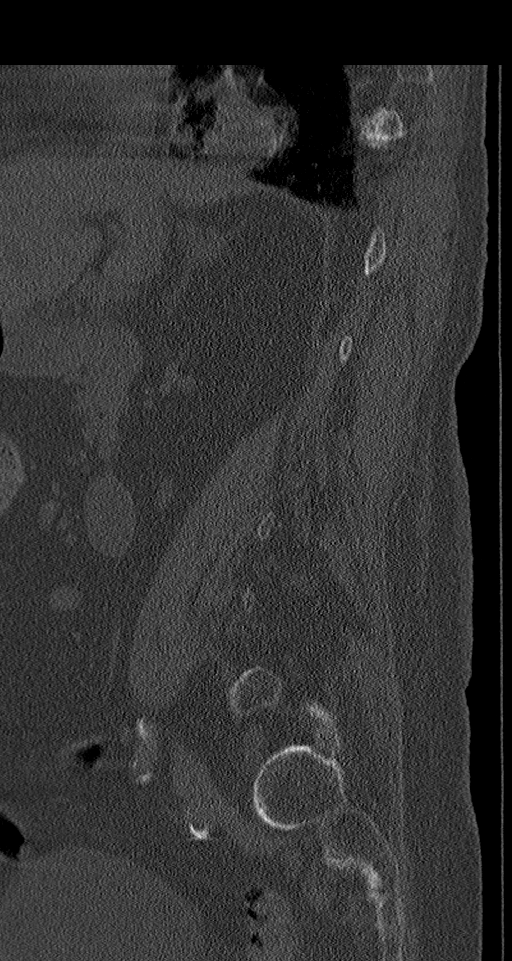
[im 94/113  bone]
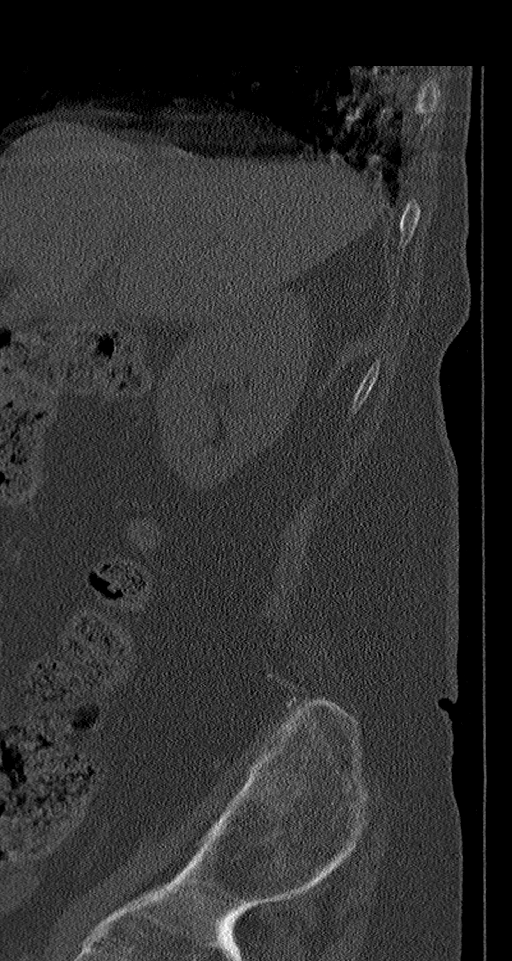

[12 of 33 positions shown; findings below may reference images not displayed]

FINDINGS: Segmentation: Transitional features seen about the lumbosacral
junction with partial sacralization of the L5 vertebral body.

Alignment: Levoscoliosis.  No interval listhesis.

Vertebrae: Acute to subacute compression fracture seen involving the
superior endplate of L2. Associated height loss measures up to 40%
without significant bony retropulsion.

Endplate sclerosis at the superior endplate of L4 without definite
fracture, likely chronic. Evolving bilateral sacral insufficiency
fractures, right greater than left, seen on prior MRI.

No other acute fracture.  No discrete or worrisome osseous lesions.

Paraspinal and other soft tissues: Paraspinous soft tissues
demonstrate no acute finding. Chronic fatty atrophy noted within the
posterior paraspinous musculature. Asymmetric left renal atrophy.
Moderate aorto bi-iliac atherosclerotic disease.

Disc levels: Underlying moderate degenerative spondylosis at L2-3
and L3-4. Multilevel facet hypertrophy, greater on the left. Overall
appearance is grossly similar to previous MRI.
IMPRESSION: 1. Acute to subacute compression fracture involving the superior
endplate of L2 with up to 40% height loss without significant bony
retropulsion.
2. Evolving bilateral sacral insufficiency fractures, right greater
than left.
3. Levoscoliosis with underlying multilevel degenerative spondylosis
and facet arthrosis, grossly similar to recent MRI from [DATE].

Aortic Atherosclerosis ([Q4]-[Q4]).

## 2021-10-15 MED ORDER — LEVOTHYROXINE SODIUM 50 MCG PO TABS: 50.0000 ug | ORAL_TABLET | Freq: Every day | ORAL | Status: DC

## 2021-10-15 MED ORDER — PREDNISONE 20 MG PO TABS: 25.0000 mg | ORAL_TABLET | Freq: Every day | ORAL | Status: DC

## 2021-10-15 MED ORDER — MAGNESIUM OXIDE 400 MG PO TABS
400.0000 mg | ORAL_TABLET | Freq: Every day | ORAL | Status: DC
  Administered 2021-10-16 – 2021-10-20 (×4): 400 mg via ORAL
  Filled 2021-10-15 (×9): qty 1

## 2021-10-15 MED ORDER — LEVOTHYROXINE SODIUM 50 MCG PO TABS
50.0000 ug | ORAL_TABLET | Freq: Every day | ORAL | Status: DC
  Administered 2021-10-16 – 2021-10-20 (×5): 50 ug via ORAL
  Filled 2021-10-15 (×5): qty 1

## 2021-10-15 MED ORDER — PANTOPRAZOLE SODIUM 40 MG PO TBEC
40.0000 mg | DELAYED_RELEASE_TABLET | Freq: Every day | ORAL | Status: DC
  Administered 2021-10-16 – 2021-10-20 (×4): 40 mg via ORAL
  Filled 2021-10-15 (×4): qty 1

## 2021-10-15 MED ORDER — AMLODIPINE BESYLATE 10 MG PO TABS
10.0000 mg | ORAL_TABLET | Freq: Every day | ORAL | Status: DC
  Administered 2021-10-16 – 2021-10-20 (×4): 10 mg via ORAL
  Filled 2021-10-15: qty 2
  Filled 2021-10-15 (×3): qty 1

## 2021-10-15 MED ORDER — ACETAMINOPHEN 325 MG PO TABS
650.0000 mg | ORAL_TABLET | Freq: Four times a day (QID) | ORAL | Status: DC | PRN
Start: 2021-10-15 — End: 2021-10-20
  Administered 2021-10-19 – 2021-10-20 (×3): 650 mg via ORAL
  Filled 2021-10-15 (×3): qty 2

## 2021-10-15 NOTE — ED Triage Notes (Signed)
Pt to ED via [facility] from home with c/o back pain. Per EMS pt also c/o generalized weakness and falls. Per EMS pt fell 2x yesterday. Per EMS pt with hx of fractures in her back and HTN.   107  94% RA 134/92  Pt states was recently hospitalized for a week, then transferred to [facility] for rehab and discharged from [facility] on Friday and since thing has "gone down hill".

## 2021-10-15 NOTE — ED Provider Notes (Signed)
[facility] Provider Note    Event Date/Time   First MD Initiated Contact with Patient 10/15/21 2212     (approximate)  History   Chief Complaint: Weakness and Back Pain  HPI  Amy Hensley is a 88 y.o. female with a past medical history of arthritis, CKD, hypertension, presents to the emergency department for generalized weakness and trouble ambulating.  According to the patient she was recently sent to [facility] for rehabilitation.  Patient states she has been doing better however she was discharged from [facility] on Friday.  Since going home she states she has not been doing well, lives at home with her 75 year old husband who is not able to really help substantially.  Patient states history of severe low back pain as well as lower extremity weakness which has been an ongoing issue since November per patient.  She states this was the reason she went to [facility].  States she was doing better she was to the point where she can ambulate with a walker but states she has not been able to ambulate since going home.  Patient denies any new falls but she has had several episodes where she could not get off the bed or off the toilet due to weakness and back pain.  They had to call the fire department to help her get up.  Patient does not believe she can go home safely and believes she may need more rehabilitation.  Physical Exam   Triage Vital Signs: ED Triage Vitals  Enc Vitals Group     BP 10/15/21 2209 137/62     Pulse Rate 10/15/21 2209 99     Resp 10/15/21 2209 14     Temp 10/15/21 2209 98.4 F (36.9 C)     Temp Source 10/15/21 2209 Oral     SpO2 10/15/21 2209 93 %     Weight 10/15/21 2203 132 lb 4.4 oz (60 kg)     Height 10/15/21 2203 '5\' 4"'$  (1.626 m)     Head Circumference --      [facility] Flow --      Pain Score 10/15/21 2203 5     Pain Loc --      Pain Edu? --      Excl. in [place]? --     Most recent vital signs: Vitals:   10/15/21 2209   BP: 137/62  Pulse: 99  Resp: 14  Temp: 98.4 F (36.9 C)  SpO2: 93%    General: Awake, no distress.  CV:  Good peripheral perfusion.  Regular rate and rhythm  Resp:  Normal effort.  Equal breath sounds bilaterally.  Abd:  No distention.  Soft, nontender.  No rebound or guarding.    ED Results / Procedures / Treatments   RADIOLOGY  I have reviewed the CT images no significant retrolisthesis seen on my evaluation/interpretation.   MEDICATIONS ORDERED IN ED: Medications - No data to display   IMPRESSION / MDM / [place] / ED COURSE  I reviewed the triage vital signs and the nursing notes.  Patient's presentation is most consistent with acute presentation with potential threat to life or bodily function.  Patient presents emergency department for continued lower back pain and generalized weakness worse in the lower extremities.  Patient states a history of the same since November.  Patient recently went to [facility] however was discharged Friday.  Since going home patient states continued weakness unable to ambulate at home, unable to  get out of bed or off the toilet by herself without assistance and states her 66 year old husband cannot help.  They had to call the fire department to get her up off the toilet.  Patient believes she needs further rehab.  Patient may need further strengthening before she can safely go home.  We will check labs obtain CT imaging of the back as the patient is complaining of low back pain although she states it is chronic.  If the patient's medical work-up is nonrevealing anticipate likely social work/PT evaluation in the morning to help with placement back into a rehab facility.  CBC shows slight leukocytosis however the patient was taking prednisone per patient.  Chemistry shows renal insufficiency largely unchanged from historical values.  Urinalysis and CT scan of the back are pending. Patient care signed out to oncoming  provider.  FINAL CLINICAL IMPRESSION(S) / ED DIAGNOSES   Back pain Weakness   Note:  This document was prepared using Dragon voice recognition software and may include unintentional dictation errors.   Harvest Dark, MD 10/15/21 2302

## 2021-10-16 ENCOUNTER — Inpatient Hospital Stay: Payer: PPO

## 2021-10-16 DIAGNOSIS — Z87891 Personal history of nicotine dependence: Secondary | ICD-10-CM | POA: Diagnosis not present

## 2021-10-16 DIAGNOSIS — W19XXXA Unspecified fall, initial encounter: Secondary | ICD-10-CM | POA: Diagnosis present

## 2021-10-16 DIAGNOSIS — Z79890 Hormone replacement therapy (postmenopausal): Secondary | ICD-10-CM | POA: Diagnosis not present

## 2021-10-16 DIAGNOSIS — S32000A Wedge compression fracture of unspecified lumbar vertebra, initial encounter for closed fracture: Secondary | ICD-10-CM | POA: Diagnosis not present

## 2021-10-16 DIAGNOSIS — R52 Pain, unspecified: Secondary | ICD-10-CM | POA: Diagnosis present

## 2021-10-16 DIAGNOSIS — Z7952 Long term (current) use of systemic steroids: Secondary | ICD-10-CM | POA: Diagnosis not present

## 2021-10-16 DIAGNOSIS — S32029A Unspecified fracture of second lumbar vertebra, initial encounter for closed fracture: Secondary | ICD-10-CM | POA: Diagnosis present

## 2021-10-16 DIAGNOSIS — T380X5A Adverse effect of glucocorticoids and synthetic analogues, initial encounter: Secondary | ICD-10-CM | POA: Diagnosis present

## 2021-10-16 DIAGNOSIS — N1832 Chronic kidney disease, stage 3b: Secondary | ICD-10-CM | POA: Diagnosis present

## 2021-10-16 DIAGNOSIS — Z9842 Cataract extraction status, left eye: Secondary | ICD-10-CM | POA: Diagnosis not present

## 2021-10-16 DIAGNOSIS — R269 Unspecified abnormalities of gait and mobility: Secondary | ICD-10-CM | POA: Diagnosis present

## 2021-10-16 DIAGNOSIS — Z79899 Other long term (current) drug therapy: Secondary | ICD-10-CM | POA: Diagnosis not present

## 2021-10-16 DIAGNOSIS — I129 Hypertensive chronic kidney disease with stage 1 through stage 4 chronic kidney disease, or unspecified chronic kidney disease: Secondary | ICD-10-CM | POA: Diagnosis present

## 2021-10-16 DIAGNOSIS — M316 Other giant cell arteritis: Secondary | ICD-10-CM | POA: Diagnosis present

## 2021-10-16 DIAGNOSIS — R296 Repeated falls: Secondary | ICD-10-CM | POA: Diagnosis present

## 2021-10-16 DIAGNOSIS — G8929 Other chronic pain: Secondary | ICD-10-CM | POA: Diagnosis present

## 2021-10-16 DIAGNOSIS — Z961 Presence of intraocular lens: Secondary | ICD-10-CM | POA: Diagnosis present

## 2021-10-16 DIAGNOSIS — M5416 Radiculopathy, lumbar region: Secondary | ICD-10-CM | POA: Diagnosis present

## 2021-10-16 DIAGNOSIS — S32020A Wedge compression fracture of second lumbar vertebra, initial encounter for closed fracture: Secondary | ICD-10-CM | POA: Diagnosis not present

## 2021-10-16 DIAGNOSIS — Z888 Allergy status to other drugs, medicaments and biological substances status: Secondary | ICD-10-CM | POA: Diagnosis not present

## 2021-10-16 DIAGNOSIS — E039 Hypothyroidism, unspecified: Secondary | ICD-10-CM | POA: Diagnosis present

## 2021-10-16 DIAGNOSIS — Z9049 Acquired absence of other specified parts of digestive tract: Secondary | ICD-10-CM | POA: Diagnosis not present

## 2021-10-16 DIAGNOSIS — S32010A Wedge compression fracture of first lumbar vertebra, initial encounter for closed fracture: Secondary | ICD-10-CM | POA: Diagnosis present

## 2021-10-16 LAB — TSH: TSH: 0.453 u[IU]/mL (ref 0.350–4.500)

## 2021-10-16 LAB — COMPREHENSIVE METABOLIC PANEL
ALT: 19 U/L (ref 0–44)
AST: 18 U/L (ref 15–41)
Albumin: 2.6 g/dL — ABNORMAL LOW (ref 3.5–5.0)
Alkaline Phosphatase: 87 U/L (ref 38–126)
Anion gap: 6 (ref 5–15)
BUN: 33 mg/dL — ABNORMAL HIGH (ref 8–23)
CO2: 29 mmol/L (ref 22–32)
Calcium: 8.7 mg/dL — ABNORMAL LOW (ref 8.9–10.3)
Chloride: 104 mmol/L (ref 98–111)
Creatinine, Ser: 1.36 mg/dL — ABNORMAL HIGH (ref 0.44–1.00)
GFR, Estimated: 38 mL/min — ABNORMAL LOW (ref >60)
Glucose, Bld: 90 mg/dL (ref 70–99)
Potassium: 3.5 mmol/L (ref 3.5–5.1)
Sodium: 139 mmol/L (ref 135–145)
Total Bilirubin: 0.7 mg/dL (ref 0.3–1.2)
Total Protein: 5.8 g/dL — ABNORMAL LOW (ref 6.5–8.1)

## 2021-10-16 LAB — CBC
HCT: 34.8 % — ABNORMAL LOW (ref 36.0–46.0)
Hemoglobin: 11 g/dL — ABNORMAL LOW (ref 12.0–15.0)
MCH: 30.5 pg (ref 26.0–34.0)
MCHC: 31.6 g/dL (ref 30.0–36.0)
MCV: 96.4 fL (ref 80.0–100.0)
Platelets: 222 10*3/uL (ref 150–400)
RBC: 3.61 MIL/uL — ABNORMAL LOW (ref 3.87–5.11)
RDW: 13.9 % (ref 11.5–15.5)
WBC: 12 10*3/uL — ABNORMAL HIGH (ref 4.0–10.5)
nRBC: 0 % (ref 0.0–0.2)

## 2021-10-16 LAB — URINALYSIS, ROUTINE W REFLEX MICROSCOPIC
Bilirubin Urine: NEGATIVE
Glucose, UA: NEGATIVE mg/dL
Hgb urine dipstick: NEGATIVE
Ketones, ur: NEGATIVE mg/dL
Leukocytes,Ua: NEGATIVE
Nitrite: NEGATIVE
Protein, ur: NEGATIVE mg/dL
Specific Gravity, Urine: 1.012 (ref 1.005–1.030)
pH: 7 (ref 5.0–8.0)

## 2021-10-16 LAB — MAGNESIUM: Magnesium: 2 mg/dL (ref 1.7–2.4)

## 2021-10-16 IMAGING — US US EXTREM LOW VENOUS
1 series · 14 of 24 positions shown · non-contrast
Comparison: None Available.

CLINICAL DATA: [MF]; edema

EXAM:
Bilateral LOWER EXTREMITY VENOUS DOPPLER ULTRASOUND
TECHNIQUE: Gray-scale sonography with compression, as well as color and duplex
ultrasound, were performed to evaluate the deep venous system(s)
from the level of the common femoral vein through the popliteal and
proximal calf veins.

[Series 1: us venous img lower bilat (dvt) · portal-venous · 14 of 64 slices shown]
[im 1/64]
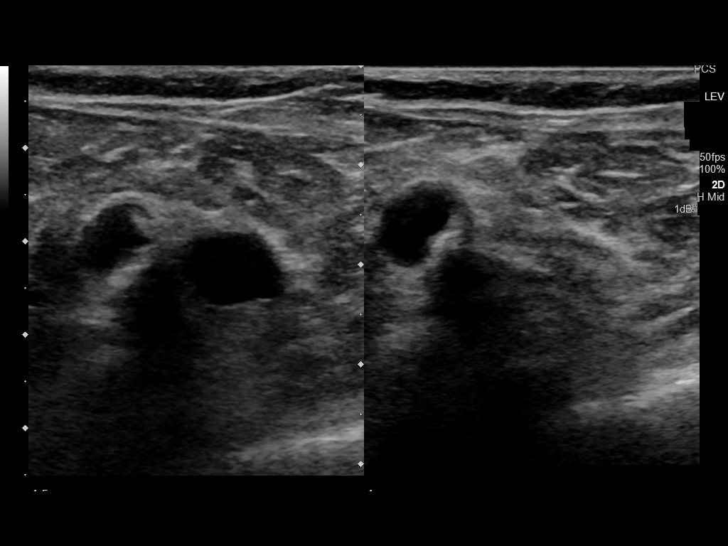
[im 6/64]
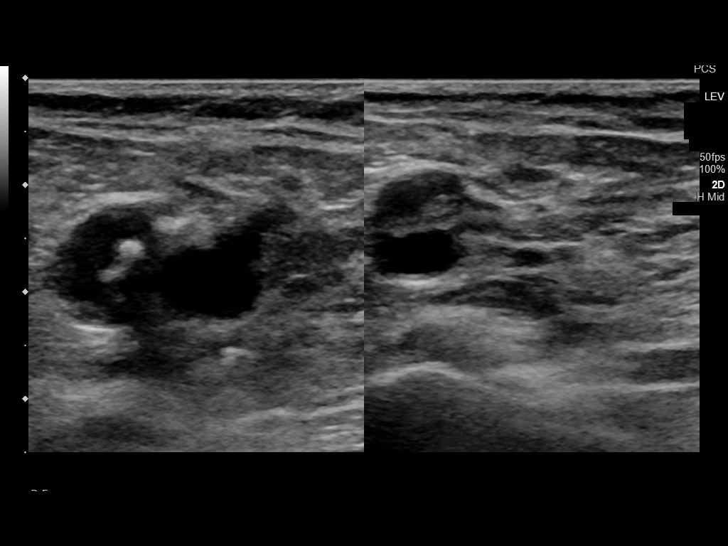
[im 11/64]
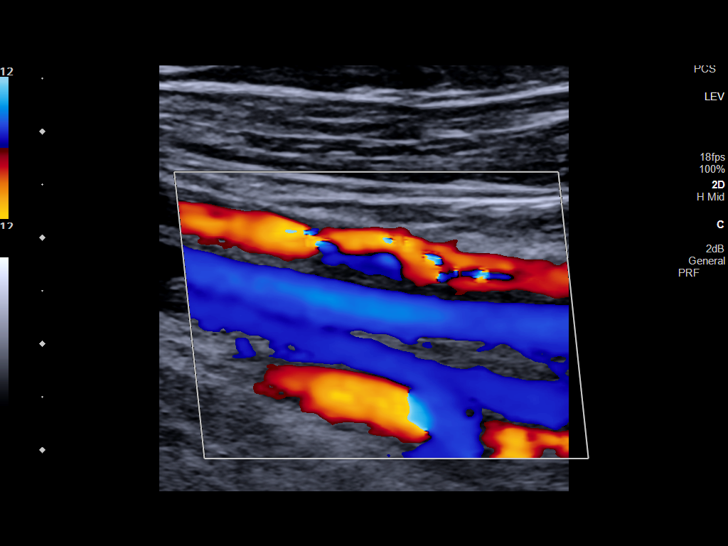
[im 17/64]
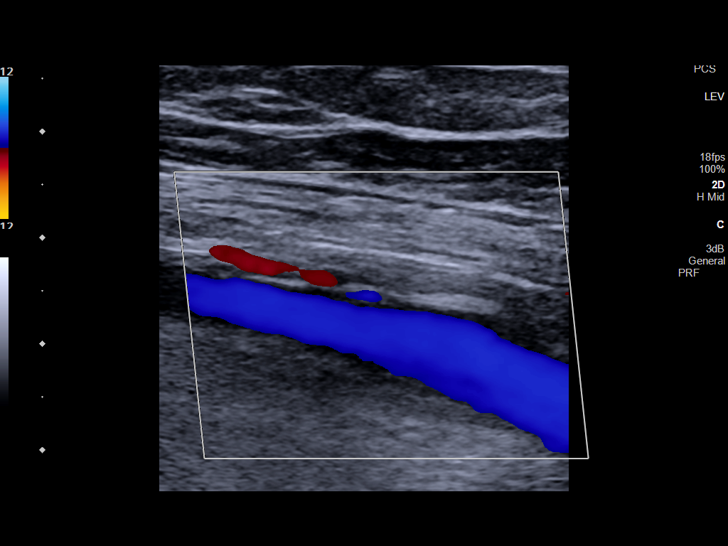
[im 20/64]
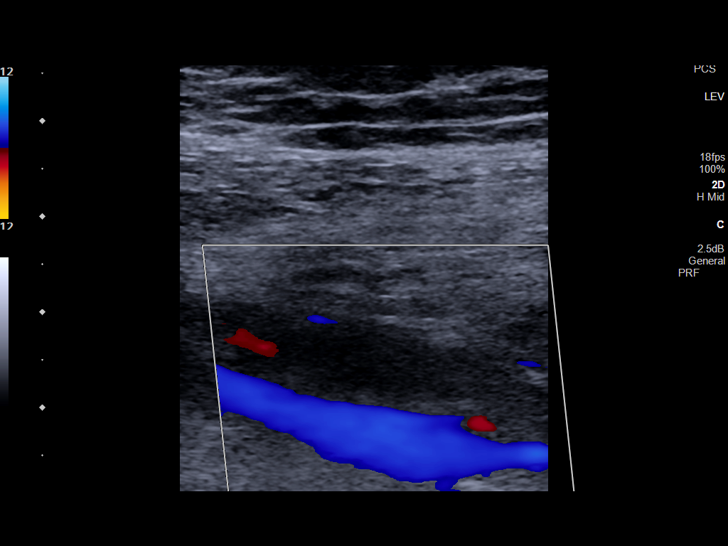
[im 25/64]
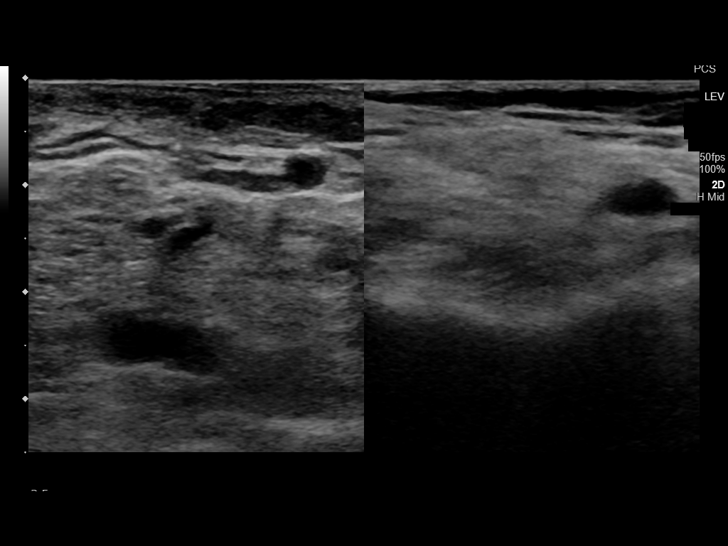
[im 31/64]
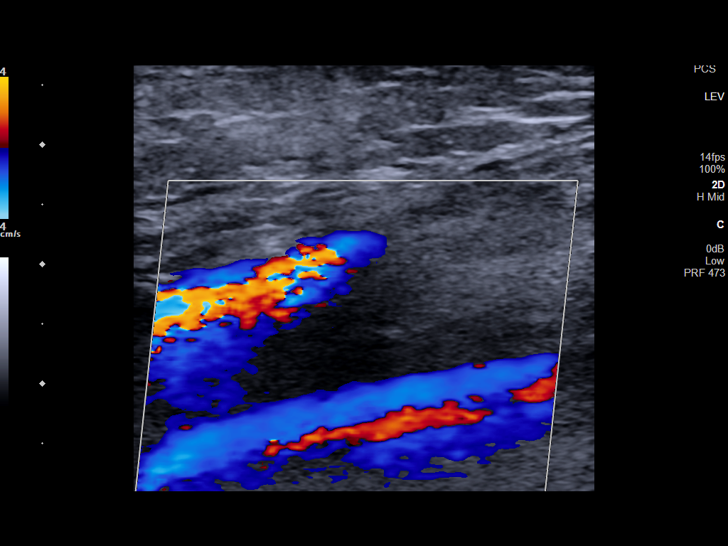
[im 33/64]
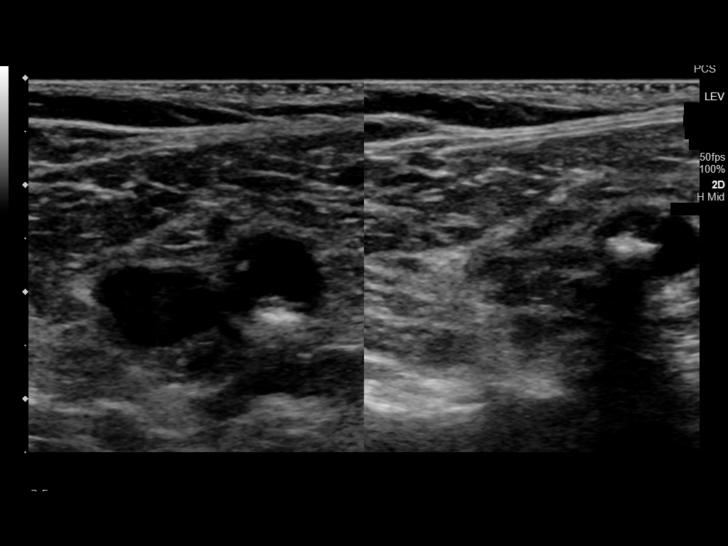
[im 39/64]
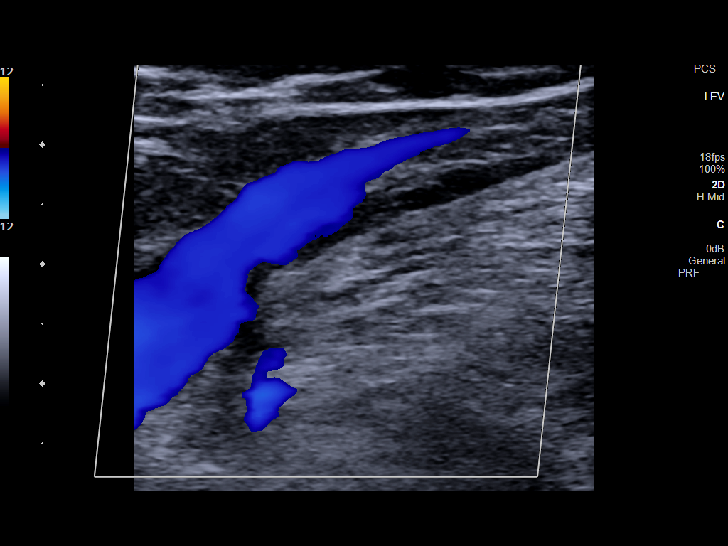
[im 44/64]
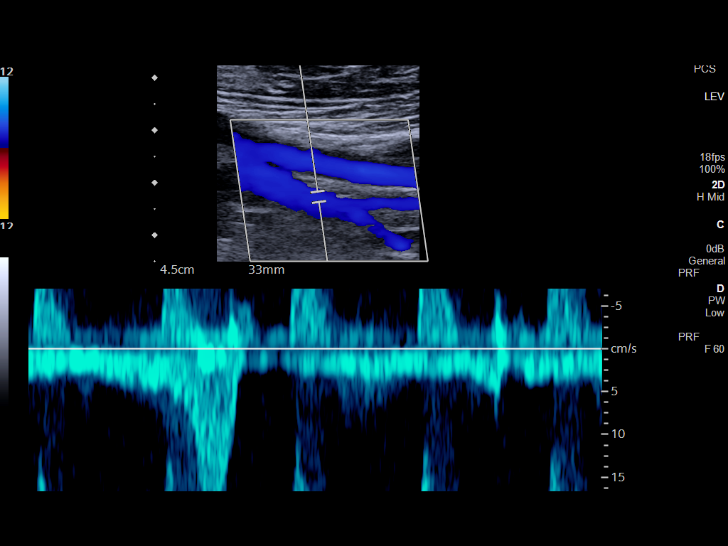
[im 50/64]
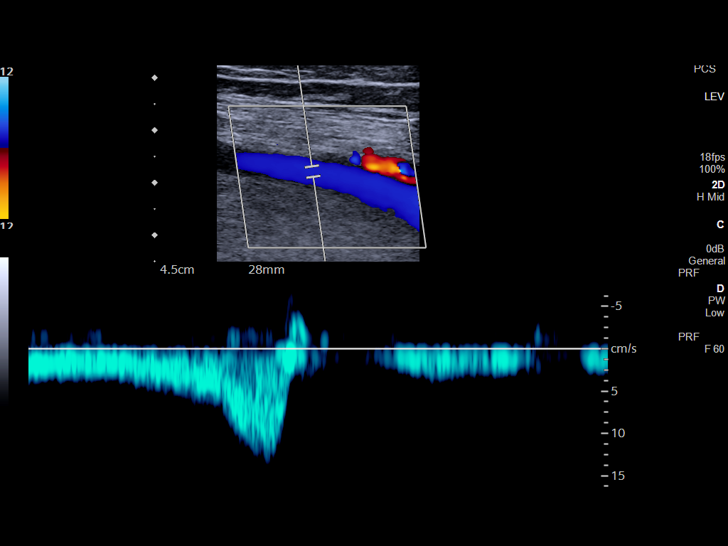
[im 53/64]
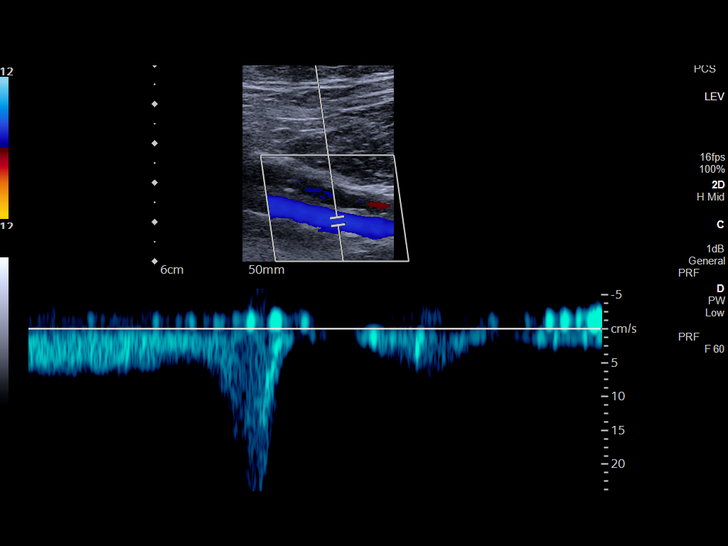
[im 58/64]
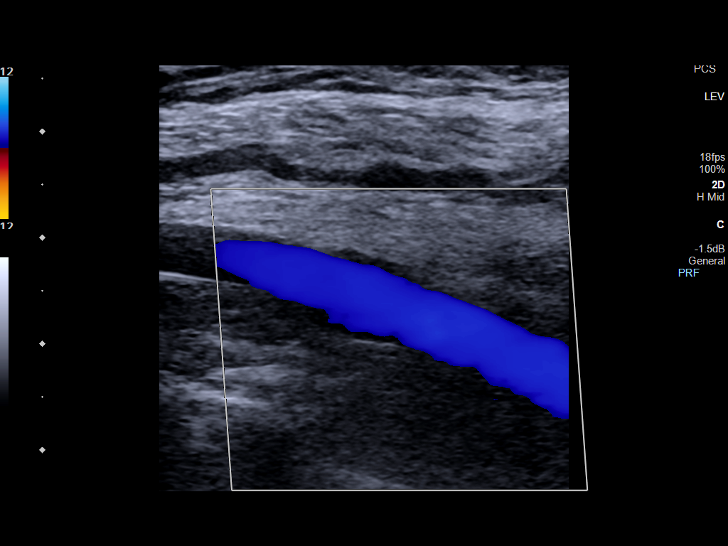
[im 64/64]
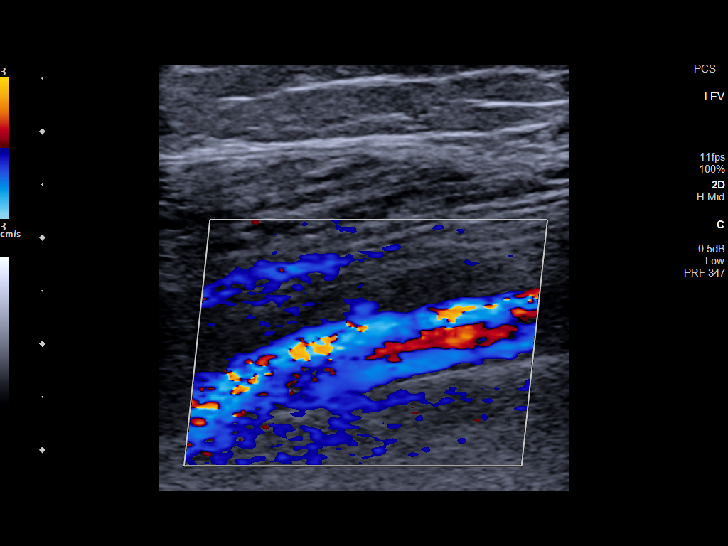

[14 of 24 positions shown; findings below may reference images not displayed]

FINDINGS: VENOUS

Normal compressibility of the common femoral, superficial femoral,
and popliteal veins, as well as the visualized calf veins.
Visualized portions of profunda femoral vein and great saphenous
vein unremarkable. No filling defects to suggest DVT on grayscale or
color Doppler imaging. Doppler waveforms show normal direction of
venous flow, normal respiratory plasticity and response to
augmentation.

Limited views of the contralateral common femoral vein are
unremarkable.

OTHER

None.

Limitations: none
IMPRESSION: Negative.

## 2021-10-16 MED ORDER — ONDANSETRON HCL 4 MG PO TABS: 4.0000 mg | ORAL_TABLET | Freq: Four times a day (QID) | ORAL | Status: DC | PRN

## 2021-10-16 MED ORDER — PREDNISONE 20 MG PO TABS
20.0000 mg | ORAL_TABLET | Freq: Every day | ORAL | Status: DC
  Administered 2021-10-16 – 2021-10-20 (×5): 20 mg via ORAL
  Filled 2021-10-16 (×5): qty 1

## 2021-10-16 MED ORDER — CODEINE SULFATE 30 MG PO TABS
15.0000 mg | ORAL_TABLET | Freq: Four times a day (QID) | ORAL | Status: DC | PRN
  Administered 2021-10-16: 15 mg via ORAL
  Filled 2021-10-16 (×2): qty 1

## 2021-10-16 MED ORDER — HEPARIN SODIUM (PORCINE) 5000 UNIT/ML IJ SOLN
5000.0000 [IU] | Freq: Three times a day (TID) | INTRAMUSCULAR | Status: DC
  Administered 2021-10-16 – 2021-10-17 (×4): 5000 [IU] via SUBCUTANEOUS
  Filled 2021-10-16 (×4): qty 1

## 2021-10-16 MED ORDER — ONDANSETRON HCL 4 MG/2ML IJ SOLN: 4.0000 mg | Freq: Four times a day (QID) | INTRAMUSCULAR | Status: DC | PRN

## 2021-10-16 MED ORDER — OXYCODONE HCL 5 MG PO TABS
5.0000 mg | ORAL_TABLET | ORAL | Status: DC | PRN
  Administered 2021-10-16 – 2021-10-18 (×7): 5 mg via ORAL
  Filled 2021-10-16 (×7): qty 1

## 2021-10-16 MED ORDER — MORPHINE SULFATE (PF) 2 MG/ML IV SOLN: 2.0000 mg | INTRAVENOUS | Status: DC | PRN

## 2021-10-16 MED ORDER — POLYETHYLENE GLYCOL 3350 17 G PO PACK
17.0000 g | PACK | Freq: Every day | ORAL | Status: DC | PRN
  Administered 2021-10-16 – 2021-10-17 (×2): 17 g via ORAL
  Filled 2021-10-16 (×2): qty 1

## 2021-10-16 MED ORDER — PREGABALIN 25 MG PO CAPS
25.0000 mg | ORAL_CAPSULE | Freq: Two times a day (BID) | ORAL | Status: DC
  Administered 2021-10-16 – 2021-10-20 (×8): 25 mg via ORAL
  Filled 2021-10-16 (×8): qty 1

## 2021-10-16 NOTE — Assessment & Plan Note (Signed)
-   Patient currently trying to wean off of prednisone -She is tapering off with her last dose being 25 mg daily, current dose 20 mg daily Continue 20 mg daily -Continue to monitor

## 2021-10-16 NOTE — Assessment & Plan Note (Signed)
Continue Synthroid.  Check TSH. 

## 2021-10-16 NOTE — Plan of Care (Signed)
Pt admitted to unit. Assessed as per documentation. Will continue to monitor.

## 2021-10-16 NOTE — Assessment & Plan Note (Signed)
-   With recent fall, and intractable back pain -PT eval and treat -Will likely need placement -TOC consulted

## 2021-10-16 NOTE — ED Notes (Signed)
Pt moved to a hospital bed and placed on a hospital gown. Pericare performed and new female purewick placed. Placed on heart monitor. No other needs found at this moment.

## 2021-10-16 NOTE — Progress Notes (Signed)
[facility] PLAN OF CARE NOTE Patient: Amy Hensley JGO:[id number]   PCP: Adin Hector, MD DOB: [birth year]   DOA: 10/15/2021   DOS: 10/16/2021    Patient was admitted by my colleague earlier on 10/16/2021. I have reviewed the H&P as well as assessment and plan and agree with the same. Important changes in the plan are listed below.  Plan of care: Principal Problem:   Lumbar compression fracture, closed, initial encounter [facility]) Active Problems:   Ambulatory dysfunction   Leukocytosis   Long term (current) use of systemic steroids   Hepatitis C antibody positive in blood   Stage 3b chronic kidney disease (HCC)   Hypertension   Hypothyroidism   Intractable pain Continue pain control. Continue PT OT. Neurosurgery consulted.  Recommend LSO brace. IR consult recommended for kyphoplasty.  We will place the orders.  Author: Berle Mull, MD [facility] 10/16/2021 1:37 PM   If 7PM-7AM, please contact night-coverage at [email or web address]

## 2021-10-16 NOTE — Assessment & Plan Note (Deleted)
-   Known chronic kidney disease with that in very near baseline.  Baseline 1.4-1.6, today creatinine 1.72 -Avoid use of nephrotoxic agents when possible -Trend in the a.m.

## 2021-10-16 NOTE — Consult Note (Signed)
Neurosurgery-New Consultation Evaluation 10/16/2021 Amy Hensley [id number]  Identifying Statement: Amy Hensley is a 88 y.o. female from [place] [id number] with a history of giant cell arteritis, chronic back pain, hypertension, hypothyroidism, CKD stage IIIb  Physician Requesting Consultation: No ref. provider found  History of Present Illness: Amy Hensley is an 88 y.o presenting with increased low back and pelvic pain. She states she has been having this pain since November of 2022 without any particular inciting event. She was recently hospitalized and subsequently discharged to rehab. She was discharged from rehab last week and was doing fairly well per the patient and her husband, walking with assistance and a walker until a few days ago when she had increased pain and became unable to walk prompting further evaluation in the ER. She reports urinary incontinence for the last several months, aching pain into her legs R>L and low back and pelvic pain particular with changing positions or attempting to ambulate.   Past Medical History:  Past Medical History:  Diagnosis Date   Arthritis    Chronic kidney disease    stage 3   Hypertension    Hypothyroidism     Social History: Social History   Socioeconomic History   Marital status: Married    Spouse name: Not on file   Number of children: Not on file   Years of education: Not on file   Highest education level: Not on file  Occupational History   Not on file  Tobacco Use   Smoking status: Former    Packs/day: 1.00    Types: Cigarettes   Smokeless tobacco: Never   Tobacco comments:    Quit 2022  Vaping Use   Vaping Use: Never used  Substance and Sexual Activity   Alcohol use: No   Drug use: No   Sexual activity: Not on file  Other Topics Concern   Not on file  Social History Narrative   Not on file   Social Determinants of Health   Financial Resource Strain: Not on file  Food Insecurity: Not on file   Transportation Needs: Not on file  Physical Activity: Not on file  Stress: Not on file  Social Connections: Not on file  Intimate Partner Violence: Not on file   Living arrangements (living alone, with partner): at home with her husband   Family History: History reviewed. No pertinent family history.  Review of Systems:  Review of Systems - General ROS: Negative Psychological ROS: Negative Ophthalmic ROS: Negative ENT ROS: Negative Hematological and Lymphatic ROS: Negative  Endocrine ROS: Negative Respiratory ROS: Negative Cardiovascular ROS: Negative Gastrointestinal ROS: Negative Genito-Urinary ROS: Negative Musculoskeletal ROS: Negative Neurological ROS: Negative Dermatological ROS: Negative  Physical Exam: BP 129/70   Pulse 81   Temp 98.4 F (36.9 C) (Oral)   Resp 17   Ht '5\' 4"'$  (1.626 m)   Wt 60 kg   SpO2 94%   BMI 22.71 kg/m  Body mass index is 22.71 kg/m. Body surface area is 1.65 meters squared. General appearance: Alert, cooperative, in no acute distress Head: Normocephalic, atraumatic Eyes: Normal, EOM intact Oropharynx: Moist without lesions Neck: Supple, no tenderness Heart: Normal, regular rate and rhythm, without murmur Lungs: Clear to auscultation, good air exchange Abdomen: Soft, nondistended Ext: No edema in LE bilaterally, good distal pulses  Neurologic exam:  Mental status: alertness: alert, orientation: person, place, time, affect: normal Speech: fluent and clear Cranial nerves:  CN II-XII grossly intact XII: tongue strength symmetric  Motor: 3/5  bilateral HF and KE; 5/5 distally  Sensory: intact to light touch in all extremities Reflexes: 2+ and symmetric bilaterally for arms and legs Coordination: intact finger to nose Gait: untested   Laboratory: Results for orders placed or performed during the hospital encounter of 88/50/27  Basic metabolic panel  Result Value Ref Range   Sodium 138 135 - 145 mmol/L   Potassium 3.5 3.5 - 5.1  mmol/L   Chloride 102 98 - 111 mmol/L   CO2 26 22 - 32 mmol/L   Glucose, Bld 160 (H) 70 - 99 mg/dL   BUN 38 (H) 8 - 23 mg/dL   Creatinine, Ser 1.72 (H) 0.44 - 1.00 mg/dL   Calcium 9.0 8.9 - 10.3 mg/dL   GFR, Estimated 28 (L) >60 mL/min   Anion gap 10 5 - 15  CBC  Result Value Ref Range   WBC 12.0 (H) 4.0 - 10.5 K/uL   RBC 3.74 (L) 3.87 - 5.11 MIL/uL   Hemoglobin 11.7 (L) 12.0 - 15.0 g/dL   HCT 36.3 36.0 - 46.0 %   MCV 97.1 80.0 - 100.0 fL   MCH 31.3 26.0 - 34.0 pg   MCHC 32.2 30.0 - 36.0 g/dL   RDW 13.9 11.5 - 15.5 %   Platelets 240 150 - 400 K/uL   nRBC 0.0 0.0 - 0.2 %  Urinalysis, Routine w reflex microscopic  Result Value Ref Range   Color, Urine YELLOW (A) YELLOW   APPearance CLEAR (A) CLEAR   Specific Gravity, Urine 1.012 1.005 - 1.030   pH 7.0 5.0 - 8.0   Glucose, UA NEGATIVE NEGATIVE mg/dL   Hgb urine dipstick NEGATIVE NEGATIVE   Bilirubin Urine NEGATIVE NEGATIVE   Ketones, ur NEGATIVE NEGATIVE mg/dL   Protein, ur NEGATIVE NEGATIVE mg/dL   Nitrite NEGATIVE NEGATIVE   Leukocytes,Ua NEGATIVE NEGATIVE  Magnesium  Result Value Ref Range   Magnesium 2.0 1.7 - 2.4 mg/dL  CBC  Result Value Ref Range   WBC 12.0 (H) 4.0 - 10.5 K/uL   RBC 3.61 (L) 3.87 - 5.11 MIL/uL   Hemoglobin 11.0 (L) 12.0 - 15.0 g/dL   HCT 34.8 (L) 36.0 - 46.0 %   MCV 96.4 80.0 - 100.0 fL   MCH 30.5 26.0 - 34.0 pg   MCHC 31.6 30.0 - 36.0 g/dL   RDW 13.9 11.5 - 15.5 %   Platelets 222 150 - 400 K/uL   nRBC 0.0 0.0 - 0.2 %  Comprehensive metabolic panel  Result Value Ref Range   Sodium 139 135 - 145 mmol/L   Potassium 3.5 3.5 - 5.1 mmol/L   Chloride 104 98 - 111 mmol/L   CO2 29 22 - 32 mmol/L   Glucose, Bld 90 70 - 99 mg/dL   BUN 33 (H) 8 - 23 mg/dL   Creatinine, Ser 1.36 (H) 0.44 - 1.00 mg/dL   Calcium 8.7 (L) 8.9 - 10.3 mg/dL   Total Protein 5.8 (L) 6.5 - 8.1 g/dL   Albumin 2.6 (L) 3.5 - 5.0 g/dL   AST 18 15 - 41 U/L   ALT 19 0 - 44 U/L   Alkaline Phosphatase 87 38 - 126 U/L   Total  Bilirubin 0.7 0.3 - 1.2 mg/dL   GFR, Estimated 38 (L) >60 mL/min   Anion gap 6 5 - 15  TSH  Result Value Ref Range   TSH 0.453 0.350 - 4.500 uIU/mL    Imaging:  I personally reviewed radiology studies to include:  09/06/21 MRI L  spine  IMPRESSION: 1. Bilateral sacral insufficiency fractures, more completely covered on the right where there is marrow edema from acute or subacute timing. 2. Lumbar spine degeneration with hyperlordosis and mild scoliosis. 3. L2-3 right subarticular recess stenosis impinging on the right L3 nerve root.     Electronically Signed   By: Jorje Guild M.D.   On: 09/06/2021 21:04  CT L spine 10/15/21 IMPRESSION: 1. Acute to subacute compression fracture involving the superior endplate of L2 with up to 40% height loss without significant bony retropulsion. 2. Evolving bilateral sacral insufficiency fractures, right greater than left. 3. Levoscoliosis with underlying multilevel degenerative spondylosis and facet arthrosis, grossly similar to recent MRI from 09/06/2021.   Aortic Atherosclerosis (ICD10-I70.0).     Electronically Signed   By: Jeannine Boga M.D.   On: 10/16/2021 00:02  Impression/Plan:     1.  Diagnosis: L2 compression deformity and sacral fractures   2.  Plan  - LSO brace - consider upright lumbar xrays  - consider consulting IR for consideration of kyphoplasty and/or sacroplasty. I suspect that since her symptoms were present prior to her L2 fracture that her sacral fractures are a larger contributory factor to her pain.  - No plan for acute neurosurgical intervention at this time  - Please call with any questions or concerns.  Cooper Render PA-C

## 2021-10-16 NOTE — Progress Notes (Signed)
PT Cancellation Note  Patient Details Name: MADESYN AST MRN: [id number] DOB: 11-21-1932   Cancelled Treatment:    Reason Eval/Treat Not Completed: Other (comment) Patient is awaiting LSO recommended by neurosurgery for L2 fracture. PT to follow up as brace arrives when appropriate.   Minna Merritts, PT, MPT  Percell Locus 10/16/2021, 1:44 PM

## 2021-10-16 NOTE — Assessment & Plan Note (Signed)
-   Continue Norvasc - Continue to monitor 

## 2021-10-16 NOTE — H&P (Signed)
History and Physical    Patient: Amy Hensley YFV:[id number] DOB: 1932/08/05 DOA: 10/15/2021 DOS: the patient was seen and examined on 10/16/2021 PCP: Adin Hector, MD  Patient coming from: Home  Chief Complaint:  Chief Complaint  Patient presents with   Weakness   Back Pain   HPI: Amy Hensley is a 89 y.o. female with medical history significant of giant cell arteritis, chronic back pain, hypertension, hypothyroidism, CKD stage IIIb, and more presents the ED with a chief complaint of back pain.  Patient reports that she has overall low back pain.  It all started about a week after she started taking prednisone for giant cell arteritis.  That was back in December or January.  Patient reports that the pain is associated with the prednisone.  However she also states that when she tries to taper down the prednisone her back pain is worse.  Patient notes that standing and any movement of her lower extremities provokes the pain.  Resting in a seated position makes the pain better, but laying position is the best.  Patient was last discharged May 18 and went to rehab.  She reports she was doing well in rehab so she was discharged home 2 days ago.  Since she has been home she has had deterioration.  She lives with her husband but he is not strong to hold her up so her son, who is at bedside, has been trying to come over to move around.  He reports that she needs about 95% assistance with her effort to move.  Patient describes the pain as sharp and shooting through her body.  It waxes and wanes.  Changes in position can help it or make it worse.  She thinks she has had leg weakness in addition to the pain.  She denies any numbness or burning in her legs.  She does not have any asymmetrical weakness.  Patient reports her last fall was a couple days ago in the bathroom.  She did not hurt herself because she felt like her legs went out so she lowered herself to a seated position on the floor.  She then  could not get up and had to call EMTs.  Son at bedside also reports patient has had feet swelling.  He reports it is worse since she has been immobile.  Since her back injury she has been just sitting in a chair.  He notes that her pain is worse in the morning and better at night.  At home she takes codeine and Lyrica.  Patient has no other complaints at this time.  Patient, does not drink alcohol, does not use illicit drugs.  She is vaccinated for COVID.  Patient is full code. Review of Systems: As mentioned in the history of present illness. All other systems reviewed and are negative. Past Medical History:  Diagnosis Date   Arthritis    Chronic kidney disease    stage 3   Hypertension    Hypothyroidism    Past Surgical History:  Procedure Laterality Date   ABDOMINAL HYSTERECTOMY     ABDOMINAL SURGERY     tacked up uterus   APPENDECTOMY     ARTERY BIOPSY N/A 05/19/2021   Procedure: BIOPSY TEMPORAL ARTERY;  Surgeon: Herbert Pun, MD;  Location: [facility] ORS;  Service: General;  Laterality: N/A;   CATARACT EXTRACTION W/PHACO Left 10/04/2016   Procedure: CATARACT EXTRACTION PHACO AND INTRAOCULAR LENS PLACEMENT ([place]);  Surgeon: Eulogio Bear, MD;  Location: [facility]  ORS;  Service: Ophthalmology;  Laterality: Left;  us02:17.8 ap%19.6 cde27.31 fluid lot # [id number]   CESAREAN SECTION     EYE SURGERY     INGUINAL HERNIA REPAIR Right 12/08/2018   Procedure: OPEN RIGHT HERNIA REPAIR INGUINAL ADULT;  Surgeon: Herbert Pun, MD;  Location: [facility] ORS;  Service: General;  Laterality: Right;   IR INJECT/THERA/INC NEEDLE/CATH/PLC EPI/LUMB/SAC W/IMG  09/26/2021   Social History:  reports that she has quit smoking. Her smoking use included cigarettes. She smoked an average of 1 pack per day. She has never used smokeless tobacco. She reports that she does not drink alcohol and does not use drugs.  Allergies  Allergen Reactions   Alendronate Other (See Comments)    Dyspepsia (indigestion)     Buspirone Other (See Comments)    sedation    History reviewed. No pertinent family history.  Prior to Admission medications   Medication Sig Start Date End Date Taking? Authorizing Provider  amLODipine (NORVASC) 10 MG tablet Take 10 mg by mouth daily.   Yes [provider]  Calcium Carb-Cholecalciferol 600-800 MG-UNIT TABS Take 1 tablet by mouth daily.   Yes [provider]  Cholecalciferol (VITAMIN D3 PO) Take 1 tablet by mouth daily.   Yes [provider]  codeine 15 MG tablet Take 15 mg by mouth every 6 (six) hours as needed.   Yes [provider]  esomeprazole (NEXIUM) 20 MG capsule Take 20 mg by mouth every morning. 05/18/21 05/18/22 Yes [provider]  furosemide (LASIX) 20 MG tablet Take 20 mg by mouth daily.   Yes [provider]  levothyroxine (SYNTHROID) 50 MCG tablet Take 50 mcg by mouth daily before breakfast.  07/09/16  Yes [provider]  magnesium oxide (MAG-OX) 400 MG tablet Take 400 mg by mouth daily.   Yes [provider]  predniSONE (DELTASONE) 50 MG tablet Take 0.5 tablets (25 mg total) by mouth daily with breakfast. 09/28/21  Yes Kc, Ramesh, MD  pregabalin (LYRICA) 25 MG capsule Take 25 mg by mouth 2 (two) times daily.   Yes [provider]  acetaminophen (TYLENOL) 325 MG tablet Take 650 mg by mouth every 6 (six) hours as needed.    [provider]  oxyCODONE (OXY IR/ROXICODONE) 5 MG immediate release tablet Take 1 tablet (5 mg total) by mouth every 8 (eight) hours as needed for up to 10 doses for moderate pain. Patient not taking: Reported on 10/16/2021 09/28/21   Antonieta Pert, MD    Physical Exam: Vitals:   10/15/21 2209 10/16/21 0100 10/16/21 0300 10/16/21 0330  BP: 137/62 129/69 136/78 123/69  Pulse: 99 89 89 88  Resp: '14 10 17 10  '$ Temp: 98.4 F (36.9 C)     TempSrc: Oral     SpO2: 93% 91% 94% 91%  Weight:      Height:       1.  General: Patient lying supine in bed,  no  acute distress   2. Psychiatric: Alert and oriented x 3, mood and behavior normal for situation, pleasant and cooperative with exam   3. Neurologic: Speech and language are normal, face is symmetric, moves all 4 extremities voluntarily, at baseline without acute deficits on limited exam   4. HEENMT:  Head is atraumatic, normocephalic, pupils reactive to light, neck is supple, trachea is midline, mucous membranes are moist   5. Respiratory : Lungs are clear to auscultation bilaterally without wheezing, rhonchi, rales, no cyanosis, no increase in work of breathing or  accessory muscle use   6. Cardiovascular : Heart rate normal, rhythm is regular, no murmurs, rubs or gallops, no peripheral edema, peripheral pulses palpated   7. Gastrointestinal:  Abdomen is soft, nondistended, nontender to palpation bowel sounds active, no masses or organomegaly palpated   8. Skin:  Skin is warm, dry and intact without rashes, acute lesions, or ulcers on limited exam   9.Musculoskeletal:  Resistance to either of the upper extremities or lower extremities causes the pain to be worse  Data Reviewed: In the ED Temp 98.4, heart rate 89 respiratory rate 10-14, blood pressure 129/69, satting at 91% Leukocytosis 12.0, hemoglobin 11.7, platelets 240 Chemistry reveals an elevated creatinine very near to patient's baseline 1.7 to Tylenol, Norvasc, Synthroid, Protonix, prednisone all given in the ED Discharge summary from previous admission reviewed Admission requested for placement  Assessment and Plan: * Intractable pain - Due to back pain -Was doing better when in rehab but has not been able to tolerate being at home -[facility] consulted for placement -PT eval and treat -Pain scale at Tylenol, oxycodone, morphine -Continue to monitor  Ambulatory dysfunction - With recent fall, and intractable back pain -PT eval and treat -Will likely need placement -TOC consulted  Leukocytosis - Likely related to  chronic steroid use -Current white blood cell count 12.0, better than previous admission -No infectious signs or symptoms -Trend in the a.m.  Long term (current) use of systemic steroids - Patient currently trying to wean off of prednisone -She is tapering off with her last dose being 25 mg daily, current dose 20 mg daily Continue 20 mg daily -Continue to monitor  Hypothyroidism - Continue Synthroid -Check TSH  Hypertension - Continue Norvasc -Continue to monitor  Stage 3b chronic kidney disease ([place]) - Known chronic kidney disease with that in very near baseline.  Baseline 1.4-1.6, today creatinine 1.72 -Avoid use of nephrotoxic agents when possible -Trend in the a.m.  Hepatitis C antibody positive in blood - Patient completed 12-week course of Epclusa May 2023      Advance Care Planning:   Code Status: Prior full  Consults: TOC for placement  Family Communication: Son at bedside  Severity of Illness: The appropriate patient status for this patient is INPATIENT. Inpatient status is judged to be reasonable and necessary in order to provide the required intensity of service to ensure the patient's safety. The patient's presenting symptoms, physical exam findings, and initial radiographic and laboratory data in the context of their chronic comorbidities is felt to place them at high risk for further clinical deterioration. Furthermore, it is not anticipated that the patient will be medically stable for discharge from the hospital within 2 midnights of admission.   * I certify that at the point of admission it is my clinical judgment that the patient will require inpatient hospital care spanning beyond 2 midnights from the point of admission due to high intensity of service, high risk for further deterioration and high frequency of surveillance required.*  Author: Rolla Plate, DO 10/16/2021 4:59 AM  For on call review [email or web address].

## 2021-10-16 NOTE — Assessment & Plan Note (Signed)
-   Patient completed 12-week course of Epclusa May 2023

## 2021-10-16 NOTE — Assessment & Plan Note (Signed)
-   Likely related to chronic steroid use -Current white blood cell count 12.0, better than previous admission -No infectious signs or symptoms -Trend in the a.m.

## 2021-10-16 NOTE — Assessment & Plan Note (Deleted)
-   Due to back pain -Was doing better when in rehab but has not been able to tolerate being at home -[facility] consulted for placement -PT eval and treat -Pain scale at Tylenol, oxycodone, morphine -Continue to monitor

## 2021-10-17 ENCOUNTER — Encounter: Payer: Self-pay | Admitting: Internal Medicine

## 2021-10-17 DIAGNOSIS — S32000A Wedge compression fracture of unspecified lumbar vertebra, initial encounter for closed fracture: Secondary | ICD-10-CM | POA: Diagnosis not present

## 2021-10-17 MED ORDER — CEFAZOLIN SODIUM-DEXTROSE 2-4 GM/100ML-% IV SOLN: 2.0000 g | INTRAVENOUS | Status: DC

## 2021-10-17 MED ORDER — CEFAZOLIN SODIUM-DEXTROSE 2-4 GM/100ML-% IV SOLN: 2.0000 g | INTRAVENOUS | Status: AC

## 2021-10-17 MED ORDER — HEPARIN SODIUM (PORCINE) 5000 UNIT/ML IJ SOLN
5000.0000 [IU] | Freq: Three times a day (TID) | INTRAMUSCULAR | Status: DC
  Administered 2021-10-18 – 2021-10-20 (×5): 5000 [IU] via SUBCUTANEOUS
  Filled 2021-10-17 (×5): qty 1

## 2021-10-17 NOTE — Plan of Care (Signed)

## 2021-10-17 NOTE — Assessment & Plan Note (Signed)
Secondary to steroids.  Monitor.

## 2021-10-17 NOTE — Assessment & Plan Note (Signed)
Presents with progressively worsening ambulatory status due to lower back pain. Work-up in the ER shows acute L2 compression fracture. Neurosurgery consulted recommend IR guided kyphoplasty. Continue pain control and PT OT. Continue LSO brace. Patient scheduled for kyphoplasty on 6/7.  Appreciate IR assistance.

## 2021-10-17 NOTE — Assessment & Plan Note (Signed)
Patient chronically on prednisone increasing the risk for osteoporosis. Currently being tapered.  Monitor.

## 2021-10-17 NOTE — Assessment & Plan Note (Signed)
Baseline serum creatinine around 1.4.  On presentation serum creatinine around 1.7.  Currently improving with IV hydration to 1.3.  Monitor.

## 2021-10-17 NOTE — Consult Note (Signed)
Chief Complaint: Patient was seen in consultation today for back pain at the request of Lavina Hamman, MD  Referring Physician(s): Lavina Hamman, MD  Supervising Physician: Juliet Rude  Patient Status: [place] - In-pt  History of Present Illness: Amy Hensley is a 88 y.o. female with PMHx significant for giant cell arteritis on chronic prednisone trying to taper off, chronic back pain and right L3 radiculopathy s/p right L3 nerve root block performed by IR 5/16, HTN, CKD stage IIIb. The patient also complains of frequent kidney infections and urinary incontinence x 6 months, she denies any new urinary or bowel changes. The patient was recently discharged from the hospital and was at rehab until last Friday when she was discharged from rehab. She states her back pain was controlled upon discharge from rehab last Friday, she has complained of continued weakness in her legs bilaterally R > L and fell x 2 at home since rehab discharge. She is now presenting to ED 6/5 with new lower back pain that is different from chronic pain since her falls. CT imaging done 6/5 with evidence of acute L2 compression fracture- compared to recent MR Lumbar spine done 4/26. Patient has maximum tenderness at L2 region and states she wants to have better pain control in this region so that she can continue to work with PT/OT on her leg strength. IR has been consulted for possible Kyphoplasty.   The patient complains of ongoing lower back pain that is uncontrolled with pain medication with any movement and is unable to use her walker as she was previously able to do secondary to sharp pain. She complains of ongoing bilateral leg weakness R>L and is unsure if the recent right sided L3 nerve root block helped. She denies any current chest pain or shortness of breath. She denies any current blood thinner use, denies any known bleeding or clotting disorder. The patient denies any recent infections, fever or chills.  The patient denies any history of sleep apnea or chronic oxygen use. She has no known complications to sedation.    Past Medical History:  Diagnosis Date   Arthritis    Chronic kidney disease    stage 3   Hypertension    Hypothyroidism     Past Surgical History:  Procedure Laterality Date   ABDOMINAL HYSTERECTOMY     ABDOMINAL SURGERY     tacked up uterus   APPENDECTOMY     ARTERY BIOPSY N/A 05/19/2021   Procedure: BIOPSY TEMPORAL ARTERY;  Surgeon: Herbert Pun, MD;  Location: [facility] ORS;  Service: General;  Laterality: N/A;   CATARACT EXTRACTION W/PHACO Left 10/04/2016   Procedure: CATARACT EXTRACTION PHACO AND INTRAOCULAR LENS PLACEMENT ([place]);  Surgeon: Eulogio Bear, MD;  Location: [facility] ORS;  Service: Ophthalmology;  Laterality: Left;  [id number]:17.8 ap%19.6 cde27.31 fluid lot # [id number]   CESAREAN SECTION     EYE SURGERY     INGUINAL HERNIA REPAIR Right 12/08/2018   Procedure: OPEN RIGHT HERNIA REPAIR INGUINAL ADULT;  Surgeon: Herbert Pun, MD;  Location: [facility] ORS;  Service: General;  Laterality: Right;   IR INJECT/THERA/INC NEEDLE/CATH/PLC EPI/LUMB/SAC W/IMG  09/26/2021    Allergies: Alendronate and Buspirone  Medications: Prior to Admission medications   Medication Sig Start Date End Date Taking? Authorizing Provider  amLODipine (NORVASC) 10 MG tablet Take 10 mg by mouth daily.   Yes [provider]  Calcium Carb-Cholecalciferol 600-800 MG-UNIT TABS Take 1 tablet by mouth daily.   Yes [provider]  Cholecalciferol (VITAMIN D3 PO) Take 1 tablet by mouth daily.   Yes [provider]  codeine 15 MG tablet Take 15 mg by mouth every 6 (six) hours as needed.   Yes [provider]  esomeprazole (NEXIUM) 20 MG capsule Take 20 mg by mouth every morning. 05/18/21 05/18/22 Yes [provider]  furosemide (LASIX) 20 MG tablet Take 20 mg by mouth daily.   Yes [provider]  levothyroxine (SYNTHROID) 50 MCG tablet  Take 50 mcg by mouth daily before breakfast.  07/09/16  Yes [provider]  magnesium oxide (MAG-OX) 400 MG tablet Take 400 mg by mouth daily.   Yes [provider]  predniSONE (DELTASONE) 50 MG tablet Take 0.5 tablets (25 mg total) by mouth daily with breakfast. 09/28/21  Yes Kc, Ramesh, MD  pregabalin (LYRICA) 25 MG capsule Take 25 mg by mouth 2 (two) times daily.   Yes [provider]  acetaminophen (TYLENOL) 325 MG tablet Take 650 mg by mouth every 6 (six) hours as needed.    [provider]  oxyCODONE (OXY IR/ROXICODONE) 5 MG immediate release tablet Take 1 tablet (5 mg total) by mouth every 8 (eight) hours as needed for up to 10 doses for moderate pain. Patient not taking: Reported on 10/16/2021 09/28/21   Antonieta Pert, MD    History reviewed. No pertinent family history.  Social History   Socioeconomic History   Marital status: Married    Spouse name: Not on file   Number of children: Not on file   Years of education: Not on file   Highest education level: Not on file  Occupational History   Not on file  Tobacco Use   Smoking status: Former    Packs/day: 1.00    Types: Cigarettes   Smokeless tobacco: Never   Tobacco comments:    Quit 2022  Vaping Use   Vaping Use: Never used  Substance and Sexual Activity   Alcohol use: No   Drug use: No   Sexual activity: Not on file  Other Topics Concern   Not on file  Social History Narrative   Not on file   Social Determinants of Health   Financial Resource Strain: Not on file  Food Insecurity: Not on file  Transportation Needs: Not on file  Physical Activity: Not on file  Stress: Not on file  Social Connections: Not on file   Review of Systems: A 12 point ROS discussed and pertinent positives are indicated in the HPI above.  All other systems are negative.  Review of Systems  Vital Signs: BP 134/78 (BP Location: Left Arm)   Pulse 82   Temp 97.8 F (36.6 C)   Resp 16   Ht '5\' 4"'$  (1.626  m)   Wt 132 lb 4.4 oz (60 kg)   SpO2 92%   BMI 22.71 kg/m   Physical Exam Constitutional:      Appearance: Normal appearance.  HENT:     Head: Normocephalic and atraumatic.  Cardiovascular:     Rate and Rhythm: Normal rate and regular rhythm.  Pulmonary:     Effort: Pulmonary effort is normal. No respiratory distress.  Abdominal:     General: There is no distension.     Palpations: Abdomen is soft.     Tenderness: There is no abdominal tenderness.  Musculoskeletal:        General: Tenderness present.     Comments: L2 region TTP, no skin changes, no skin breakdown or signs of infection  at location  Skin:    General: Skin is warm and dry.  Neurological:     Mental Status: She is alert and oriented to person, place, and time.     Comments: Bilateral LE plantarflexion and dorsiflexion intact with equal strength 4/5, sensation intact    Imaging: CT Lumbar Spine Wo Contrast  Result Date: 10/16/2021 CLINICAL DATA:  Initial evaluation for acute low back pain, trauma. EXAM: CT LUMBAR SPINE WITHOUT CONTRAST TECHNIQUE: Multidetector CT imaging of the lumbar spine was performed without intravenous contrast administration. Multiplanar CT image reconstructions were also generated. RADIATION DOSE REDUCTION: This exam was performed according to the departmental dose-optimization program which includes automated exposure control, adjustment of the mA and/or kV according to patient size and/or use of iterative reconstruction technique. COMPARISON:  MRI from 09/06/2021. FINDINGS: Segmentation: Transitional features seen about the lumbosacral junction with partial sacralization of the L5 vertebral body. Alignment: Levoscoliosis.  No interval listhesis. Vertebrae: Acute to subacute compression fracture seen involving the superior endplate of L2. Associated height loss measures up to 40% without significant bony retropulsion. Endplate sclerosis at the superior endplate of L4 without definite fracture,  likely chronic. Evolving bilateral sacral insufficiency fractures, right greater than left, seen on prior MRI. No other acute fracture.  No discrete or worrisome osseous lesions. Paraspinal and other soft tissues: Paraspinous soft tissues demonstrate no acute finding. Chronic fatty atrophy noted within the posterior paraspinous musculature. Asymmetric left renal atrophy. Moderate aorto bi-iliac atherosclerotic disease. Disc levels: Underlying moderate degenerative spondylosis at L2-3 and L3-4. Multilevel facet hypertrophy, greater on the left. Overall appearance is grossly similar to previous MRI. IMPRESSION: 1. Acute to subacute compression fracture involving the superior endplate of L2 with up to 40% height loss without significant bony retropulsion. 2. Evolving bilateral sacral insufficiency fractures, right greater than left. 3. Levoscoliosis with underlying multilevel degenerative spondylosis and facet arthrosis, grossly similar to recent MRI from 09/06/2021. Aortic Atherosclerosis (ICD10-I70.0). Electronically Signed   By: Jeannine Boga M.D.   On: 10/16/2021 00:02   US Venous Img Lower Bilateral (DVT)  Result Date: 10/16/2021 CLINICAL DATA:  9965; edema EXAM: Bilateral LOWER EXTREMITY VENOUS DOPPLER ULTRASOUND TECHNIQUE: Gray-scale sonography with compression, as well as color and duplex ultrasound, were performed to evaluate the deep venous system(s) from the level of the common femoral vein through the popliteal and proximal calf veins. COMPARISON:  None Available. FINDINGS: VENOUS Normal compressibility of the common femoral, superficial femoral, and popliteal veins, as well as the visualized calf veins. Visualized portions of profunda femoral vein and great saphenous vein unremarkable. No filling defects to suggest DVT on grayscale or color Doppler imaging. Doppler waveforms show normal direction of venous flow, normal respiratory plasticity and response to augmentation. Limited views of the  contralateral common femoral vein are unremarkable. OTHER None. Limitations: none IMPRESSION: Negative. Electronically Signed   By: Valentino Saxon M.D.   On: 10/16/2021 08:19   IR INJECT DIAG/THERA/INC NEEDLE/CATH/PLC EPI/LUMB/SAC W/IMG  Result Date: 09/26/2021 CLINICAL DATA:  88 year old female with lumbosacral spondylosis without myelopathy. She has a significant right L3 radiculopathy which has been recalcitrant to conservative management. She presents for right L3 nerve root block and transforaminal epidural steroid injection. EXAM: IR INJECT/THERA/INC NEEDLE/CATH/PLC EPI/LUMB/SAC W/IMG FLUOROSCOPY TIME:  Radiation Exposure Index (as provided by the fluoroscopic device): 19.6 mGy Kerma PROCEDURE: The procedure, risks, benefits, and alternatives were explained to the patient. Questions regarding the procedure were encouraged and answered. The patient understands and consents to the procedure. RIGHT L3 NERVE ROOT BLOCK  AND TRANSFORAMINAL EPIDURAL: A posterior oblique approach was taken to the intervertebral foramen on the right at L3 using a curved 22 gauge spinal needle. Injection of Omnipaque 180 outlined the L3 nerve root and showed good epidural spread. No vascular opacification is seen. Eighty mg of Depo-Medrol mixed with 2 mL 1% lidocaine were instilled. The procedure was well-tolerated, and the patient was discharged thirty minutes following the injection in good condition. COMPLICATIONS: None IMPRESSION: Technically successful injection consisting of a right L3 nerve root block and transforaminal epidural. Electronically Signed   By: Jacqulynn Cadet M.D.   On: 09/26/2021 16:43    Labs:  CBC: Recent Labs    09/25/21 1812 09/27/21 0926 10/15/21 2206 10/16/21 0514  WBC 16.0* 17.1* 12.0* 12.0*  HGB 13.9 12.7 11.7* 11.0*  HCT 41.4 38.3 36.3 34.8*  PLT 346 310 240 222    COAGS: Recent Labs    06/08/21 1237  INR 0.9  APTT 21*    BMP: Recent Labs    09/25/21 1812  09/27/21 0926 10/15/21 2206 10/16/21 0514  NA 132* 135 138 139  K 4.3 3.7 3.5 3.5  CL 95* 99 102 104  CO2 '25 24 26 29  '$ GLUCOSE 135* 134* 160* 90  BUN 33* 40* 38* 33*  CALCIUM 10.0 9.5 9.0 8.7*  CREATININE 1.54* 1.63* 1.72* 1.36*  GFRNONAA 32* 30* 28* 38*    LIVER FUNCTION TESTS: Recent Labs    06/01/21 1106 08/10/21 1158 10/16/21 0514  BILITOT 0.6 0.7 0.7  AST '28 22 18  '$ ALT '27 20 19  '$ ALKPHOS 83 81 87  PROT 7.7 7.9 5.8*  ALBUMIN 3.8 3.6 2.6*   Assessment and Plan: This is a 88 year old female with PMHx significant for giant cell arteritis on chronic prednisone trying to taper off, chronic back pain and right L3 radiculopathy s/p Right L3 nerve root block performed by IR 5/16, HTN, CKD stage IIIb. The patient also complains of frequent kidney infections and urinary incontinence x 6 months, she denies any new urinary or bowel changes. The patient was recently discharged from the hospital and was at rehab until last Friday when she was discharged from rehab. She states her back pain was controlled upon discharge from rehab last Friday, she has complained of continued weakness in her legs bilaterally R > L and fell x 2 at home since rehab discharge. She is now presenting to ED 6/5 with new lower back pain that is different from chronic pain since her falls. CT imaging done 6/5 with evidence of acute L2 compression fracture- compared to recent MR Lumbar spine done 4/26. Patient has maximum tenderness at L2 region and states she wants to have better pain control in this region so that she can continue to work with PT/OT on her leg strength.   The patient has not been on any blood thinners, no current concern for infection, imaging, labs and vitals have been reviewed. Discussed patient's urinary incontinence that has been ongoing for 6 months or more with neurosurgery and they do not feel cord compression is of high concern or need for urgent neurosurgical intervention.   Risks and benefits  of image guided L2 Kyphoplasty with moderate sedation were discussed with the patient including, but not limited to education regarding the natural healing process of compression fractures without intervention, bleeding, infection, cement migration which may cause spinal cord damage or paralysis. The patient understands this procedure will be treating L2 region acute pain only and will not change her  chronic pain or her chronic leg weakness.   This interventional procedure involves the use of X-rays and because of the nature of the planned procedure, it is possible that we will have prolonged use of X-ray fluoroscopy.  Potential radiation risks to you include (but are not limited to) the following: - A slightly elevated risk for cancer  several years later in life. This risk is typically less than 0.5% percent. This risk is low in comparison to the normal incidence of human cancer, which is 33% for women and 50% for men according to the [place]. - Radiation induced injury can include skin redness, resembling a rash, tissue breakdown / ulcers and hair loss (which can be temporary or permanent).   The likelihood of either of these occurring depends on the difficulty of the procedure and whether you are sensitive to radiation due to previous procedures, disease, or genetic conditions.   IF your procedure requires a prolonged use of radiation, you will be notified and given written instructions for further action.  It is your responsibility to monitor the irradiated area for the 2 weeks following the procedure and to notify your physician if you are concerned that you have suffered a radiation induced injury.    All of the patient's questions were answered, patient and her family are agreeable to proceed. We will schedule patient's procedure pending insurance pre-authorization.  Consent signed and in IR.   Thank you for this interesting consult.  I greatly enjoyed meeting Amy Hensley and look forward to participating in their care.  A copy of this report was sent to the requesting provider on this date.  Electronically Signed: Hedy Jacob, PA-C 10/17/2021, 9:09 AM   I spent a total of 40 Minutes in face to face in clinical consultation, greater than 50% of which was counseling/coordinating care for acute on chronic back pain, new symptomatic L2 compression fracture.

## 2021-10-17 NOTE — Progress Notes (Signed)
  Progress Note Patient: Amy Hensley LEX:[id number] DOB: [birth year] DOA: 10/15/2021  DOS: the patient was seen and examined on 10/17/2021  Brief hospital course: 88 year old female with past medical history of HTN, hypothyroidism, CKD 3B, chronic back pain and recurrent fall, [facility] presents to the hospital with complaints of ongoing pain for last 2 weeks after her fall found to have acute L2 compression fracture. Currently undergoing kyphoplasty on 6/7. Neurosurgery and IR both on board. Assessment and Plan: * Lumbar compression fracture, closed, initial encounter ([place]) Presents with progressively worsening ambulatory status due to lower back pain. Work-up in the ER shows acute L2 compression fracture. Neurosurgery consulted recommend IR guided kyphoplasty. Continue pain control and PT OT. Continue LSO brace. Patient scheduled for kyphoplasty on 6/7.  Appreciate IR assistance.  Giant cell arteritis (HCC) Patient chronically on prednisone increasing the risk for osteoporosis. Currently being tapered.  Monitor.  Acute renal failure superimposed on stage 3b chronic kidney disease (HCC) Baseline serum creatinine around 1.4.  On presentation serum creatinine around 1.7.  Currently improving with IV hydration to 1.3.  Monitor.  Leukocytosis Secondary to steroids.  Monitor.  Hypothyroidism - Continue Synthroid  Hypertension - Continue Norvasc -Continue to monitor  Ambulatory dysfunction PT OT recommends SNF. Family unable to provide care.  Hepatitis C antibody positive in blood - Patient completed 12-week course of Epclusa May 2023  Subjective: Continues to have pain.  No nausea no vomiting.  No fever no chills.  Has some constipation but this has resolved.  Tolerating oral diet.  Physical Exam: Vitals:   10/17/21 0545 10/17/21 0816 10/17/21 1120 10/17/21 1719  BP: 121/63 134/78 118/64 124/66  Pulse: 78 82 84 88  Resp: '16 16 16 15  '$ Temp: 97.9 F (36.6 C) 97.8 F (36.6 C) 98 F  (36.7 C) 97.9 F (36.6 C)  TempSrc:      SpO2: 92% 92% 96% 97%  Weight:      Height:       General: Appear in mild distress; no visible Abnormal Neck Mass Or lumps, Conjunctiva normal Cardiovascular: S1 and S2 Present, no Murmur, Respiratory: good respiratory effort, Bilateral Air entry present and CTA, no Crackles, no wheezes Abdomen: Bowel Sound present, Non tender  Extremities: no Pedal edema Neurology: alert and oriented to time, place, and person  Gait not checked due to patient safety concerns   Data Reviewed: I have Reviewed nursing notes, Vitals, and Lab results since pt's last encounter. Pertinent lab results none I have ordered test including CBC and BMP I have reviewed the last note from neurosurgery,  I have discussed pt's care plan and test results with IR.   Family Communication: Multiple family members at bedside  Disposition: Status is: Inpatient Remains inpatient appropriate because: Requiring pain control as well as hypoglossal tomorrow.  Author: Berle Mull, MD 10/17/2021 7:35 PM  Please look on [email or web address] to find out who is on call.

## 2021-10-17 NOTE — Assessment & Plan Note (Signed)
PT OT recommends SNF. Family unable to provide care.

## 2021-10-17 NOTE — Evaluation (Signed)
Physical Therapy Evaluation Patient Details Name: Amy Hensley MRN: [id number] DOB: 15-Oct-1932 Today's Date: 10/17/2021  History of Present Illness  88 y.o. female with medical history significant for HTN, CKD 3, hep C, giant cell arteritis, lumbar radiculopathy, history of recent hospitalization after fall with sacral insufficiency fractures.  She went to rehab and after 2 days at home returns to [facility] with yet another fall and increased back pain, new onset L2 compression fx. Now again with worsening of her chronic low back pain with radiation down into LEs.  MD assessment includes: Intractable back pain, abmulatory dysfunction, leukocytosis, and lumbar radiculopathy.  Clinical Impression  Pt pleasant and motivated t/o the session, ultimately she is far from her baseline and after being home just 2 days and having another fall she and her husband do not feel good about her ability to manage at home.  She did do relatively well with slow and guarded mobility and ~15 ft worth of walking in the room (RW, LSO).  She did not c/o severe pain today but with low grade guarding t/o all mobility acts.  Pt will nee STR once medically cleared for d/c, family/pt with many questions about plan of care, kypho, etc - care team notified.     Recommendations for follow up therapy are one component of a multi-disciplinary discharge planning process, led by the attending physician.  Recommendations may be updated based on patient status, additional functional criteria and insurance authorization.  Follow Up Recommendations Skilled nursing-short term rehab (<3 hours/day)    Assistance Recommended at Discharge Frequent or constant Supervision/Assistance  Patient can return home with the following  A lot of help with walking and/or transfers;A little help with bathing/dressing/bathroom;Help with stairs or ramp for entrance;Assist for transportation    Equipment Recommendations Rolling walker (2 wheels);BSC/3in1   Recommendations for Other Services       Functional Status Assessment Patient has had a recent decline in their functional status and demonstrates the ability to make significant improvements in function in a reasonable and predictable amount of time.     Precautions / Restrictions Precautions Precautions: Fall Required Braces or Orthoses: Spinal Brace Spinal Brace: Lumbar corset Restrictions Weight Bearing Restrictions: No      Mobility  Bed Mobility Overal bed mobility: Needs Assistance Bed Mobility: Rolling, Sidelying to Sit Rolling: Min guard Sidelying to sit: Min assist       General bed mobility comments: Pt was able to slowly, but effectively use log roll technique to get to side and then with heavy UE use of rails and much cuing for sequencing she was able to rise to sitting EOB with only very light guidance/assist    Transfers Overall transfer level: Needs assistance Equipment used: Rolling walker (2 wheels) Transfers: Sit to/from Stand Sit to Stand: Min guard           General transfer comment: Pt unble to rise with pushing up from standard height bed, with PT holding walker steady she was able to get her weight over the walker and attain standing with heavy UE use but only CGA assist (and plenty of cuing)    Ambulation/Gait Ambulation/Gait assistance: Min guard Gait Distance (Feet): 15 Feet Assistive device: Rolling walker (2 wheels)         General Gait Details: Pt able to do small loop in room with very slow, cautious gait.  She had no LOBs but did endorse feeling at though she was leaning backward, cues for appropriate forward weight shift to RW  Stairs            Wheelchair Mobility    Modified Rankin (Stroke Patients Only)       Balance Overall balance assessment: Needs assistance   Sitting balance-Leahy Scale: Good     Standing balance support: Bilateral upper extremity supported, During functional activity Standing  balance-Leahy Scale: Fair Standing balance comment: reliant on walker but no LOBs during ambulation                             Pertinent Vitals/Pain Pain Assessment Pain Assessment: Faces Faces Pain Scale: Hurts little more Pain Location: low back and RLE    Home Living Family/patient expects to be discharged to:: Skilled nursing facility Living Arrangements: Spouse/significant other Available Help at Discharge: Family;Available 24 hours/day Type of Home: House Home Access: Stairs to enter Entrance Stairs-Rails: Left Entrance Stairs-Number of Steps: 3   Home Layout: One level Home Equipment: Air cabin crew (4 wheels);Cane - single point;Grab bars - tub/shower;Rolling Walker (2 wheels) Additional Comments: Pt lives with spouse who has medical issues, ambulates with a rollator, and stated (repeatedly and in no-uncertain-terms) he is not able to provide significant physical assist to patient    Prior Function Prior Level of Function : Needs assist             Mobility Comments: Until last month apparently doing community distances with either a rollator or SPC, has had multiple falls in the last 6 months - which is new for her       Hand Dominance        Extremity/Trunk Assessment   Upper Extremity Assessment Upper Extremity Assessment: Generalized weakness (age appropriate deficits)    Lower Extremity Assessment Lower Extremity Assessment: Generalized weakness       Communication   Communication: No difficulties  Cognition Arousal/Alertness: Awake/alert Behavior During Therapy: WFL for tasks assessed/performed Overall Cognitive Status: Within Functional Limits for tasks assessed                                          General Comments      Exercises     Assessment/Plan    PT Assessment Patient needs continued PT services  PT Problem List Decreased strength;Decreased mobility;Decreased balance;Decreased knowledge of  use of DME;Pain       PT Treatment Interventions DME instruction;Gait training;Stair training;Functional mobility training;Therapeutic activities;Therapeutic exercise;Balance training;Patient/family education    PT Goals (Current goals can be found in the Care Plan section)  Acute Rehab PT Goals Patient Stated Goal: To walk better without pain PT Goal Formulation: With patient Time For Goal Achievement: 10/30/21 Potential to Achieve Goals: Good    Frequency Min 2X/week     Co-evaluation               AM-PAC PT "6 Clicks" Mobility  Outcome Measure Help needed turning from your back to your side while in a flat bed without using bedrails?: A Little Help needed moving from lying on your back to sitting on the side of a flat bed without using bedrails?: A Little Help needed moving to and from a bed to a chair (including a wheelchair)?: A Little Help needed standing up from a chair using your arms (e.g., wheelchair or bedside chair)?: A Little Help needed to walk in hospital room?: A Little Help needed climbing 3-5 steps with  a railing? : Total 6 Click Score: 16    End of Session   Activity Tolerance: Patient tolerated treatment well Patient left: in chair;with call bell/phone within reach;with chair alarm set;with family/visitor present Nurse Communication: Mobility status PT Visit Diagnosis: History of falling (Z91.81);Difficulty in walking, not elsewhere classified (R26.2);Muscle weakness (generalized) (M62.81);Pain Pain - Right/Left: Right Pain - part of body: Leg (lumbago)    Time: 1132-1212 PT Time Calculation (min) (ACUTE ONLY): 40 min   Charges:   PT Evaluation $PT Eval Low Complexity: 1 Low PT Treatments $Gait Training: 8-22 mins $Therapeutic Activity: 8-22 mins        Kreg Shropshire, DPT 10/17/2021, 1:50 PM

## 2021-10-17 NOTE — Hospital Course (Signed)
88 year old female with past medical history of HTN, hypothyroidism, CKD 3B, chronic back pain and recurrent fall, [facility] presents to the hospital with complaints of ongoing pain for last 2 weeks after her fall found to have acute L2 compression fracture. Currently undergoing kyphoplasty on 6/7. Neurosurgery and IR both on board.

## 2021-10-17 NOTE — Progress Notes (Signed)
Orthopedic Tech Progress Note Patient Details:  Amy Hensley Nov 12, 1932 [id number]  Someone called this morning requesting a LSO BRACE, I told her I would make it stat but this brace was supposed to be ordered yesterday, so I called [employer] for a LSO BRACE  Patient ID: Amy Hensley, female   DOB: 1933/03/09, 88 y.o.   MRN: [id number]  [name] 10/17/2021, 8:22 AM

## 2021-10-17 NOTE — Plan of Care (Signed)

## 2021-10-17 NOTE — Plan of Care (Signed)
Problem: Education: Goal: Ability to verbalize activity precautions or restrictions will improve 10/17/2021 0115 by Francis Dowse, RN Outcome: Progressing 10/17/2021 0114 by Francis Dowse, RN Outcome: Progressing Goal: Knowledge of the prescribed therapeutic regimen will improve 10/17/2021 0115 by Francis Dowse, RN Outcome: Progressing 10/17/2021 0114 by Francis Dowse, RN Outcome: Progressing Goal: Understanding of discharge needs will improve 10/17/2021 0115 by Francis Dowse, RN Outcome: Progressing 10/17/2021 0114 by Francis Dowse, RN Outcome: Progressing   Problem: Activity: Goal: Ability to avoid complications of mobility impairment will improve 10/17/2021 0115 by Francis Dowse, RN Outcome: Progressing 10/17/2021 0114 by Francis Dowse, RN Outcome: Progressing Goal: Ability to tolerate increased activity will improve 10/17/2021 0115 by Francis Dowse, RN Outcome: Progressing 10/17/2021 0114 by Francis Dowse, RN Outcome: Progressing Goal: Will remain free from falls 10/17/2021 0115 by Francis Dowse, RN Outcome: Progressing 10/17/2021 0114 by Francis Dowse, RN Outcome: Progressing   Problem: Bowel/Gastric: Goal: Gastrointestinal status for postoperative course will improve 10/17/2021 0115 by Francis Dowse, RN Outcome: Progressing 10/17/2021 0114 by Francis Dowse, RN Outcome: Progressing   Problem: Clinical Measurements: Goal: Ability to maintain clinical measurements within normal limits will improve 10/17/2021 0115 by Francis Dowse, RN Outcome: Progressing 10/17/2021 0114 by Francis Dowse, RN Outcome: Progressing Goal: Postoperative complications will be avoided or minimized 10/17/2021 0115 by Francis Dowse, RN Outcome: Progressing 10/17/2021 0114 by Francis Dowse, RN Outcome: Progressing Goal: Diagnostic test results will  improve 10/17/2021 0115 by Francis Dowse, RN Outcome: Progressing 10/17/2021 0114 by Francis Dowse, RN Outcome: Progressing   Problem: Pain Management: Goal: Pain level will decrease 10/17/2021 0115 by Francis Dowse, RN Outcome: Progressing 10/17/2021 0114 by Francis Dowse, RN Outcome: Progressing   Problem: Skin Integrity: Goal: Will show signs of wound healing 10/17/2021 0115 by Francis Dowse, RN Outcome: Progressing 10/17/2021 0114 by Francis Dowse, RN Outcome: Progressing   Problem: Health Behavior/Discharge Planning: Goal: Identification of resources available to assist in meeting health care needs will improve 10/17/2021 0115 by Francis Dowse, RN Outcome: Progressing 10/17/2021 0114 by Francis Dowse, RN Outcome: Progressing   Problem: Bladder/Genitourinary: Goal: Urinary functional status for postoperative course will improve 10/17/2021 0115 by Francis Dowse, RN Outcome: Progressing 10/17/2021 0114 by Francis Dowse, RN Outcome: Progressing   Problem: Education: Goal: Knowledge of General Education information will improve Description: Including pain rating scale, medication(s)/side effects and non-pharmacologic comfort measures Outcome: Progressing   Problem: Health Behavior/Discharge Planning: Goal: Ability to manage health-related needs will improve Outcome: Progressing   Problem: Clinical Measurements: Goal: Ability to maintain clinical measurements within normal limits will improve Outcome: Progressing Goal: Will remain free from infection Outcome: Progressing Goal: Diagnostic test results will improve Outcome: Progressing Goal: Respiratory complications will improve Outcome: Progressing Goal: Cardiovascular complication will be avoided Outcome: Progressing   Problem: Activity: Goal: Risk for activity intolerance will decrease Outcome: Progressing   Problem: Nutrition: Goal:  Adequate nutrition will be maintained Outcome: Progressing   Problem: Coping: Goal: Level of anxiety will decrease Outcome: Progressing   Problem: Elimination: Goal: Will not experience complications related to bowel motility Outcome: Progressing Goal: Will not experience complications related to urinary retention Outcome: Progressing   Problem: Pain Managment: Goal: General experience of comfort will improve Outcome: Progressing   Problem: Safety: Goal: Ability to remain free from injury will improve Outcome: Progressing   Problem: Skin Integrity: Goal: Risk for impaired skin integrity  will decrease Outcome: Progressing

## 2021-10-17 NOTE — Progress Notes (Signed)
PT Cancellation Note  Patient Details Name: NALEE LIGHTLE MRN: [id number] DOB: 12/29/1932   Cancelled Treatment:    Reason Eval/Treat Not Completed: Other (comment) (patient is awaiting LSO for L2 fracture.) Still awaiting ordered back brace, apparently it has been ordered but not yet here.  Hope to be able to initiate PT later today once it's arrived.    Kreg Shropshire, DPT 10/17/2021, 8:41 AM

## 2021-10-18 ENCOUNTER — Inpatient Hospital Stay: Payer: PPO | Admitting: Radiology

## 2021-10-18 DIAGNOSIS — S32000A Wedge compression fracture of unspecified lumbar vertebra, initial encounter for closed fracture: Secondary | ICD-10-CM | POA: Diagnosis not present

## 2021-10-18 HISTORY — PX: IR KYPHO LUMBAR INC FX REDUCE BONE BX UNI/BIL CANNULATION INC/IMAGING: IMG5519

## 2021-10-18 LAB — CBC
HCT: 34.5 % — ABNORMAL LOW (ref 36.0–46.0)
Hemoglobin: 11.2 g/dL — ABNORMAL LOW (ref 12.0–15.0)
MCH: 31 pg (ref 26.0–34.0)
MCHC: 32.5 g/dL (ref 30.0–36.0)
MCV: 95.6 fL (ref 80.0–100.0)
Platelets: 260 10*3/uL (ref 150–400)
RBC: 3.61 MIL/uL — ABNORMAL LOW (ref 3.87–5.11)
RDW: 13.8 % (ref 11.5–15.5)
WBC: 10.6 10*3/uL — ABNORMAL HIGH (ref 4.0–10.5)
nRBC: 0.3 % — ABNORMAL HIGH (ref 0.0–0.2)

## 2021-10-18 LAB — BASIC METABOLIC PANEL
Anion gap: 7 (ref 5–15)
BUN: 33 mg/dL — ABNORMAL HIGH (ref 8–23)
CO2: 27 mmol/L (ref 22–32)
Calcium: 9 mg/dL (ref 8.9–10.3)
Chloride: 104 mmol/L (ref 98–111)
Creatinine, Ser: 1.37 mg/dL — ABNORMAL HIGH (ref 0.44–1.00)
GFR, Estimated: 37 mL/min — ABNORMAL LOW (ref >60)
Glucose, Bld: 87 mg/dL (ref 70–99)
Potassium: 3.9 mmol/L (ref 3.5–5.1)
Sodium: 138 mmol/L (ref 135–145)

## 2021-10-18 LAB — PROTIME-INR
INR: 1 (ref 0.8–1.2)
Prothrombin Time: 12.9 seconds (ref 11.4–15.2)

## 2021-10-18 IMAGING — XA IR KYPHO VERTEBRAL LUMBAR AUGMENTATION
1 series · 11 of 19 positions shown · non-contrast
Comparison: CT lumbar spine [DATE]
COMPARISON: CT lumbar spine [DATE]

Addendum:
INDICATION: Traumatic L2 compression fracture

EXAM:
L2 vertebral body augmentation and balloon kyphoplasty using
fluoroscopic guidance

[Series 1: interv standard · 11 of 19 slices shown]
[im 1/19]
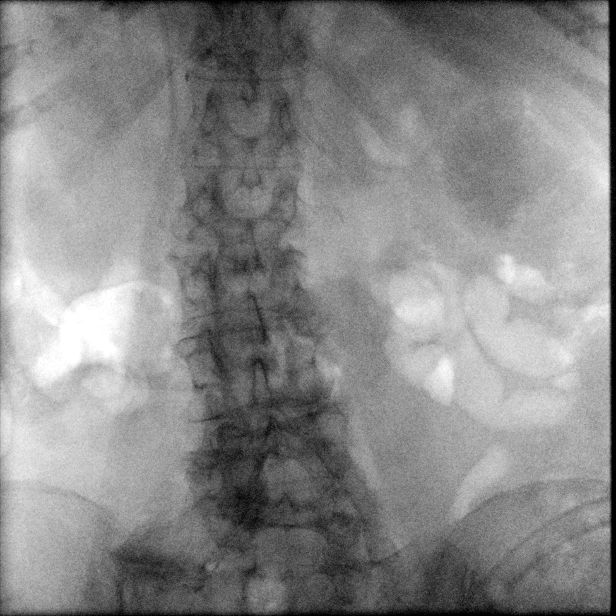
[im 3/19]
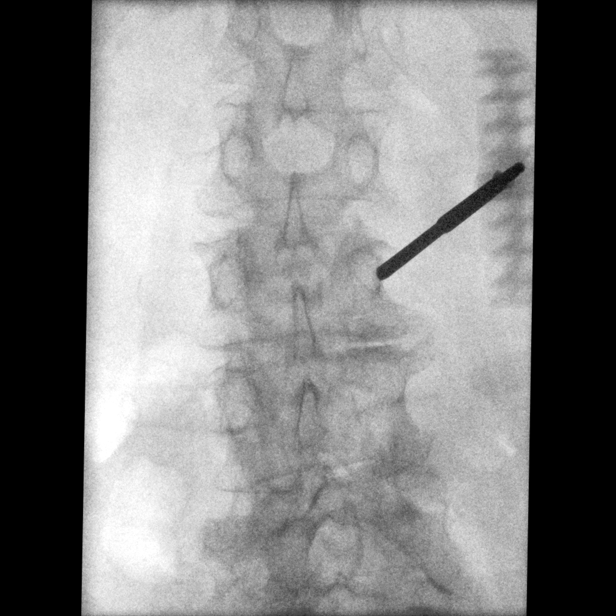
[im 5/19]
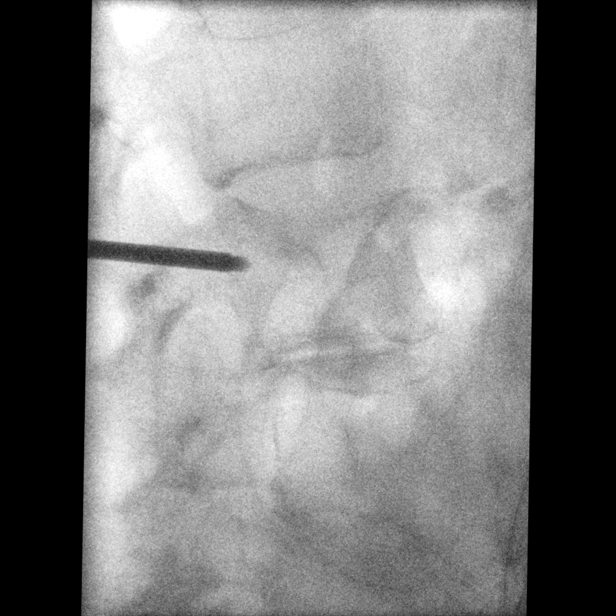
[im 7/19]
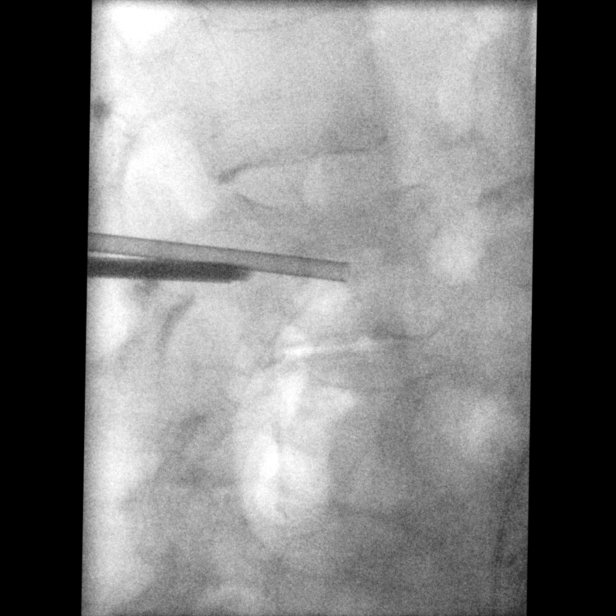
[im 8/19]
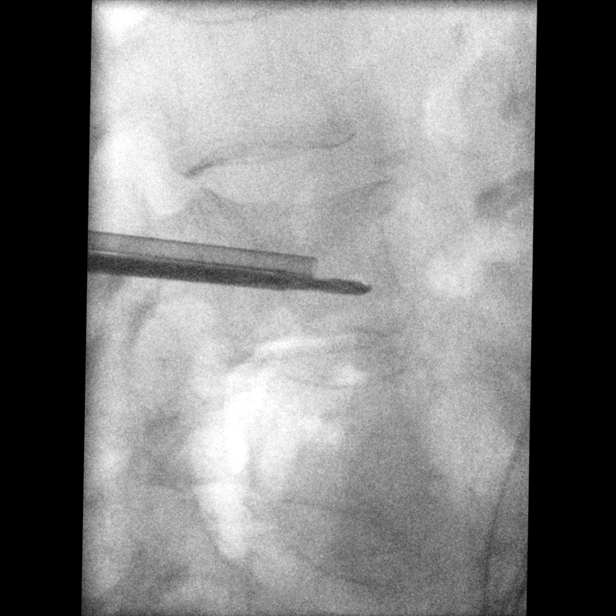
[im 10/19]
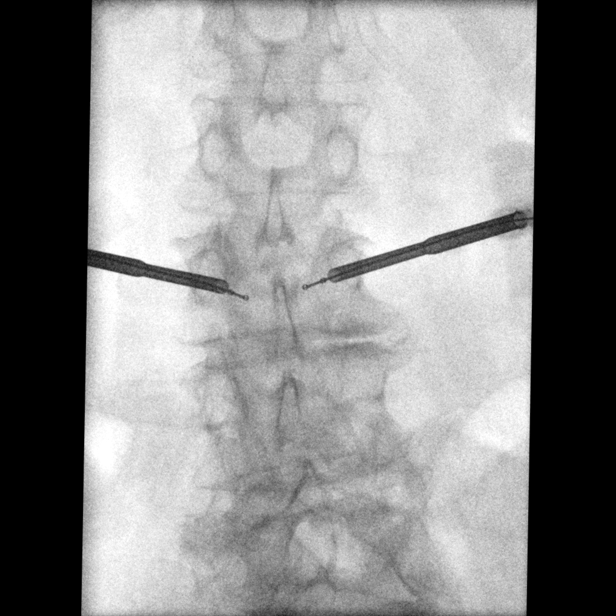
[im 12/19]
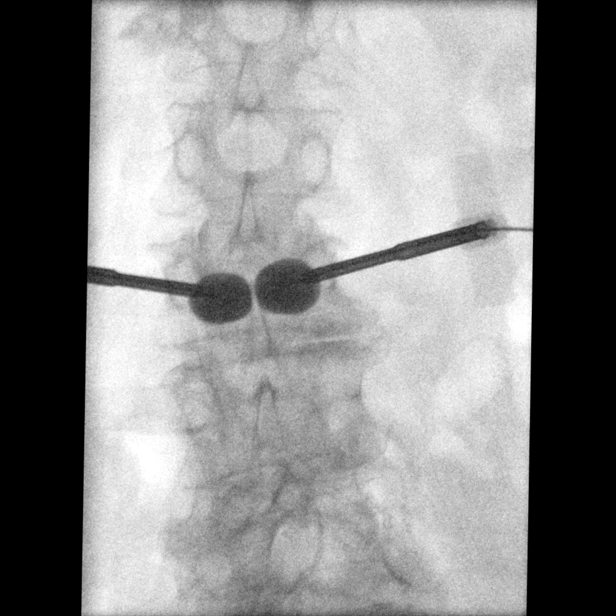
[im 13/19]
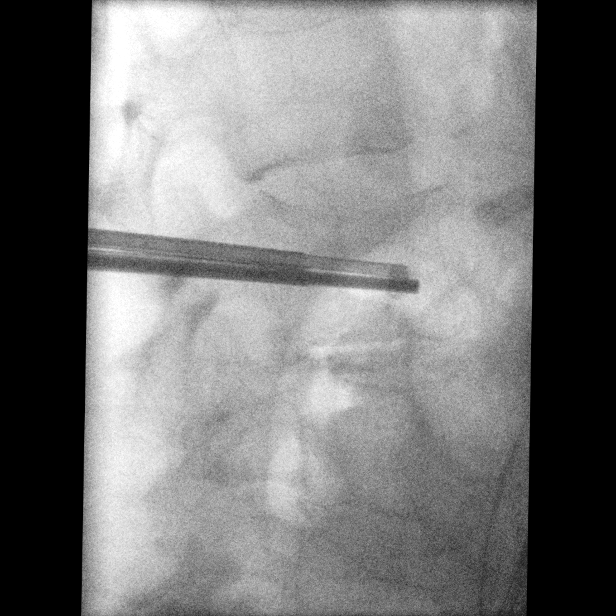
[im 15/19]
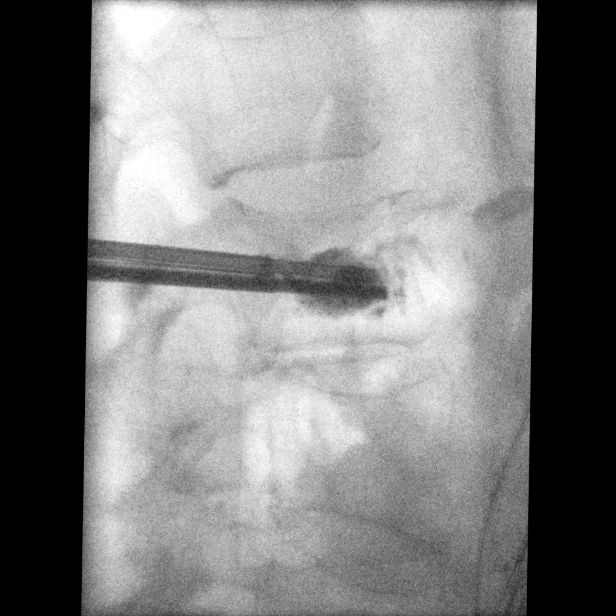
[im 17/19]
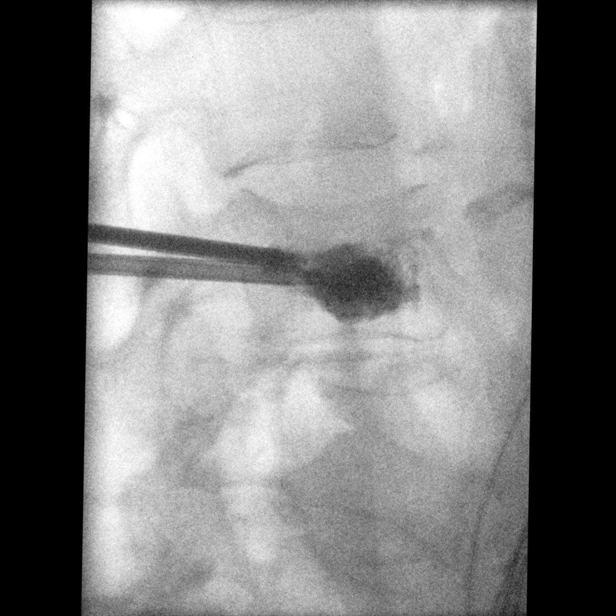
[im 19/19]
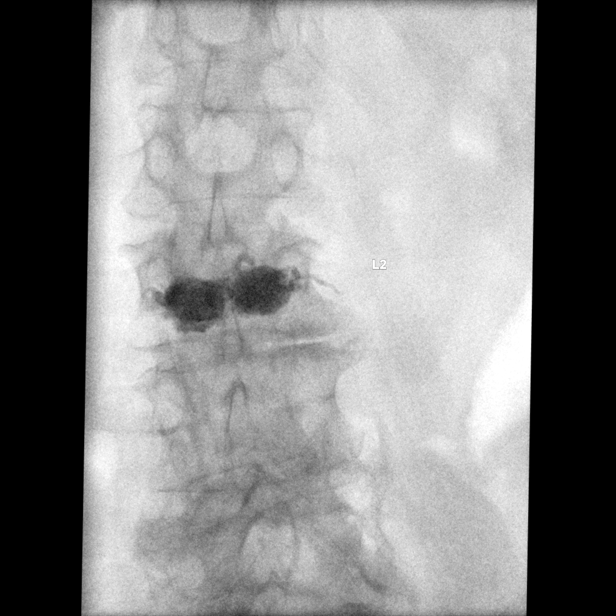

[11 of 19 positions shown; findings below may reference images not displayed]

MEDICATIONS:
Per EMR

ANESTHESIA/SEDATION:
Moderate (conscious) sedation was employed during this procedure. A
total of Versed 1.5 mg and Fentanyl 75 mcg was administered
intravenously by the radiology nurse.

Total intra-service moderate Sedation Time: 41 minutes. The
patient's level of consciousness and vital signs were monitored
continuously by radiology nursing throughout the procedure under my
direct supervision.

FLUOROSCOPY:
Radiation Exposure Index (as provided by the fluoroscopic device):
6.5 minutes (88 mGy)

COMPLICATIONS:
None immediate.

PROCEDURE:
Informed written consent was obtained from the patient after a
thorough discussion of the procedural risks, benefits and
alternatives. All questions were addressed. Maximal Sterile Barrier
Technique was utilized including caps, mask, sterile gowns, sterile
gloves, sterile drape, hand hygiene and skin antiseptic. A timeout
was performed prior to the initiation of the procedure.

The patient was positioned prone on the exam table. A bipedicular
access was planned. Skin entry site(s) were marked overlying the L2
vertebral body using fluoroscopy. The overlying skin was then
prepped and draped in the standard sterile fashion. Local analgesia
was obtained with 1% lidocaine. Attention was first turned to the
right side. Under fluoroscopic guidance, a 10 gauge introducer
needle was advanced towards the lateral margin of the pedicle. Using
multiple projections, the introducer needle was advanced towards the
posterior margin of the vertebral body via a transpedicular
approach. The inner needle was then removed, and the drill was
advanced towards the anterior margin of the vertebral body.

Attention was then turned to the contralateral side. Under
fluoroscopic guidance, a 10 gauge introducer needle was advanced
towards the lateral margin of the pedicle. Using multiple
projections, the introducer needle was advanced towards the
posterior margin of the vertebral body via a transpedicular
approach. The inner needle was then removed, and the drill was
advanced towards the anterior margin of the vertebral body.

The Kyphon 15 mm inflatable bone tamps were then advanced through
the bilateral transpedicular access needles and positioned within
the mid vertebral body. Kyphoplasty was then performed, ensuring
that the balloon contours stayed within the vertebral body margins.
The balloons were then deflated and removed, followed by advancement
of the bone filler devices bilaterally and the instillation of
acrylic bone cement with excellent filling in the AP and lateral
projections. No extravasation was noted in the disk spaces or
posteriorly into the spinal canal. No epidural venous contamination
was seen.

At the end of the procedure, the introducer cannulas and bone filler
devices were then removed without difficulty. Clean dressings were
placed after hemostasis. The patient tolerated all aspects of the
procedure well, and was transferred to recovery in stable condition.
IMPRESSION: 1. Successful L2 vertebral body augmentation and balloon
kyphoplasty.

If the patient has known osteoporosis, recommend treatment as
clinically indicated. If the patient's bone density status is
unknown, DEXA scan is recommended.
FINDINGS: No extravasation was noted in the disk spaces or posteriorly into
the spinal canal. No epidural venous contamination was seen. A tiny
amount of cement was seen anterior and lateral to the anterior
cortex of the vertebral body, which remained stable.

*** End of Addendum ***
MEDICATIONS:
Per EMR

ANESTHESIA/SEDATION:
Moderate (conscious) sedation was employed during this procedure. A
total of Versed 1.5 mg and Fentanyl 75 mcg was administered
intravenously by the radiology nurse.

Total intra-service moderate Sedation Time: 41 minutes. The
patient's level of consciousness and vital signs were monitored
continuously by radiology nursing throughout the procedure under my
direct supervision.

FLUOROSCOPY:
Radiation Exposure Index (as provided by the fluoroscopic device):
6.5 minutes (88 mGy)

COMPLICATIONS:
None immediate.

PROCEDURE:
Informed written consent was obtained from the patient after a
thorough discussion of the procedural risks, benefits and
alternatives. All questions were addressed. Maximal Sterile Barrier
Technique was utilized including caps, mask, sterile gowns, sterile
gloves, sterile drape, hand hygiene and skin antiseptic. A timeout
was performed prior to the initiation of the procedure.

The patient was positioned prone on the exam table. A bipedicular
access was planned. Skin entry site(s) were marked overlying the L2
vertebral body using fluoroscopy. The overlying skin was then
prepped and draped in the standard sterile fashion. Local analgesia
was obtained with 1% lidocaine. Attention was first turned to the
right side. Under fluoroscopic guidance, a 10 gauge introducer
needle was advanced towards the lateral margin of the pedicle. Using
multiple projections, the introducer needle was advanced towards the
posterior margin of the vertebral body via a transpedicular
approach. The inner needle was then removed, and the drill was
advanced towards the anterior margin of the vertebral body.

Attention was then turned to the contralateral side. Under
fluoroscopic guidance, a 10 gauge introducer needle was advanced
towards the lateral margin of the pedicle. Using multiple
projections, the introducer needle was advanced towards the
posterior margin of the vertebral body via a transpedicular
approach. The inner needle was then removed, and the drill was
advanced towards the anterior margin of the vertebral body.

The Kyphon 15 mm inflatable bone tamps were then advanced through
the bilateral transpedicular access needles and positioned within
the mid vertebral body. Kyphoplasty was then performed, ensuring
that the balloon contours stayed within the vertebral body margins.
The balloons were then deflated and removed, followed by advancement
of the bone filler devices bilaterally and the instillation of
acrylic bone cement with excellent filling in the AP and lateral
projections. No extravasation was noted in the disk spaces or
posteriorly into the spinal canal. No epidural venous contamination
was seen.

At the end of the procedure, the introducer cannulas and bone filler
devices were then removed without difficulty. Clean dressings were
placed after hemostasis. The patient tolerated all aspects of the
procedure well, and was transferred to recovery in stable condition.
IMPRESSION: 1. Successful L2 vertebral body augmentation and balloon
kyphoplasty.

If the patient has known osteoporosis, recommend treatment as
clinically indicated. If the patient's bone density status is
unknown, DEXA scan is recommended.

## 2021-10-18 MED ORDER — CEFAZOLIN SODIUM-DEXTROSE 2-4 GM/100ML-% IV SOLN
INTRAVENOUS | Status: AC
  Filled 2021-10-18: qty 100

## 2021-10-18 MED ORDER — CEFAZOLIN SODIUM-DEXTROSE 2-4 GM/100ML-% IV SOLN
INTRAVENOUS | Status: AC | PRN
  Administered 2021-10-18: 2 g via INTRAVENOUS

## 2021-10-18 MED ORDER — LIDOCAINE HCL (PF) 1 % IJ SOLN
INTRAMUSCULAR | Status: AC
  Administered 2021-10-18: 28 mL
  Filled 2021-10-18: qty 30

## 2021-10-18 MED ORDER — FENTANYL CITRATE (PF) 100 MCG/2ML IJ SOLN
INTRAMUSCULAR | Status: AC
  Filled 2021-10-18: qty 2

## 2021-10-18 MED ORDER — MIDAZOLAM HCL 2 MG/2ML IJ SOLN
INTRAMUSCULAR | Status: AC
  Filled 2021-10-18: qty 2

## 2021-10-18 MED ORDER — FENTANYL CITRATE (PF) 100 MCG/2ML IJ SOLN
INTRAMUSCULAR | Status: AC | PRN
  Administered 2021-10-18 (×3): 25 ug via INTRAVENOUS

## 2021-10-18 MED ORDER — MIDAZOLAM HCL 2 MG/2ML IJ SOLN
INTRAMUSCULAR | Status: AC | PRN
  Administered 2021-10-18 (×3): .5 mg via INTRAVENOUS

## 2021-10-18 MED ORDER — SODIUM CHLORIDE 0.9 % IV SOLN
INTRAVENOUS | Status: AC | PRN
  Administered 2021-10-18: 10 mL/h via INTRAVENOUS

## 2021-10-18 NOTE — Progress Notes (Signed)
PT Cancellation Note  Patient Details Name: Amy Hensley MRN: [id number] DOB: Nov 02, 1932   Cancelled Treatment:    Reason Eval/Treat Not Completed: Patient at procedure or test/unavailable Pt out of room for kyphoplasty, will hold PT this date.  Will maintain on PT caseload and continue to treat as appropriate.  Kreg Shropshire, DPT 10/18/2021, 2:59 PM

## 2021-10-18 NOTE — Plan of Care (Signed)

## 2021-10-18 NOTE — Procedures (Signed)
Interventional Radiology Procedure Note  Date of Procedure: 10/18/2021  Procedure: L2 kyphoplasty   Findings:  1. Successful L2 kyphoplasty    Complications: No immediate complications noted.   Estimated Blood Loss: minimal  Follow-up and Recommendations: 1. Bedrest 1 hour  2. Follow up with IR in 2-3 weeks    Albin Felling, MD  Vascular & Interventional Radiology  10/18/2021 2:55 PM

## 2021-10-18 NOTE — Progress Notes (Signed)
PROGRESS NOTE    Amy Hensley  ERX:[id number] DOB: [birth year] DOA: 10/15/2021 PCP: Adin Hector, MD    Brief Narrative:  88 year old female with past medical history of HTN, hypothyroidism, CKD 3B, chronic back pain and recurrent fall, GCA presents to the hospital with complaints of ongoing pain for last 2 weeks after her fall found to have acute L2 compression fracture. Currently undergoing kyphoplasty on 6/7. Neurosurgery and IR both on board.  6/7 plan for kyphoplasty today  Consultants:  IR, neurosurgery  Procedures:   Antimicrobials:      Subjective: No sob or cp. Back pain but tolerable  Objective: Vitals:   10/18/21 1430 10/18/21 1435 10/18/21 1440 10/18/21 1444  BP: 119/76 123/74 116/73 127/82  Pulse: 88 85 84 90  Resp: '11 12 12 12  '$ Temp:      TempSrc:      SpO2: 96% 94% 95% 95%  Weight:      Height:        Intake/Output Summary (Last 24 hours) at 10/18/2021 1445 Last data filed at 10/18/2021 0950 Gross per 24 hour  Intake --  Output 1400 ml  Net -1400 ml   Filed Weights   10/15/21 2203  Weight: 60 kg    Examination: Calm, NAD Cta no w/r Reg s1/s2 no gallop Soft benign +bs No edema Aaoxox3  Mood and affect appropriate in current setting     Data Reviewed: I have personally reviewed following labs and imaging studies  CBC: Recent Labs  Lab 10/15/21 2206 10/16/21 0514 10/18/21 0437  WBC 12.0* 12.0* 10.6*  HGB 11.7* 11.0* 11.2*  HCT 36.3 34.8* 34.5*  MCV 97.1 96.4 95.6  PLT 240 222 950   Basic Metabolic Panel: Recent Labs  Lab 10/15/21 2206 10/16/21 0514 10/18/21 0437  NA 138 139 138  K 3.5 3.5 3.9  CL 102 104 104  CO2 '26 29 27  '$ GLUCOSE 160* 90 87  BUN 38* 33* 33*  CREATININE 1.72* 1.36* 1.37*  CALCIUM 9.0 8.7* 9.0  MG  --  2.0  --    GFR: Estimated Creatinine Clearance: 25 mL/min (A) (by C-G formula based on SCr of 1.37 mg/dL (H)). Liver Function Tests: Recent Labs  Lab 10/16/21 0514  AST 18  ALT 19  ALKPHOS  87  BILITOT 0.7  PROT 5.8*  ALBUMIN 2.6*   No results for input(s): LIPASE, AMYLASE in the last 168 hours. No results for input(s): AMMONIA in the last 168 hours. Coagulation Profile: Recent Labs  Lab 10/18/21 0437  INR 1.0   Cardiac Enzymes: No results for input(s): CKTOTAL, CKMB, CKMBINDEX, TROPONINI in the last 168 hours. BNP (last 3 results) No results for input(s): PROBNP in the last 8760 hours. HbA1C: No results for input(s): HGBA1C in the last 72 hours. CBG: No results for input(s): GLUCAP in the last 168 hours. Lipid Profile: No results for input(s): CHOL, HDL, LDLCALC, TRIG, CHOLHDL, LDLDIRECT in the last 72 hours. Thyroid Function Tests: Recent Labs    10/16/21 0514  TSH 0.453   Anemia Panel: No results for input(s): VITAMINB12, FOLATE, FERRITIN, TIBC, IRON, RETICCTPCT in the last 72 hours. Sepsis Labs: No results for input(s): PROCALCITON, LATICACIDVEN in the last 168 hours.  No results found for this or any previous visit (from the past 240 hour(s)).       Radiology Studies: No results found.      Scheduled Meds:  amLODipine  10 mg Oral Daily   fentaNYL  heparin  5,000 Units Subcutaneous Q8H   levothyroxine  50 mcg Oral Q0600   lidocaine (PF)       magnesium oxide  400 mg Oral Daily   midazolam       pantoprazole  40 mg Oral Daily   predniSONE  20 mg Oral Q breakfast   pregabalin  25 mg Oral BID   Continuous Infusions:  sodium chloride 10 mL/hr (10/18/21 1343)    ceFAZolin (ANCEF) IV     ceFAZolin 2 g (10/18/21 1404)    Assessment & Plan:   Principal Problem:   Lumbar compression fracture, closed, initial encounter [facility]) Active Problems:   History of fall 08/2021 with sacral insufficiency fractures   Acute renal failure superimposed on stage 3b chronic kidney disease (HCC)   Giant cell arteritis (HCC)   Leukocytosis   Hepatitis C antibody positive in blood   Ambulatory dysfunction   Hypertension   Hypothyroidism   Lumbar  compression fracture, closed, initial encounter ([place]) Presents with progressively worsening ambulatory status due to lower back pain. Work-up in the ER shows acute L2 compression fracture. Neurosurgery consulted recommend IR guided kyphoplasty. Continue pain control and PT OT. Continue LSO brace. 6/7 plan for kyphoplasty today    Giant cell arteritis ([place]) Patient chronically on prednisone increasing the risk for osteoporosis. 6/7 currently being tapered.    Acute renal failure superimposed on stage 3b chronic kidney disease (HCC) Baseline serum creatinine around 1.4.  On presentation serum creatinine around 1.7.  6/7 improved with ivf. Currently at baseline     Leukocytosis 2/2 to steroids, improving.  Monitor     Hypothyroidism - Continue Synthroid   Hypertension - Continue Norvasc -Continue to monitor   Ambulatory dysfunction PT OT recommends SNF. Family unable to provide care.   Hepatitis C antibody positive in blood - Patient completed 12-week course of Epclusa May 2023   DVT prophylaxis: heparin Code Status:full Family Communication: family at bedside Disposition Plan:  Status is: Inpatient Remains inpatient appropriate because: having procedure today.         LOS: 2 days   Time spent: 35 min    Nolberto Hanlon, MD [facility] Pager [phone]  If 7PM-7AM, please contact night-coverage 10/18/2021, 2:45 PM

## 2021-10-18 NOTE — NC FL2 (Signed)
[place] LEVEL OF CARE SCREENING TOOL     IDENTIFICATION  Patient Name: Amy Hensley Birthdate: 02-25-1933 Sex: female Admission Date (Current Location): 10/15/2021  [facility] and [place] Number:  Engineering geologist and Address:  [facility], [address] [place], [place] [address]      Provider Number: [id number]  Attending Physician Name and Address:  Nolberto Hanlon, MD  Relative Name and Phone Number:  Barnabas Lister [phone]    Current Level of Care: Hospital Recommended Level of Care:   Prior Approval Number:    Date Approved/Denied:   PASRR Number: [id number]  Discharge Plan: SNF    Current Diagnoses: Patient Active Problem List   Diagnosis Date Noted   Lumbar compression fracture, closed, initial encounter ([place]) 10/16/2021   Intractable back pain 09/25/2021   Lumbar radiculopathy 09/25/2021   Ambulatory dysfunction 09/25/2021   Acute renal failure superimposed on stage 3b chronic kidney disease ([place]) 09/25/2021   History of fall 08/2021 with sacral insufficiency fractures 09/25/2021   Giant cell arteritis ([place]) 09/25/2021   Hypertension    Hypothyroidism    Hepatitis C    Leukocytosis    Hepatitis C antibody positive in blood 06/01/2021    Orientation RESPIRATION BLADDER Height & Weight     Self, Time, Situation, Place  Normal Continent Weight: 60 kg Height:  '5\' 4"'$  (162.6 cm)  BEHAVIORAL SYMPTOMS/MOOD NEUROLOGICAL BOWEL NUTRITION STATUS      Continent Diet (See DC summary)  AMBULATORY STATUS COMMUNICATION OF NEEDS Skin   Extensive Assist Verbally Normal                       Personal Care Assistance Level of Assistance  Bathing, Feeding, Dressing Bathing Assistance: Limited assistance Feeding assistance: Independent Dressing Assistance: Limited assistance     Functional Limitations Info             SPECIAL CARE FACTORS FREQUENCY  PT (By licensed PT), OT (By licensed OT)     PT Frequency: 5 times  per week OT Frequency: 5 times per week            Contractures Contractures Info: Not present    Additional Factors Info  Code Status, Allergies Code Status Info: full code Allergies Info: Alendronate, Buspirone           Current Medications (10/18/2021):  This is the current hospital active medication list Current Facility-Administered Medications  Medication Dose Route Frequency Provider Last Rate Last Admin   acetaminophen (TYLENOL) tablet 650 mg  650 mg Oral Q6H PRN Zierle-Ghosh, Asia B, DO       amLODipine (NORVASC) tablet 10 mg  10 mg Oral Daily Zierle-Ghosh, Asia B, DO   10 mg at 10/17/21 0904   ceFAZolin (ANCEF) IVPB 2g/100 mL premix  2 g Intravenous to Pecola Lawless, Koreen D, PA-C       heparin injection 5,000 Units  5,000 Units Subcutaneous Q8H Morgan, Koreen D, PA-C       levothyroxine (SYNTHROID) tablet 50 mcg  50 mcg Oral Q0600 Zierle-Ghosh, Asia B, DO   50 mcg at 10/18/21 0557   magnesium oxide (MAG-OX) tablet 400 mg  400 mg Oral Daily Zierle-Ghosh, Asia B, DO   400 mg at 10/17/21 0903   morphine (PF) 2 MG/ML injection 2 mg  2 mg Intravenous Q2H PRN Zierle-Ghosh, Asia B, DO       ondansetron (ZOFRAN) tablet 4 mg  4 mg Oral Q6H PRN Zierle-Ghosh, [place]  B, DO       Or   ondansetron (ZOFRAN) injection 4 mg  4 mg Intravenous Q6H PRN Zierle-Ghosh, Asia B, DO       oxyCODONE (Oxy IR/ROXICODONE) immediate release tablet 5 mg  5 mg Oral Q4H PRN Zierle-Ghosh, Asia B, DO   5 mg at 10/18/21 0557   pantoprazole (PROTONIX) EC tablet 40 mg  40 mg Oral Daily Zierle-Ghosh, Asia B, DO   40 mg at 10/17/21 0903   polyethylene glycol (MIRALAX / GLYCOLAX) packet 17 g  17 g Oral Daily PRN Zierle-Ghosh, Asia B, DO   17 g at 10/17/21 0554   predniSONE (DELTASONE) tablet 20 mg  20 mg Oral Q breakfast Zierle-Ghosh, Asia B, DO   20 mg at 10/17/21 0904   pregabalin (LYRICA) capsule 25 mg  25 mg Oral BID Zierle-Ghosh, Asia B, DO   25 mg at 10/17/21 2230     Discharge Medications: Please see  discharge summary for a list of discharge medications.  Relevant Imaging Results:  Relevant Lab Results:   Additional Information SS# [id number]  Conception Oms, RN

## 2021-10-19 DIAGNOSIS — S32000A Wedge compression fracture of unspecified lumbar vertebra, initial encounter for closed fracture: Secondary | ICD-10-CM | POA: Diagnosis not present

## 2021-10-19 MED ORDER — POLYETHYLENE GLYCOL 3350 17 G PO PACK
17.0000 g | PACK | Freq: Two times a day (BID) | ORAL | Status: DC
  Administered 2021-10-19 – 2021-10-20 (×2): 17 g via ORAL
  Filled 2021-10-19 (×2): qty 1

## 2021-10-19 MED ORDER — SENNOSIDES-DOCUSATE SODIUM 8.6-50 MG PO TABS
1.0000 | ORAL_TABLET | Freq: Two times a day (BID) | ORAL | Status: DC
  Administered 2021-10-19 – 2021-10-20 (×2): 1 via ORAL
  Filled 2021-10-19 (×2): qty 1

## 2021-10-19 MED ORDER — FLEET ENEMA 7-19 GM/118ML RE ENEM
1.0000 | ENEMA | Freq: Once | RECTAL | Status: AC
Start: 2021-10-19 — End: 2021-10-19
  Administered 2021-10-19: 1 via RECTAL

## 2021-10-19 NOTE — TOC Progression Note (Signed)
Transition of Care [facility]) - Progression Note    Patient Details  Name: Amy Hensley MRN: [id number] Date of Birth: 07/26/1932  Transition of Care [facility]) CM/SW [place], RN Phone Number: 10/19/2021, 8:55 AM  Clinical Narrative:     The patient was accepted by [facility], She accepted the bed and can pay the copay after 6 days, she will need EMS transport, I called [employer] and started the ins approval process,       Expected Discharge Plan and Services           Expected Discharge Date: 10/20/21                                     Social Determinants of Health (SDOH) Interventions    Readmission Risk Interventions     No data to display

## 2021-10-19 NOTE — Progress Notes (Signed)
Referring Physician(s): Lavina Hamman, MD  Supervising Physician: Michaelle Birks  Patient Status:  [facility] - In-pt  Reason for visit: Symptomatic L2 compression fracture s/p successful L2 Kyphoplasty 6/7 with IR  Subjective: Patient states her pre procedure low back pain has improved since the procedure, she is very happy and has no complaints at this time. She denies any new lower extremity changes in weakness or sensation. She is eager to go to rehab and continue to work on her strength.   Allergies: Alendronate and Buspirone  Medications: Prior to Admission medications   Medication Sig Start Date End Date Taking? Authorizing Provider  amLODipine (NORVASC) 10 MG tablet Take 10 mg by mouth daily.   Yes [provider]  Calcium Carb-Cholecalciferol 600-800 MG-UNIT TABS Take 1 tablet by mouth daily.   Yes [provider]  Cholecalciferol (VITAMIN D3 PO) Take 1 tablet by mouth daily.   Yes [provider]  codeine 15 MG tablet Take 15 mg by mouth every 6 (six) hours as needed.   Yes [provider]  esomeprazole (NEXIUM) 20 MG capsule Take 20 mg by mouth every morning. 05/18/21 05/18/22 Yes [provider]  furosemide (LASIX) 20 MG tablet Take 20 mg by mouth daily.   Yes [provider]  levothyroxine (SYNTHROID) 50 MCG tablet Take 50 mcg by mouth daily before breakfast.  07/09/16  Yes [provider]  magnesium oxide (MAG-OX) 400 MG tablet Take 400 mg by mouth daily.   Yes [provider]  predniSONE (DELTASONE) 50 MG tablet Take 0.5 tablets (25 mg total) by mouth daily with breakfast. 09/28/21  Yes Kc, Ramesh, MD  pregabalin (LYRICA) 25 MG capsule Take 25 mg by mouth 2 (two) times daily.   Yes [provider]  acetaminophen (TYLENOL) 325 MG tablet Take 650 mg by mouth every 6 (six) hours as needed.    [provider]  oxyCODONE (OXY IR/ROXICODONE) 5 MG immediate release tablet Take 1 tablet (5 mg  total) by mouth every 8 (eight) hours as needed for up to 10 doses for moderate pain. Patient not taking: Reported on 10/16/2021 09/28/21   Antonieta Pert, MD   Vital Signs: BP 126/71 (BP Location: Left Arm)   Pulse 82   Temp 98 F (36.7 C)   Resp 18   Ht '5\' 4"'$  (1.626 m)   Wt 132 lb 4.4 oz (60 kg)   SpO2 100%   BMI 22.71 kg/m   Physical Exam General: Alert, NAD, lying in bed and family bedside Back: TSLO brace on   Imaging: IR KYPHO LUMBAR INC FX REDUCE BONE BX UNI/BIL CANNULATION INC/IMAGING  Addendum Date: 10/19/2021   ADDENDUM REPORT: 10/19/2021 10:38 FINDINGS: No extravasation was noted in the disk spaces or posteriorly into the spinal canal. No epidural venous contamination was seen. A tiny amount of cement was seen anterior and lateral to the anterior cortex of the vertebral body, which remained stable. Electronically Signed   By: Albin Felling M.D.   On: 10/19/2021 10:38   Result Date: 10/19/2021 INDICATION: Traumatic L2 compression fracture EXAM: L2 vertebral body augmentation and balloon kyphoplasty using fluoroscopic guidance COMPARISON:  CT lumbar spine October 15, 2021 MEDICATIONS: Per EMR ANESTHESIA/SEDATION: Moderate (conscious) sedation was employed during this procedure. A total of Versed 1.5 mg and Fentanyl 75 mcg was administered intravenously by the radiology nurse. Total intra-service moderate Sedation Time: 41 minutes. The patient's level of consciousness and vital signs were monitored continuously by radiology nursing throughout the  procedure under my direct supervision. FLUOROSCOPY: Radiation Exposure Index (as provided by the fluoroscopic device): 6.5 minutes (88 mGy) COMPLICATIONS: None immediate. PROCEDURE: Informed written consent was obtained from the patient after a thorough discussion of the procedural risks, benefits and alternatives. All questions were addressed. Maximal Sterile Barrier Technique was utilized including caps, mask, sterile gowns, sterile gloves, sterile  drape, hand hygiene and skin antiseptic. A timeout was performed prior to the initiation of the procedure. The patient was positioned prone on the exam table. A bipedicular access was planned. Skin entry site(s) were marked overlying the L2 vertebral body using fluoroscopy. The overlying skin was then prepped and draped in the standard sterile fashion. Local analgesia was obtained with 1% lidocaine. Attention was first turned to the right side. Under fluoroscopic guidance, a 10 gauge introducer needle was advanced towards the lateral margin of the pedicle. Using multiple projections, the introducer needle was advanced towards the posterior margin of the vertebral body via a transpedicular approach. The inner needle was then removed, and the drill was advanced towards the anterior margin of the vertebral body. Attention was then turned to the contralateral side. Under fluoroscopic guidance, a 10 gauge introducer needle was advanced towards the lateral margin of the pedicle. Using multiple projections, the introducer needle was advanced towards the posterior margin of the vertebral body via a transpedicular approach. The inner needle was then removed, and the drill was advanced towards the anterior margin of the vertebral body. The Kyphon 15 mm inflatable bone tamps were then advanced through the bilateral transpedicular access needles and positioned within the mid vertebral body. Kyphoplasty was then performed, ensuring that the balloon contours stayed within the vertebral body margins. The balloons were then deflated and removed, followed by advancement of the bone filler devices bilaterally and the instillation of acrylic bone cement with excellent filling in the AP and lateral projections. No extravasation was noted in the disk spaces or posteriorly into the spinal canal. No epidural venous contamination was seen. At the end of the procedure, the introducer cannulas and bone filler devices were then removed  without difficulty. Clean dressings were placed after hemostasis. The patient tolerated all aspects of the procedure well, and was transferred to recovery in stable condition. IMPRESSION: 1. Successful L2 vertebral body augmentation and balloon kyphoplasty. If the patient has known osteoporosis, recommend treatment as clinically indicated. If the patient's bone density status is unknown, DEXA scan is recommended. Electronically Signed: By: Albin Felling M.D. On: 10/19/2021 10:11   US Venous Img Lower Bilateral (DVT)  Result Date: 10/16/2021 CLINICAL DATA:  9965; edema EXAM: Bilateral LOWER EXTREMITY VENOUS DOPPLER ULTRASOUND TECHNIQUE: Gray-scale sonography with compression, as well as color and duplex ultrasound, were performed to evaluate the deep venous system(s) from the level of the common femoral vein through the popliteal and proximal calf veins. COMPARISON:  None Available. FINDINGS: VENOUS Normal compressibility of the common femoral, superficial femoral, and popliteal veins, as well as the visualized calf veins. Visualized portions of profunda femoral vein and great saphenous vein unremarkable. No filling defects to suggest DVT on grayscale or color Doppler imaging. Doppler waveforms show normal direction of venous flow, normal respiratory plasticity and response to augmentation. Limited views of the contralateral common femoral vein are unremarkable. OTHER None. Limitations: none IMPRESSION: Negative. Electronically Signed   By: Valentino Saxon M.D.   On: 10/16/2021 08:19   CT Lumbar Spine Wo Contrast  Result Date: 10/16/2021 CLINICAL DATA:  Initial evaluation for acute low back pain, trauma.  EXAM: CT LUMBAR SPINE WITHOUT CONTRAST TECHNIQUE: Multidetector CT imaging of the lumbar spine was performed without intravenous contrast administration. Multiplanar CT image reconstructions were also generated. RADIATION DOSE REDUCTION: This exam was performed according to the departmental  dose-optimization program which includes automated exposure control, adjustment of the mA and/or kV according to patient size and/or use of iterative reconstruction technique. COMPARISON:  MRI from 09/06/2021. FINDINGS: Segmentation: Transitional features seen about the lumbosacral junction with partial sacralization of the L5 vertebral body. Alignment: Levoscoliosis.  No interval listhesis. Vertebrae: Acute to subacute compression fracture seen involving the superior endplate of L2. Associated height loss measures up to 40% without significant bony retropulsion. Endplate sclerosis at the superior endplate of L4 without definite fracture, likely chronic. Evolving bilateral sacral insufficiency fractures, right greater than left, seen on prior MRI. No other acute fracture.  No discrete or worrisome osseous lesions. Paraspinal and other soft tissues: Paraspinous soft tissues demonstrate no acute finding. Chronic fatty atrophy noted within the posterior paraspinous musculature. Asymmetric left renal atrophy. Moderate aorto bi-iliac atherosclerotic disease. Disc levels: Underlying moderate degenerative spondylosis at L2-3 and L3-4. Multilevel facet hypertrophy, greater on the left. Overall appearance is grossly similar to previous MRI. IMPRESSION: 1. Acute to subacute compression fracture involving the superior endplate of L2 with up to 40% height loss without significant bony retropulsion. 2. Evolving bilateral sacral insufficiency fractures, right greater than left. 3. Levoscoliosis with underlying multilevel degenerative spondylosis and facet arthrosis, grossly similar to recent MRI from 09/06/2021. Aortic Atherosclerosis (ICD10-I70.0). Electronically Signed   By: Jeannine Boga M.D.   On: 10/16/2021 00:02    Labs:  CBC: Recent Labs    09/27/21 0926 10/15/21 2206 10/16/21 0514 10/18/21 0437  WBC 17.1* 12.0* 12.0* 10.6*  HGB 12.7 11.7* 11.0* 11.2*  HCT 38.3 36.3 34.8* 34.5*  PLT 310 240 222 260     COAGS: Recent Labs    06/08/21 1237 10/18/21 0437  INR 0.9 1.0  APTT 21*  --     BMP: Recent Labs    09/27/21 0926 10/15/21 2206 10/16/21 0514 10/18/21 0437  NA 135 138 139 138  K 3.7 3.5 3.5 3.9  CL 99 102 104 104  CO2 '24 26 29 27  '$ GLUCOSE 134* 160* 90 87  BUN 40* 38* 33* 33*  CALCIUM 9.5 9.0 8.7* 9.0  CREATININE 1.63* 1.72* 1.36* 1.37*  GFRNONAA 30* 28* 38* 37*    LIVER FUNCTION TESTS: Recent Labs    06/01/21 1106 08/10/21 1158 10/16/21 0514  BILITOT 0.6 0.7 0.7  AST '28 22 18  '$ ALT '27 20 19  '$ ALKPHOS 83 81 87  PROT 7.7 7.9 5.8*  ALBUMIN 3.8 3.6 2.6*    Assessment and Plan: Symptomatic L2 compression fracture after a fall s/p successful L2 Kyphoplasty 6/7 with IR, afebrile today with no complaints. Patient states her pre-procedure low back pain is completely resolved and denies any issues. IR will call the patient in 2 weeks for follow-up, TSLO as needed and can remove dressing tomorrow.    Electronically Signed: Hedy Jacob, PA-C 10/19/2021, 3:48 PM   I spent a total of 15 Minutes at the the patient's bedside AND on the patient's hospital floor or unit, greater than 50% of which was counseling/coordinating care for L2 compression fracture.

## 2021-10-19 NOTE — Progress Notes (Signed)
PROGRESS NOTE    Amy Hensley  GGY:[id number] DOB: 01-11-1933 DOA: 10/15/2021 PCP: Adin Hector, MD    Brief Narrative:  88 year old female with past medical history of HTN, hypothyroidism, CKD 3B, chronic back pain and recurrent fall, [facility] presents to the hospital with complaints of ongoing pain for last 2 weeks after her fall found to have acute L2 compression fracture. Currently undergoing kyphoplasty on 6/7. Neurosurgery and IR both on board.  6/7 plan for kyphoplasty today 6/8 status post kyphoplasty.  No pain.  PT recommended SNF   Consultants:  IR, neurosurgery  Procedures:   Antimicrobials:      Subjective: No shortness of breath or chest pain  Objective: Vitals:   10/18/21 1610 10/18/21 2018 10/19/21 0410 10/19/21 0735  BP: 125/67 104/64 130/77 126/71  Pulse: 79 91 82 82  Resp: '16 15 15 18  '$ Temp: 97.7 F (36.5 C) 98.3 F (36.8 C) 98.1 F (36.7 C) 98 F (36.7 C)  TempSrc:      SpO2: 96% 96% 93% 100%  Weight:      Height:        Intake/Output Summary (Last 24 hours) at 10/19/2021 1358 Last data filed at 10/19/2021 1025 Gross per 24 hour  Intake 560 ml  Output --  Net 560 ml   Filed Weights   10/15/21 2203  Weight: 60 kg    Examination: Calm, NAD Cta no w/r Reg s1/s2 no gallop Soft benign +bs No edema Aaoxox3  Mood and affect appropriate in current setting     Data Reviewed: I have personally reviewed following labs and imaging studies  CBC: Recent Labs  Lab 10/15/21 2206 10/16/21 0514 10/18/21 0437  WBC 12.0* 12.0* 10.6*  HGB 11.7* 11.0* 11.2*  HCT 36.3 34.8* 34.5*  MCV 97.1 96.4 95.6  PLT 240 222 035   Basic Metabolic Panel: Recent Labs  Lab 10/15/21 2206 10/16/21 0514 10/18/21 0437  NA 138 139 138  K 3.5 3.5 3.9  CL 102 104 104  CO2 '26 29 27  '$ GLUCOSE 160* 90 87  BUN 38* 33* 33*  CREATININE 1.72* 1.36* 1.37*  CALCIUM 9.0 8.7* 9.0  MG  --  2.0  --    GFR: Estimated Creatinine Clearance: 25 mL/min (A) (by C-G  formula based on SCr of 1.37 mg/dL (H)). Liver Function Tests: Recent Labs  Lab 10/16/21 0514  AST 18  ALT 19  ALKPHOS 87  BILITOT 0.7  PROT 5.8*  ALBUMIN 2.6*   No results for input(s): "LIPASE", "AMYLASE" in the last 168 hours. No results for input(s): "AMMONIA" in the last 168 hours. Coagulation Profile: Recent Labs  Lab 10/18/21 0437  INR 1.0   Cardiac Enzymes: No results for input(s): "CKTOTAL", "CKMB", "CKMBINDEX", "TROPONINI" in the last 168 hours. BNP (last 3 results) No results for input(s): "PROBNP" in the last 8760 hours. HbA1C: No results for input(s): "HGBA1C" in the last 72 hours. CBG: No results for input(s): "GLUCAP" in the last 168 hours. Lipid Profile: No results for input(s): "CHOL", "HDL", "LDLCALC", "TRIG", "CHOLHDL", "LDLDIRECT" in the last 72 hours. Thyroid Function Tests: No results for input(s): "TSH", "T4TOTAL", "FREET4", "T3FREE", "THYROIDAB" in the last 72 hours.  Anemia Panel: No results for input(s): "VITAMINB12", "FOLATE", "FERRITIN", "TIBC", "IRON", "RETICCTPCT" in the last 72 hours. Sepsis Labs: No results for input(s): "PROCALCITON", "LATICACIDVEN" in the last 168 hours.  No results found for this or any previous visit (from the past 240 hour(s)).       Radiology  Studies: IR KYPHO LUMBAR INC FX REDUCE BONE BX UNI/BIL CANNULATION INC/IMAGING  Addendum Date: 10/19/2021   ADDENDUM REPORT: 10/19/2021 10:38 FINDINGS: No extravasation was noted in the disk spaces or posteriorly into the spinal canal. No epidural venous contamination was seen. A tiny amount of cement was seen anterior and lateral to the anterior cortex of the vertebral body, which remained stable. Electronically Signed   By: Albin Felling M.D.   On: 10/19/2021 10:38   Result Date: 10/19/2021 INDICATION: Traumatic L2 compression fracture EXAM: L2 vertebral body augmentation and balloon kyphoplasty using fluoroscopic guidance COMPARISON:  CT lumbar spine October 15, 2021  MEDICATIONS: Per EMR ANESTHESIA/SEDATION: Moderate (conscious) sedation was employed during this procedure. A total of Versed 1.5 mg and Fentanyl 75 mcg was administered intravenously by the radiology nurse. Total intra-service moderate Sedation Time: 41 minutes. The patient's level of consciousness and vital signs were monitored continuously by radiology nursing throughout the procedure under my direct supervision. FLUOROSCOPY: Radiation Exposure Index (as provided by the fluoroscopic device): 6.5 minutes (88 mGy) COMPLICATIONS: None immediate. PROCEDURE: Informed written consent was obtained from the patient after a thorough discussion of the procedural risks, benefits and alternatives. All questions were addressed. Maximal Sterile Barrier Technique was utilized including caps, mask, sterile gowns, sterile gloves, sterile drape, hand hygiene and skin antiseptic. A timeout was performed prior to the initiation of the procedure. The patient was positioned prone on the exam table. A bipedicular access was planned. Skin entry site(s) were marked overlying the L2 vertebral body using fluoroscopy. The overlying skin was then prepped and draped in the standard sterile fashion. Local analgesia was obtained with 1% lidocaine. Attention was first turned to the right side. Under fluoroscopic guidance, a 10 gauge introducer needle was advanced towards the lateral margin of the pedicle. Using multiple projections, the introducer needle was advanced towards the posterior margin of the vertebral body via a transpedicular approach. The inner needle was then removed, and the drill was advanced towards the anterior margin of the vertebral body. Attention was then turned to the contralateral side. Under fluoroscopic guidance, a 10 gauge introducer needle was advanced towards the lateral margin of the pedicle. Using multiple projections, the introducer needle was advanced towards the posterior margin of the vertebral body via a  transpedicular approach. The inner needle was then removed, and the drill was advanced towards the anterior margin of the vertebral body. The Kyphon 15 mm inflatable bone tamps were then advanced through the bilateral transpedicular access needles and positioned within the mid vertebral body. Kyphoplasty was then performed, ensuring that the balloon contours stayed within the vertebral body margins. The balloons were then deflated and removed, followed by advancement of the bone filler devices bilaterally and the instillation of acrylic bone cement with excellent filling in the AP and lateral projections. No extravasation was noted in the disk spaces or posteriorly into the spinal canal. No epidural venous contamination was seen. At the end of the procedure, the introducer cannulas and bone filler devices were then removed without difficulty. Clean dressings were placed after hemostasis. The patient tolerated all aspects of the procedure well, and was transferred to recovery in stable condition. IMPRESSION: 1. Successful L2 vertebral body augmentation and balloon kyphoplasty. If the patient has known osteoporosis, recommend treatment as clinically indicated. If the patient's bone density status is unknown, DEXA scan is recommended. Electronically Signed: By: Albin Felling M.D. On: 10/19/2021 10:11        Scheduled Meds:  amLODipine  10 mg  Oral Daily   heparin  5,000 Units Subcutaneous Q8H   levothyroxine  50 mcg Oral Q0600   magnesium oxide  400 mg Oral Daily   pantoprazole  40 mg Oral Daily   predniSONE  20 mg Oral Q breakfast   pregabalin  25 mg Oral BID   Continuous Infusions:   ceFAZolin (ANCEF) IV      Assessment & Plan:   Principal Problem:   Lumbar compression fracture, closed, initial encounter [facility]) Active Problems:   History of fall 08/2021 with sacral insufficiency fractures   Acute renal failure superimposed on stage 3b chronic kidney disease (HCC)   Giant cell arteritis (HCC)    Leukocytosis   Hepatitis C antibody positive in blood   Ambulatory dysfunction   Hypertension   Hypothyroidism   Lumbar compression fracture, closed, initial encounter ([place]) Presents with progressively worsening ambulatory status due to lower back pain. Work-up in the ER shows acute L2 compression fracture. Neurosurgery consulted recommend IR guided kyphoplasty. Continue pain control and PT OT. Continue LSO brace. 6/8 status post kyphoplasty on 6/7 by     Giant cell arteritis ([place]) Patient chronically on prednisone increasing the risk for osteoporosis. 6/8 currently being tapered      Acute renal failure superimposed on stage 3b chronic kidney disease ([place]) Baseline serum creatinine around 1.4.  On presentation serum creatinine around 1.7.  6/8 improved with IV fluids currently at baseline        Leukocytosis Likely due to steroids and its improving     Hypothyroidism - Continue Synthroid   Hypertension - Continue Norvasc -Continue to monitor   Ambulatory dysfunction PT OT recommends SNF. Family unable to provide care.   Hepatitis C antibody positive in blood - Patient completed 12-week course of Epclusa May 2023   DVT prophylaxis: heparin Code Status:full Family Communication: family at bedside Disposition Plan:  Status is: Inpatient Remains inpatient appropriate because: SNF pending        LOS: 3 days   Time spent: 66 min    Nolberto Hanlon, MD [facility] Pager [phone]  If 7PM-7AM, please contact night-coverage 10/19/2021, 1:58 PM

## 2021-10-19 NOTE — Care Management Important Message (Signed)
Important Message  Patient Details  Name: Amy Hensley MRN: [id number] Date of Birth: 1932-10-10   Medicare Important Message Given:  Yes     [name] 10/19/2021, 2:15 PM

## 2021-10-19 NOTE — Plan of Care (Signed)

## 2021-10-19 NOTE — Progress Notes (Signed)
Physical Therapy Treatment/Re-Evaluation Patient Details Name: Amy Hensley MRN: [id number] DOB: 10-Apr-1933 Today's Date: 10/19/2021   History of Present Illness 88 y.o. female with medical history significant for HTN, CKD 3, hep C, giant cell arteritis, lumbar radiculopathy, history of recent hospitalization after fall with sacral insufficiency fractures.  She went to rehab and after 2 days at home returns to [facility] with yet another fall and increased back pain, new onset L2 compression fx. Now again with worsening of her chronic low back pain with radiation down into LEs.  MD assessment includes: Intractable back pain, abmulatory dysfunction, leukocytosis, and lumbar radiculopathy. L2 kyphoplasty on 10/18/21.    PT Comments    Pt received sitting in recliner, DIL in room and agreeable to therapy. Reviewed pt fall history and concerns regarding upcoming discharge including number of days that will be covered by insurance at [facility]. Notified care team for further follow up. Pt performed STS and ambulation with CGA; occasional posterior lean upon initial stand during the transition requiring tactile cueing for forward weight shift. Pt was unable to remove hands from RW to manage doffing/donning brief during toileting; PT assisted. PT also assisted with donning pjs while sitting EOB. Provided exercise packets at end of session for continued strengthening. Encouraged pt family to be proactive with mobilizing at SNF and performing strengthening packet; also encouraged family to look into day time care once pt does return home for improved safety. Recommending SNF due to multiple recent falls and lack of 24-hour care at home. Pt would benefit from continued skilled PT to address above deficits for improved safety and QOL.     Recommendations for follow up therapy are one component of a multi-disciplinary discharge planning process, led by the attending physician.  Recommendations may be updated based on patient  status, additional functional criteria and insurance authorization.  Follow Up Recommendations  Skilled nursing-short term rehab (<3 hours/day)     Assistance Recommended at Discharge Frequent or constant Supervision/Assistance  Patient can return home with the following A lot of help with walking and/or transfers;A little help with bathing/dressing/bathroom;Help with stairs or ramp for entrance;Assist for transportation   Equipment Recommendations  Rolling walker (2 wheels);BSC/3in1    Recommendations for Other Services       Precautions / Restrictions Precautions Precautions: Fall Required Braces or Orthoses: Spinal Brace Spinal Brace: Lumbar corset Restrictions Weight Bearing Restrictions: No     Mobility  Bed Mobility Overal bed mobility: Needs Assistance Bed Mobility: Rolling, Sidelying to Sit Rolling: Min guard Sidelying to sit: Mod assist       General bed mobility comments: In chair upon arrival. MOD A to manage BLE back to bed while maintaining log roll technique. Pt forgetful on how to perform.    Transfers Overall transfer level: Needs assistance Equipment used: Rolling walker (2 wheels) Transfers: Sit to/from Stand Sit to Stand: Min guard           General transfer comment: Heavy reliance on BUE assist to push up to standing. Does exhibit a posterior lean with CGA to guide weightshift over feet. PT ensuring pt and RW are steady. 3 reps performed.    Ambulation/Gait Ambulation/Gait assistance: Min guard Gait Distance (Feet): 15 Feet Assistive device: Rolling walker (2 wheels)   Gait velocity: decreased     General Gait Details: x2 reps for ambulatory toilet transfer to bathroom. Decreased foot clearance bilaterally, wide BOS, cautious gait. LSO donned.   Stairs  Wheelchair Mobility    Modified Rankin (Stroke Patients Only)       Balance Overall balance assessment: Needs assistance   Sitting balance-Leahy Scale: Good      Standing balance support: Bilateral upper extremity supported, During functional activity, Reliant on assistive device for balance Standing balance-Leahy Scale: Fair Standing balance comment: Reliant on RW; unable to remove hands for doffing/donning brief. No LOB.                            Cognition Arousal/Alertness: Awake/alert Behavior During Therapy: WFL for tasks assessed/performed Overall Cognitive Status: Within Functional Limits for tasks assessed                                          Exercises Other Exercises Other Exercises: Provided exercise packets for supine and seated LE strengthening.    General Comments        Pertinent Vitals/Pain Pain Assessment Pain Assessment: No/denies pain    Home Living Family/patient expects to be discharged to:: Skilled nursing facility Living Arrangements: Spouse/significant other Available Help at Discharge: Family;Available PRN/intermittently Type of Home: House Home Access: Stairs to enter Entrance Stairs-Rails: Left Entrance Stairs-Number of Steps: 3   Home Layout: One level Home Equipment: Air cabin crew (4 wheels);Cane - single point;Grab bars - tub/shower;Rolling Walker (2 wheels) Additional Comments: Was in SNF following a fall, d/c home (friday) and endured 2 falls the next day. Returned to ER on Sunday. Pt lives with spouse who has medical issues, ambulates with a rollator, and stated (repeatedly and in no-uncertain-terms) he is not able to provide significant physical assist to patient. Husband is in surgery at time of PT eval.    Prior Function            PT Goals (current goals can now be found in the care plan section) Acute Rehab PT Goals Patient Stated Goal: to avoid future falls PT Goal Formulation: With patient Time For Goal Achievement: 11/02/21 Potential to Achieve Goals: Good    Frequency    Min 2X/week      PT Plan      Co-evaluation               AM-PAC PT "6 Clicks" Mobility   Outcome Measure  Help needed turning from your back to your side while in a flat bed without using bedrails?: A Little Help needed moving from lying on your back to sitting on the side of a flat bed without using bedrails?: A Little Help needed moving to and from a bed to a chair (including a wheelchair)?: A Little Help needed standing up from a chair using your arms (e.g., wheelchair or bedside chair)?: A Little Help needed to walk in hospital room?: A Little Help needed climbing 3-5 steps with a railing? : Total 6 Click Score: 16    End of Session Equipment Utilized During Treatment: Gait belt Activity Tolerance: Patient tolerated treatment well Patient left: with call bell/phone within reach;with family/visitor present;in bed;with bed alarm set Nurse Communication: Mobility status PT Visit Diagnosis: Difficulty in walking, not elsewhere classified (R26.2);Muscle weakness (generalized) (M62.81);Unsteadiness on feet (R26.81);Repeated falls (R29.6);History of falling (Z91.81)     Time: 1012-1050 PT Time Calculation (min) (ACUTE ONLY): 38 min  Charges:  $Therapeutic Activity: 23-37 mins  Patrina Levering PT, DPT

## 2021-10-20 DIAGNOSIS — R0602 Shortness of breath: Secondary | ICD-10-CM | POA: Diagnosis not present

## 2021-10-20 DIAGNOSIS — M316 Other giant cell arteritis: Secondary | ICD-10-CM | POA: Diagnosis not present

## 2021-10-20 DIAGNOSIS — I10 Essential (primary) hypertension: Secondary | ICD-10-CM | POA: Diagnosis not present

## 2021-10-20 DIAGNOSIS — B182 Chronic viral hepatitis C: Secondary | ICD-10-CM | POA: Diagnosis not present

## 2021-10-20 DIAGNOSIS — R791 Abnormal coagulation profile: Secondary | ICD-10-CM | POA: Diagnosis not present

## 2021-10-20 DIAGNOSIS — R9431 Abnormal electrocardiogram [ECG] [EKG]: Secondary | ICD-10-CM | POA: Diagnosis not present

## 2021-10-20 DIAGNOSIS — S32010A Wedge compression fracture of first lumbar vertebra, initial encounter for closed fracture: Secondary | ICD-10-CM | POA: Diagnosis not present

## 2021-10-20 DIAGNOSIS — S3210XS Unspecified fracture of sacrum, sequela: Secondary | ICD-10-CM | POA: Diagnosis not present

## 2021-10-20 DIAGNOSIS — G8929 Other chronic pain: Secondary | ICD-10-CM | POA: Diagnosis not present

## 2021-10-20 DIAGNOSIS — J9600 Acute respiratory failure, unspecified whether with hypoxia or hypercapnia: Secondary | ICD-10-CM | POA: Diagnosis not present

## 2021-10-20 DIAGNOSIS — Z7952 Long term (current) use of systemic steroids: Secondary | ICD-10-CM | POA: Diagnosis not present

## 2021-10-20 DIAGNOSIS — R52 Pain, unspecified: Secondary | ICD-10-CM | POA: Diagnosis not present

## 2021-10-20 DIAGNOSIS — M47894 Other spondylosis, thoracic region: Secondary | ICD-10-CM | POA: Diagnosis not present

## 2021-10-20 DIAGNOSIS — Z79899 Other long term (current) drug therapy: Secondary | ICD-10-CM | POA: Diagnosis not present

## 2021-10-20 DIAGNOSIS — M545 Low back pain, unspecified: Secondary | ICD-10-CM | POA: Diagnosis not present

## 2021-10-20 DIAGNOSIS — J189 Pneumonia, unspecified organism: Secondary | ICD-10-CM | POA: Diagnosis not present

## 2021-10-20 DIAGNOSIS — S22000G Wedge compression fracture of unspecified thoracic vertebra, subsequent encounter for fracture with delayed healing: Secondary | ICD-10-CM | POA: Diagnosis not present

## 2021-10-20 DIAGNOSIS — M4856XA Collapsed vertebra, not elsewhere classified, lumbar region, initial encounter for fracture: Secondary | ICD-10-CM | POA: Diagnosis not present

## 2021-10-20 DIAGNOSIS — I2699 Other pulmonary embolism without acute cor pulmonale: Secondary | ICD-10-CM | POA: Diagnosis not present

## 2021-10-20 DIAGNOSIS — S32000A Wedge compression fracture of unspecified lumbar vertebra, initial encounter for closed fracture: Secondary | ICD-10-CM | POA: Diagnosis not present

## 2021-10-20 DIAGNOSIS — B1920 Unspecified viral hepatitis C without hepatic coma: Secondary | ICD-10-CM | POA: Diagnosis not present

## 2021-10-20 DIAGNOSIS — R251 Tremor, unspecified: Secondary | ICD-10-CM | POA: Diagnosis not present

## 2021-10-20 DIAGNOSIS — Z79890 Hormone replacement therapy (postmenopausal): Secondary | ICD-10-CM | POA: Diagnosis not present

## 2021-10-20 DIAGNOSIS — S32020D Wedge compression fracture of second lumbar vertebra, subsequent encounter for fracture with routine healing: Secondary | ICD-10-CM | POA: Diagnosis not present

## 2021-10-20 DIAGNOSIS — I129 Hypertensive chronic kidney disease with stage 1 through stage 4 chronic kidney disease, or unspecified chronic kidney disease: Secondary | ICD-10-CM | POA: Diagnosis not present

## 2021-10-20 DIAGNOSIS — Z90710 Acquired absence of both cervix and uterus: Secondary | ICD-10-CM | POA: Diagnosis not present

## 2021-10-20 DIAGNOSIS — J9601 Acute respiratory failure with hypoxia: Secondary | ICD-10-CM | POA: Diagnosis not present

## 2021-10-20 DIAGNOSIS — M549 Dorsalgia, unspecified: Secondary | ICD-10-CM | POA: Diagnosis not present

## 2021-10-20 DIAGNOSIS — K5900 Constipation, unspecified: Secondary | ICD-10-CM | POA: Diagnosis not present

## 2021-10-20 DIAGNOSIS — M7122 Synovial cyst of popliteal space [Baker], left knee: Secondary | ICD-10-CM | POA: Diagnosis not present

## 2021-10-20 DIAGNOSIS — E569 Vitamin deficiency, unspecified: Secondary | ICD-10-CM | POA: Diagnosis not present

## 2021-10-20 DIAGNOSIS — M6281 Muscle weakness (generalized): Secondary | ICD-10-CM | POA: Diagnosis not present

## 2021-10-20 DIAGNOSIS — E039 Hypothyroidism, unspecified: Secondary | ICD-10-CM | POA: Diagnosis not present

## 2021-10-20 DIAGNOSIS — Z9181 History of falling: Secondary | ICD-10-CM | POA: Diagnosis not present

## 2021-10-20 DIAGNOSIS — I248 Other forms of acute ischemic heart disease: Secondary | ICD-10-CM | POA: Diagnosis not present

## 2021-10-20 DIAGNOSIS — M6259 Muscle wasting and atrophy, not elsewhere classified, multiple sites: Secondary | ICD-10-CM | POA: Diagnosis not present

## 2021-10-20 DIAGNOSIS — R0902 Hypoxemia: Secondary | ICD-10-CM | POA: Diagnosis not present

## 2021-10-20 DIAGNOSIS — R2681 Unsteadiness on feet: Secondary | ICD-10-CM | POA: Diagnosis not present

## 2021-10-20 DIAGNOSIS — M546 Pain in thoracic spine: Secondary | ICD-10-CM | POA: Diagnosis not present

## 2021-10-20 DIAGNOSIS — N179 Acute kidney failure, unspecified: Secondary | ICD-10-CM | POA: Diagnosis not present

## 2021-10-20 DIAGNOSIS — I4510 Unspecified right bundle-branch block: Secondary | ICD-10-CM | POA: Diagnosis not present

## 2021-10-20 DIAGNOSIS — M5416 Radiculopathy, lumbar region: Secondary | ICD-10-CM | POA: Diagnosis not present

## 2021-10-20 DIAGNOSIS — N1832 Chronic kidney disease, stage 3b: Secondary | ICD-10-CM | POA: Diagnosis not present

## 2021-10-20 DIAGNOSIS — Z87891 Personal history of nicotine dependence: Secondary | ICD-10-CM | POA: Diagnosis not present

## 2021-10-20 MED ORDER — POLYETHYLENE GLYCOL 3350 17 G PO PACK: 17.0000 g | PACK | Freq: Two times a day (BID) | ORAL | 0 refills | Status: DC

## 2021-10-20 MED ORDER — SENNOSIDES-DOCUSATE SODIUM 8.6-50 MG PO TABS: 1.0000 | ORAL_TABLET | Freq: Every evening | ORAL | Status: DC | PRN

## 2021-10-20 NOTE — TOC Transition Note (Addendum)
Transition of Care [facility]) - CM/SW Discharge Note   Patient Details  Name: SUE FERNICOLA MRN: [id number] Date of Birth: 06/26/1932  [facility]) CM/SW Contact:  Magnus Ivan, LCSW Phone Number: 10/20/2021, 10:58 AM   Clinical Narrative:    Discharge to [facility] [place] today. Room 806. Confirmed with Admissions Worker Tammy. Updated MD, RN, and patient. Asked RN to call report. EMS paperwork completed. Will call [facility] when RN is ready. 11:16- Notified by RN daughter is here and wants to transport patient to SNF. No EMS needed.    Final next level of care: Skilled Nursing Facility Barriers to Discharge: Barriers Resolved   Patient Goals and CMS Choice Patient states their goals for this hospitalization and ongoing recovery are:: [facility] CMS Medicare.gov Compare Post Acute Care list provided to:: Patient Choice offered to / list presented to : Patient  Discharge Placement              Patient chooses bed at: [facility] [place] Patient to be transferred to facility by: [facility] Name of family member notified: Patient notified, said she will notify her family including daughter in law at bedside Patient and family notified of of transfer: 10/20/21  Discharge Plan and Services                                     Social Determinants of Health ([place]) Interventions     Readmission Risk Interventions     No data to display

## 2021-10-20 NOTE — Plan of Care (Signed)

## 2021-10-20 NOTE — Progress Notes (Signed)
Report called to [place] at [facility]. Discharge packet given to daughter.

## 2021-10-20 NOTE — Discharge Summary (Signed)
Amy Hensley:[id number] DOB: 01/30/1933 DOA: 10/15/2021  PCP: Adin Hector, MD  Admit date: 10/15/2021 Discharge date: 10/20/2021  Admitted From: home Disposition:  snf  Recommendations for Outpatient Follow-up:  Follow up with PCP in 1 week Please obtain BMP/CBC in one week IR will call the patient in 2 weeks for follow-up, TSLO as needed      Discharge Condition:Stable CODE STATUS:full  Diet recommendation: Heart Healthy  Brief/Interim Summary: Per HPI: Amy Hensley is a 88 y.o. female with medical history significant of giant cell arteritis, chronic back pain, hypertension, hypothyroidism, CKD stage IIIb, and more presents the ED with a chief complaint of back pain.  Patient reports that she has overall low back pain.  It all started about a week after she started taking prednisone for giant cell arteritis.  That was back in December or January.  Patient reports that the pain is associated with the prednisone.  However she also states that when she tries to taper down the prednisone her back pain is worse.  Patient notes that standing and any movement of her lower extremities provokes the pain.CT lumbar found with  acute L2 compression fracture. (See full CT results below). Neurosurgery was consulted, but they recommended IR for kyphoplasty. Recommended LSBO brace. IR was consulted and patient underwent kyphoplasty on 10/18/21. She is stable for discharge.  Lumbar compression fracture, closed, initial encounter ([place]) Presents with progressively worsening ambulatory status due to lower back pain. Work-up in the ER shows acute L2 compression fracture. Neurosurgery consulted recommend IR guided kyphoplasty. s/p successful L2 Kyphoplasty 6/7 with IR Preprocedure back pain resolved. Continue LSO brace as needed.      Giant cell arteritis (HCC) Patient chronically on prednisone increasing the risk for osteoporosis. currently being tapered  F/u with pcp for further  management.       Acute renal failure superimposed on stage 3b chronic kidney disease (HCC) Baseline serum creatinine around 1.4.  On presentation serum creatinine around 1.7.  improved with IV fluids currently at baseline            Leukocytosis Likely due to steroids and its improving       Hypothyroidism - Continue Synthroid   Hypertension - Continue Norvasc    Ambulatory dysfunction PT OT recommends SNF.    Hepatitis C antibody positive in blood - Patient completed 12-week course of Epclusa May 2023    Discharge Diagnoses:  Principal Problem:   Lumbar compression fracture, closed, initial encounter [facility]) Active Problems:   History of fall 08/2021 with sacral insufficiency fractures   Acute renal failure superimposed on stage 3b chronic kidney disease ([place])   Giant cell arteritis (HCC)   Leukocytosis   Hepatitis C antibody positive in blood   Ambulatory dysfunction   Hypertension   Hypothyroidism    Discharge Instructions  Discharge Instructions     Diet - low sodium heart healthy   Complete by: As directed    Diet - low sodium heart healthy   Complete by: As directed    Increase activity slowly   Complete by: As directed    Increase activity slowly   Complete by: As directed       Allergies as of 10/20/2021       Reactions   Alendronate Other (See Comments)   Dyspepsia (indigestion)    Buspirone Other (See Comments)   sedation        Medication List     STOP taking these medications  codeine 15 MG tablet   furosemide 20 MG tablet Commonly known as: LASIX   oxyCODONE 5 MG immediate release tablet Commonly known as: Oxy IR/ROXICODONE       TAKE these medications    acetaminophen 325 MG tablet Commonly known as: TYLENOL Take 650 mg by mouth every 6 (six) hours as needed.   amLODipine 10 MG tablet Commonly known as: NORVASC Take 10 mg by mouth daily.   Calcium Carb-Cholecalciferol 600-800 MG-UNIT Tabs Take 1 tablet by  mouth daily.   esomeprazole 20 MG capsule Commonly known as: NEXIUM Take 20 mg by mouth every morning.   levothyroxine 50 MCG tablet Commonly known as: SYNTHROID Take 50 mcg by mouth daily before breakfast.   magnesium oxide 400 MG tablet Commonly known as: MAG-OX Take 400 mg by mouth daily.   polyethylene glycol 17 g packet Commonly known as: MIRALAX / GLYCOLAX Take 17 g by mouth 2 (two) times daily.   predniSONE 50 MG tablet Commonly known as: DELTASONE Take 0.5 tablets (25 mg total) by mouth daily with breakfast.   pregabalin 25 MG capsule Commonly known as: LYRICA Take 25 mg by mouth 2 (two) times daily.   senna-docusate 8.6-50 MG tablet Commonly known as: Senokot-S Take 1 tablet by mouth at bedtime as needed for mild constipation.   VITAMIN D3 PO Take 1 tablet by mouth daily.        Follow-up Information     El-Abd, Joesph Fillers, MD Follow up.   Specialties: Interventional Radiology, Diagnostic Radiology, Radiology Contact information: [place] [address] [phone]         Tama High III, MD Follow up in 1 week(s).   Specialty: Internal Medicine Contact information: [place] [address] [phone]                Allergies  Allergen Reactions   Alendronate Other (See Comments)    Dyspepsia (indigestion)    Buspirone Other (See Comments)    sedation    Consultations: Neurosurgery , IR   Procedures/Studies: IR KYPHO LUMBAR INC FX REDUCE BONE BX UNI/BIL CANNULATION INC/IMAGING  Addendum Date: 10/19/2021   ADDENDUM REPORT: 10/19/2021 10:38 FINDINGS: No extravasation was noted in the disk spaces or posteriorly into the spinal canal. No epidural venous contamination was seen. A tiny amount of cement was seen anterior and lateral to the anterior cortex of the vertebral body, which remained stable. Electronically Signed   By: Albin Felling M.D.   On: 10/19/2021  10:38   Result Date: 10/19/2021 INDICATION: Traumatic L2 compression fracture EXAM: L2 vertebral body augmentation and balloon kyphoplasty using fluoroscopic guidance COMPARISON:  CT lumbar spine October 15, 2021 MEDICATIONS: Per EMR ANESTHESIA/SEDATION: Moderate (conscious) sedation was employed during this procedure. A total of Versed 1.5 mg and Fentanyl 75 mcg was administered intravenously by the radiology nurse. Total intra-service moderate Sedation Time: 41 minutes. The patient's level of consciousness and vital signs were monitored continuously by radiology nursing throughout the procedure under my direct supervision. FLUOROSCOPY: Radiation Exposure Index (as provided by the fluoroscopic device): 6.5 minutes (88 mGy) COMPLICATIONS: None immediate. PROCEDURE: Informed written consent was obtained from the patient after a thorough discussion of the procedural risks, benefits and alternatives. All questions were addressed. Maximal Sterile Barrier Technique was utilized including caps, mask, sterile gowns, sterile gloves, sterile drape, hand hygiene and skin antiseptic. A timeout was performed prior to the initiation of the procedure. The patient was  positioned prone on the exam table. A bipedicular access was planned. Skin entry site(s) were marked overlying the L2 vertebral body using fluoroscopy. The overlying skin was then prepped and draped in the standard sterile fashion. Local analgesia was obtained with 1% lidocaine. Attention was first turned to the right side. Under fluoroscopic guidance, a 10 gauge introducer needle was advanced towards the lateral margin of the pedicle. Using multiple projections, the introducer needle was advanced towards the posterior margin of the vertebral body via a transpedicular approach. The inner needle was then removed, and the drill was advanced towards the anterior margin of the vertebral body. Attention was then turned to the contralateral side. Under fluoroscopic guidance,  a 10 gauge introducer needle was advanced towards the lateral margin of the pedicle. Using multiple projections, the introducer needle was advanced towards the posterior margin of the vertebral body via a transpedicular approach. The inner needle was then removed, and the drill was advanced towards the anterior margin of the vertebral body. The Kyphon 15 mm inflatable bone tamps were then advanced through the bilateral transpedicular access needles and positioned within the mid vertebral body. Kyphoplasty was then performed, ensuring that the balloon contours stayed within the vertebral body margins. The balloons were then deflated and removed, followed by advancement of the bone filler devices bilaterally and the instillation of acrylic bone cement with excellent filling in the AP and lateral projections. No extravasation was noted in the disk spaces or posteriorly into the spinal canal. No epidural venous contamination was seen. At the end of the procedure, the introducer cannulas and bone filler devices were then removed without difficulty. Clean dressings were placed after hemostasis. The patient tolerated all aspects of the procedure well, and was transferred to recovery in stable condition. IMPRESSION: 1. Successful L2 vertebral body augmentation and balloon kyphoplasty. If the patient has known osteoporosis, recommend treatment as clinically indicated. If the patient's bone density status is unknown, DEXA scan is recommended. Electronically Signed: By: Albin Felling M.D. On: 10/19/2021 10:11   US Venous Img Lower Bilateral (DVT)  Result Date: 10/16/2021 CLINICAL DATA:  9965; edema EXAM: Bilateral LOWER EXTREMITY VENOUS DOPPLER ULTRASOUND TECHNIQUE: Gray-scale sonography with compression, as well as color and duplex ultrasound, were performed to evaluate the deep venous system(s) from the level of the common femoral vein through the popliteal and proximal calf veins. COMPARISON:  None Available. FINDINGS:  VENOUS Normal compressibility of the common femoral, superficial femoral, and popliteal veins, as well as the visualized calf veins. Visualized portions of profunda femoral vein and great saphenous vein unremarkable. No filling defects to suggest DVT on grayscale or color Doppler imaging. Doppler waveforms show normal direction of venous flow, normal respiratory plasticity and response to augmentation. Limited views of the contralateral common femoral vein are unremarkable. OTHER None. Limitations: none IMPRESSION: Negative. Electronically Signed   By: Valentino Saxon M.D.   On: 10/16/2021 08:19   CT Lumbar Spine Wo Contrast  Result Date: 10/16/2021 CLINICAL DATA:  Initial evaluation for acute low back pain, trauma. EXAM: CT LUMBAR SPINE WITHOUT CONTRAST TECHNIQUE: Multidetector CT imaging of the lumbar spine was performed without intravenous contrast administration. Multiplanar CT image reconstructions were also generated. RADIATION DOSE REDUCTION: This exam was performed according to the departmental dose-optimization program which includes automated exposure control, adjustment of the mA and/or kV according to patient size and/or use of iterative reconstruction technique. COMPARISON:  MRI from 09/06/2021. FINDINGS: Segmentation: Transitional features seen about the lumbosacral junction with partial sacralization of the  L5 vertebral body. Alignment: Levoscoliosis.  No interval listhesis. Vertebrae: Acute to subacute compression fracture seen involving the superior endplate of L2. Associated height loss measures up to 40% without significant bony retropulsion. Endplate sclerosis at the superior endplate of L4 without definite fracture, likely chronic. Evolving bilateral sacral insufficiency fractures, right greater than left, seen on prior MRI. No other acute fracture.  No discrete or worrisome osseous lesions. Paraspinal and other soft tissues: Paraspinous soft tissues demonstrate no acute finding. Chronic  fatty atrophy noted within the posterior paraspinous musculature. Asymmetric left renal atrophy. Moderate aorto bi-iliac atherosclerotic disease. Disc levels: Underlying moderate degenerative spondylosis at L2-3 and L3-4. Multilevel facet hypertrophy, greater on the left. Overall appearance is grossly similar to previous MRI. IMPRESSION: 1. Acute to subacute compression fracture involving the superior endplate of L2 with up to 40% height loss without significant bony retropulsion. 2. Evolving bilateral sacral insufficiency fractures, right greater than left. 3. Levoscoliosis with underlying multilevel degenerative spondylosis and facet arthrosis, grossly similar to recent MRI from 09/06/2021. Aortic Atherosclerosis (ICD10-I70.0). Electronically Signed   By: Jeannine Boga M.D.   On: 10/16/2021 00:02   IR INJECT DIAG/THERA/INC NEEDLE/CATH/PLC EPI/LUMB/SAC W/IMG  Result Date: 09/26/2021 CLINICAL DATA:  88 year old female with lumbosacral spondylosis without myelopathy. She has a significant right L3 radiculopathy which has been recalcitrant to conservative management. She presents for right L3 nerve root block and transforaminal epidural steroid injection. EXAM: IR INJECT/THERA/INC NEEDLE/CATH/PLC EPI/LUMB/SAC W/IMG FLUOROSCOPY TIME:  Radiation Exposure Index (as provided by the fluoroscopic device): 19.6 mGy K[place]a PROCEDURE: The procedure, risks, benefits, and alternatives were explained to the patient. Questions regarding the procedure were encouraged and answered. The patient understands and consents to the procedure. RIGHT L3 NERVE ROOT BLOCK AND TRANSFORAMINAL EPIDURAL: A posterior oblique approach was taken to the intervertebral foramen on the right at L3 using a curved 22 gauge spinal needle. Injection of Omnipaque 180 outlined the L3 nerve root and showed good epidural spread. No vascular opacification is seen. Eighty mg of Depo-Medrol mixed with 2 mL 1% lidocaine were instilled. The procedure was  well-tolerated, and the patient was discharged thirty minutes following the injection in good condition. COMPLICATIONS: None IMPRESSION: Technically successful injection consisting of a right L3 nerve root block and transforaminal epidural. Electronically Signed   By: Jacqulynn Cadet M.D.   On: 09/26/2021 16:43      Subjective: No pain at all. No complaints  Discharge Exam: Vitals:   10/20/21 0418 10/20/21 0759  BP: 134/82 128/66  Pulse: 81 89  Resp: 15 16  Temp: 97.6 F (36.4 C) 98 F (36.7 C)  SpO2: 94% 91%   Vitals:   10/19/21 0735 10/19/21 2056 10/20/21 0418 10/20/21 0759  BP: 126/71 126/69 134/82 128/66  Pulse: 82 78 81 89  Resp: '18 15 15 16  '$ Temp: 98 F (36.7 C) 98.2 F (36.8 C) 97.6 F (36.4 C) 98 F (36.7 C)  TempSrc:      SpO2: 100% 97% 94% 91%  Weight:      Height:        General: Pt is alert, awake, not in acute distress Cardiovascular: RRR, S1/S2 +, no rubs, no gallops Respiratory: CTA bilaterally, no wheezing, no rhonchi Abdominal: Soft, NT, ND, bowel sounds + Extremities: no edema, no cyanosis    The results of significant diagnostics from this hospitalization (including imaging, microbiology, ancillary and laboratory) are listed below for reference.     Microbiology: No results found for this or any previous visit (from the past  240 hour(s)).   Labs: BNP (last 3 results) No results for input(s): "BNP" in the last 8760 hours. Basic Metabolic Panel: Recent Labs  Lab 10/15/21 2206 10/16/21 0514 10/18/21 0437  NA 138 139 138  K 3.5 3.5 3.9  CL 102 104 104  CO2 '26 29 27  '$ GLUCOSE 160* 90 87  BUN 38* 33* 33*  CREATININE 1.72* 1.36* 1.37*  CALCIUM 9.0 8.7* 9.0  MG  --  2.0  --    Liver Function Tests: Recent Labs  Lab 10/16/21 0514  AST 18  ALT 19  ALKPHOS 87  BILITOT 0.7  PROT 5.8*  ALBUMIN 2.6*   No results for input(s): "LIPASE", "AMYLASE" in the last 168 hours. No results for input(s): "AMMONIA" in the last 168  hours. CBC: Recent Labs  Lab 10/15/21 2206 10/16/21 0514 10/18/21 0437  WBC 12.0* 12.0* 10.6*  HGB 11.7* 11.0* 11.2*  HCT 36.3 34.8* 34.5*  MCV 97.1 96.4 95.6  PLT 240 222 260   Cardiac Enzymes: No results for input(s): "CKTOTAL", "CKMB", "CKMBINDEX", "TROPONINI" in the last 168 hours. BNP: Invalid input(s): "POCBNP" CBG: No results for input(s): "GLUCAP" in the last 168 hours. D-Dimer No results for input(s): "DDIMER" in the last 72 hours. Hgb A1c No results for input(s): "HGBA1C" in the last 72 hours. Lipid Profile No results for input(s): "CHOL", "HDL", "LDLCALC", "TRIG", "CHOLHDL", "LDLDIRECT" in the last 72 hours. Thyroid function studies No results for input(s): "TSH", "T4TOTAL", "T3FREE", "THYROIDAB" in the last 72 hours.  Invalid input(s): "FREET3" Anemia work up No results for input(s): "VITAMINB12", "FOLATE", "FERRITIN", "TIBC", "IRON", "RETICCTPCT" in the last 72 hours. Urinalysis    Component Value Date/Time   COLORURINE YELLOW (A) 10/16/2021 0151   APPEARANCEUR CLEAR (A) 10/16/2021 0151   LABSPEC 1.012 10/16/2021 0151   PHURINE 7.0 10/16/2021 0151   GLUCOSEU NEGATIVE 10/16/2021 0151   HGBUR NEGATIVE 10/16/2021 0151   BILIRUBINUR NEGATIVE 10/16/2021 0151   KETONESUR NEGATIVE 10/16/2021 0151   PROTEINUR NEGATIVE 10/16/2021 0151   NITRITE NEGATIVE 10/16/2021 0151   LEUKOCYTESUR NEGATIVE 10/16/2021 0151   Sepsis Labs Recent Labs  Lab 10/15/21 2206 10/16/21 0514 10/18/21 0437  WBC 12.0* 12.0* 10.6*   Microbiology No results found for this or any previous visit (from the past 240 hour(s)).   Time coordinating discharge: Over 30 minutes  SIGNED:   Nolberto Hanlon, MD  [facility] 10/20/2021, 10:28 AM Pager   If 7PM-7AM, please contact night-coverage [email or web address] Password [email or web address]

## 2021-10-23 DIAGNOSIS — M316 Other giant cell arteritis: Secondary | ICD-10-CM | POA: Diagnosis not present

## 2021-10-23 DIAGNOSIS — E039 Hypothyroidism, unspecified: Secondary | ICD-10-CM | POA: Diagnosis not present

## 2021-10-23 DIAGNOSIS — S22000G Wedge compression fracture of unspecified thoracic vertebra, subsequent encounter for fracture with delayed healing: Secondary | ICD-10-CM | POA: Diagnosis not present

## 2021-10-23 DIAGNOSIS — M6281 Muscle weakness (generalized): Secondary | ICD-10-CM | POA: Diagnosis not present

## 2021-10-24 DIAGNOSIS — B182 Chronic viral hepatitis C: Secondary | ICD-10-CM | POA: Diagnosis not present

## 2021-10-24 DIAGNOSIS — M6281 Muscle weakness (generalized): Secondary | ICD-10-CM | POA: Diagnosis not present

## 2021-10-24 DIAGNOSIS — N1832 Chronic kidney disease, stage 3b: Secondary | ICD-10-CM | POA: Diagnosis not present

## 2021-10-24 DIAGNOSIS — S32020D Wedge compression fracture of second lumbar vertebra, subsequent encounter for fracture with routine healing: Secondary | ICD-10-CM | POA: Diagnosis not present

## 2021-10-26 DIAGNOSIS — S32020D Wedge compression fracture of second lumbar vertebra, subsequent encounter for fracture with routine healing: Secondary | ICD-10-CM | POA: Diagnosis not present

## 2021-10-26 DIAGNOSIS — M6281 Muscle weakness (generalized): Secondary | ICD-10-CM | POA: Diagnosis not present

## 2021-10-26 DIAGNOSIS — N1832 Chronic kidney disease, stage 3b: Secondary | ICD-10-CM | POA: Diagnosis not present

## 2021-10-27 DIAGNOSIS — M6281 Muscle weakness (generalized): Secondary | ICD-10-CM | POA: Diagnosis not present

## 2021-10-27 DIAGNOSIS — N1832 Chronic kidney disease, stage 3b: Secondary | ICD-10-CM | POA: Diagnosis not present

## 2021-10-27 DIAGNOSIS — S32020D Wedge compression fracture of second lumbar vertebra, subsequent encounter for fracture with routine healing: Secondary | ICD-10-CM | POA: Diagnosis not present

## 2021-10-30 DIAGNOSIS — S32020D Wedge compression fracture of second lumbar vertebra, subsequent encounter for fracture with routine healing: Secondary | ICD-10-CM | POA: Diagnosis not present

## 2021-10-30 DIAGNOSIS — I10 Essential (primary) hypertension: Secondary | ICD-10-CM | POA: Diagnosis not present

## 2021-10-30 DIAGNOSIS — M6281 Muscle weakness (generalized): Secondary | ICD-10-CM | POA: Diagnosis not present

## 2021-10-30 DIAGNOSIS — K5900 Constipation, unspecified: Secondary | ICD-10-CM | POA: Diagnosis not present

## 2021-10-31 DIAGNOSIS — M316 Other giant cell arteritis: Secondary | ICD-10-CM | POA: Diagnosis not present

## 2021-10-31 DIAGNOSIS — M545 Low back pain, unspecified: Secondary | ICD-10-CM | POA: Diagnosis not present

## 2021-10-31 DIAGNOSIS — K5900 Constipation, unspecified: Secondary | ICD-10-CM | POA: Diagnosis not present

## 2021-11-01 ENCOUNTER — Emergency Department: Payer: PPO

## 2021-11-01 ENCOUNTER — Observation Stay (HOSPITAL_COMMUNITY)
Admit: 2021-11-01 | Discharge: 2021-11-01 | Disposition: A | Discharge status: Home / Self Care | Payer: PPO | Attending: Internal Medicine | Admitting: Internal Medicine

## 2021-11-01 ENCOUNTER — Observation Stay: Payer: PPO

## 2021-11-01 ENCOUNTER — Inpatient Hospital Stay
Admission: EM | Admit: 2021-11-01 | Discharge: 2021-11-06 | DRG: 193 | Disposition: A | Discharge status: Skilled Nursing Facility | Payer: PPO | Source: Skilled Nursing Facility | Attending: Internal Medicine | Admitting: Internal Medicine

## 2021-11-01 ENCOUNTER — Encounter: Payer: Self-pay | Admitting: Emergency Medicine

## 2021-11-01 ENCOUNTER — Other Ambulatory Visit: Payer: Self-pay

## 2021-11-01 DIAGNOSIS — R0602 Shortness of breath: Secondary | ICD-10-CM | POA: Diagnosis not present

## 2021-11-01 DIAGNOSIS — N1832 Chronic kidney disease, stage 3b: Secondary | ICD-10-CM | POA: Diagnosis present

## 2021-11-01 DIAGNOSIS — R0902 Hypoxemia: Principal | ICD-10-CM

## 2021-11-01 DIAGNOSIS — Z7952 Long term (current) use of systemic steroids: Secondary | ICD-10-CM

## 2021-11-01 DIAGNOSIS — Z9181 History of falling: Secondary | ICD-10-CM | POA: Diagnosis not present

## 2021-11-01 DIAGNOSIS — E039 Hypothyroidism, unspecified: Secondary | ICD-10-CM | POA: Diagnosis not present

## 2021-11-01 DIAGNOSIS — M316 Other giant cell arteritis: Secondary | ICD-10-CM | POA: Diagnosis not present

## 2021-11-01 DIAGNOSIS — E569 Vitamin deficiency, unspecified: Secondary | ICD-10-CM | POA: Diagnosis not present

## 2021-11-01 DIAGNOSIS — Z79899 Other long term (current) drug therapy: Secondary | ICD-10-CM

## 2021-11-01 DIAGNOSIS — R5381 Other malaise: Secondary | ICD-10-CM | POA: Diagnosis not present

## 2021-11-01 DIAGNOSIS — Z79890 Hormone replacement therapy (postmenopausal): Secondary | ICD-10-CM

## 2021-11-01 DIAGNOSIS — J189 Pneumonia, unspecified organism: Principal | ICD-10-CM

## 2021-11-01 DIAGNOSIS — J9600 Acute respiratory failure, unspecified whether with hypoxia or hypercapnia: Secondary | ICD-10-CM | POA: Diagnosis not present

## 2021-11-01 DIAGNOSIS — I129 Hypertensive chronic kidney disease with stage 1 through stage 4 chronic kidney disease, or unspecified chronic kidney disease: Secondary | ICD-10-CM | POA: Diagnosis present

## 2021-11-01 DIAGNOSIS — I10 Essential (primary) hypertension: Secondary | ICD-10-CM | POA: Diagnosis present

## 2021-11-01 DIAGNOSIS — M6259 Muscle wasting and atrophy, not elsewhere classified, multiple sites: Secondary | ICD-10-CM | POA: Diagnosis not present

## 2021-11-01 DIAGNOSIS — M5416 Radiculopathy, lumbar region: Secondary | ICD-10-CM | POA: Diagnosis not present

## 2021-11-01 DIAGNOSIS — Z90710 Acquired absence of both cervix and uterus: Secondary | ICD-10-CM

## 2021-11-01 DIAGNOSIS — R778 Other specified abnormalities of plasma proteins: Secondary | ICD-10-CM

## 2021-11-01 DIAGNOSIS — M545 Low back pain, unspecified: Secondary | ICD-10-CM | POA: Diagnosis not present

## 2021-11-01 DIAGNOSIS — R768 Other specified abnormal immunological findings in serum: Secondary | ICD-10-CM | POA: Diagnosis present

## 2021-11-01 DIAGNOSIS — I248 Other forms of acute ischemic heart disease: Secondary | ICD-10-CM | POA: Diagnosis present

## 2021-11-01 DIAGNOSIS — G8929 Other chronic pain: Secondary | ICD-10-CM | POA: Diagnosis not present

## 2021-11-01 DIAGNOSIS — Z741 Need for assistance with personal care: Secondary | ICD-10-CM | POA: Diagnosis not present

## 2021-11-01 DIAGNOSIS — R791 Abnormal coagulation profile: Secondary | ICD-10-CM | POA: Diagnosis not present

## 2021-11-01 DIAGNOSIS — R9431 Abnormal electrocardiogram [ECG] [EKG]: Secondary | ICD-10-CM | POA: Diagnosis not present

## 2021-11-01 DIAGNOSIS — I4510 Unspecified right bundle-branch block: Secondary | ICD-10-CM | POA: Diagnosis present

## 2021-11-01 DIAGNOSIS — J9621 Acute and chronic respiratory failure with hypoxia: Secondary | ICD-10-CM | POA: Diagnosis present

## 2021-11-01 DIAGNOSIS — Z87891 Personal history of nicotine dependence: Secondary | ICD-10-CM | POA: Diagnosis not present

## 2021-11-01 DIAGNOSIS — M4856XA Collapsed vertebra, not elsewhere classified, lumbar region, initial encounter for fracture: Secondary | ICD-10-CM | POA: Diagnosis not present

## 2021-11-01 DIAGNOSIS — N179 Acute kidney failure, unspecified: Secondary | ICD-10-CM | POA: Diagnosis not present

## 2021-11-01 DIAGNOSIS — M7122 Synovial cyst of popliteal space [Baker], left knee: Secondary | ICD-10-CM | POA: Diagnosis not present

## 2021-11-01 DIAGNOSIS — M549 Dorsalgia, unspecified: Secondary | ICD-10-CM | POA: Diagnosis not present

## 2021-11-01 DIAGNOSIS — J9601 Acute respiratory failure with hypoxia: Secondary | ICD-10-CM | POA: Diagnosis present

## 2021-11-01 DIAGNOSIS — M8430XA Stress fracture, unspecified site, initial encounter for fracture: Secondary | ICD-10-CM | POA: Diagnosis not present

## 2021-11-01 DIAGNOSIS — K5900 Constipation, unspecified: Secondary | ICD-10-CM | POA: Diagnosis not present

## 2021-11-01 DIAGNOSIS — R52 Pain, unspecified: Secondary | ICD-10-CM | POA: Diagnosis not present

## 2021-11-01 DIAGNOSIS — R251 Tremor, unspecified: Secondary | ICD-10-CM | POA: Diagnosis not present

## 2021-11-01 DIAGNOSIS — R2681 Unsteadiness on feet: Secondary | ICD-10-CM | POA: Diagnosis not present

## 2021-11-01 DIAGNOSIS — S3210XS Unspecified fracture of sacrum, sequela: Secondary | ICD-10-CM | POA: Diagnosis not present

## 2021-11-01 DIAGNOSIS — Z743 Need for continuous supervision: Secondary | ICD-10-CM | POA: Diagnosis not present

## 2021-11-01 DIAGNOSIS — S32020D Wedge compression fracture of second lumbar vertebra, subsequent encounter for fracture with routine healing: Secondary | ICD-10-CM | POA: Diagnosis not present

## 2021-11-01 DIAGNOSIS — B1920 Unspecified viral hepatitis C without hepatic coma: Secondary | ICD-10-CM | POA: Diagnosis present

## 2021-11-01 DIAGNOSIS — M546 Pain in thoracic spine: Secondary | ICD-10-CM | POA: Diagnosis not present

## 2021-11-01 DIAGNOSIS — R7989 Other specified abnormal findings of blood chemistry: Secondary | ICD-10-CM

## 2021-11-01 DIAGNOSIS — I2699 Other pulmonary embolism without acute cor pulmonale: Secondary | ICD-10-CM | POA: Diagnosis not present

## 2021-11-01 DIAGNOSIS — D72829 Elevated white blood cell count, unspecified: Secondary | ICD-10-CM | POA: Diagnosis present

## 2021-11-01 LAB — CBC WITH DIFFERENTIAL/PLATELET
Abs Immature Granulocytes: 0.6 10*3/uL — ABNORMAL HIGH (ref 0.00–0.07)
Basophils Absolute: 0.1 10*3/uL (ref 0.0–0.1)
Basophils Relative: 1 %
Eosinophils Absolute: 0 10*3/uL (ref 0.0–0.5)
Eosinophils Relative: 0 %
HCT: 40.7 % (ref 36.0–46.0)
Hemoglobin: 13 g/dL (ref 12.0–15.0)
Immature Granulocytes: 4 %
Lymphocytes Relative: 6 %
Lymphs Abs: 1.1 10*3/uL (ref 0.7–4.0)
MCH: 31 pg (ref 26.0–34.0)
MCHC: 31.9 g/dL (ref 30.0–36.0)
MCV: 96.9 fL (ref 80.0–100.0)
Monocytes Absolute: 0.6 10*3/uL (ref 0.1–1.0)
Monocytes Relative: 4 %
Neutro Abs: 14.8 10*3/uL — ABNORMAL HIGH (ref 1.7–7.7)
Neutrophils Relative %: 85 %
Platelets: 356 10*3/uL (ref 150–400)
RBC: 4.2 MIL/uL (ref 3.87–5.11)
RDW: 14.1 % (ref 11.5–15.5)
WBC: 17.2 10*3/uL — ABNORMAL HIGH (ref 4.0–10.5)
nRBC: 0.4 % — ABNORMAL HIGH (ref 0.0–0.2)

## 2021-11-01 LAB — COMPREHENSIVE METABOLIC PANEL
ALT: 15 U/L (ref 0–44)
AST: 19 U/L (ref 15–41)
Albumin: 2.9 g/dL — ABNORMAL LOW (ref 3.5–5.0)
Alkaline Phosphatase: 90 U/L (ref 38–126)
Anion gap: 10 (ref 5–15)
BUN: 32 mg/dL — ABNORMAL HIGH (ref 8–23)
CO2: 25 mmol/L (ref 22–32)
Calcium: 9.4 mg/dL (ref 8.9–10.3)
Chloride: 101 mmol/L (ref 98–111)
Creatinine, Ser: 1.81 mg/dL — ABNORMAL HIGH (ref 0.44–1.00)
GFR, Estimated: 27 mL/min — ABNORMAL LOW (ref >60)
Glucose, Bld: 102 mg/dL — ABNORMAL HIGH (ref 70–99)
Potassium: 4.3 mmol/L (ref 3.5–5.1)
Sodium: 136 mmol/L (ref 135–145)
Total Bilirubin: 0.4 mg/dL (ref 0.3–1.2)
Total Protein: 6.2 g/dL — ABNORMAL LOW (ref 6.5–8.1)

## 2021-11-01 LAB — URINALYSIS, ROUTINE W REFLEX MICROSCOPIC
Bilirubin Urine: NEGATIVE
Glucose, UA: NEGATIVE mg/dL
Hgb urine dipstick: NEGATIVE
Ketones, ur: NEGATIVE mg/dL
Leukocytes,Ua: NEGATIVE
Nitrite: NEGATIVE
Protein, ur: NEGATIVE mg/dL
Specific Gravity, Urine: 1.018 (ref 1.005–1.030)
pH: 6 (ref 5.0–8.0)

## 2021-11-01 LAB — TROPONIN I (HIGH SENSITIVITY)
Troponin I (High Sensitivity): 45 ng/L — ABNORMAL HIGH (ref <18)
Troponin I (High Sensitivity): 48 ng/L — ABNORMAL HIGH (ref <18)

## 2021-11-01 LAB — PROCALCITONIN: Procalcitonin: 1.05 ng/mL

## 2021-11-01 LAB — D-DIMER, QUANTITATIVE: D-Dimer, Quant: 2.58 ug/mL-FEU — ABNORMAL HIGH (ref 0.00–0.50)

## 2021-11-01 LAB — PROTIME-INR
INR: 1.1 (ref 0.8–1.2)
Prothrombin Time: 14.2 seconds (ref 11.4–15.2)

## 2021-11-01 LAB — BRAIN NATRIURETIC PEPTIDE: B Natriuretic Peptide: 228 pg/mL — ABNORMAL HIGH (ref 0.0–100.0)

## 2021-11-01 LAB — APTT: aPTT: 27 seconds (ref 24–36)

## 2021-11-01 IMAGING — NM NM PULMONARY PERF PARTICULATE
1 series · 8 of 8 positions shown · non-contrast
Comparison: Chest radiographs done earlier today

CLINICAL DATA: Positive D-dimer, back pain, recent surgery

EXAM:
NUCLEAR MEDICINE PERFUSION LUNG SCAN
TECHNIQUE: Perfusion images were obtained in multiple projections after
intravenous injection of radiopharmaceutical.
Ventilation scans intentionally deferred if perfusion scan and chest
x-ray adequate for interpretation during COVID 19 epidemic.
RADIOPHARMACEUTICALS:  4.13 mCi [9N] MAA IV

[Series 1000: lung perfusion · 1.65mm/px · 4 acquisitions, 8 frames shown]
[im 1/4]
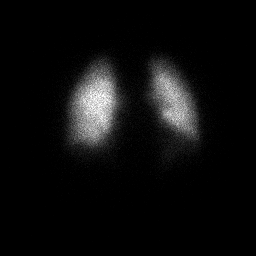
[im 1/4]
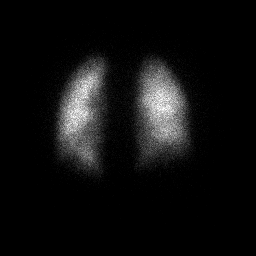
[im 2/4]
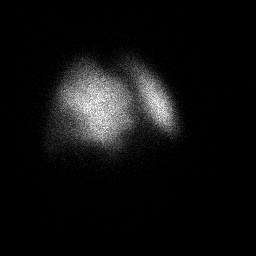
[im 2/4]
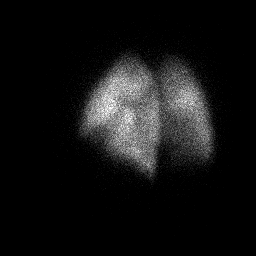
[im 3/4  full-range]
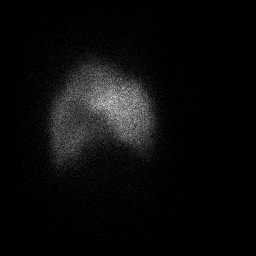
[im 3/4  full-range]
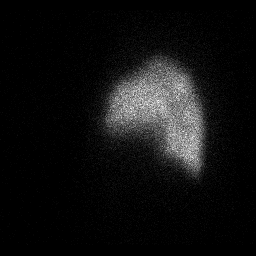
[im 4/4]
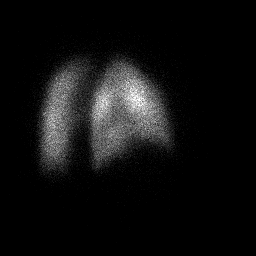
[im 4/4]
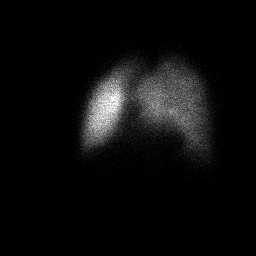

[8 of 8 positions shown; findings below may reference images not displayed]

FINDINGS: There are no discrete wedge-shaped focal perfusion defects. There
are linear nonsegmental areas of decreased activity in the left mid
and left lower lung fields.
IMPRESSION: Low probability for pulmonary embolism.

## 2021-11-01 IMAGING — CR DG THORACIC SPINE 2V
3 series · 3 of 3 positions shown · non-contrast
Comparison: None Available.

CLINICAL DATA: Back pain.

EXAM:
THORACIC SPINE 2 VIEWS

[t-spine ap]
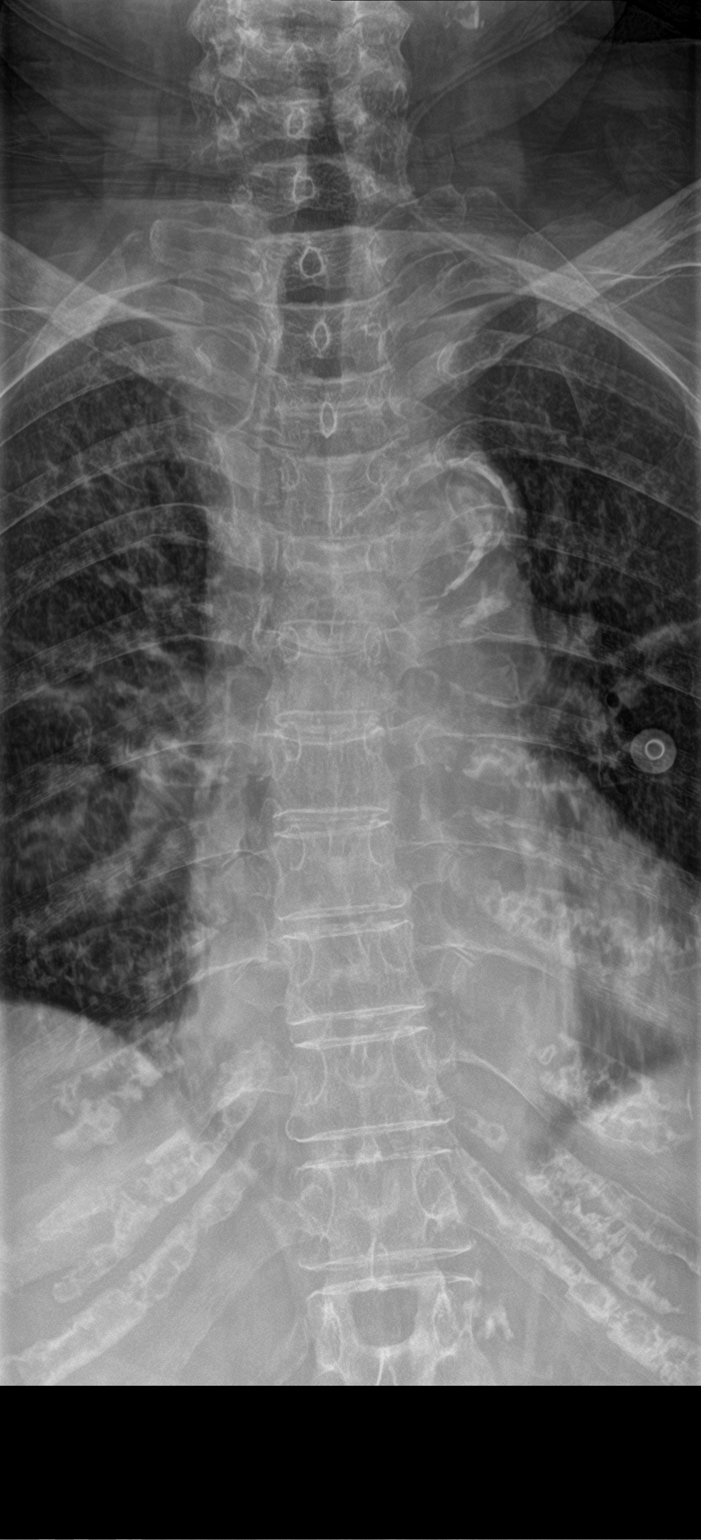

[t-spine lat]
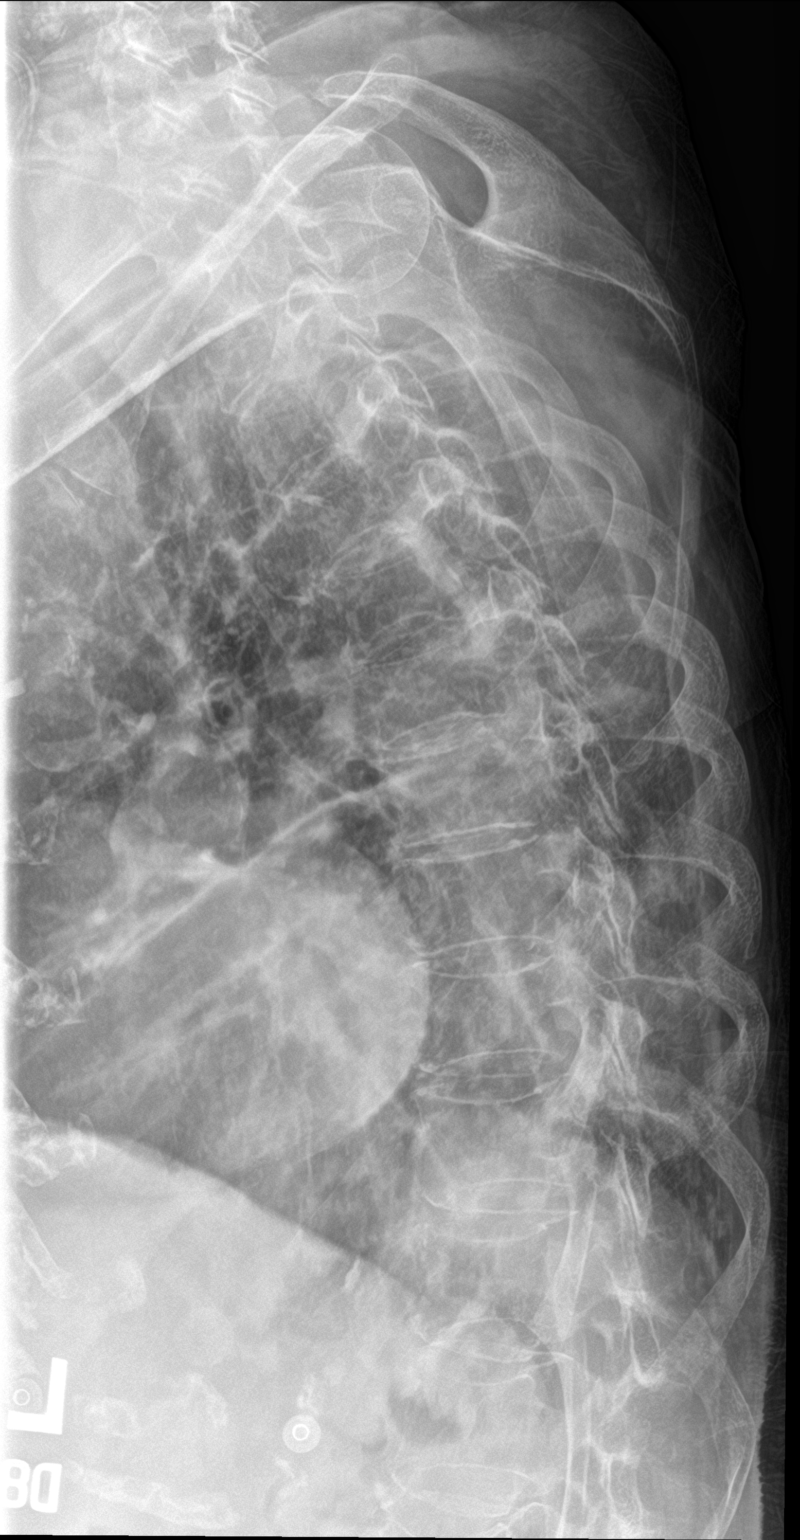

[t-spine swimmers]
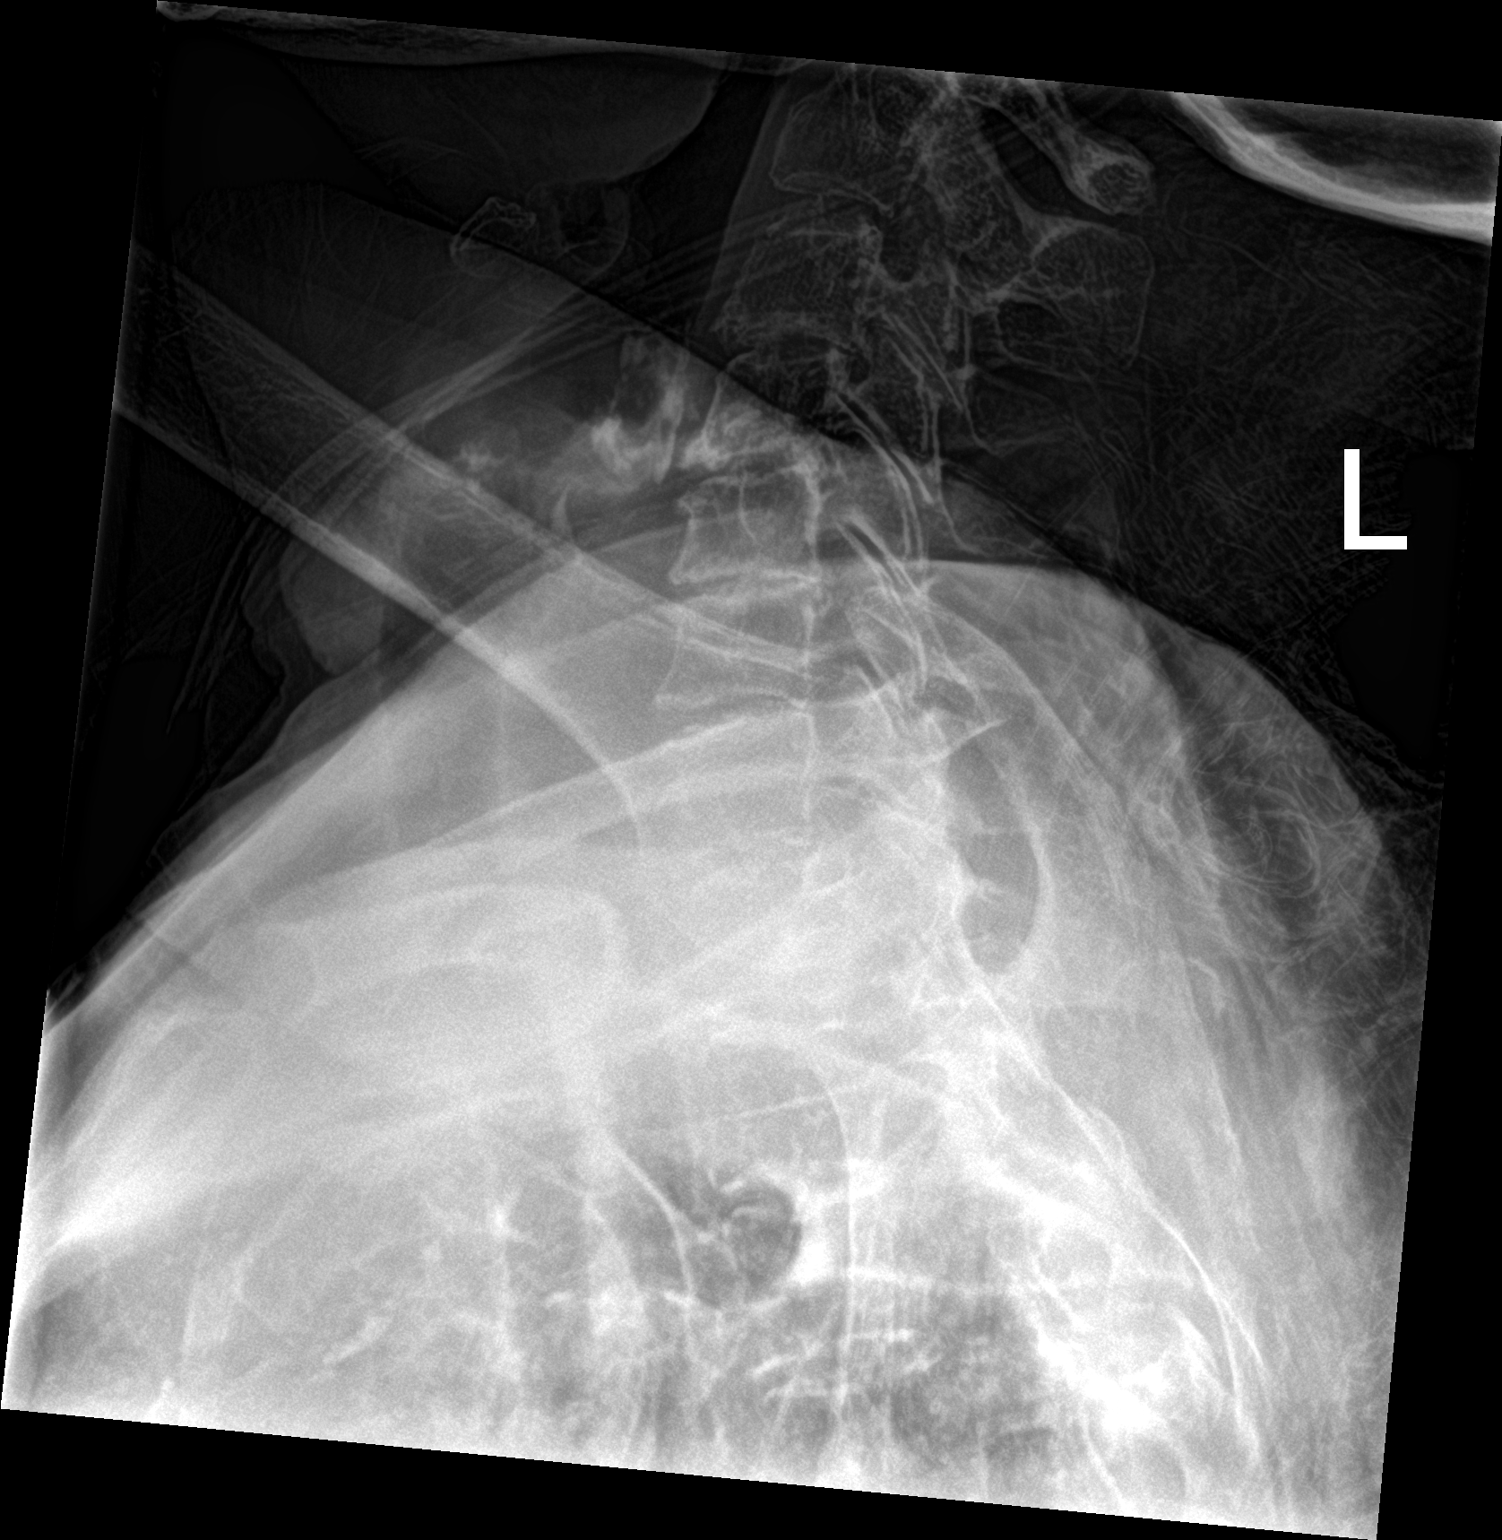

[3 of 3 positions shown; findings below may reference images not displayed]

FINDINGS: There is no evidence of thoracic spine fracture. Alignment is
normal. No other significant bone abnormalities are identified.
IMPRESSION: Negative.

## 2021-11-01 IMAGING — CR DG LUMBAR SPINE COMPLETE 4+V
5 series · 5 of 5 positions shown · non-contrast
Comparison: [DATE].

CLINICAL DATA: Lower back pain.

EXAM:
LUMBAR SPINE - COMPLETE 4+ VIEW

[l-spine ap]
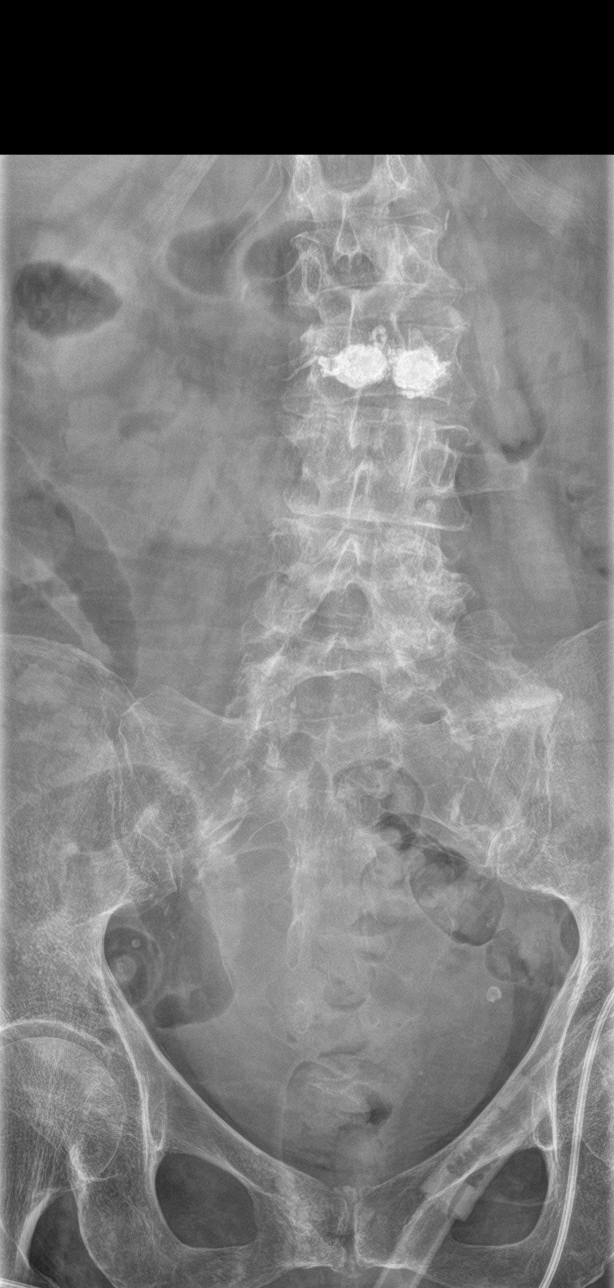

[l-spine obl (1 of 2)]
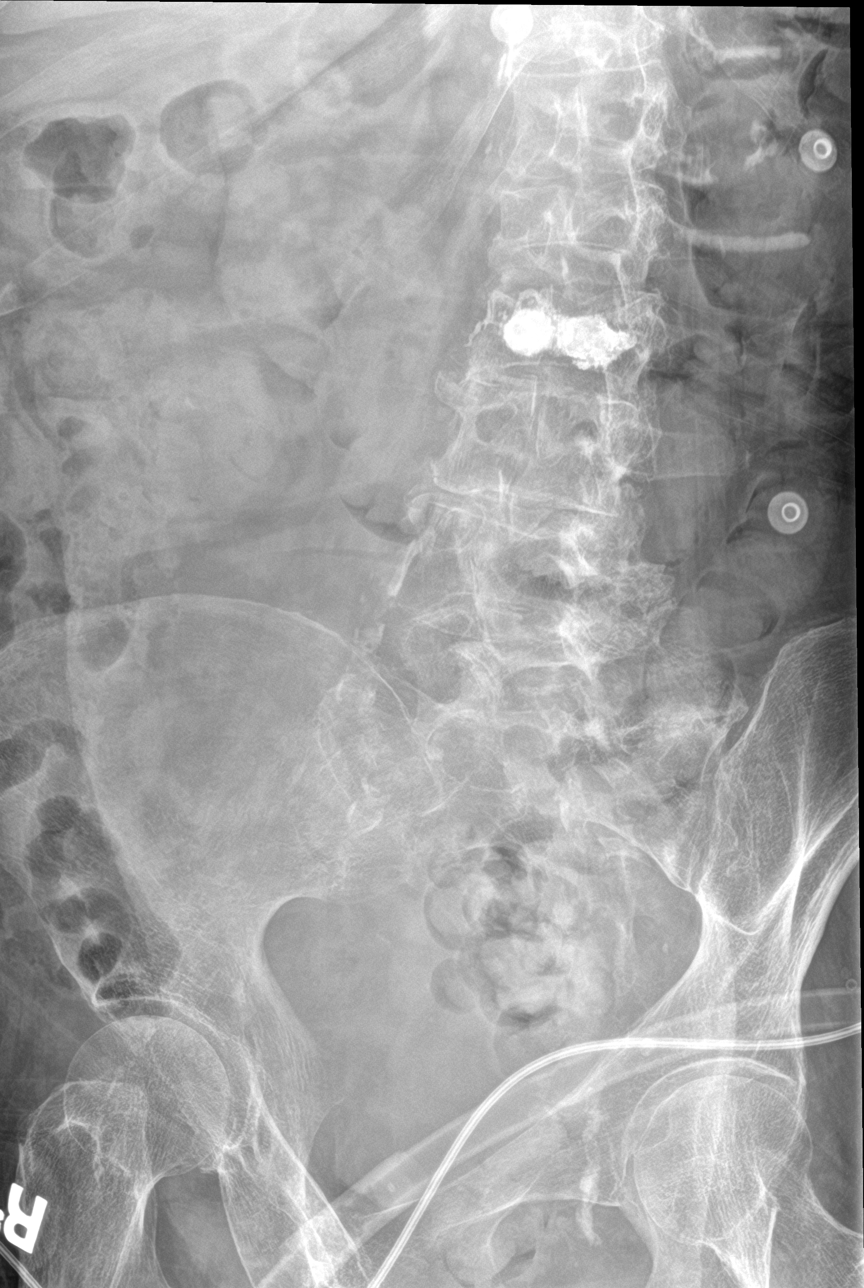

[l-spine obl (2 of 2)]
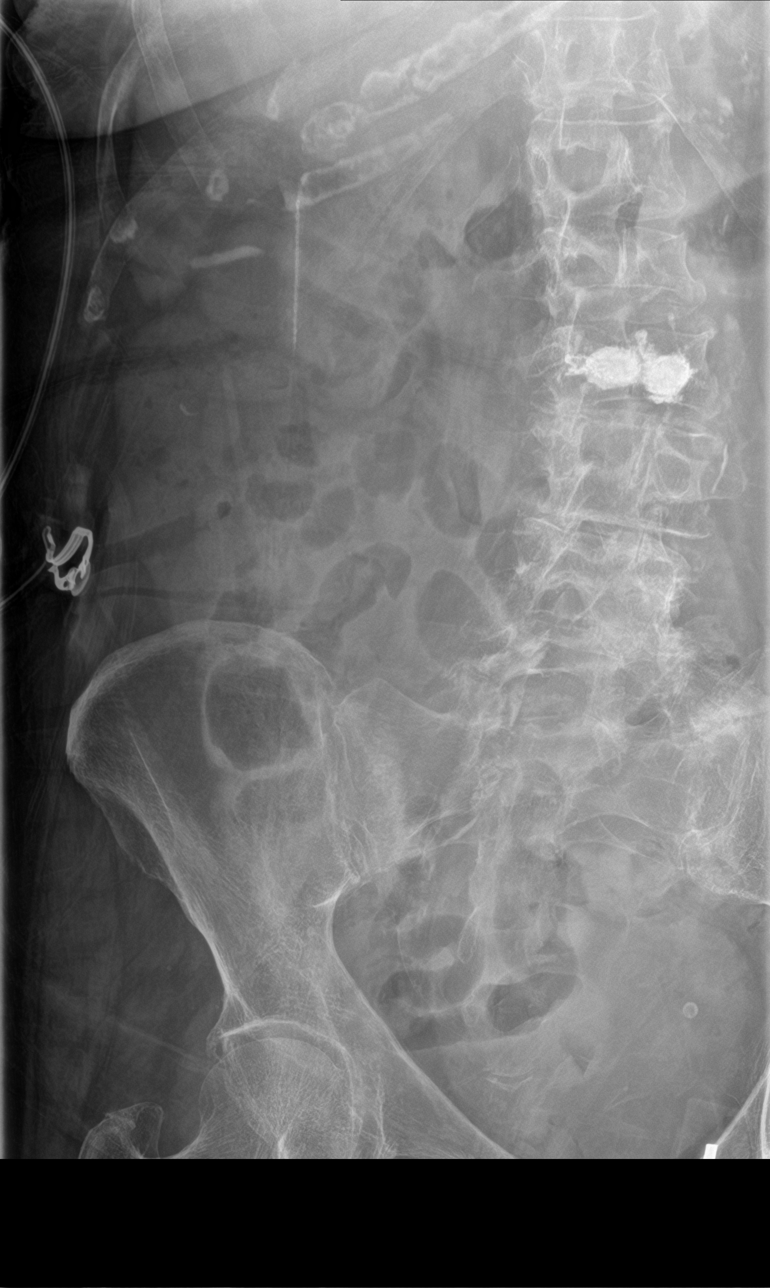

[l-spine lat]
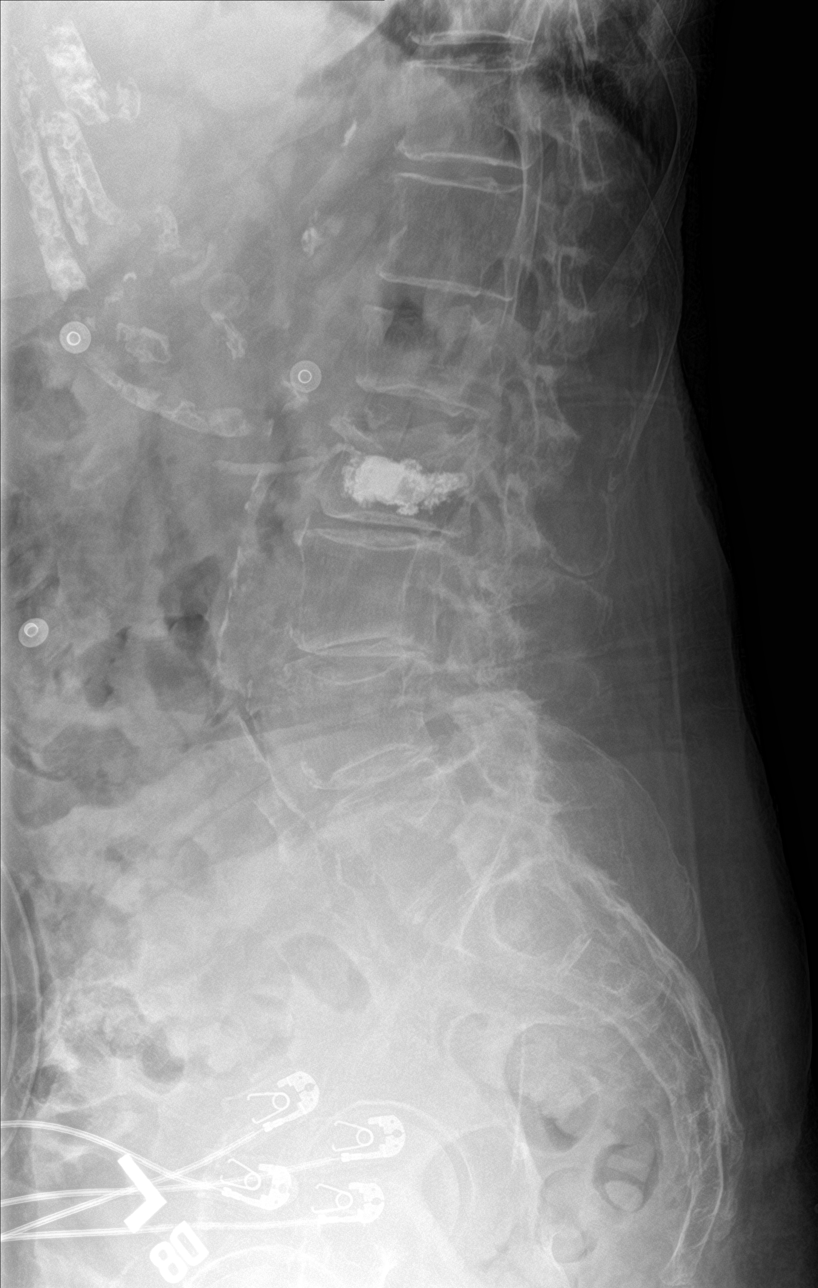

[l-spine spot]
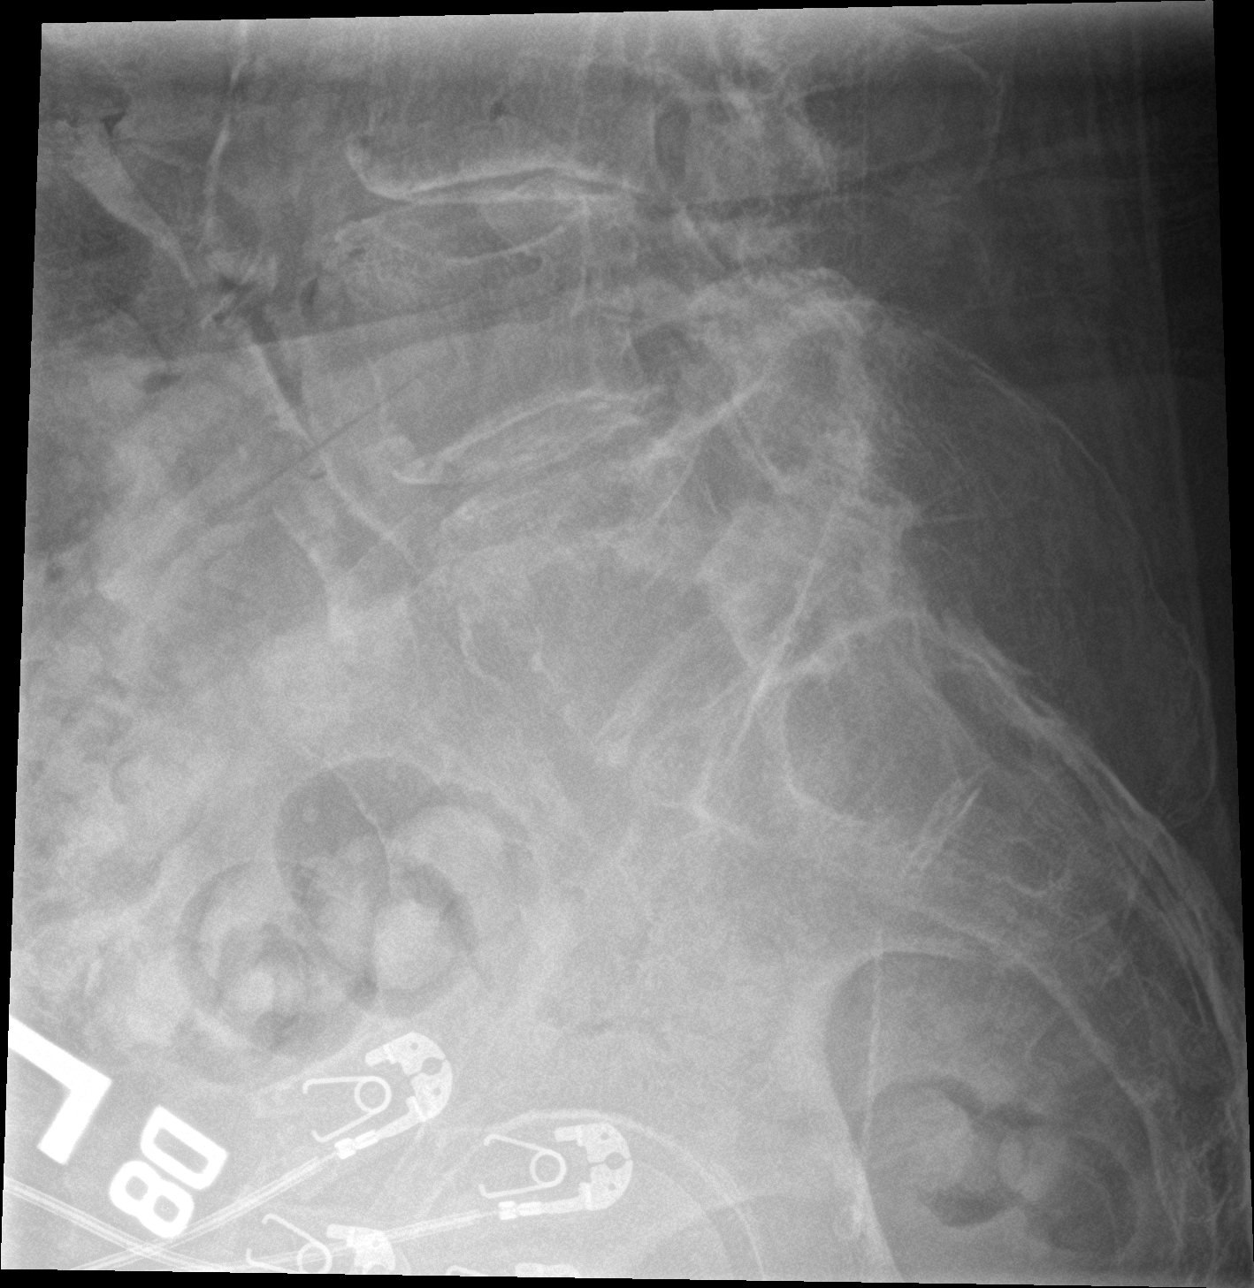

[5 of 5 positions shown; findings below may reference images not displayed]

FINDINGS: Status post L2 kyphoplasty. There is now noted mild compression
deformity L1 vertebral body concerning for acute fracture. Moderate
degenerative disc disease is noted at L2-3 and L3-4.
IMPRESSION: Status post L2 kyphoplasty. Mild compression deformity of L1
vertebral body is now noted concerning for acute fracture.

## 2021-11-01 IMAGING — CR DG CHEST 2V
2 series · 2 of 2 positions shown · non-contrast
Comparison: None Available.

CLINICAL DATA: Shortness of breath.

EXAM:
CHEST - 2 VIEW

[chest lat]
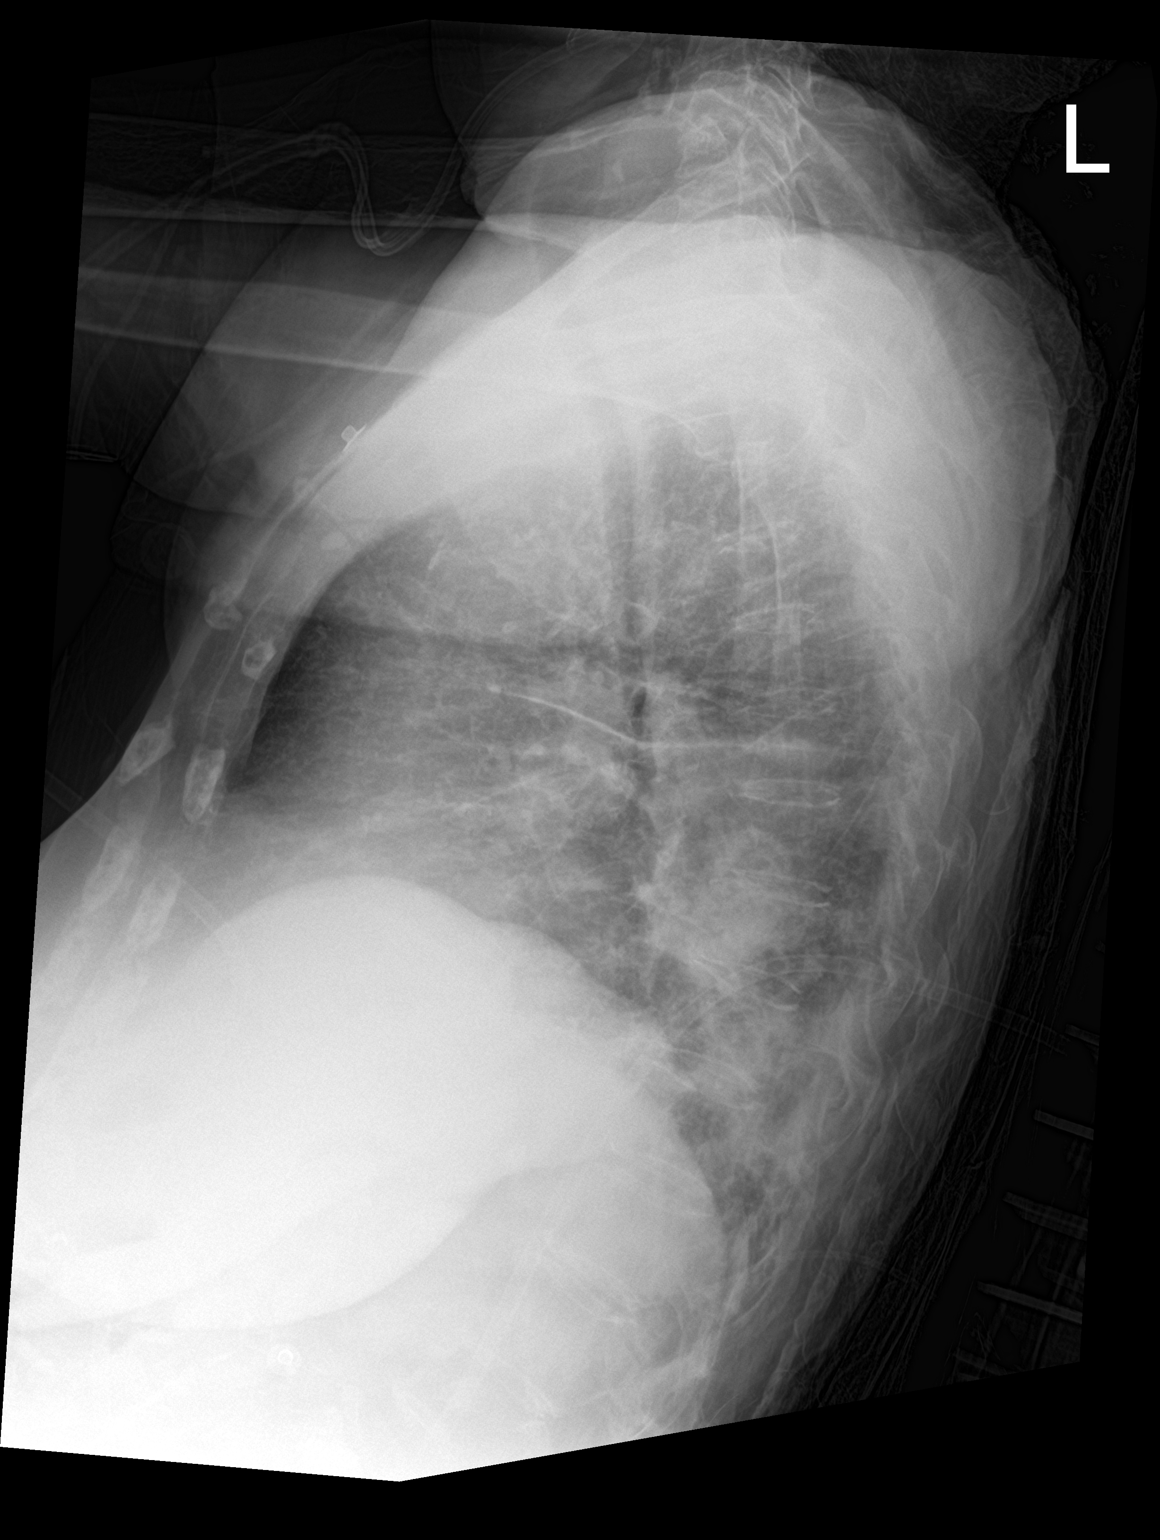

[chest ap]
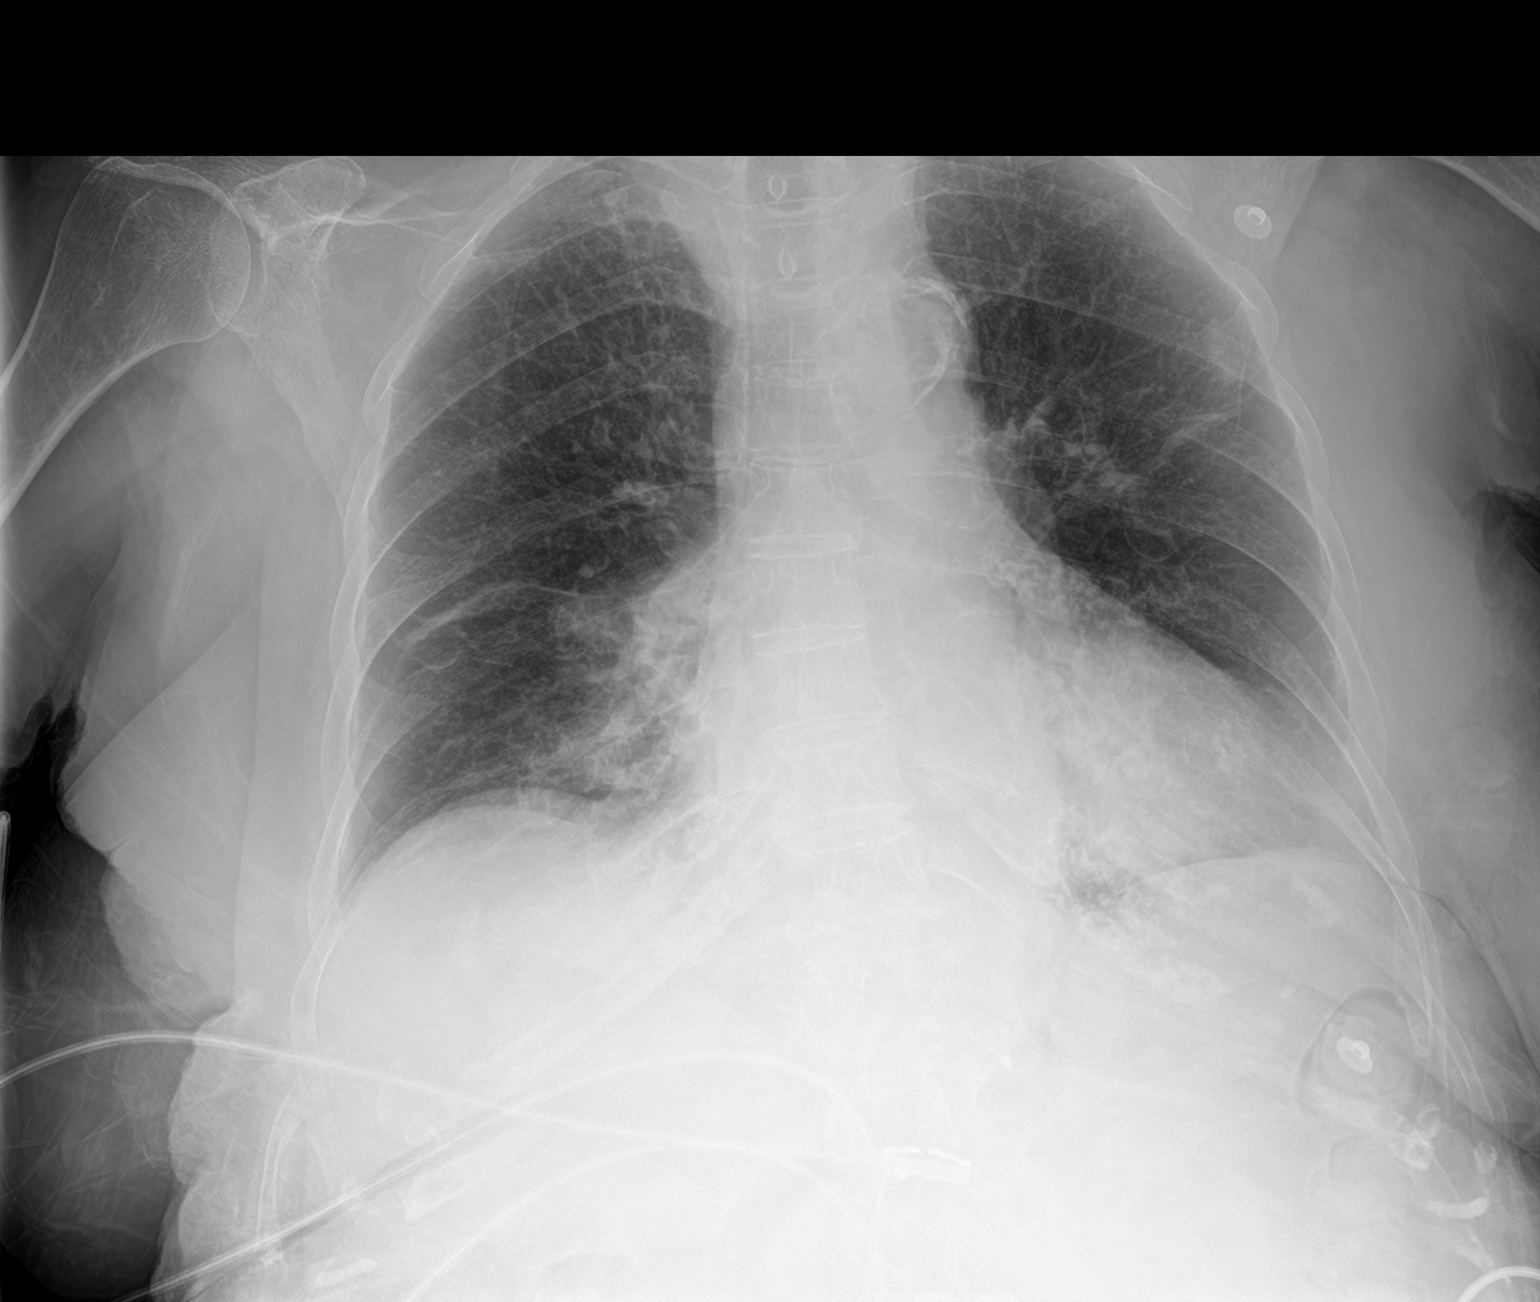

[2 of 2 positions shown; findings below may reference images not displayed]

FINDINGS: The heart size and mediastinal contours are within normal limits.
Mild bibasilar opacities are noted concerning for possible edema or
infiltrates. No pneumothorax or pleural effusion is noted. The
visualized skeletal structures are unremarkable.
IMPRESSION: Mild bibasilar opacities are noted concerning for edema or possibly
infiltrates.

Aortic Atherosclerosis ([Y9]-[Y9]).

## 2021-11-01 IMAGING — US US EXTREM LOW VENOUS
1 series · 13 of 24 positions shown · non-contrast
Comparison: None Available.

CLINICAL DATA: Hypoxia



[Series 1: us venous img lower bilat (dvt) · portal-venous · 13 of 60 slices shown]
[im 1/60]
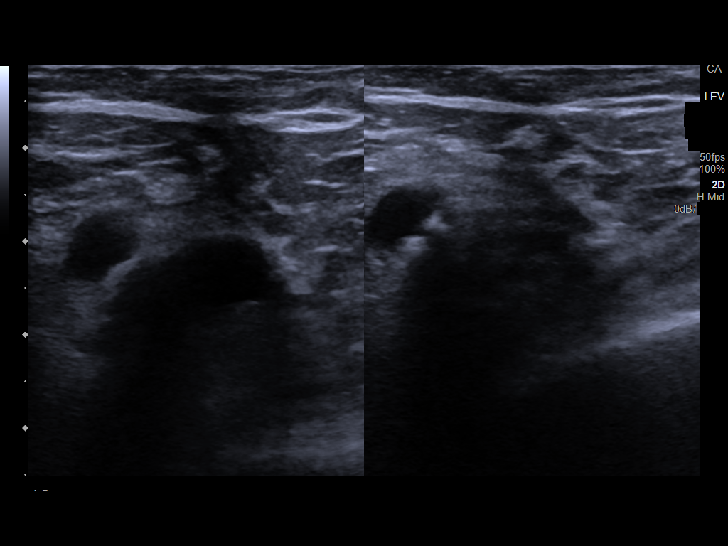
[im 6/60]
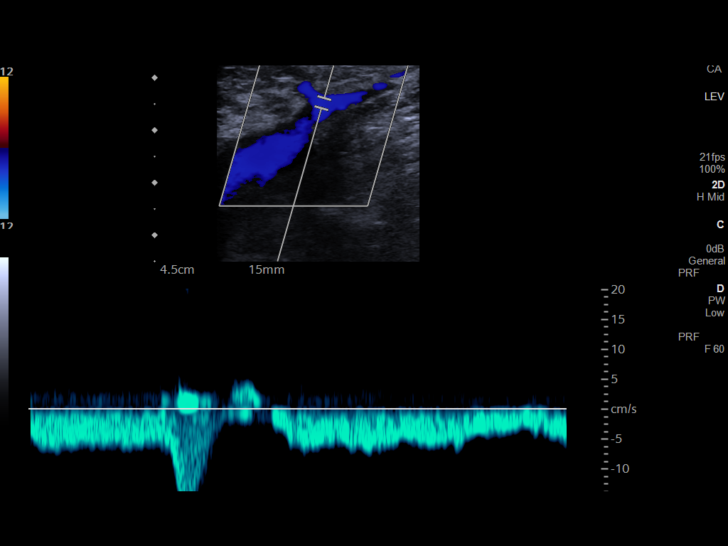
[im 11/60]
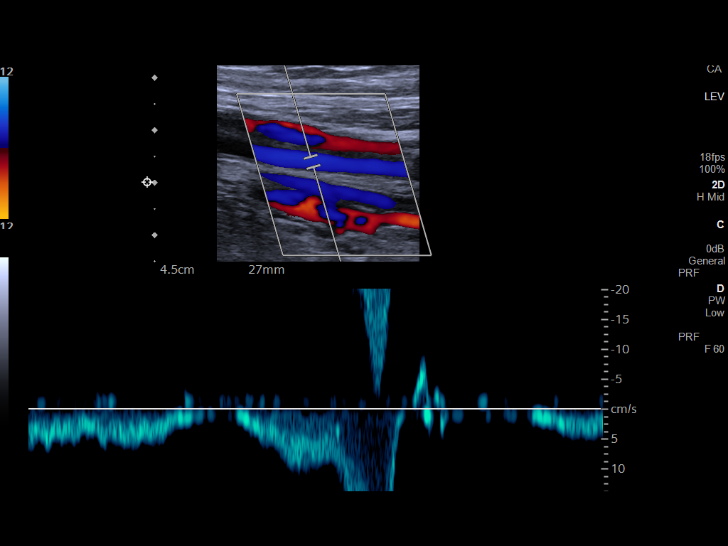
[im 16/60]
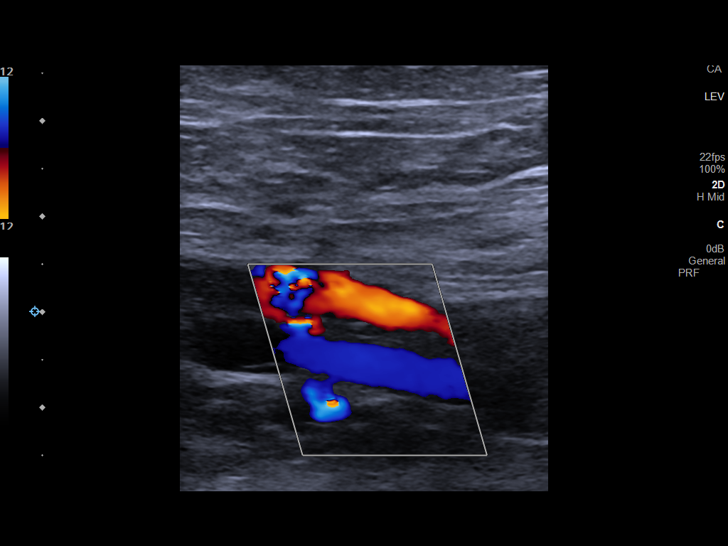
[im 21/60]
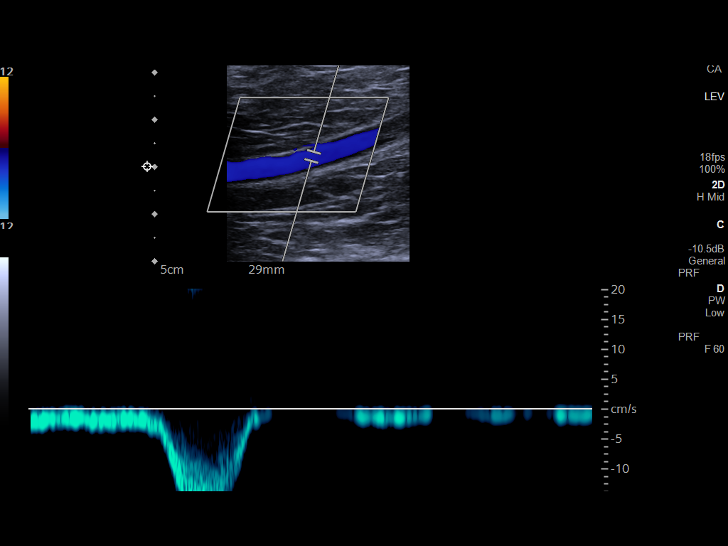
[im 26/60]
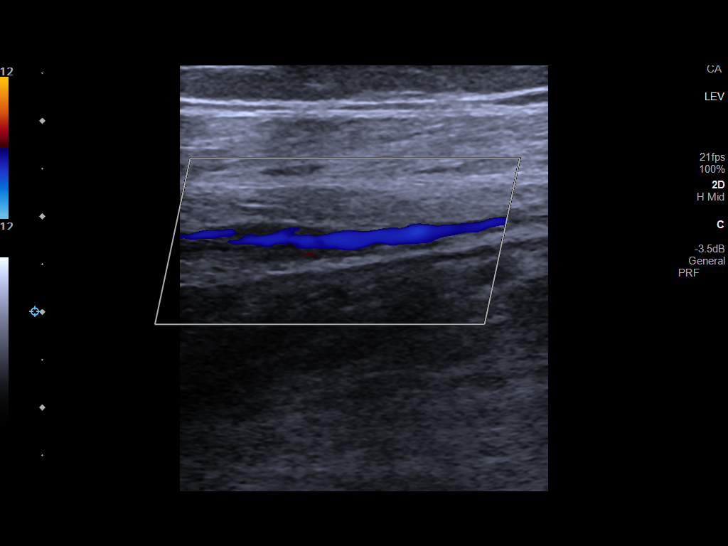
[im 31/60]
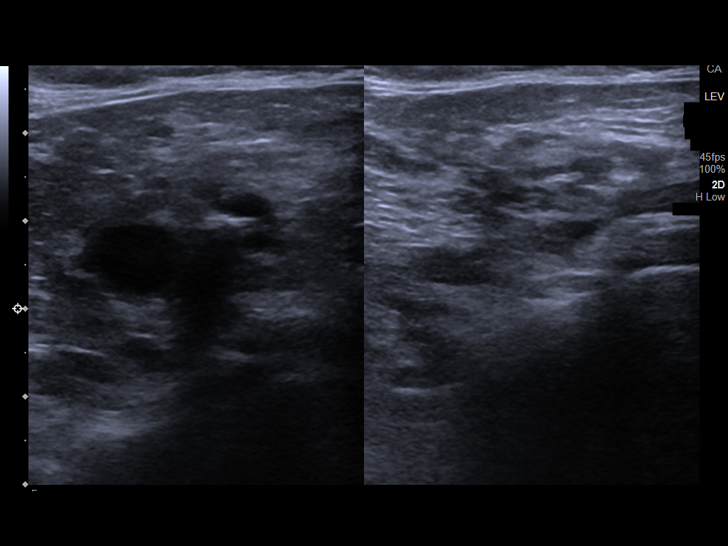
[im 34/60]
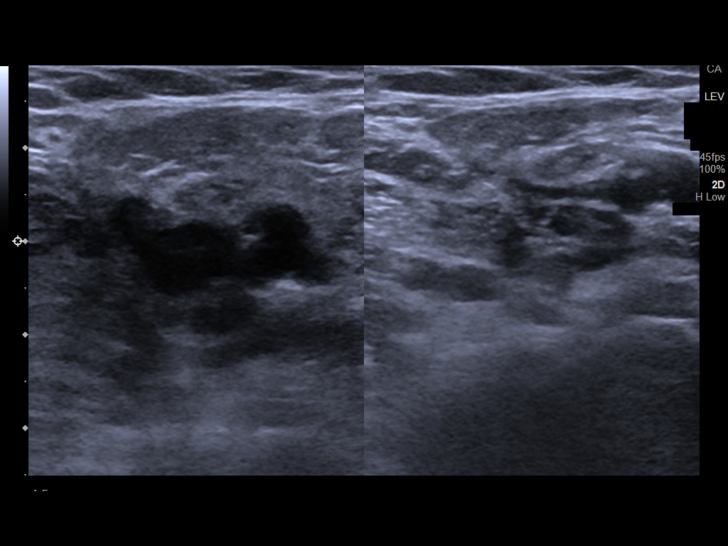
[im 39/60]
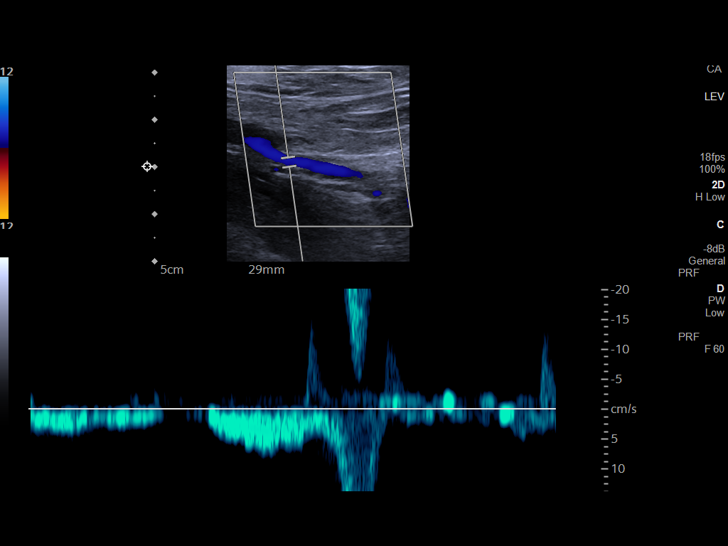
[im 44/60]
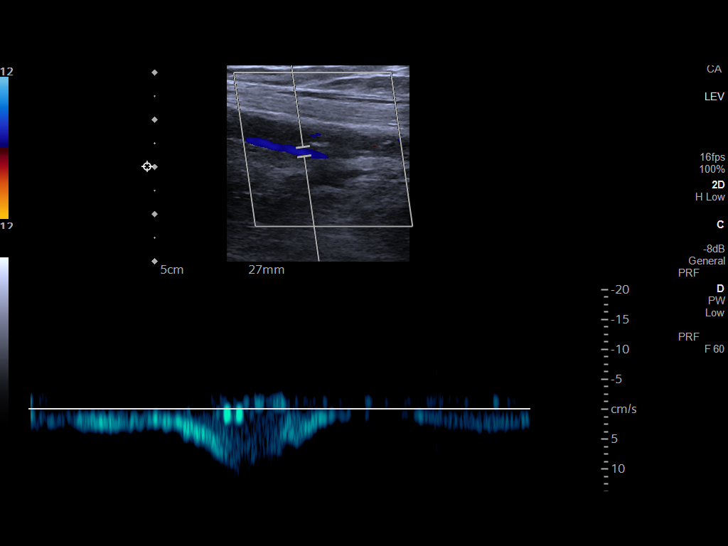
[im 49/60]
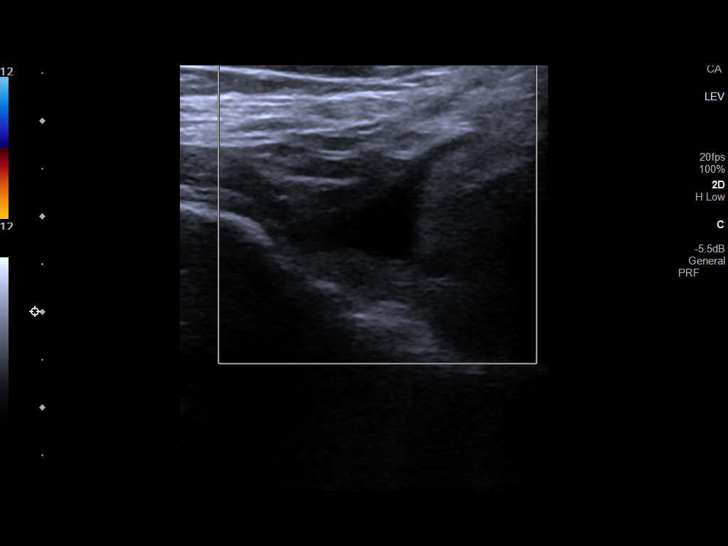
[im 54/60]
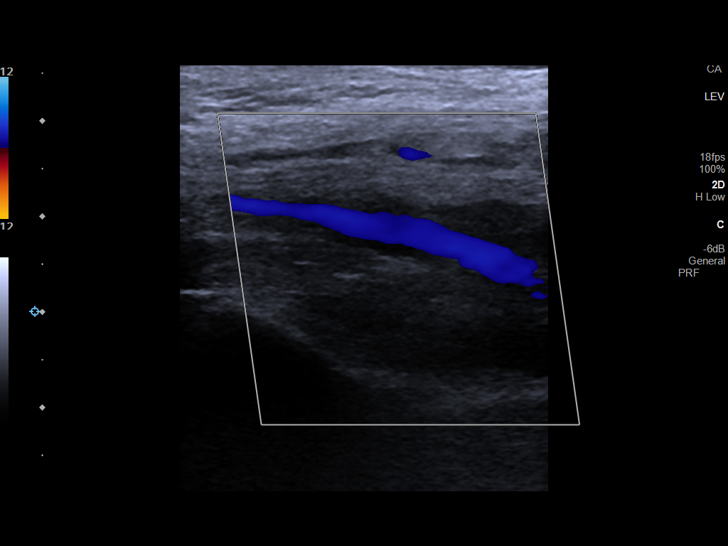
[im 60/60]
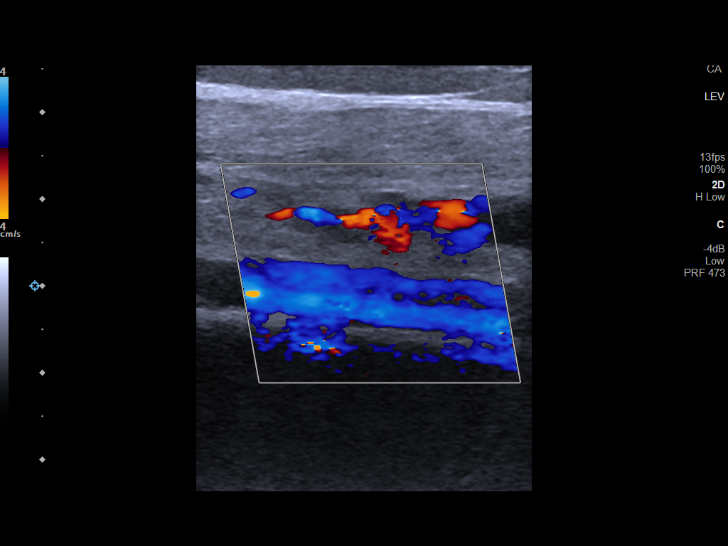

[13 of 24 positions shown; findings below may reference images not displayed]

FINDINGS: RIGHT LOWER EXTREMITY

Common Femoral Vein: No evidence of thrombus. Normal
compressibility, respiratory phasicity and response to augmentation.

Saphenofemoral Junction: No evidence of thrombus. Normal
compressibility and flow on color Doppler imaging.

Profunda Femoral Vein: No evidence of thrombus. Normal
compressibility and flow on color Doppler imaging.

Femoral Vein: No evidence of thrombus. Normal compressibility,
respiratory phasicity and response to augmentation.

Popliteal Vein: No evidence of thrombus. Normal compressibility,
respiratory phasicity and response to augmentation.

Calf Veins: No evidence of thrombus. Normal compressibility and flow
on color Doppler imaging.

Superficial Great Saphenous Vein: No evidence of thrombus. Normal
compressibility.

Venous Reflux:  None.

Other Findings:  None.

LEFT LOWER EXTREMITY

Common Femoral Vein: No evidence of thrombus. Normal
compressibility, respiratory phasicity and response to augmentation.

Saphenofemoral Junction: No evidence of thrombus. Normal
compressibility and flow on color Doppler imaging.

Profunda Femoral Vein: No evidence of thrombus. Normal
compressibility and flow on color Doppler imaging.

Femoral Vein: No evidence of thrombus. Normal compressibility,
respiratory phasicity and response to augmentation.

Popliteal Vein: No evidence of thrombus. Normal compressibility,
respiratory phasicity and response to augmentation.

Calf Veins: No evidence of thrombus. Normal compressibility and flow
on color Doppler imaging.

Superficial Great Saphenous Vein: No evidence of thrombus. Normal
compressibility.

Venous Reflux:  None.

Other Findings:  Left popliteal cyst measures 2.6 x 0.7 x 1.6 cm.
IMPRESSION: No evidence of deep venous thrombosis in either lower extremity.

## 2021-11-01 MED ORDER — PREDNISONE 50 MG PO TABS: 25.0000 mg | ORAL_TABLET | Freq: Every day | ORAL | Status: DC

## 2021-11-01 MED ORDER — PERFLUTREN LIPID MICROSPHERE
1.0000 mL | INTRAVENOUS | Status: AC | PRN
  Administered 2021-11-01: 2 mL via INTRAVENOUS

## 2021-11-01 MED ORDER — MAGNESIUM OXIDE -MG SUPPLEMENT 400 (240 MG) MG PO TABS
400.0000 mg | ORAL_TABLET | Freq: Every day | ORAL | Status: DC
  Administered 2021-11-02 – 2021-11-06 (×5): 400 mg via ORAL
  Filled 2021-11-01 (×5): qty 1

## 2021-11-01 MED ORDER — ACETAMINOPHEN 650 MG RE SUPP: 650.0000 mg | Freq: Four times a day (QID) | RECTAL | Status: DC | PRN

## 2021-11-01 MED ORDER — ACETAMINOPHEN 325 MG PO TABS
650.0000 mg | ORAL_TABLET | Freq: Four times a day (QID) | ORAL | Status: DC | PRN
  Administered 2021-11-01: 650 mg via ORAL
  Filled 2021-11-01: qty 2

## 2021-11-01 MED ORDER — AZITHROMYCIN 250 MG PO TABS
250.0000 mg | ORAL_TABLET | Freq: Every day | ORAL | Status: DC
  Administered 2021-11-02: 250 mg via ORAL
  Filled 2021-11-01: qty 1

## 2021-11-01 MED ORDER — HEPARIN (PORCINE) 25000 UT/250ML-% IV SOLN: 1100.0000 [IU]/h | INTRAVENOUS | Status: DC

## 2021-11-01 MED ORDER — SENNOSIDES-DOCUSATE SODIUM 8.6-50 MG PO TABS: 1.0000 | ORAL_TABLET | Freq: Every evening | ORAL | Status: DC | PRN

## 2021-11-01 MED ORDER — CYCLOBENZAPRINE HCL 10 MG PO TABS
5.0000 mg | ORAL_TABLET | Freq: Three times a day (TID) | ORAL | Status: DC | PRN
Start: 2021-11-01 — End: 2021-11-06
  Administered 2021-11-01 – 2021-11-05 (×5): 5 mg via ORAL
  Filled 2021-11-01 (×5): qty 1

## 2021-11-01 MED ORDER — SODIUM CHLORIDE 0.9 % IV SOLN
1.0000 g | Freq: Once | INTRAVENOUS | Status: AC
  Administered 2021-11-01: 1 g via INTRAVENOUS
  Filled 2021-11-01: qty 10

## 2021-11-01 MED ORDER — SODIUM CHLORIDE 0.9 % IV SOLN
500.0000 mg | Freq: Once | INTRAVENOUS | Status: AC
  Administered 2021-11-01: 500 mg via INTRAVENOUS
  Filled 2021-11-01: qty 5

## 2021-11-01 MED ORDER — LACTATED RINGERS IV SOLN: INTRAVENOUS | Status: DC

## 2021-11-01 MED ORDER — ENOXAPARIN SODIUM 80 MG/0.8ML IJ SOSY
1.0000 mg/kg | PREFILLED_SYRINGE | Freq: Two times a day (BID) | INTRAMUSCULAR | Status: DC
  Administered 2021-11-01 – 2021-11-02 (×2): 65 mg via SUBCUTANEOUS
  Filled 2021-11-01 (×4): qty 0.65

## 2021-11-01 MED ORDER — ONDANSETRON HCL 4 MG PO TABS: 4.0000 mg | ORAL_TABLET | Freq: Four times a day (QID) | ORAL | Status: DC | PRN

## 2021-11-01 MED ORDER — OYSTER SHELL CALCIUM/D3 500-5 MG-MCG PO TABS
1.0000 | ORAL_TABLET | Freq: Every day | ORAL | Status: DC
  Administered 2021-11-02 – 2021-11-06 (×5): 1 via ORAL
  Filled 2021-11-01 (×5): qty 1

## 2021-11-01 MED ORDER — HEPARIN BOLUS VIA INFUSION
4500.0000 [IU] | Freq: Once | INTRAVENOUS | Status: DC
  Filled 2021-11-01: qty 4500

## 2021-11-01 MED ORDER — DOCUSATE SODIUM 100 MG PO CAPS
100.0000 mg | ORAL_CAPSULE | Freq: Two times a day (BID) | ORAL | Status: DC
  Administered 2021-11-01 – 2021-11-04 (×7): 100 mg via ORAL
  Filled 2021-11-01 (×8): qty 1

## 2021-11-01 MED ORDER — TECHNETIUM TO 99M ALBUMIN AGGREGATED
4.0000 | Freq: Once | INTRAVENOUS | Status: AC | PRN
Start: 2021-11-01 — End: 2021-11-01
  Administered 2021-11-01: 4.13 via INTRAVENOUS

## 2021-11-01 MED ORDER — PREGABALIN 25 MG PO CAPS
25.0000 mg | ORAL_CAPSULE | Freq: Two times a day (BID) | ORAL | Status: DC
  Administered 2021-11-01 – 2021-11-06 (×10): 25 mg via ORAL
  Filled 2021-11-01 (×10): qty 1

## 2021-11-01 MED ORDER — LEVOTHYROXINE SODIUM 50 MCG PO TABS
50.0000 ug | ORAL_TABLET | Freq: Every day | ORAL | Status: DC
  Administered 2021-11-02 – 2021-11-06 (×5): 50 ug via ORAL
  Filled 2021-11-01 (×5): qty 1

## 2021-11-01 MED ORDER — POLYETHYLENE GLYCOL 3350 17 G PO PACK: 17.0000 g | PACK | Freq: Every day | ORAL | Status: DC | PRN

## 2021-11-01 MED ORDER — ONDANSETRON HCL 4 MG/2ML IJ SOLN: 4.0000 mg | Freq: Four times a day (QID) | INTRAMUSCULAR | Status: DC | PRN

## 2021-11-01 MED ORDER — PANTOPRAZOLE SODIUM 40 MG PO TBEC
40.0000 mg | DELAYED_RELEASE_TABLET | Freq: Every day | ORAL | Status: DC
  Administered 2021-11-02 – 2021-11-06 (×5): 40 mg via ORAL
  Filled 2021-11-01 (×5): qty 1

## 2021-11-01 MED ORDER — SODIUM CHLORIDE 0.9 % IV SOLN
1.0000 g | INTRAVENOUS | Status: AC
  Administered 2021-11-02 – 2021-11-05 (×4): 1 g via INTRAVENOUS
  Filled 2021-11-01: qty 10
  Filled 2021-11-01 (×3): qty 1

## 2021-11-01 MED ORDER — SODIUM CHLORIDE 0.9% FLUSH
3.0000 mL | Freq: Two times a day (BID) | INTRAVENOUS | Status: DC
  Administered 2021-11-01 – 2021-11-05 (×7): 3 mL via INTRAVENOUS

## 2021-11-01 NOTE — ED Notes (Signed)
Pt educated on need for UA, purewick placed, pt educated.

## 2021-11-01 NOTE — Assessment & Plan Note (Signed)
Differential of reactive versus infectious.  Most likely reactive and patient is also on chronic steroid. -Continue to monitor

## 2021-11-01 NOTE — ED Provider Notes (Signed)
[facility] Provider Note    Event Date/Time   First MD Initiated Contact with Patient 11/01/21 1029     (approximate)   History   Medication Reaction   HPI  Amy Hensley is a 88 y.o. female who comes from [facility].  Reportedly she was placed on coding for pain control after she had her back surgery.  She went to stand up and began twitching and then was noticed to have low oxygen saturations.  Patient reports that codeine makes her twitch.  However the patient is complaining about feeling groggy.  She is awake and talking well and making perfect sense her O2 sats are 82.  On 6 L there are 89.  She is not usually on oxygen.  She still complains of a lot of back pain.  It is fairly diffuse in her back.      Physical Exam   Triage Vital Signs: ED Triage Vitals  Enc Vitals Group     BP 11/01/21 1026 111/87     Pulse Rate 11/01/21 1026 (!) 112     Resp 11/01/21 1026 16     Temp 11/01/21 1026 98.3 F (36.8 C)     Temp Source 11/01/21 1026 Oral     SpO2 11/01/21 1025 (!) 89 %     Weight 11/01/21 1027 143 lb 4.8 oz (65 kg)     Height 11/01/21 1027 '5\' 3"'$  (1.6 m)     Head Circumference --      [facility] --      Pain Score 11/01/21 1026 3     Pain Loc --      Pain Edu? --      Excl. in [place]? --     Most recent vital signs: Vitals:   11/01/21 1025 11/01/21 1026  BP:  111/87  Pulse:  (!) 112  Resp:  16  Temp:  98.3 F (36.8 C)  SpO2: (!) 89% (!) 82%     General: Awake, alert no distress talking and making sense.  Whenever she moves however she cries out in pain because her back hurts. CV:  Good peripheral perfusion.  Heart regular rate and rhythm no audible murmurs Resp:  Normal effort.  Lungs are clear Abd:  No distention.  Abdomen soft and nontender Back: Diffusely very tender Extremities: Some edema nothing severe.   ED Results / Procedures / Treatments   Labs (all labs ordered are listed, but only abnormal results are  displayed) Labs Reviewed  CBC WITH DIFFERENTIAL/PLATELET - Abnormal; Notable for the following components:      Result Value   WBC 17.2 (*)    nRBC 0.4 (*)    Neutro Abs 14.8 (*)    Abs Immature Granulocytes 0.60 (*)    All other components within normal limits  BRAIN NATRIURETIC PEPTIDE - Abnormal; Notable for the following components:   B Natriuretic Peptide 228.0 (*)    All other components within normal limits  COMPREHENSIVE METABOLIC PANEL - Abnormal; Notable for the following components:   Glucose, Bld 102 (*)    BUN 32 (*)    Creatinine, Ser 1.81 (*)    Total Protein 6.2 (*)    Albumin 2.9 (*)    GFR, Estimated 27 (*)    All other components within normal limits  D-DIMER, QUANTITATIVE - Abnormal; Notable for the following components:   D-Dimer, Quant 2.58 (*)    All other components within normal limits  TROPONIN I (HIGH SENSITIVITY) -  Abnormal; Notable for the following components:   Troponin I (High Sensitivity) 45 (*)    All other components within normal limits  URINALYSIS, ROUTINE W REFLEX MICROSCOPIC  PROCALCITONIN     EKG  EKG read and interpreted by me shows sinus tachycardia rate of 111 normal axis no ST segment slight depression and T wave inversion V1 through V4 otherwise nonspecific changes there is right bundle branch block EKG is essentially unchanged since the fourth of this month.  RADIOLOGY Chest x-ray reviewed reviewed and interpreted by me appears to show a posterior infiltrate.  Possible pneumonia L-spine reviewed and interpreted by me shows kyphoplasty no other obvious to me at least fractures Thoracic spine reviewed and interpreted by me shows no obvious fractures to me.   PROCEDURES:  Critical Care performed: Medical care time half an hour.  This includes evaluating the patient and talking to EMS and then the hospitalist.  Additionally I reviewed some of her old records and x-rays and x-rays and studies from  today.  Procedures   MEDICATIONS ORDERED IN ED: Medications  azithromycin (ZITHROMAX) 500 mg in sodium chloride 0.9 % 250 mL IVPB (has no administration in time range)  cefTRIAXone (ROCEPHIN) 1 g in sodium chloride 0.9 % 100 mL IVPB (has no administration in time range)     IMPRESSION / MDM / ASSESSMENT AND PLAN / ED COURSE  I reviewed the triage vital signs and the nursing notes. Patient with elevated white count hypoxia apparent pneumonia on chest x-ray.  We will get her in the hospital for IV antibiotics for pneumonia.  Additionally she has a very high D-dimer.  Uncertain if this is from her kyphoplasty or from something else.  Her GFR is too low for [place] to do CT angio we will have to do a VQ scan.  Differential diagnosis includes, but is not limited to, pneumonia or PE are the 2 most likely diagnosis.  Additionally she seems to have had her usual twitching with codeine.  Patient's presentation is most consistent with acute presentation with potential threat to life or bodily function.  he patient is on the cardiac monitor to evaluate for evidence of arrhythmia and/or significant heart rate changes.  None have been seen      FINAL CLINICAL IMPRESSION(S) / ED DIAGNOSES   Final diagnoses:  Hypoxia  Elevated d-dimer  Community acquired pneumonia, unspecified laterality     Rx / DC Orders   ED Discharge Orders     None        Note:  This document was prepared using Dragon voice recognition software and may include unintentional dictation errors.   Nena Polio, MD 11/01/21 1137

## 2021-11-01 NOTE — ED Notes (Addendum)
See triage note.   Pt was found to be 82% on RA when this RN arrived to pt room, pt placed on 6L/min via [place] with a O2 sat of 90%, MD aware.   Pt denies SOB or CP.  Per MAR from facility pt took 30 mg of codeine.

## 2021-11-01 NOTE — Progress Notes (Signed)
Pt reports shaking all over. I can see some shaking in hands, started after receiving tylenol, colace, and lovenox. MD notified at this time.

## 2021-11-01 NOTE — Assessment & Plan Note (Signed)
-   Continue home Synthroid °

## 2021-11-01 NOTE — Progress Notes (Signed)
Admission profile udpated ?

## 2021-11-01 NOTE — Assessment & Plan Note (Signed)
-   Recently completed treatment in May 2023

## 2021-11-01 NOTE — Assessment & Plan Note (Addendum)
Blood pressure currently within goal. -Home dose of amlodipine can be restarted after PE. -Continue to monitor

## 2021-11-01 NOTE — H&P (Signed)
History and Physical    Patient: Amy Hensley KDX:[id number] DOB: 1933-01-07 DOA: 11/01/2021 DOS: the patient was seen and examined on 11/01/2021 PCP: Adin Hector, MD  Patient coming from: SNF patient is from [facility]  Chief Complaint:  Chief Complaint  Patient presents with   Medication Reaction   HPI: Amy Hensley is a 88 y.o. female with medical history significant of giant cell arteritis, chronic back pain, hypertension, hypothyroidism, CKD stage IIIb, recent kyphoplasty of L2 and patient was discharged to [facility] on 10/20/2021.  Per patient she was experiencing worsening of her back pain since last night and she received an extra dose of codeine this morning and developed some shakiness.  Per patient she has experienced some shakiness with codeine in the past.  When she was trying to get out of bed she could not stand and fell back on the bed.  Denies any injuries.  Noted to have hypoxia with saturation in low 80s.  She was placed on oxygen. And was sent to ED for further evaluation.  Patient denies any recent illnesses which includes upper respiratory symptoms, no chest pain, no fever or chills or sick contacts.  No nausea, vomiting or diarrhea.  Patient with history of intermittent constipation, uses stool softener and MiraLAX as needed.  Patient denies any recent change in her appetite or weight. Patient denies any urinary symptoms.  ED course.  On arrival she was noted to have sinus tachycardia and hypoxic up to 82%, improved to 90% with 6 L of oxygen.  Labs pertinent for neutrophilic predominant leukocytosis at 17.2, BUN of 32, creatinine 1.81 with baseline of 1.4.  Troponin 45>>48, BNP 228, D Dimer 2.58. CXR with mild bibasilar opacities concerning for edema versus possible infiltrates. DG of thoracic spine was negative for any acute abnormality. DG lumbar spine with mild compression deformity of L1 concerning for acute fracture.  EKG.  Personally  reviewed.  Showed sinus tachycardia and right bundle branch block with nonspecific T wave changes.  EKG done on 6/4 was also shows right bundle branch block.  No significant acute changes.  QTc of 438.  VQ scan and lower extremity venous Doppler were ordered for concern of PE. TRH was consulted for admission for further management.   Review of Systems: As mentioned in the history of present illness. All other systems reviewed and are negative. Past Medical History:  Diagnosis Date   Arthritis    Chronic kidney disease    stage 3   Hypertension    Hypothyroidism    Past Surgical History:  Procedure Laterality Date   ABDOMINAL HYSTERECTOMY     ABDOMINAL SURGERY     tacked up uterus   APPENDECTOMY     ARTERY BIOPSY N/A 05/19/2021   Procedure: BIOPSY TEMPORAL ARTERY;  Surgeon: Herbert Pun, MD;  Location: [facility] ORS;  Service: General;  Laterality: N/A;   CATARACT EXTRACTION W/PHACO Left 10/04/2016   Procedure: CATARACT EXTRACTION PHACO AND INTRAOCULAR LENS PLACEMENT ([place]);  Surgeon: Eulogio Bear, MD;  Location: [facility] ORS;  Service: Ophthalmology;  Laterality: Left;  us02:17.8 ap%19.6 cde27.31 fluid lot # [id number]   CESAREAN SECTION     EYE SURGERY     INGUINAL HERNIA REPAIR Right 12/08/2018   Procedure: OPEN RIGHT HERNIA REPAIR INGUINAL ADULT;  Surgeon: Herbert Pun, MD;  Location: [facility] ORS;  Service: General;  Laterality: Right;   IR INJECT/THERA/INC NEEDLE/CATH/PLC EPI/LUMB/SAC W/IMG  09/26/2021   IR KYPHO LUMBAR INC FX REDUCE BONE BX UNI/BIL  CANNULATION INC/IMAGING  10/18/2021   Social History:  reports that she has quit smoking. Her smoking use included cigarettes. She smoked an average of 1 pack per day. She has never used smokeless tobacco. She reports that she does not drink alcohol and does not use drugs.  Allergies  Allergen Reactions   Alendronate Other (See Comments)    Dyspepsia (indigestion)    Buspirone Other (See Comments)    sedation   Codeine      twitching    No family history on file.  Prior to Admission medications   Medication Sig Start Date End Date Taking? Authorizing Provider  acetaminophen (TYLENOL) 325 MG tablet Take 650 mg by mouth every 6 (six) hours as needed.   Yes [provider]  amLODipine (NORVASC) 10 MG tablet Take 10 mg by mouth daily.    [provider]  Calcium Carb-Cholecalciferol 600-800 MG-UNIT TABS Take 1 tablet by mouth daily.    [provider]  Cholecalciferol (VITAMIN D3 PO) Take 1 tablet by mouth daily.    [provider]  esomeprazole (NEXIUM) 20 MG capsule Take 20 mg by mouth every morning. 05/18/21 05/18/22  [provider]  levothyroxine (SYNTHROID) 50 MCG tablet Take 50 mcg by mouth daily before breakfast.  07/09/16   [provider]  magnesium oxide (MAG-OX) 400 MG tablet Take 400 mg by mouth daily.    [provider]  polyethylene glycol (MIRALAX / GLYCOLAX) 17 g packet Take 17 g by mouth 2 (two) times daily. 10/20/21   Nolberto Hanlon, MD  predniSONE (DELTASONE) 50 MG tablet Take 0.5 tablets (25 mg total) by mouth daily with breakfast. 09/28/21   Antonieta Pert, MD  pregabalin (LYRICA) 25 MG capsule Take 25 mg by mouth 2 (two) times daily.    [provider]  senna-docusate (SENOKOT-S) 8.6-50 MG tablet Take 1 tablet by mouth at bedtime as needed for mild constipation. 10/20/21   Nolberto Hanlon, MD    Physical Exam: Vitals:   11/01/21 1027 11/01/21 1030 11/01/21 1200 11/01/21 1300  BP:  137/72 111/75 102/69  Pulse:  (!) 110 (!) 109 (!) 102  Resp:  '19 18 14  '$ Temp:      TempSrc:      SpO2:  90% 91% 92%  Weight: 65 kg     Height: '5\' 3"'$  (1.6 m)       General: Vital signs reviewed.  Patient is well-developed and well-nourished, in no acute distress and cooperative with exam.  Head: Normocephalic and atraumatic. Eyes: EOMI, conjunctivae normal, no scleral icterus.  Neck: Supple, trachea midline, normal ROM,  Cardiovascular: Sinus  tachycardia, S1 normal, S2 normal, no murmurs, Pulmonary/Chest: Clear to auscultation bilaterally, no wheezes, rales, or rhonchi. Abdominal: Soft, non-tender, non-distended, BS +,  Extremities: No lower extremity edema bilaterally,  pulses symmetric and intact bilaterally. No cyanosis or clubbing. Neurological: A&O x3, Strength is normal and symmetric bilaterally, cranial nerve II-XII are grossly intact, no focal motor deficit, sensory intact to light touch bilaterally.  Skin: Warm, dry and intact.  Bilateral multiple ecchymosis on extremities Psychiatric: Normal mood and affect.   Data Reviewed: Reviewed as mentioned above.  Assessment and Plan: * Acute respiratory failure with hypoxia [facility]) Patient presented with acute onset respiratory failure.  No other upper respiratory symptoms.  Chest x-ray with mild bibasilar opacities which can be due to edema or a possible infiltrate.  Procalcitonin elevated at 1.09.  No fever, do have neutrophilic predominant leukocytosis which can be reactive versus secondary  to infection. D-dimer elevated at 2.58. Patient with significantly decreased mobility, not on any antiplatelet or anticoagulation-high risk for PE.  Unable to obtain CTA due to AKI. Currently on 6 L of oxygen with saturation around 90%, no baseline oxygen use. She was started on Zithromax and ceftriaxone for concern of pneumonia in the ED -Admit to telemetry  -Lower extremity venous Doppler -VQ scan -Start her on heparin infusion-can be discontinued if negative for any DVT or PE. -We will continue with Zithromax and ceftriaxone due to elevated procalcitonin at this time.    Acute renal failure superimposed on stage 3b chronic kidney disease (HCC) Creatinine at 1.81 with baseline around 1.4. Clinically appears dry. -Give her some gentle IV fluid -Monitor renal function  Elevated troponin Mildly elevated troponin at 45, no chest pain, most likely secondary to demand ischemia with  hypoxia. BNP at 228 -Continue to trend -Echocardiogram  Leukocytosis Differential of reactive versus infectious.  Most likely reactive and patient is also on chronic steroid. -Continue to monitor  Giant cell arteritis (HCC) - Continue home dose of steroid  Hypertension Blood pressure currently within goal. -Home dose of amlodipine can be restarted after PE. -Continue to monitor   Hypothyroidism - Continue home Synthroid  Lumbar radiculopathy Patient with recent kyphoplasty of L2. Imaging done today with concern of a new compression fracture of L1.  Per patient little worsening of her chronic back pain for which she received an extra dose of codeine which causes some shakiness. -Pain management with tramadol and Tylenol. -As needed Flexeril -LCSO Brace  Hepatitis C antibody positive in blood - Recently completed treatment in May 2023  Advance Care Planning:   Code Status: Full Code   Consults: None  Family Communication: Discussed with son and husband at bedside  Severity of Illness: The appropriate patient status for this patient is OBSERVATION. Observation status is judged to be reasonable and necessary in order to provide the required intensity of service to ensure the patient's safety. The patient's presenting symptoms, physical exam findings, and initial radiographic and laboratory data in the context of their medical condition is felt to place them at decreased risk for further clinical deterioration. Furthermore, it is anticipated that the patient will be medically stable for discharge from the hospital within 2 midnights of admission.   This record has been created using Systems analyst. Errors have been sought and corrected,but may not always be located. Such creation errors do not reflect on the standard of care.   Author: Lorella Nimrod, MD 11/01/2021 1:58 PM  For on call review [email or web address].

## 2021-11-01 NOTE — ED Notes (Signed)
Pt at [facility] for Lung scan

## 2021-11-01 NOTE — Assessment & Plan Note (Signed)
Mildly elevated troponin at 45, no chest pain, most likely secondary to demand ischemia with hypoxia. BNP at 228 -Continue to trend -Echocardiogram

## 2021-11-01 NOTE — ED Triage Notes (Signed)
Pt to ED via [facility] from [facility]. Per EMS pt recently had back surgery. Staff at [facility] went to get patient out of bed this morning and reported that pt started twitching all over and her SpO2 dropped into the high 80's. Pt informed EMS upon arrival to ED that when she takes Codeine it makes her twitch. Pt denies feeling short of breath but states that they keep telling her to take a deep breath. Pt is currently A & O x 4 and is in NAD.  Pts vital for EMS as follows BP: 113/84 HR: 112 SpO2: 90% on RA

## 2021-11-01 NOTE — Assessment & Plan Note (Signed)
Patient presented with acute onset respiratory failure.  No other upper respiratory symptoms.  Chest x-ray with mild bibasilar opacities which can be due to edema or a possible infiltrate.  Procalcitonin elevated at 1.09.  No fever, do have neutrophilic predominant leukocytosis which can be reactive versus secondary to infection. D-dimer elevated at 2.58. Patient with significantly decreased mobility, not on any antiplatelet or anticoagulation-high risk for PE.  Unable to obtain CTA due to AKI. Currently on 6 L of oxygen with saturation around 90%, no baseline oxygen use. She was started on Zithromax and ceftriaxone for concern of pneumonia in the ED -Admit to telemetry  -Lower extremity venous Doppler -VQ scan -Start her on heparin infusion-can be discontinued if negative for any DVT or PE. -We will continue with Zithromax and ceftriaxone due to elevated procalcitonin at this time.

## 2021-11-01 NOTE — Assessment & Plan Note (Signed)
-   Continue home dose of steroid

## 2021-11-01 NOTE — Assessment & Plan Note (Signed)
Creatinine at 1.81 with baseline around 1.4. Clinically appears dry. -Give her some gentle IV fluid -Monitor renal function

## 2021-11-01 NOTE — Assessment & Plan Note (Addendum)
Patient with recent kyphoplasty of L2. Imaging done today with concern of a new compression fracture of L1.  Per patient little worsening of her chronic back pain for which she received an extra dose of codeine which causes some shakiness. -Pain management with tramadol and Tylenol. -As needed Flexeril -LCSO Brace

## 2021-11-02 DIAGNOSIS — M5416 Radiculopathy, lumbar region: Secondary | ICD-10-CM | POA: Diagnosis present

## 2021-11-02 DIAGNOSIS — J189 Pneumonia, unspecified organism: Secondary | ICD-10-CM | POA: Diagnosis present

## 2021-11-02 DIAGNOSIS — E039 Hypothyroidism, unspecified: Secondary | ICD-10-CM | POA: Diagnosis present

## 2021-11-02 DIAGNOSIS — M316 Other giant cell arteritis: Secondary | ICD-10-CM | POA: Diagnosis present

## 2021-11-02 DIAGNOSIS — Z79890 Hormone replacement therapy (postmenopausal): Secondary | ICD-10-CM | POA: Diagnosis not present

## 2021-11-02 DIAGNOSIS — I4510 Unspecified right bundle-branch block: Secondary | ICD-10-CM | POA: Diagnosis present

## 2021-11-02 DIAGNOSIS — K5900 Constipation, unspecified: Secondary | ICD-10-CM | POA: Diagnosis present

## 2021-11-02 DIAGNOSIS — N179 Acute kidney failure, unspecified: Secondary | ICD-10-CM | POA: Diagnosis present

## 2021-11-02 DIAGNOSIS — J9601 Acute respiratory failure with hypoxia: Secondary | ICD-10-CM | POA: Diagnosis present

## 2021-11-02 DIAGNOSIS — Z7952 Long term (current) use of systemic steroids: Secondary | ICD-10-CM | POA: Diagnosis not present

## 2021-11-02 DIAGNOSIS — I129 Hypertensive chronic kidney disease with stage 1 through stage 4 chronic kidney disease, or unspecified chronic kidney disease: Secondary | ICD-10-CM | POA: Diagnosis present

## 2021-11-02 DIAGNOSIS — M4856XA Collapsed vertebra, not elsewhere classified, lumbar region, initial encounter for fracture: Secondary | ICD-10-CM | POA: Diagnosis present

## 2021-11-02 DIAGNOSIS — Z87891 Personal history of nicotine dependence: Secondary | ICD-10-CM | POA: Diagnosis not present

## 2021-11-02 DIAGNOSIS — I248 Other forms of acute ischemic heart disease: Secondary | ICD-10-CM | POA: Diagnosis present

## 2021-11-02 DIAGNOSIS — B1920 Unspecified viral hepatitis C without hepatic coma: Secondary | ICD-10-CM | POA: Diagnosis present

## 2021-11-02 DIAGNOSIS — Z90710 Acquired absence of both cervix and uterus: Secondary | ICD-10-CM | POA: Diagnosis not present

## 2021-11-02 DIAGNOSIS — G8929 Other chronic pain: Secondary | ICD-10-CM | POA: Diagnosis present

## 2021-11-02 DIAGNOSIS — N1832 Chronic kidney disease, stage 3b: Secondary | ICD-10-CM | POA: Diagnosis present

## 2021-11-02 DIAGNOSIS — R0902 Hypoxemia: Secondary | ICD-10-CM | POA: Diagnosis present

## 2021-11-02 DIAGNOSIS — Z79899 Other long term (current) drug therapy: Secondary | ICD-10-CM | POA: Diagnosis not present

## 2021-11-02 LAB — BASIC METABOLIC PANEL
Anion gap: 10 (ref 5–15)
BUN: 35 mg/dL — ABNORMAL HIGH (ref 8–23)
CO2: 23 mmol/L (ref 22–32)
Calcium: 8.9 mg/dL (ref 8.9–10.3)
Chloride: 103 mmol/L (ref 98–111)
Creatinine, Ser: 1.47 mg/dL — ABNORMAL HIGH (ref 0.44–1.00)
GFR, Estimated: 34 mL/min — ABNORMAL LOW (ref >60)
Glucose, Bld: 75 mg/dL (ref 70–99)
Potassium: 4.2 mmol/L (ref 3.5–5.1)
Sodium: 136 mmol/L (ref 135–145)

## 2021-11-02 LAB — ECHOCARDIOGRAM COMPLETE
Area-P 1/2: 5.38 cm2
Height: 63 in
S' Lateral: 1.7 cm
Weight: 2292.78 oz

## 2021-11-02 LAB — CBC
HCT: 34.7 % — ABNORMAL LOW (ref 36.0–46.0)
Hemoglobin: 11.1 g/dL — ABNORMAL LOW (ref 12.0–15.0)
MCH: 31.2 pg (ref 26.0–34.0)
MCHC: 32 g/dL (ref 30.0–36.0)
MCV: 97.5 fL (ref 80.0–100.0)
Platelets: 321 10*3/uL (ref 150–400)
RBC: 3.56 MIL/uL — ABNORMAL LOW (ref 3.87–5.11)
RDW: 14.2 % (ref 11.5–15.5)
WBC: 15 10*3/uL — ABNORMAL HIGH (ref 4.0–10.5)
nRBC: 0.1 % (ref 0.0–0.2)

## 2021-11-02 MED ORDER — PREDNISONE 5 MG PO TABS
17.5000 mg | ORAL_TABLET | Freq: Every day | ORAL | Status: DC
  Administered 2021-11-03 – 2021-11-06 (×4): 17.5 mg via ORAL
  Filled 2021-11-02 (×4): qty 1

## 2021-11-02 MED ORDER — ENOXAPARIN SODIUM 30 MG/0.3ML IJ SOSY
30.0000 mg | PREFILLED_SYRINGE | INTRAMUSCULAR | Status: DC
  Administered 2021-11-03 – 2021-11-06 (×4): 30 mg via SUBCUTANEOUS
  Filled 2021-11-02 (×4): qty 0.3

## 2021-11-02 MED ORDER — LIDOCAINE 5 % EX PTCH
1.0000 | MEDICATED_PATCH | CUTANEOUS | Status: DC
  Administered 2021-11-02 – 2021-11-06 (×5): 1 via TRANSDERMAL
  Filled 2021-11-02 (×5): qty 1

## 2021-11-02 MED ORDER — OXYCODONE HCL 5 MG PO TABS
5.0000 mg | ORAL_TABLET | ORAL | Status: DC | PRN
  Administered 2021-11-02 – 2021-11-06 (×9): 5 mg via ORAL
  Filled 2021-11-02 (×9): qty 1

## 2021-11-02 MED ORDER — AZITHROMYCIN 500 MG PO TABS
500.0000 mg | ORAL_TABLET | Freq: Every day | ORAL | Status: AC
  Administered 2021-11-03 – 2021-11-05 (×3): 500 mg via ORAL
  Filled 2021-11-02 (×3): qty 1

## 2021-11-02 MED ORDER — MORPHINE SULFATE (PF) 2 MG/ML IV SOLN
1.0000 mg | INTRAVENOUS | Status: DC | PRN
  Administered 2021-11-02 – 2021-11-03 (×3): 1 mg via INTRAVENOUS
  Filled 2021-11-02 (×3): qty 1

## 2021-11-02 NOTE — Progress Notes (Signed)
Orthopedic Tech Progress Note Patient Details:  Amy Hensley 02-17-1933 [id number] Called LSO brace into [facility] Patient ID: [name], female   DOB: [birth year], 88 y.o.   MRN: [id number]  Chip Boer 11/02/2021, 9:33 AM

## 2021-11-02 NOTE — Progress Notes (Signed)
PROGRESS NOTE    Amy Hensley  DGL:[id number] DOB: 1933-03-19 DOA: 11/01/2021 PCP: Adin Hector, MD    Brief Narrative:  Amy Hensley is a 88 y.o. female with medical history significant of giant cell arteritis, chronic back pain, hypertension, hypothyroidism, CKD stage IIIb, recent kyphoplasty of L2 and patient was discharged to [facility] on 10/20/2021.   Per patient she was experiencing worsening of her back pain since last night and she received an extra dose of codeine this morning and developed some shakiness.  Per patient she has experienced some shakiness with codeine in the past.  When she was trying to get out of bed she could not stand and fell back on the bed.  Denies any injuries.  Noted to have hypoxia with saturation in low 80s.  She was placed on oxygen. And was sent to ED for further evaluation.  ED course.  On arrival she was noted to have sinus tachycardia and hypoxic up to 82%, improved to 90% with 6 L of oxygen.  Labs pertinent for neutrophilic predominant leukocytosis at 17.2, BUN of 32, creatinine 1.81 with baseline of 1.4.  Troponin 45>>48, BNP 228, D Dimer 2.58. CXR with mild bibasilar opacities concerning for edema versus possible infiltrates. DG of thoracic spine was negative for any acute abnormality. DG lumbar spine with mild compression deformity of L1 concerning for acute fracture  6/22 vq scan negative Venous doppler negative.        Consultants:    Procedures:   Antimicrobials:      Subjective: No sob, or cp. Has back pain, needs more than oxycodone that I gave her earlier  Objective: Vitals:   11/01/21 1540 11/01/21 2037 11/02/21 0521 11/02/21 0751  BP: 121/62 128/71 118/68 133/64  Pulse: 95 84 89 88  Resp: '19 16 20 16  '$ Temp: 98.2 F (36.8 C) 97.9 F (36.6 C) 98.3 F (36.8 C) 97.9 F (36.6 C)  TempSrc:      SpO2: 96% 93% 95% 95%  Weight:   64 kg   Height:        Intake/Output Summary (Last 24 hours) at 11/02/2021  0842 Last data filed at 11/02/2021 0830 Gross per 24 hour  Intake 475.43 ml  Output 900 ml  Net -424.57 ml   Filed Weights   11/01/21 1027 11/02/21 0521  Weight: 65 kg 64 kg    Examination: Calm, NAD Cta no w/r Reg s1/s2 no gallop Soft benign +bs No edema Aaoxox3  Mood and affect appropriate in current setting     Data Reviewed: I have personally reviewed following labs and imaging studies  CBC: Recent Labs  Lab 11/01/21 1031 11/02/21 0332  WBC 17.2* 15.0*  NEUTROABS 14.8*  --   HGB 13.0 11.1*  HCT 40.7 34.7*  MCV 96.9 97.5  PLT 356 518   Basic Metabolic Panel: Recent Labs  Lab 11/01/21 1031 11/02/21 0332  NA 136 136  K 4.3 4.2  CL 101 103  CO2 25 23  GLUCOSE 102* 75  BUN 32* 35*  CREATININE 1.81* 1.47*  CALCIUM 9.4 8.9   GFR: Estimated Creatinine Clearance: 24.3 mL/min (A) (by C-G formula based on SCr of 1.47 mg/dL (H)). Liver Function Tests: Recent Labs  Lab 11/01/21 1031  AST 19  ALT 15  ALKPHOS 90  BILITOT 0.4  PROT 6.2*  ALBUMIN 2.9*   No results for input(s): "LIPASE", "AMYLASE" in the last 168 hours. No results for input(s): "AMMONIA" in the last 168 hours.  Coagulation Profile: Recent Labs  Lab 11/01/21 1555  INR 1.1   Cardiac Enzymes: No results for input(s): "CKTOTAL", "CKMB", "CKMBINDEX", "TROPONINI" in the last 168 hours. BNP (last 3 results) No results for input(s): "PROBNP" in the last 8760 hours. HbA1C: No results for input(s): "HGBA1C" in the last 72 hours. CBG: No results for input(s): "GLUCAP" in the last 168 hours. Lipid Profile: No results for input(s): "CHOL", "HDL", "LDLCALC", "TRIG", "CHOLHDL", "LDLDIRECT" in the last 72 hours. Thyroid Function Tests: No results for input(s): "TSH", "T4TOTAL", "FREET4", "T3FREE", "THYROIDAB" in the last 72 hours. Anemia Panel: No results for input(s): "VITAMINB12", "FOLATE", "FERRITIN", "TIBC", "IRON", "RETICCTPCT" in the last 72 hours. Sepsis Labs: Recent Labs  Lab  11/01/21 1211  PROCALCITON 1.05    No results found for this or any previous visit (from the past 240 hour(s)).       Radiology Studies: US Venous Img Lower Bilateral (DVT)  Result Date: 11/01/2021 CLINICAL DATA:  Hypoxia EXAM: BILATERAL LOWER EXTREMITY VENOUS DOPPLER ULTRASOUND TECHNIQUE: Gray-scale sonography with graded compression, as well as color Doppler and duplex ultrasound were performed to evaluate the lower extremity deep venous systems from the level of the common femoral vein and including the common femoral, femoral, profunda femoral, popliteal and calf veins including the posterior tibial, peroneal and gastrocnemius veins when visible. The superficial great saphenous vein was also interrogated. Spectral Doppler was utilized to evaluate flow at rest and with distal augmentation maneuvers in the common femoral, femoral and popliteal veins. COMPARISON:  None Available. FINDINGS: RIGHT LOWER EXTREMITY Common Femoral Vein: No evidence of thrombus. Normal compressibility, respiratory phasicity and response to augmentation. Saphenofemoral Junction: No evidence of thrombus. Normal compressibility and flow on color Doppler imaging. Profunda Femoral Vein: No evidence of thrombus. Normal compressibility and flow on color Doppler imaging. Femoral Vein: No evidence of thrombus. Normal compressibility, respiratory phasicity and response to augmentation. Popliteal Vein: No evidence of thrombus. Normal compressibility, respiratory phasicity and response to augmentation. Calf Veins: No evidence of thrombus. Normal compressibility and flow on color Doppler imaging. Superficial Great Saphenous Vein: No evidence of thrombus. Normal compressibility. Venous Reflux:  None. Other Findings:  None. LEFT LOWER EXTREMITY Common Femoral Vein: No evidence of thrombus. Normal compressibility, respiratory phasicity and response to augmentation. Saphenofemoral Junction: No evidence of thrombus. Normal compressibility  and flow on color Doppler imaging. Profunda Femoral Vein: No evidence of thrombus. Normal compressibility and flow on color Doppler imaging. Femoral Vein: No evidence of thrombus. Normal compressibility, respiratory phasicity and response to augmentation. Popliteal Vein: No evidence of thrombus. Normal compressibility, respiratory phasicity and response to augmentation. Calf Veins: No evidence of thrombus. Normal compressibility and flow on color Doppler imaging. Superficial Great Saphenous Vein: No evidence of thrombus. Normal compressibility. Venous Reflux:  None. Other Findings:  Left popliteal cyst measures 2.6 x 0.7 x 1.6 cm. IMPRESSION: No evidence of deep venous thrombosis in either lower extremity. Electronically Signed   By: Franchot Gallo M.D.   On: 11/01/2021 15:01   NM Pulmonary Perfusion  Result Date: 11/01/2021 CLINICAL DATA:  Positive D-dimer, back pain, recent surgery EXAM: NUCLEAR MEDICINE PERFUSION LUNG SCAN TECHNIQUE: Perfusion images were obtained in multiple projections after intravenous injection of radiopharmaceutical. Ventilation scans intentionally deferred if perfusion scan and chest x-ray adequate for interpretation during COVID 19 epidemic. RADIOPHARMACEUTICALS:  4.13 mCi Tc-81mMAA IV COMPARISON:  Chest radiographs done earlier today FINDINGS: There are no discrete wedge-shaped focal perfusion defects. There are linear nonsegmental areas of decreased activity in the left mid  and left lower lung fields. IMPRESSION: Low probability for pulmonary embolism. Electronically Signed   By: Elmer Picker M.D.   On: 11/01/2021 14:24   DG Thoracic Spine 2 View  Result Date: 11/01/2021 CLINICAL DATA:  Back pain. EXAM: THORACIC SPINE 2 VIEWS COMPARISON:  None Available. FINDINGS: There is no evidence of thoracic spine fracture. Alignment is normal. No other significant bone abnormalities are identified. IMPRESSION: Negative. Electronically Signed   By: Marijo Conception M.D.   On:  11/01/2021 11:35   DG Lumbar Spine Complete  Result Date: 11/01/2021 CLINICAL DATA:  Lower back pain. EXAM: LUMBAR SPINE - COMPLETE 4+ VIEW COMPARISON:  October 15, 2021. FINDINGS: Status post L2 kyphoplasty. There is now noted mild compression deformity L1 vertebral body concerning for acute fracture. Moderate degenerative disc disease is noted at L2-3 and L3-4. IMPRESSION: Status post L2 kyphoplasty. Mild compression deformity of L1 vertebral body is now noted concerning for acute fracture. Electronically Signed   By: Marijo Conception M.D.   On: 11/01/2021 11:34   DG Chest 2 View  Result Date: 11/01/2021 CLINICAL DATA:  Shortness of breath. EXAM: CHEST - 2 VIEW COMPARISON:  None Available. FINDINGS: The heart size and mediastinal contours are within normal limits. Mild bibasilar opacities are noted concerning for possible edema or infiltrates. No pneumothorax or pleural effusion is noted. The visualized skeletal structures are unremarkable. IMPRESSION: Mild bibasilar opacities are noted concerning for edema or possibly infiltrates. Aortic Atherosclerosis (ICD10-I70.0). Electronically Signed   By: Marijo Conception M.D.   On: 11/01/2021 11:31        Scheduled Meds:  azithromycin  250 mg Oral Daily   calcium-vitamin D  1 tablet Oral Daily   docusate sodium  100 mg Oral BID   enoxaparin (LOVENOX) injection  1 mg/kg Subcutaneous Q12H   levothyroxine  50 mcg Oral QAC breakfast   magnesium oxide  400 mg Oral Daily   pantoprazole  40 mg Oral Daily   predniSONE  25 mg Oral Q breakfast   pregabalin  25 mg Oral BID   sodium chloride flush  3 mL Intravenous Q12H   Continuous Infusions:  cefTRIAXone (ROCEPHIN)  IV     lactated ringers 50 mL/hr at 11/01/21 1557    Assessment & Plan:   Principal Problem:   Acute respiratory failure with hypoxia (HCC) Active Problems:   Acute renal failure superimposed on stage 3b chronic kidney disease (HCC)   Leukocytosis   Elevated troponin   Giant cell  arteritis (HCC)   Hypertension   Hypothyroidism   Lumbar radiculopathy   Hepatitis C antibody positive in blood   Acute respiratory failure with hypoxia [facility]) Patient presented with acute onset respiratory failure.  No other upper respiratory symptoms.  Chest x-ray with mild bibasilar opacities which can be due to edema or a possible infiltrate.  Procalcitonin elevated at 1.09.  No fever, do have neutrophilic predominant leukocytosis which can be reactive versus secondary to infection. D-dimer elevated at 2.58. 6/22 procalcitonin 1.05 Started on IV antibiotics, WBC improving Likely due to pneumonia VQ low probability for PE Venous ultrasound negative for DVT Echo normal EF with grade 1 diastolic Wean down off oxygen as tolerated keep O2 at 92% above DC ivf. DC lovenox for PE treatment since above w/u negative      Acute renal failure superimposed on stage 3b chronic kidney disease ([place]) Improved and at baseline with ivf Dc ivf   Elevated troponin Likely demand ischemia due to respiratory status  as above Echo normal EF, no regional wall motion abnormality   Leukocytosis Chronically elevated but has improved with IV antibiotics initiation Also on chronic steroids   Giant cell arteritis (HCC) Discussed with Dr. Posey Pronto patient's rheumatologist patient is supposed to start prednisone 17.5 mg daily we will start this dose tomorrow as she is already received today's dose of prednisone 25 mg   Hypertension Stable Continue current regimen   Hypothyroidism Continue Synthroid   Lumbar radiculopathy Patient with recent kyphoplasty of L2. Imaging done today with concern of a new compression fracture of L1.  Per patient little worsening of her chronic back pain for which she received an extra dose of codeine which causes some shakiness. 6/22 LSO brace with sitting and ambulation Spoke to Dr. Cari Caraway via chat needs to follow-up with him in 2 weeks Pain management Flexeril Add  lidocaine patch PT after LSO brace comes in    Hepatitis C antibody positive in blood -Recently completed treatment in May 2023    DVT prophylaxis: Lovenox Code Status: Full Family Communication: Updated husband Disposition Plan:  Status is: Inpatient Patient remains inpatient due to IV treatment, needs PT evaluation, may likely need SNF        LOS: 0 days   Time spent: 20mn    SNolberto Hanlon MD [facility] Pager [phone]  If 7PM-7AM, please contact night-coverage 11/02/2021, 8:42 AM

## 2021-11-03 DIAGNOSIS — J9601 Acute respiratory failure with hypoxia: Secondary | ICD-10-CM | POA: Diagnosis not present

## 2021-11-03 MED ORDER — LORATADINE 10 MG PO TABS
10.0000 mg | ORAL_TABLET | Freq: Every day | ORAL | Status: DC
  Administered 2021-11-03 – 2021-11-06 (×4): 10 mg via ORAL
  Filled 2021-11-03 (×4): qty 1

## 2021-11-03 MED ORDER — SODIUM CHLORIDE 0.9% FLUSH
3.0000 mL | Freq: Two times a day (BID) | INTRAVENOUS | Status: DC
  Administered 2021-11-03 – 2021-11-06 (×7): 3 mL via INTRAVENOUS

## 2021-11-03 MED ORDER — FUROSEMIDE 20 MG PO TABS
10.0000 mg | ORAL_TABLET | Freq: Once | ORAL | Status: AC
Start: 2021-11-03 — End: 2021-11-03
  Administered 2021-11-03: 10 mg via ORAL
  Filled 2021-11-03: qty 1

## 2021-11-03 MED ORDER — GUAIFENESIN ER 600 MG PO TB12
600.0000 mg | ORAL_TABLET | Freq: Two times a day (BID) | ORAL | Status: DC
  Administered 2021-11-03 – 2021-11-06 (×7): 600 mg via ORAL
  Filled 2021-11-03 (×7): qty 1

## 2021-11-03 NOTE — Evaluation (Addendum)
Physical Therapy Evaluation Patient Details Name: Amy Hensley MRN: [id number] DOB: 1932-12-02 Today's Date: 11/03/2021  History of Present Illness  Pt is an 88 y/o F admitted on 11/01/21. Of note, pt with recent kyphoplasty of L2 & pt d/c to [facility] on 10/20/21. Pt had been experiencing worsening back pain & developed shakiness after receiving extra dose of pain medication. When pt tried to get OOB she could not satnd & fell back on her bed & noted to have hypoxia, so sent to ED for further evaluation. Pt is being treated for acute respiratory failure with hypoxia. Imaging was concerning for nwe compression fx of L1 & LSO ordered for sitting & ambulation. PMH: giant cell arteritis, chronic back pain, HTN, hypothyroidism, CKD stage 3B, recent kyphoplasty of L2, Hep C  Clinical Impression  Pt seen for PT evaluation with husband present. Pt on 6L/min via nasal cannula throughout session. PT educated pt on back precautions & need to wear LSO but pt unable to recall precautions immediately after education. Pt requires mod assist for bed mobility & mod assist to attempt STS x 3 from EOB. Pt with BLE weakness & unable achieve full upright standing, so for safety, performed lateral scoot bed>drop arm recliner with max assist. Pt endorses pain with anterior weight shifting & extra time to initiate tasks. Pt would benefit from STR upon d/c to maximize independence with functional mobility, decrease caregiver burden, & reduce fall risk prior to return home.   Recommendations for follow up therapy are one component of a multi-disciplinary discharge planning process, led by the attending physician.  Recommendations may be updated based on patient status, additional functional criteria and insurance authorization.  Follow Up Recommendations Skilled nursing-short term rehab (<3 hours/day) Can patient physically be transported by private vehicle: No    Assistance Recommended at Discharge Frequent or constant  Supervision/Assistance  Patient can return home with the following  Help with stairs or ramp for entrance;Assist for transportation;Two people to help with walking and/or transfers;Two people to help with bathing/dressing/bathroom;Assistance with cooking/housework    Equipment Recommendations None recommended by PT  Recommendations for Other Services    OT consult   Functional Status Assessment Patient has had a recent decline in their functional status and demonstrates the ability to make significant improvements in function in a reasonable and predictable amount of time.     Precautions / Restrictions Precautions Precautions: Fall;Back Required Braces or Orthoses: Spinal Brace Spinal Brace: Lumbar corset (when sitting/OOB) Restrictions Weight Bearing Restrictions: No      Mobility  Bed Mobility Overal bed mobility: Needs Assistance Bed Mobility: Rolling, Sidelying to Sit Rolling: Mod assist Sidelying to sit: Mod assist       General bed mobility comments: MAX verbal/tactile cuing for log rolling technique, assistance to upright trunk to sitting EOB    Transfers Overall transfer level: Needs assistance Equipment used: Rolling walker (2 wheels) Transfers: Sit to/from Stand, Bed to chair/wheelchair/BSC Sit to Stand: Mod assist          Lateral/Scoot Transfers: Max assist (to R, equal height surfaces, max cuing/facilitation for head/hips relationship & pt somewhat able to assist with scooting) General transfer comment: STS x 3 attempts from EOB with ongoing cuing for safe hand placement, extra time to transition UE to RW, assistance to power up, unable to fully extend hips/knees & shift pelvis anteriorly to come to full upright standing posture despite multimodal cuing from PT    Ambulation/Gait  Stairs            Wheelchair Mobility    Modified Rankin (Stroke Patients Only)       Balance Overall balance assessment: Needs  assistance Sitting-balance support: Feet supported, Bilateral upper extremity supported Sitting balance-Leahy Scale: Poor   Postural control: Posterior lean Standing balance support: Bilateral upper extremity supported, During functional activity, Reliant on assistive device for balance Standing balance-Leahy Scale: Zero                               Pertinent Vitals/Pain Pain Assessment Pain Assessment: Faces Faces Pain Scale: Hurts little more Pain Location: low back Pain Descriptors / Indicators: Discomfort, Grimacing Pain Intervention(s): Monitored during session, Repositioned    Home Living Family/patient expects to be discharged to:: Skilled nursing facility Living Arrangements: Spouse/significant other Available Help at Discharge: Family;Available PRN/intermittently Type of Home: House Home Access: Stairs to enter Entrance Stairs-Rails: Left Entrance Stairs-Number of Steps: 3   Home Layout: One level Home Equipment: Educational psychologist (4 wheels);Cane - single point;Grab bars - tub/shower;Rolling Walker (2 wheels)      Prior Function                       Hand Dominance        Extremity/Trunk Assessment   Upper Extremity Assessment Upper Extremity Assessment: Generalized weakness    Lower Extremity Assessment Lower Extremity Assessment: Generalized weakness (2+/5 BLE knee extension in sitting)    Cervical / Trunk Assessment Cervical / Trunk Assessment:  (LSO)  Communication      Cognition Arousal/Alertness: Awake/alert Behavior During Therapy: WFL for tasks assessed/performed Overall Cognitive Status: Within Functional Limits for tasks assessed                                 General Comments: Appears AxO but poor awareness & ability to correct posterior lean/LOB in sitting, question some cognitive deficits        General Comments General comments (skin integrity, edema, etc.): Pt on 6L/min via nasal cannula with  SPO2 >90% throughout    Exercises     Assessment/Plan    PT Assessment Patient needs continued PT services  PT Problem List Decreased strength;Decreased mobility;Decreased balance;Decreased knowledge of use of DME;Pain;Decreased activity tolerance;Cardiopulmonary status limiting activity;Decreased safety awareness;Decreased knowledge of precautions       PT Treatment Interventions DME instruction;Gait training;Stair training;Functional mobility training;Therapeutic activities;Therapeutic exercise;Balance training;Patient/family education;Neuromuscular re-education;Manual techniques;Modalities    PT Goals (Current goals can be found in the Care Plan section)  Acute Rehab PT Goals Patient Stated Goal: decreased pain, get better PT Goal Formulation: With patient/family Time For Goal Achievement: 11/10/21 Potential to Achieve Goals: Fair    Frequency 7X/week     Co-evaluation               AM-PAC PT "6 Clicks" Mobility  Outcome Measure Help needed turning from your back to your side while in a flat bed without using bedrails?: A Little Help needed moving from lying on your back to sitting on the side of a flat bed without using bedrails?: A Lot Help needed moving to and from a bed to a chair (including a wheelchair)?: Total Help needed standing up from a chair using your arms (e.g., wheelchair or bedside chair)?: A Lot Help needed to walk in hospital room?: Total Help needed climbing 3-5 steps  with a railing? : Total 6 Click Score: 10    End of Session Equipment Utilized During Treatment: Gait belt;Oxygen Activity Tolerance: Patient limited by fatigue Patient left: in chair;with chair alarm set;with call bell/phone within reach Nurse Communication: Mobility status PT Visit Diagnosis: Unsteadiness on feet (R26.81);Other abnormalities of gait and mobility (R26.89);Difficulty in walking, not elsewhere classified (R26.2);Muscle weakness (generalized) (M62.81);Pain Pain - part  of body:  (low back)    Time: 1002-1029 PT Time Calculation (min) (ACUTE ONLY): 27 min   Charges:   PT Evaluation $PT Eval Moderate Complexity: 1 Mod PT Treatments $Therapeutic Activity: 8-22 mins        Aleda Grana, PT, DPT 11/03/21, 10:47 AM   Sandi Mariscal 11/03/2021, 10:41 AM

## 2021-11-03 NOTE — TOC Progression Note (Signed)
Transition of Care [facility]) - Progression Note    Patient Details  Name: Amy Hensley MRN: [id number] Date of Birth: 01/06/1933  Transition of Care [facility]) CM/SW Contact  Marlowe Sax, RN Phone Number: 11/03/2021, 11:49 AM  Clinical Narrative:    Patient is agreeable to STR SNF bedsearch, she has been at [facility] since 10/20/21, She is open to going back to [facility] if they still have a bed but her husband would also like see what else is an option, Bedsearch semt, FL2 completed, PASSR obtained, will review bed offers once obtained, likely to return to [facility]        Expected Discharge Plan and Services                                                 Social Determinants of Health (SDOH) Interventions    Readmission Risk Interventions     No data to display

## 2021-11-04 DIAGNOSIS — J9601 Acute respiratory failure with hypoxia: Secondary | ICD-10-CM | POA: Diagnosis not present

## 2021-11-04 LAB — BASIC METABOLIC PANEL
Anion gap: 6 (ref 5–15)
BUN: 25 mg/dL — ABNORMAL HIGH (ref 8–23)
CO2: 24 mmol/L (ref 22–32)
Calcium: 9 mg/dL (ref 8.9–10.3)
Chloride: 107 mmol/L (ref 98–111)
Creatinine, Ser: 1.42 mg/dL — ABNORMAL HIGH (ref 0.44–1.00)
GFR, Estimated: 36 mL/min — ABNORMAL LOW (ref >60)
Glucose, Bld: 101 mg/dL — ABNORMAL HIGH (ref 70–99)
Potassium: 3.8 mmol/L (ref 3.5–5.1)
Sodium: 137 mmol/L (ref 135–145)

## 2021-11-04 LAB — CBC
HCT: 31.9 % — ABNORMAL LOW (ref 36.0–46.0)
Hemoglobin: 10.1 g/dL — ABNORMAL LOW (ref 12.0–15.0)
MCH: 30.5 pg (ref 26.0–34.0)
MCHC: 31.7 g/dL (ref 30.0–36.0)
MCV: 96.4 fL (ref 80.0–100.0)
Platelets: 280 10*3/uL (ref 150–400)
RBC: 3.31 MIL/uL — ABNORMAL LOW (ref 3.87–5.11)
RDW: 13.8 % (ref 11.5–15.5)
WBC: 9.7 10*3/uL (ref 4.0–10.5)
nRBC: 0 % (ref 0.0–0.2)

## 2021-11-04 LAB — BRAIN NATRIURETIC PEPTIDE: B Natriuretic Peptide: 300.8 pg/mL — ABNORMAL HIGH (ref 0.0–100.0)

## 2021-11-04 MED ORDER — RISAQUAD PO CAPS
2.0000 | ORAL_CAPSULE | Freq: Three times a day (TID) | ORAL | Status: DC
  Administered 2021-11-04 – 2021-11-06 (×6): 2 via ORAL
  Filled 2021-11-04 (×6): qty 2

## 2021-11-04 MED ORDER — FUROSEMIDE 20 MG PO TABS
20.0000 mg | ORAL_TABLET | Freq: Once | ORAL | Status: AC
Start: 2021-11-04 — End: 2021-11-04
  Administered 2021-11-04: 20 mg via ORAL
  Filled 2021-11-04: qty 1

## 2021-11-04 NOTE — TOC Progression Note (Addendum)
Transition of Care [facility]) - Progression Note    Patient Details  Name: Amy Hensley MRN: [id number] Date of Birth: 10/16/1932  [facility]) CM/SW Contact  Liliana Cline, LCSW Phone Number: 11/04/2021, 1:33 PM  Clinical Narrative:   CSW reached out to Tammy at [facility] who says patient can come back there if desired. Spoke to patient. Provided current bed offer at [facility] or [facility].  Patient states she prefers to return to [facility]. Per MD patient should be medically ready on Monday. CSW started insurance auth for SNF and [facility] with [employer].  Tammy with [facility] confirmed patient can come back Monday pending auth and pending patient being medically ready.          Expected Discharge Plan and Services                                                 Social Determinants of Health (SDOH) Interventions    Readmission Risk Interventions     No data to display

## 2021-11-05 DIAGNOSIS — J9601 Acute respiratory failure with hypoxia: Secondary | ICD-10-CM | POA: Diagnosis not present

## 2021-11-05 LAB — CREATININE, SERUM
Creatinine, Ser: 1.42 mg/dL — ABNORMAL HIGH (ref 0.44–1.00)
GFR, Estimated: 36 mL/min — ABNORMAL LOW (ref >60)

## 2021-11-05 LAB — POTASSIUM: Potassium: 3.7 mmol/L (ref 3.5–5.1)

## 2021-11-05 MED ORDER — ENSURE ENLIVE PO LIQD
237.0000 mL | Freq: Three times a day (TID) | ORAL | Status: DC
  Administered 2021-11-05 (×3): 237 mL via ORAL

## 2021-11-05 MED ORDER — MECLIZINE HCL 25 MG PO TABS
12.5000 mg | ORAL_TABLET | Freq: Three times a day (TID) | ORAL | Status: DC
  Administered 2021-11-05 – 2021-11-06 (×4): 12.5 mg via ORAL
  Filled 2021-11-05 (×5): qty 0.5

## 2021-11-06 DIAGNOSIS — M8430XA Stress fracture, unspecified site, initial encounter for fracture: Secondary | ICD-10-CM | POA: Diagnosis not present

## 2021-11-06 DIAGNOSIS — R4182 Altered mental status, unspecified: Secondary | ICD-10-CM | POA: Diagnosis not present

## 2021-11-06 DIAGNOSIS — M5137 Other intervertebral disc degeneration, lumbosacral region: Secondary | ICD-10-CM | POA: Diagnosis not present

## 2021-11-06 DIAGNOSIS — K5900 Constipation, unspecified: Secondary | ICD-10-CM | POA: Diagnosis not present

## 2021-11-06 DIAGNOSIS — S32010A Wedge compression fracture of first lumbar vertebra, initial encounter for closed fracture: Secondary | ICD-10-CM | POA: Diagnosis not present

## 2021-11-06 DIAGNOSIS — S32020D Wedge compression fracture of second lumbar vertebra, subsequent encounter for fracture with routine healing: Secondary | ICD-10-CM | POA: Diagnosis not present

## 2021-11-06 DIAGNOSIS — J439 Emphysema, unspecified: Secondary | ICD-10-CM | POA: Diagnosis not present

## 2021-11-06 DIAGNOSIS — N1832 Chronic kidney disease, stage 3b: Secondary | ICD-10-CM | POA: Diagnosis not present

## 2021-11-06 DIAGNOSIS — J9811 Atelectasis: Secondary | ICD-10-CM | POA: Diagnosis not present

## 2021-11-06 DIAGNOSIS — Z79890 Hormone replacement therapy (postmenopausal): Secondary | ICD-10-CM | POA: Diagnosis not present

## 2021-11-06 DIAGNOSIS — Z885 Allergy status to narcotic agent status: Secondary | ICD-10-CM | POA: Diagnosis not present

## 2021-11-06 DIAGNOSIS — Z515 Encounter for palliative care: Secondary | ICD-10-CM | POA: Diagnosis not present

## 2021-11-06 DIAGNOSIS — J690 Pneumonitis due to inhalation of food and vomit: Secondary | ICD-10-CM | POA: Diagnosis not present

## 2021-11-06 DIAGNOSIS — I517 Cardiomegaly: Secondary | ICD-10-CM | POA: Diagnosis not present

## 2021-11-06 DIAGNOSIS — S32020G Wedge compression fracture of second lumbar vertebra, subsequent encounter for fracture with delayed healing: Secondary | ICD-10-CM | POA: Diagnosis not present

## 2021-11-06 DIAGNOSIS — Z79899 Other long term (current) drug therapy: Secondary | ICD-10-CM | POA: Diagnosis not present

## 2021-11-06 DIAGNOSIS — Z66 Do not resuscitate: Secondary | ICD-10-CM | POA: Diagnosis not present

## 2021-11-06 DIAGNOSIS — J189 Pneumonia, unspecified organism: Secondary | ICD-10-CM | POA: Diagnosis not present

## 2021-11-06 DIAGNOSIS — M4856XA Collapsed vertebra, not elsewhere classified, lumbar region, initial encounter for fracture: Secondary | ICD-10-CM | POA: Diagnosis not present

## 2021-11-06 DIAGNOSIS — Z87891 Personal history of nicotine dependence: Secondary | ICD-10-CM | POA: Diagnosis not present

## 2021-11-06 DIAGNOSIS — E569 Vitamin deficiency, unspecified: Secondary | ICD-10-CM | POA: Diagnosis not present

## 2021-11-06 DIAGNOSIS — R1310 Dysphagia, unspecified: Secondary | ICD-10-CM | POA: Diagnosis not present

## 2021-11-06 DIAGNOSIS — R5381 Other malaise: Secondary | ICD-10-CM | POA: Diagnosis not present

## 2021-11-06 DIAGNOSIS — M4854XA Collapsed vertebra, not elsewhere classified, thoracic region, initial encounter for fracture: Secondary | ICD-10-CM | POA: Diagnosis not present

## 2021-11-06 DIAGNOSIS — I10 Essential (primary) hypertension: Secondary | ICD-10-CM | POA: Diagnosis not present

## 2021-11-06 DIAGNOSIS — Z9889 Other specified postprocedural states: Secondary | ICD-10-CM | POA: Diagnosis not present

## 2021-11-06 DIAGNOSIS — J9601 Acute respiratory failure with hypoxia: Secondary | ICD-10-CM | POA: Diagnosis not present

## 2021-11-06 DIAGNOSIS — R413 Other amnesia: Secondary | ICD-10-CM | POA: Diagnosis not present

## 2021-11-06 DIAGNOSIS — Z743 Need for continuous supervision: Secondary | ICD-10-CM | POA: Diagnosis not present

## 2021-11-06 DIAGNOSIS — Z9181 History of falling: Secondary | ICD-10-CM | POA: Diagnosis not present

## 2021-11-06 DIAGNOSIS — M549 Dorsalgia, unspecified: Secondary | ICD-10-CM | POA: Diagnosis not present

## 2021-11-06 DIAGNOSIS — R300 Dysuria: Secondary | ICD-10-CM | POA: Diagnosis not present

## 2021-11-06 DIAGNOSIS — Z741 Need for assistance with personal care: Secondary | ICD-10-CM | POA: Diagnosis not present

## 2021-11-06 DIAGNOSIS — R2681 Unsteadiness on feet: Secondary | ICD-10-CM | POA: Diagnosis not present

## 2021-11-06 DIAGNOSIS — J90 Pleural effusion, not elsewhere classified: Secondary | ICD-10-CM | POA: Diagnosis not present

## 2021-11-06 DIAGNOSIS — B961 Klebsiella pneumoniae [K. pneumoniae] as the cause of diseases classified elsewhere: Secondary | ICD-10-CM | POA: Diagnosis not present

## 2021-11-06 DIAGNOSIS — E039 Hypothyroidism, unspecified: Secondary | ICD-10-CM | POA: Diagnosis not present

## 2021-11-06 DIAGNOSIS — Z888 Allergy status to other drugs, medicaments and biological substances status: Secondary | ICD-10-CM | POA: Diagnosis not present

## 2021-11-06 DIAGNOSIS — J9600 Acute respiratory failure, unspecified whether with hypoxia or hypercapnia: Secondary | ICD-10-CM | POA: Diagnosis not present

## 2021-11-06 DIAGNOSIS — M5416 Radiculopathy, lumbar region: Secondary | ICD-10-CM | POA: Diagnosis not present

## 2021-11-06 DIAGNOSIS — M316 Other giant cell arteritis: Secondary | ICD-10-CM | POA: Diagnosis not present

## 2021-11-06 DIAGNOSIS — S3210XS Unspecified fracture of sacrum, sequela: Secondary | ICD-10-CM | POA: Diagnosis not present

## 2021-11-06 DIAGNOSIS — M545 Low back pain, unspecified: Secondary | ICD-10-CM | POA: Diagnosis not present

## 2021-11-06 DIAGNOSIS — R0902 Hypoxemia: Secondary | ICD-10-CM | POA: Diagnosis not present

## 2021-11-06 DIAGNOSIS — M6259 Muscle wasting and atrophy, not elsewhere classified, multiple sites: Secondary | ICD-10-CM | POA: Diagnosis not present

## 2021-11-06 DIAGNOSIS — A419 Sepsis, unspecified organism: Secondary | ICD-10-CM | POA: Diagnosis not present

## 2021-11-06 DIAGNOSIS — R000 Tachycardia, unspecified: Secondary | ICD-10-CM | POA: Diagnosis not present

## 2021-11-06 DIAGNOSIS — S22000A Wedge compression fracture of unspecified thoracic vertebra, initial encounter for closed fracture: Secondary | ICD-10-CM | POA: Diagnosis not present

## 2021-11-06 DIAGNOSIS — R059 Cough, unspecified: Secondary | ICD-10-CM | POA: Diagnosis not present

## 2021-11-06 DIAGNOSIS — I129 Hypertensive chronic kidney disease with stage 1 through stage 4 chronic kidney disease, or unspecified chronic kidney disease: Secondary | ICD-10-CM | POA: Diagnosis not present

## 2021-11-06 DIAGNOSIS — R109 Unspecified abdominal pain: Secondary | ICD-10-CM | POA: Diagnosis not present

## 2021-11-06 DIAGNOSIS — S81002A Unspecified open wound, left knee, initial encounter: Secondary | ICD-10-CM | POA: Diagnosis not present

## 2021-11-06 DIAGNOSIS — D72829 Elevated white blood cell count, unspecified: Secondary | ICD-10-CM | POA: Diagnosis not present

## 2021-11-06 DIAGNOSIS — M6281 Muscle weakness (generalized): Secondary | ICD-10-CM | POA: Diagnosis not present

## 2021-11-06 DIAGNOSIS — M5136 Other intervertebral disc degeneration, lumbar region: Secondary | ICD-10-CM | POA: Diagnosis not present

## 2021-11-06 DIAGNOSIS — L89152 Pressure ulcer of sacral region, stage 2: Secondary | ICD-10-CM | POA: Diagnosis not present

## 2021-11-06 DIAGNOSIS — B1920 Unspecified viral hepatitis C without hepatic coma: Secondary | ICD-10-CM | POA: Diagnosis not present

## 2021-11-06 DIAGNOSIS — Z20822 Contact with and (suspected) exposure to covid-19: Secondary | ICD-10-CM | POA: Diagnosis not present

## 2021-11-06 DIAGNOSIS — G8929 Other chronic pain: Secondary | ICD-10-CM | POA: Diagnosis not present

## 2021-11-06 LAB — BASIC METABOLIC PANEL
Anion gap: 9 (ref 5–15)
BUN: 28 mg/dL — ABNORMAL HIGH (ref 8–23)
CO2: 22 mmol/L (ref 22–32)
Calcium: 9.4 mg/dL (ref 8.9–10.3)
Chloride: 108 mmol/L (ref 98–111)
Creatinine, Ser: 1.25 mg/dL — ABNORMAL HIGH (ref 0.44–1.00)
GFR, Estimated: 42 mL/min — ABNORMAL LOW (ref >60)
Glucose, Bld: 146 mg/dL — ABNORMAL HIGH (ref 70–99)
Potassium: 4 mmol/L (ref 3.5–5.1)
Sodium: 139 mmol/L (ref 135–145)

## 2021-11-06 LAB — MAGNESIUM: Magnesium: 2.3 mg/dL (ref 1.7–2.4)

## 2021-11-06 MED ORDER — GUAIFENESIN ER 600 MG PO TB12: 600.0000 mg | ORAL_TABLET | Freq: Two times a day (BID) | ORAL | Status: AC

## 2021-11-06 MED ORDER — LORATADINE 10 MG PO TABS: 10.0000 mg | ORAL_TABLET | Freq: Every day | ORAL | Status: DC

## 2021-11-06 MED ORDER — ENSURE ENLIVE PO LIQD: 237.0000 mL | Freq: Three times a day (TID) | ORAL | 12 refills | Status: AC

## 2021-11-06 MED ORDER — PREDNISONE 2.5 MG PO TABS: 17.5000 mg | ORAL_TABLET | Freq: Every day | ORAL | Status: DC

## 2021-11-06 MED ORDER — MECLIZINE HCL 25 MG PO TABS: 25.0000 mg | ORAL_TABLET | Freq: Three times a day (TID) | ORAL | Status: DC

## 2021-11-06 MED ORDER — LIDOCAINE 5 % EX PTCH: 1.0000 | MEDICATED_PATCH | CUTANEOUS | 0 refills | Status: DC

## 2021-11-06 MED ORDER — CYCLOBENZAPRINE HCL 5 MG PO TABS
5.0000 mg | ORAL_TABLET | Freq: Three times a day (TID) | ORAL | 0 refills | Status: AC | PRN
Start: 2021-11-06 — End: 2021-11-10

## 2021-11-06 MED ORDER — OXYCODONE HCL 5 MG PO TABS: 5.0000 mg | ORAL_TABLET | ORAL | 0 refills | Status: AC | PRN

## 2021-11-06 NOTE — Progress Notes (Signed)
Discharge instructions were given and discussed with Logan, Lpn. The receiving the  nurse from [facility]. No other concerns noted or questioned from accepting nurse or family member that was present at time of discharge. IV removed and no issues noted at time of removal. Patient is currently awaiting transport.

## 2021-11-06 NOTE — Plan of Care (Signed)

## 2021-11-08 DIAGNOSIS — M6281 Muscle weakness (generalized): Secondary | ICD-10-CM | POA: Diagnosis not present

## 2021-11-08 DIAGNOSIS — M316 Other giant cell arteritis: Secondary | ICD-10-CM | POA: Diagnosis not present

## 2021-11-08 DIAGNOSIS — J9600 Acute respiratory failure, unspecified whether with hypoxia or hypercapnia: Secondary | ICD-10-CM | POA: Diagnosis not present

## 2021-11-08 DIAGNOSIS — M545 Low back pain, unspecified: Secondary | ICD-10-CM | POA: Diagnosis not present

## 2021-11-09 ENCOUNTER — Ambulatory Visit
Admission: RE | Admit: 2021-11-09 | Discharge: 2021-11-09 | Disposition: A | Discharge status: Home / Self Care | Payer: PPO | Source: Ambulatory Visit | Attending: Radiology | Admitting: Radiology

## 2021-11-09 DIAGNOSIS — Z9889 Other specified postprocedural states: Secondary | ICD-10-CM | POA: Diagnosis not present

## 2021-11-09 DIAGNOSIS — S32010A Wedge compression fracture of first lumbar vertebra, initial encounter for closed fracture: Secondary | ICD-10-CM | POA: Diagnosis not present

## 2021-11-09 DIAGNOSIS — S32000A Wedge compression fracture of unspecified lumbar vertebra, initial encounter for closed fracture: Secondary | ICD-10-CM

## 2021-11-09 HISTORY — PX: IR RADIOLOGIST EVAL & MGMT: IMG5224

## 2021-11-09 NOTE — Progress Notes (Signed)
IR progress note   I spoke to the patient's husband, Amy Hensley, on the phone today at approximately 1400 while attempting to reach the patient.  He informed me that the patient had initially done well after the L2 kyphoplasty that was performed on October 18, 2021 while she was an inpatient at [facility].  Her back pain was improving while she was at rehab, but then she unfortunately suffered a fall while in the shower which resulted in her being readmitted for several days.  Imaging at that time (lumbar spine radiograph dated November 01, 2021) demonstrated a new compression deformity of the L1 vertebral body.  He believes that there is now new back pain contributing to her stay at the rehab center.    I reviewed the patient's notes from her most recent hospitalization, and it appears that Dr. Cari Caraway was made aware of this fracture and plans to follow-up with her in approximately 1-2 weeks.  Pending Dr. Nelly Laurence consultation, I informed the patient's husband I may be able to see her for repeat kyphoplasty if appropriate.    Albin Felling, MD  Vascular and Interventional Radiology 11/09/2021 2:43 PM

## 2021-11-16 DIAGNOSIS — M545 Low back pain, unspecified: Secondary | ICD-10-CM | POA: Diagnosis not present

## 2021-11-16 DIAGNOSIS — M316 Other giant cell arteritis: Secondary | ICD-10-CM | POA: Diagnosis not present

## 2021-11-16 DIAGNOSIS — M6281 Muscle weakness (generalized): Secondary | ICD-10-CM | POA: Diagnosis not present

## 2021-11-16 DIAGNOSIS — J9600 Acute respiratory failure, unspecified whether with hypoxia or hypercapnia: Secondary | ICD-10-CM | POA: Diagnosis not present

## 2021-11-17 DIAGNOSIS — N1832 Chronic kidney disease, stage 3b: Secondary | ICD-10-CM | POA: Diagnosis not present

## 2021-11-17 DIAGNOSIS — M545 Low back pain, unspecified: Secondary | ICD-10-CM | POA: Diagnosis not present

## 2021-11-17 DIAGNOSIS — D72829 Elevated white blood cell count, unspecified: Secondary | ICD-10-CM | POA: Diagnosis not present

## 2021-11-17 DIAGNOSIS — J9600 Acute respiratory failure, unspecified whether with hypoxia or hypercapnia: Secondary | ICD-10-CM | POA: Diagnosis not present

## 2021-11-22 DIAGNOSIS — D72829 Elevated white blood cell count, unspecified: Secondary | ICD-10-CM | POA: Diagnosis not present

## 2021-11-22 DIAGNOSIS — J189 Pneumonia, unspecified organism: Secondary | ICD-10-CM | POA: Diagnosis not present

## 2021-11-22 DIAGNOSIS — N1832 Chronic kidney disease, stage 3b: Secondary | ICD-10-CM | POA: Diagnosis not present

## 2021-11-23 ENCOUNTER — Ambulatory Visit: Payer: Self-pay | Admitting: Neurosurgery

## 2021-11-27 DIAGNOSIS — K5900 Constipation, unspecified: Secondary | ICD-10-CM | POA: Diagnosis not present

## 2021-11-27 DIAGNOSIS — S81002A Unspecified open wound, left knee, initial encounter: Secondary | ICD-10-CM | POA: Diagnosis not present

## 2021-11-27 DIAGNOSIS — J189 Pneumonia, unspecified organism: Secondary | ICD-10-CM | POA: Diagnosis not present

## 2021-11-30 ENCOUNTER — Inpatient Hospital Stay
Admission: EM | Admit: 2021-11-30 | Discharge: 2021-12-05 | DRG: 177 | Disposition: A | Discharge status: Skilled Nursing Facility | Payer: PPO | Source: Skilled Nursing Facility | Attending: Obstetrics and Gynecology | Admitting: Obstetrics and Gynecology

## 2021-11-30 ENCOUNTER — Emergency Department: Payer: PPO

## 2021-11-30 ENCOUNTER — Encounter: Payer: Self-pay | Admitting: Emergency Medicine

## 2021-11-30 ENCOUNTER — Other Ambulatory Visit: Payer: Self-pay

## 2021-11-30 DIAGNOSIS — N1832 Chronic kidney disease, stage 3b: Secondary | ICD-10-CM | POA: Diagnosis not present

## 2021-11-30 DIAGNOSIS — J9601 Acute respiratory failure with hypoxia: Secondary | ICD-10-CM | POA: Diagnosis present

## 2021-11-30 DIAGNOSIS — S3210XS Unspecified fracture of sacrum, sequela: Secondary | ICD-10-CM | POA: Diagnosis not present

## 2021-11-30 DIAGNOSIS — R0902 Hypoxemia: Secondary | ICD-10-CM | POA: Diagnosis not present

## 2021-11-30 DIAGNOSIS — L8990 Pressure ulcer of unspecified site, unspecified stage: Secondary | ICD-10-CM | POA: Insufficient documentation

## 2021-11-30 DIAGNOSIS — Z888 Allergy status to other drugs, medicaments and biological substances status: Secondary | ICD-10-CM | POA: Diagnosis not present

## 2021-11-30 DIAGNOSIS — Z20822 Contact with and (suspected) exposure to covid-19: Secondary | ICD-10-CM | POA: Diagnosis not present

## 2021-11-30 DIAGNOSIS — M4854XA Collapsed vertebra, not elsewhere classified, thoracic region, initial encounter for fracture: Secondary | ICD-10-CM | POA: Diagnosis not present

## 2021-11-30 DIAGNOSIS — G8929 Other chronic pain: Secondary | ICD-10-CM | POA: Diagnosis not present

## 2021-11-30 DIAGNOSIS — I129 Hypertensive chronic kidney disease with stage 1 through stage 4 chronic kidney disease, or unspecified chronic kidney disease: Secondary | ICD-10-CM | POA: Diagnosis not present

## 2021-11-30 DIAGNOSIS — M5137 Other intervertebral disc degeneration, lumbosacral region: Secondary | ICD-10-CM | POA: Diagnosis not present

## 2021-11-30 DIAGNOSIS — S22000A Wedge compression fracture of unspecified thoracic vertebra, initial encounter for closed fracture: Secondary | ICD-10-CM

## 2021-11-30 DIAGNOSIS — E569 Vitamin deficiency, unspecified: Secondary | ICD-10-CM | POA: Diagnosis not present

## 2021-11-30 DIAGNOSIS — E039 Hypothyroidism, unspecified: Secondary | ICD-10-CM | POA: Diagnosis not present

## 2021-11-30 DIAGNOSIS — Z79899 Other long term (current) drug therapy: Secondary | ICD-10-CM | POA: Diagnosis not present

## 2021-11-30 DIAGNOSIS — I10 Essential (primary) hypertension: Secondary | ICD-10-CM | POA: Diagnosis not present

## 2021-11-30 DIAGNOSIS — M545 Low back pain, unspecified: Secondary | ICD-10-CM | POA: Diagnosis not present

## 2021-11-30 DIAGNOSIS — J9621 Acute and chronic respiratory failure with hypoxia: Secondary | ICD-10-CM | POA: Diagnosis present

## 2021-11-30 DIAGNOSIS — J9811 Atelectasis: Secondary | ICD-10-CM | POA: Diagnosis present

## 2021-11-30 DIAGNOSIS — Z515 Encounter for palliative care: Secondary | ICD-10-CM | POA: Diagnosis not present

## 2021-11-30 DIAGNOSIS — J690 Pneumonitis due to inhalation of food and vomit: Principal | ICD-10-CM

## 2021-11-30 DIAGNOSIS — S32020G Wedge compression fracture of second lumbar vertebra, subsequent encounter for fracture with delayed healing: Secondary | ICD-10-CM | POA: Diagnosis not present

## 2021-11-30 DIAGNOSIS — Z87891 Personal history of nicotine dependence: Secondary | ICD-10-CM | POA: Diagnosis not present

## 2021-11-30 DIAGNOSIS — R059 Cough, unspecified: Secondary | ICD-10-CM | POA: Diagnosis not present

## 2021-11-30 DIAGNOSIS — I517 Cardiomegaly: Secondary | ICD-10-CM | POA: Diagnosis not present

## 2021-11-30 DIAGNOSIS — R000 Tachycardia, unspecified: Secondary | ICD-10-CM | POA: Diagnosis not present

## 2021-11-30 DIAGNOSIS — A419 Sepsis, unspecified organism: Secondary | ICD-10-CM | POA: Diagnosis not present

## 2021-11-30 DIAGNOSIS — L89152 Pressure ulcer of sacral region, stage 2: Secondary | ICD-10-CM | POA: Diagnosis not present

## 2021-11-30 DIAGNOSIS — Z79890 Hormone replacement therapy (postmenopausal): Secondary | ICD-10-CM | POA: Diagnosis not present

## 2021-11-30 DIAGNOSIS — M549 Dorsalgia, unspecified: Secondary | ICD-10-CM | POA: Diagnosis not present

## 2021-11-30 DIAGNOSIS — M5416 Radiculopathy, lumbar region: Secondary | ICD-10-CM | POA: Diagnosis not present

## 2021-11-30 DIAGNOSIS — M4856XA Collapsed vertebra, not elsewhere classified, lumbar region, initial encounter for fracture: Secondary | ICD-10-CM | POA: Diagnosis not present

## 2021-11-30 DIAGNOSIS — Z7401 Bed confinement status: Secondary | ICD-10-CM | POA: Diagnosis not present

## 2021-11-30 DIAGNOSIS — Z9181 History of falling: Secondary | ICD-10-CM | POA: Diagnosis not present

## 2021-11-30 DIAGNOSIS — M316 Other giant cell arteritis: Secondary | ICD-10-CM | POA: Diagnosis not present

## 2021-11-30 DIAGNOSIS — J439 Emphysema, unspecified: Secondary | ICD-10-CM | POA: Diagnosis not present

## 2021-11-30 DIAGNOSIS — S32010A Wedge compression fracture of first lumbar vertebra, initial encounter for closed fracture: Secondary | ICD-10-CM

## 2021-11-30 DIAGNOSIS — B961 Klebsiella pneumoniae [K. pneumoniae] as the cause of diseases classified elsewhere: Secondary | ICD-10-CM | POA: Diagnosis present

## 2021-11-30 DIAGNOSIS — R2681 Unsteadiness on feet: Secondary | ICD-10-CM | POA: Diagnosis not present

## 2021-11-30 DIAGNOSIS — B1920 Unspecified viral hepatitis C without hepatic coma: Secondary | ICD-10-CM | POA: Diagnosis present

## 2021-11-30 DIAGNOSIS — R4789 Other speech disturbances: Secondary | ICD-10-CM | POA: Diagnosis not present

## 2021-11-30 DIAGNOSIS — M5136 Other intervertebral disc degeneration, lumbar region: Secondary | ICD-10-CM | POA: Diagnosis not present

## 2021-11-30 DIAGNOSIS — S32020D Wedge compression fracture of second lumbar vertebra, subsequent encounter for fracture with routine healing: Secondary | ICD-10-CM | POA: Diagnosis not present

## 2021-11-30 DIAGNOSIS — Z66 Do not resuscitate: Secondary | ICD-10-CM | POA: Diagnosis not present

## 2021-11-30 DIAGNOSIS — J189 Pneumonia, unspecified organism: Secondary | ICD-10-CM

## 2021-11-30 DIAGNOSIS — R413 Other amnesia: Secondary | ICD-10-CM | POA: Diagnosis not present

## 2021-11-30 DIAGNOSIS — R1310 Dysphagia, unspecified: Secondary | ICD-10-CM | POA: Diagnosis present

## 2021-11-30 DIAGNOSIS — J159 Unspecified bacterial pneumonia: Secondary | ICD-10-CM | POA: Diagnosis not present

## 2021-11-30 DIAGNOSIS — R109 Unspecified abdominal pain: Secondary | ICD-10-CM | POA: Diagnosis not present

## 2021-11-30 DIAGNOSIS — Z885 Allergy status to narcotic agent status: Secondary | ICD-10-CM | POA: Diagnosis not present

## 2021-11-30 DIAGNOSIS — I959 Hypotension, unspecified: Secondary | ICD-10-CM | POA: Diagnosis not present

## 2021-11-30 DIAGNOSIS — R4182 Altered mental status, unspecified: Secondary | ICD-10-CM | POA: Diagnosis not present

## 2021-11-30 DIAGNOSIS — M6259 Muscle wasting and atrophy, not elsewhere classified, multiple sites: Secondary | ICD-10-CM | POA: Diagnosis not present

## 2021-11-30 DIAGNOSIS — M6281 Muscle weakness (generalized): Secondary | ICD-10-CM | POA: Diagnosis not present

## 2021-11-30 LAB — CBC WITH DIFFERENTIAL/PLATELET
Abs Immature Granulocytes: 0.2 10*3/uL — ABNORMAL HIGH (ref 0.00–0.07)
Basophils Absolute: 0.1 10*3/uL (ref 0.0–0.1)
Basophils Relative: 0 %
Eosinophils Absolute: 0 10*3/uL (ref 0.0–0.5)
Eosinophils Relative: 0 %
HCT: 37.6 % (ref 36.0–46.0)
Hemoglobin: 11.8 g/dL — ABNORMAL LOW (ref 12.0–15.0)
Immature Granulocytes: 1 %
Lymphocytes Relative: 5 %
Lymphs Abs: 0.9 10*3/uL (ref 0.7–4.0)
MCH: 29.9 pg (ref 26.0–34.0)
MCHC: 31.4 g/dL (ref 30.0–36.0)
MCV: 95.4 fL (ref 80.0–100.0)
Monocytes Absolute: 0.7 10*3/uL (ref 0.1–1.0)
Monocytes Relative: 4 %
Neutro Abs: 17.1 10*3/uL — ABNORMAL HIGH (ref 1.7–7.7)
Neutrophils Relative %: 90 %
Platelets: 376 10*3/uL (ref 150–400)
RBC: 3.94 MIL/uL (ref 3.87–5.11)
RDW: 13.9 % (ref 11.5–15.5)
WBC: 18.9 10*3/uL — ABNORMAL HIGH (ref 4.0–10.5)
nRBC: 0 % (ref 0.0–0.2)

## 2021-11-30 LAB — URINALYSIS, ROUTINE W REFLEX MICROSCOPIC
Bilirubin Urine: NEGATIVE
Glucose, UA: NEGATIVE mg/dL
Hgb urine dipstick: NEGATIVE
Ketones, ur: NEGATIVE mg/dL
Leukocytes,Ua: NEGATIVE
Nitrite: NEGATIVE
Protein, ur: NEGATIVE mg/dL
Specific Gravity, Urine: 1.015 (ref 1.005–1.030)
pH: 7 (ref 5.0–8.0)

## 2021-11-30 LAB — LACTIC ACID, PLASMA
Lactic Acid, Venous: 1.2 mmol/L (ref 0.5–1.9)
Lactic Acid, Venous: 0.9 mmol/L (ref 0.5–1.9)

## 2021-11-30 LAB — PROTIME-INR
INR: 1 (ref 0.8–1.2)
Prothrombin Time: 13 seconds (ref 11.4–15.2)

## 2021-11-30 LAB — COMPREHENSIVE METABOLIC PANEL
ALT: 15 U/L (ref 0–44)
AST: 16 U/L (ref 15–41)
Albumin: 3.7 g/dL (ref 3.5–5.0)
Alkaline Phosphatase: 100 U/L (ref 38–126)
Anion gap: 12 (ref 5–15)
BUN: 31 mg/dL — ABNORMAL HIGH (ref 8–23)
CO2: 23 mmol/L (ref 22–32)
Calcium: 9.4 mg/dL (ref 8.9–10.3)
Chloride: 102 mmol/L (ref 98–111)
Creatinine, Ser: 1.57 mg/dL — ABNORMAL HIGH (ref 0.44–1.00)
GFR, Estimated: 32 mL/min — ABNORMAL LOW (ref >60)
Glucose, Bld: 100 mg/dL — ABNORMAL HIGH (ref 70–99)
Potassium: 4.1 mmol/L (ref 3.5–5.1)
Sodium: 137 mmol/L (ref 135–145)
Total Bilirubin: 1 mg/dL (ref 0.3–1.2)
Total Protein: 7.1 g/dL (ref 6.5–8.1)

## 2021-11-30 LAB — SURGICAL PCR SCREEN
MRSA, PCR: NEGATIVE
Staphylococcus aureus: NEGATIVE

## 2021-11-30 LAB — EXPECTORATED SPUTUM ASSESSMENT W GRAM STAIN, RFLX TO RESP C

## 2021-11-30 LAB — PROCALCITONIN: Procalcitonin: 0.87 ng/mL

## 2021-11-30 LAB — SARS CORONAVIRUS 2 BY RT PCR: SARS Coronavirus 2 by RT PCR: NEGATIVE

## 2021-11-30 LAB — STREP PNEUMONIAE URINARY ANTIGEN: Strep Pneumo Urinary Antigen: NEGATIVE

## 2021-11-30 MED ORDER — PANTOPRAZOLE SODIUM 40 MG PO TBEC
40.0000 mg | DELAYED_RELEASE_TABLET | Freq: Every day | ORAL | Status: DC
  Administered 2021-12-01 – 2021-12-05 (×5): 40 mg via ORAL
  Filled 2021-11-30 (×5): qty 1

## 2021-11-30 MED ORDER — SODIUM CHLORIDE 0.9 % IV SOLN
2.0000 g | INTRAVENOUS | Status: DC
  Administered 2021-12-01: 2 g via INTRAVENOUS
  Filled 2021-11-30: qty 2

## 2021-11-30 MED ORDER — SODIUM CHLORIDE 0.9 % IV BOLUS
1000.0000 mL | Freq: Once | INTRAVENOUS | Status: AC
  Administered 2021-11-30: 1000 mL via INTRAVENOUS

## 2021-11-30 MED ORDER — OXYCODONE HCL 5 MG PO TABS
5.0000 mg | ORAL_TABLET | Freq: Four times a day (QID) | ORAL | Status: DC | PRN
  Administered 2021-11-30 – 2021-12-01 (×2): 5 mg via ORAL
  Filled 2021-11-30 (×2): qty 1

## 2021-11-30 MED ORDER — ENOXAPARIN SODIUM 30 MG/0.3ML IJ SOSY
30.0000 mg | PREFILLED_SYRINGE | INTRAMUSCULAR | Status: DC
  Administered 2021-11-30 – 2021-12-04 (×5): 30 mg via SUBCUTANEOUS
  Filled 2021-11-30 (×6): qty 0.3

## 2021-11-30 MED ORDER — AMLODIPINE BESYLATE 10 MG PO TABS
10.0000 mg | ORAL_TABLET | Freq: Every day | ORAL | Status: DC
  Administered 2021-12-01 – 2021-12-05 (×5): 10 mg via ORAL
  Filled 2021-11-30 (×5): qty 1

## 2021-11-30 MED ORDER — SODIUM CHLORIDE 0.9% FLUSH: 3.0000 mL | INTRAVENOUS | Status: DC | PRN

## 2021-11-30 MED ORDER — ACETAMINOPHEN 325 MG PO TABS: 650.0000 mg | ORAL_TABLET | Freq: Once | ORAL | Status: DC

## 2021-11-30 MED ORDER — LEVOTHYROXINE SODIUM 50 MCG PO TABS
50.0000 ug | ORAL_TABLET | Freq: Every day | ORAL | Status: DC
  Administered 2021-12-01 – 2021-12-05 (×5): 50 ug via ORAL
  Filled 2021-11-30 (×5): qty 1

## 2021-11-30 MED ORDER — SODIUM CHLORIDE 0.9% FLUSH
3.0000 mL | Freq: Two times a day (BID) | INTRAVENOUS | Status: DC
  Administered 2021-11-30 – 2021-12-05 (×10): 3 mL via INTRAVENOUS

## 2021-11-30 MED ORDER — CALCITONIN (SALMON) 200 UNIT/ACT NA SOLN
1.0000 | Freq: Every day | NASAL | Status: DC
  Administered 2021-11-30 – 2021-12-04 (×4): 1 via NASAL
  Filled 2021-11-30: qty 3.7

## 2021-11-30 MED ORDER — SODIUM CHLORIDE 0.9 % IV SOLN: 250.0000 mL | INTRAVENOUS | Status: DC | PRN

## 2021-11-30 MED ORDER — PREGABALIN 50 MG PO CAPS
50.0000 mg | ORAL_CAPSULE | Freq: Two times a day (BID) | ORAL | Status: DC
  Administered 2021-11-30 – 2021-12-05 (×10): 50 mg via ORAL
  Filled 2021-11-30 (×10): qty 1

## 2021-11-30 MED ORDER — OYSTER SHELL CALCIUM/D3 500-5 MG-MCG PO TABS
1.0000 | ORAL_TABLET | Freq: Every day | ORAL | Status: DC
  Administered 2021-11-30 – 2021-12-05 (×6): 1 via ORAL
  Filled 2021-11-30 (×6): qty 1

## 2021-11-30 MED ORDER — POLYETHYLENE GLYCOL 3350 17 G PO PACK
17.0000 g | PACK | Freq: Every day | ORAL | Status: DC
  Administered 2021-11-30 – 2021-12-05 (×3): 17 g via ORAL
  Filled 2021-11-30 (×5): qty 1

## 2021-11-30 MED ORDER — VANCOMYCIN HCL IN DEXTROSE 1-5 GM/200ML-% IV SOLN
1000.0000 mg | Freq: Once | INTRAVENOUS | Status: AC
  Administered 2021-11-30: 1000 mg via INTRAVENOUS
  Filled 2021-11-30: qty 200

## 2021-11-30 MED ORDER — SODIUM CHLORIDE 0.9 % IV SOLN
2.0000 g | Freq: Once | INTRAVENOUS | Status: AC
  Administered 2021-11-30: 2 g via INTRAVENOUS
  Filled 2021-11-30: qty 12.5

## 2021-11-30 MED ORDER — IOHEXOL 350 MG/ML SOLN
75.0000 mL | Freq: Once | INTRAVENOUS | Status: AC | PRN
  Administered 2021-11-30: 75 mL via INTRAVENOUS

## 2021-11-30 NOTE — ED Notes (Signed)
Informed RN bed assigned 

## 2021-11-30 NOTE — Consult Note (Signed)
CODE SEPSIS - PHARMACY COMMUNICATION  **Broad Spectrum Antibiotics should be administered within 1 hour of Sepsis diagnosis**  Time Code Sepsis Called/Page Received: 1334  Antibiotics Ordered: cefepime and vancomycin  Time of 1st antibiotic administration: 1340  Additional action taken by pharmacy: none     Oswald Hillock ,PharmD Clinical Pharmacist  11/30/2021  1:52 PM

## 2021-11-30 NOTE — Consult Note (Signed)
PHARMACY -  BRIEF ANTIBIOTIC NOTE   Pharmacy has received consult(s) for sepsis from an ED provider.  The patient's profile has been reviewed for ht/wt/allergies/indication/available labs.    One time order(s) placed for cefepime and vancomycin  Further antibiotics/pharmacy consults should be ordered by admitting physician if indicated.                       Thank you, Oswald Hillock 11/30/2021  1:34 PM

## 2021-11-30 NOTE — ED Triage Notes (Signed)
Pt comes into the ED via EMS from [facility], with c/o left leg weakness with lower back pain, has chronic back issues, abnormal EKG.   98.5 CBG115 HR113 116/72 90%RA

## 2021-11-30 NOTE — Consult Note (Signed)
Pharmacy Antibiotic Note  Amy Hensley is a 88 y.o. female admitted on 11/30/2021 with pneumonia.  Pharmacy has been consulted for cefepime dosing.  Plan: Cefepime 2 g q24H per renal function   Height: '5\' 3"'$  (160 cm) Weight: 62.6 kg (138 lb) IBW/kg (Calculated) : 52.4  Temp (24hrs), Avg:98.6 F (37 C), Min:98.4 F (36.9 C), Max:99 F (37.2 C)  Recent Labs  Lab 11/30/21 1035 11/30/21 1339  WBC 18.9*  --   CREATININE 1.57*  --   LATICACIDVEN 1.2 0.9    Estimated Creatinine Clearance: 20.9 mL/min (A) (by C-G formula based on SCr of 1.57 mg/dL (H)).    Allergies  Allergen Reactions   Alendronate Other (See Comments)    Dyspepsia (indigestion)    Buspirone Other (See Comments)    sedation   Codeine     twitching    Antimicrobials this admission: 7/20 cefepime >>    Dose adjustments this admission: None  Microbiology results: 7/20 BCx: pending   Thank you for allowing pharmacy to be a part of this patient's care.  Oswald Hillock, PharmD, BCPS 11/30/2021 6:37 PM

## 2021-11-30 NOTE — Sepsis Progress Note (Signed)
eLink is following this Code Sepsis. °

## 2021-11-30 NOTE — ED Triage Notes (Signed)
Patient c/o back pain that has been hurting a long time however the usual pain medicine is not helping. Patient oxygen sats 87% on room air. Patient has croupy cough.

## 2021-11-30 NOTE — ED Provider Notes (Signed)
[facility] Provider Note    Event Date/Time   First MD Initiated Contact with Patient 11/30/21 1254     (approximate)   History   Back Pain   HPI  Amy Hensley is a 88 y.o. female  giant cell arteritis, chronic back pain, hypertension, hypothyroidism, CKD stage IIIb, recent kyphoplasty of L2 comes in for increasing somnolence, hypoxia.  According to the patient's husband he went into the room this morning when she is at [facility] and he noted that she was more somnolent and that she was having more generalized weakness.  They noted her to be hypoxic.  He reports that she has not needed any oxygen for a while now but had to have it previously when she was admitted for pneumonia.  She has a lot of coughing noted.  She is got some weakness in her left leg that they state is chronic from the recent back surgery but denies any new weakness.  Her pain is chronic as well in her back.  Physical Exam   Triage Vital Signs: ED Triage Vitals  Enc Vitals Group     BP 11/30/21 1025 131/63     Pulse Rate 11/30/21 1025 (!) 106     Resp 11/30/21 1025 18     Temp 11/30/21 1025 99 F (37.2 C)     Temp Source 11/30/21 1025 Oral     SpO2 11/30/21 1025 (!) 87 %     Weight 11/30/21 1028 138 lb (62.6 kg)     Height 11/30/21 1028 '5\' 3"'$  (1.6 m)     Head Circumference --      [facility] --      Pain Score 11/30/21 1025 8     Pain Loc --      Pain Edu? --      Excl. in [place]? --     Most recent vital signs: Vitals:   11/30/21 1025 11/30/21 1029  BP: 131/63   Pulse: (!) 106   Resp: 18   Temp: 99 F (37.2 C)   SpO2: (!) 87% 90%     General: Awake, no distress.  CV:  Good peripheral perfusion.  Tachycardic Resp:  Normal effort.  Coarse sounds, no increased work of breathing Abd:  No distention.  Other:  Patient has some weakness in the left leg which according to the husband is at baseline.  Good strength in the right leg.  ED Results / Procedures /  Treatments   Labs (all labs ordered are listed, but only abnormal results are displayed) Labs Reviewed  COMPREHENSIVE METABOLIC PANEL - Abnormal; Notable for the following components:      Result Value   Glucose, Bld 100 (*)    BUN 31 (*)    Creatinine, Ser 1.57 (*)    GFR, Estimated 32 (*)    All other components within normal limits  CBC WITH DIFFERENTIAL/PLATELET - Abnormal; Notable for the following components:   WBC 18.9 (*)    Hemoglobin 11.8 (*)    Neutro Abs 17.1 (*)    Abs Immature Granulocytes 0.20 (*)    All other components within normal limits  SARS CORONAVIRUS 2 BY RT PCR  CULTURE, BLOOD (ROUTINE X 2)  CULTURE, BLOOD (ROUTINE X 2)  LACTIC ACID, PLASMA  PROTIME-INR  LACTIC ACID, PLASMA  URINALYSIS, ROUTINE W REFLEX MICROSCOPIC     EKG  My interpretation of EKG:  Sinus tachycardia rate of 105 without any ST elevation but does have  T wave inversion in V2 V3 V4 V5 and V6, normal intervals  RADIOLOGY I have reviewed the CT head personally interpreted no evidence of intracranial hemorrhage   PROCEDURES:  Critical Care performed: Yes, see critical care procedure note(s)  .1-3 Lead EKG Interpretation  Performed by: Vanessa [place], MD Authorized by: Vanessa [place], MD     Interpretation: normal     ECG rate:  90   ECG rate assessment: normal     Rhythm: sinus rhythm     Ectopy: none     Conduction: normal   .Critical Care  Performed by: Vanessa [place], MD Authorized by: Vanessa [place], MD   Critical care provider statement:    Critical care time (minutes):  30   Critical care was necessary to treat or prevent imminent or life-threatening deterioration of the following conditions:  Respiratory failure   Critical care was time spent personally by me on the following activities:  Development of treatment plan with patient or surrogate, discussions with consultants, evaluation of patient's response to treatment, examination of patient, ordering and review of  laboratory studies, ordering and review of radiographic studies, ordering and performing treatments and interventions, pulse oximetry, re-evaluation of patient's condition and review of old charts    MEDICATIONS ORDERED IN ED: Medications  sodium chloride 0.9 % bolus 1,000 mL (has no administration in time range)     IMPRESSION / MDM / [place] / ED COURSE  I reviewed the triage vital signs and the nursing notes.   Patient's presentation is most consistent with acute presentation with potential threat to life or bodily function.   Differential includes pneumonia, PE, pneumothorax, ACS.  Chest x-ray with concern for potential pneumonia but recommended CT.  Given patient's prolonged hospitalizations will get CT PE to rule out pulmonary embolism.  We will give patient a liter of fluid for tachycardia given normal EF recently.  We will start patient on broad-spectrum antibiotics due to my concern for pneumonia.  She meets sepsis criteria so sepsis alert was called.  Not seem to be tender in her abdomen*lower suspicion but given her chronic back pain I am going to get a CT lumbar so we will just make sure there is no evidence of infectious process in the abdomen and make sure that there is no worsening issue of her kyphoplasty but it seems like neurologically the weakness in the left leg is been at his baseline since the surgery.  COVID-negative.  Lactate normal creatinine slightly elevated getting some IV fluid.  White count elevated at 18 coags normal.  CT imaging without PE but does show evidence of pneumonia.  She does have some new compression fractures which I suspect are the cause of some her for chronic back pain.  Her neuro exam of her legs seem to be at baseline so lower suspicion for cord compression.  Discussed with the hospital team for admission  The patient is on the cardiac monitor to evaluate for evidence of arrhythmia and/or significant heart rate changes.       FINAL CLINICAL IMPRESSION(S) / ED DIAGNOSES   Final diagnoses:  Sepsis, due to unspecified organism, unspecified whether acute organ dysfunction present ([place])  Acute respiratory failure with hypoxia ([place])     Rx / DC Orders   ED Discharge Orders     None        Note:  This document was prepared using Dragon voice recognition software and may include unintentional dictation errors.  Vanessa [place], MD 11/30/21 1520

## 2021-11-30 NOTE — H&P (Signed)
History and Physical    Amy Hensley:[id number] DOB: 1933/05/06 DOA: 11/30/2021  PCP: Adin Hector, MD  Patient coming from: SNF   Chief Complaint: dyspnea, low back pain   HPI: Amy Hensley is a 88 y.o. female with medical history significant for gca, hypothyroid, htn, ckd 3b, recent hospitalizations for compression fracture, cap, who presents with the above.  Admitted last month treated for cap, negative vq scan, discharged on O2. Earlier that month admitted for compression fracture treated with kyphoplasty of L2. She reports at rehab weaned off oxygen and improving in terms of pain but then a few weeks ago had a fall in the shower and since then has had worse low back pain. Also a cough for at least a few days, today found to be hypoxic at rehab. Cough not productive. No chest pain or fevers no dysuria.   ED Course:   Imaging  Review of Systems: As per HPI otherwise 10 point review of systems negative.    Past Medical History:  Diagnosis Date   Arthritis    Chronic kidney disease    stage 3   Hypertension    Hypothyroidism     Past Surgical History:  Procedure Laterality Date   ABDOMINAL HYSTERECTOMY     ABDOMINAL SURGERY     tacked up uterus   APPENDECTOMY     ARTERY BIOPSY N/A 05/19/2021   Procedure: BIOPSY TEMPORAL ARTERY;  Surgeon: Herbert Pun, MD;  Location: [facility];  Service: General;  Laterality: N/A;   CATARACT EXTRACTION W/PHACO Left 10/04/2016   Procedure: CATARACT EXTRACTION PHACO AND INTRAOCULAR LENS PLACEMENT ([place]);  Surgeon: Eulogio Bear, MD;  Location: [facility];  Service: Ophthalmology;  Laterality: Left;  us02:17.8 ap%19.6 cde27.31 fluid lot # [id number]   CESAREAN SECTION     EYE SURGERY     INGUINAL HERNIA REPAIR Right 12/08/2018   Procedure: OPEN RIGHT HERNIA REPAIR INGUINAL ADULT;  Surgeon: Herbert Pun, MD;  Location: [facility];  Service: General;  Laterality: Right;   IR INJECT/THERA/INC NEEDLE/CATH/PLC  EPI/LUMB/SAC W/IMG  09/26/2021   IR KYPHO LUMBAR INC FX REDUCE BONE BX UNI/BIL CANNULATION INC/IMAGING  10/18/2021   IR RADIOLOGIST EVAL & MGMT  11/09/2021     reports that she has quit smoking. Her smoking use included cigarettes. She smoked an average of 1 pack per day. She has never used smokeless tobacco. She reports that she does not drink alcohol and does not use drugs.  Allergies  Allergen Reactions   Alendronate Other (See Comments)    Dyspepsia (indigestion)    Buspirone Other (See Comments)    sedation   Codeine     twitching    History reviewed. No pertinent family history.  Prior to Admission medications   Medication Sig Start Date End Date Taking? Authorizing Provider  acetaminophen (ACETAMINOPHEN 8 HOUR) 650 MG CR tablet Take 650 mg by mouth every 8 (eight) hours as needed for pain.   Yes [provider]  amLODipine (NORVASC) 10 MG tablet Take 10 mg by mouth daily.   Yes [provider]  calcitonin, salmon, (MIACALCIN/FORTICAL) 200 UNIT/ACT nasal spray Place 1 spray into alternate nostrils daily.   Yes [provider]  Calcium Carb-Cholecalciferol 600-800 MG-UNIT TABS Take 1 tablet by mouth daily.   Yes [provider]  cholecalciferol (VITAMIN D) 25 MCG (1000 UNIT) tablet Take 1,000 Units by mouth daily.   Yes [provider]  esomeprazole (NEXIUM) 20 MG capsule Take 20 mg by  mouth every morning. 05/18/21 05/18/22 Yes [provider]  levothyroxine (SYNTHROID) 50 MCG tablet Take 50 mcg by mouth daily before breakfast.  07/09/16  Yes [provider]  lidocaine (LIDODERM) 5 % Place 1 patch onto the skin daily. Remove & Discard patch within 12 hours or as directed by MD 11/06/21  Yes Nolberto Hanlon, MD  loratadine (CLARITIN) 10 MG tablet Take 1 tablet (10 mg total) by mouth daily. 11/07/21  Yes Nolberto Hanlon, MD  magnesium oxide (MAG-OX) 400 MG tablet Take 400 mg by mouth daily.   Yes [provider]  predniSONE  (DELTASONE) 2.5 MG tablet Take 7 tablets (17.5 mg total) by mouth daily with breakfast. 11/07/21  Yes Nolberto Hanlon, MD  pregabalin (LYRICA) 50 MG capsule Take 50 mg by mouth 2 (two) times daily.   Yes [provider]  calcium carbonate (TUMS EX) 750 MG chewable tablet Chew 2 tablets by mouth daily as needed for heartburn.    [provider]  feeding supplement (ENSURE ENLIVE / ENSURE PLUS) LIQD Take 237 mLs by mouth 3 (three) times daily between meals. 11/06/21   Nolberto Hanlon, MD  meclizine (ANTIVERT) 25 MG tablet Take 1 tablet (25 mg total) by mouth 3 (three) times daily. 11/06/21   Nolberto Hanlon, MD    Physical Exam: Vitals:   11/30/21 1330 11/30/21 1342 11/30/21 1432 11/30/21 1504  BP: 126/85 126/85 134/69 138/73  Pulse: (!) 103 99 97 (!) 105  Resp: '17 15 16 20  '$ Temp:    98.4 F (36.9 C)  TempSrc:    Oral  SpO2: 94% 96% 94% 95%  Weight:      Height:        Constitutional: No acute distress, chronically ill appearing Head: Atraumatic Eyes: Conjunctiva clear ENM: Moist mucous membranes. Normal dentition.  Neck: Supple Respiratory: rales at bases Cardiovascular: Regular rate and rhythm. No murmurs/rubs/gallops. Abdomen: Non-tender, obese No masses. No rebound or guarding. Positive bowel sounds. Musculoskeletal: No joint deformity upper and lower extremities. Normal ROM, no contractures. Slightly decreased tone throughout Skin: No rashes, lesions, or ulcers.  Extremities: No peripheral edema. Palpable peripheral pulses. Neurologic: Alert, moving all 4 extremities. Symmetric lower extremity strength Psychiatric: Normal insight and judgement.   Labs on Admission: I have personally reviewed following labs and imaging studies  CBC: Recent Labs  Lab 11/30/21 1035  WBC 18.9*  NEUTROABS 17.1*  HGB 11.8*  HCT 37.6  MCV 95.4  PLT 803   Basic Metabolic Panel: Recent Labs  Lab 11/30/21 1035  NA 137  K 4.1  CL 102  CO2 23  GLUCOSE 100*  BUN 31*  CREATININE  1.57*  CALCIUM 9.4   GFR: Estimated Creatinine Clearance: 20.9 mL/min (A) (by C-G formula based on SCr of 1.57 mg/dL (H)). Liver Function Tests: Recent Labs  Lab 11/30/21 1035  AST 16  ALT 15  ALKPHOS 100  BILITOT 1.0  PROT 7.1  ALBUMIN 3.7   No results for input(s): "LIPASE", "AMYLASE" in the last 168 hours. No results for input(s): "AMMONIA" in the last 168 hours. Coagulation Profile: Recent Labs  Lab 11/30/21 1035  INR 1.0   Cardiac Enzymes: No results for input(s): "CKTOTAL", "CKMB", "CKMBINDEX", "TROPONINI" in the last 168 hours. BNP (last 3 results) No results for input(s): "PROBNP" in the last 8760 hours. HbA1C: No results for input(s): "HGBA1C" in the last 72 hours. CBG: No results for input(s): "GLUCAP" in the last 168 hours. Lipid Profile: No results for input(s): "CHOL", "HDL", "LDLCALC", "TRIG", "  CHOLHDL", "LDLDIRECT" in the last 72 hours. Thyroid Function Tests: No results for input(s): "TSH", "T4TOTAL", "FREET4", "T3FREE", "THYROIDAB" in the last 72 hours. Anemia Panel: No results for input(s): "VITAMINB12", "FOLATE", "FERRITIN", "TIBC", "IRON", "RETICCTPCT" in the last 72 hours. Urine analysis:    Component Value Date/Time   COLORURINE STRAW (A) 11/30/2021 1037   APPEARANCEUR CLEAR (A) 11/30/2021 1037   LABSPEC 1.015 11/30/2021 1037   PHURINE 7.0 11/30/2021 1037   GLUCOSEU NEGATIVE 11/30/2021 1037   HGBUR NEGATIVE 11/30/2021 1037   BILIRUBINUR NEGATIVE 11/30/2021 1037   KETONESUR NEGATIVE 11/30/2021 1037   PROTEINUR NEGATIVE 11/30/2021 1037   NITRITE NEGATIVE 11/30/2021 1037   LEUKOCYTESUR NEGATIVE 11/30/2021 1037    Radiological Exams on Admission: CT Angio Chest PE W and/or Wo Contrast  Result Date: 11/30/2021 CLINICAL DATA:  PE suspected, left leg weakness, low back pain, abnormal EKG EXAM: CT ANGIOGRAPHY CHEST WITH CONTRAST TECHNIQUE: Multidetector CT imaging of the chest was performed using the standard protocol during bolus administration  of intravenous contrast. Multiplanar CT image reconstructions and MIPs were obtained to evaluate the vascular anatomy. RADIATION DOSE REDUCTION: This exam was performed according to the departmental dose-optimization program which includes automated exposure control, adjustment of the mA and/or kV according to patient size and/or use of iterative reconstruction technique. CONTRAST:  30m OMNIPAQUE IOHEXOL 350 MG/ML SOLN COMPARISON:  None Available. FINDINGS: Cardiovascular: Satisfactory opacification of the pulmonary arteries to the segmental level. No evidence of pulmonary embolism. Cardiomegaly. Three-vessel coronary artery calcifications. No pericardial effusion. Aortic atherosclerosis. Mediastinum/Nodes: No enlarged mediastinal, hilar, or axillary lymph nodes. Moderate hiatal hernia with intrathoracic position of the gastric fundus. Hernia. Thyroid gland, trachea, and esophagus demonstrate no significant findings. Lungs/Pleura: Moderate emphysema. Diffuse bilateral bronchial wall thickening. Extensive dependent bibasilar atelectasis or consolidation. Upper Abdomen: No acute abnormality. Musculoskeletal: No chest wall abnormality. No acute osseous findings. Review of the MIP images confirms the above findings. IMPRESSION: 1. Negative examination for pulmonary embolism. 2. Extensive dependent bibasilar atelectasis or consolidation, concerning for infection or aspiration. 3. Emphysema. 4. Coronary artery disease. 5. Hiatal hernia. Aortic Atherosclerosis (ICD10-I70.0) and Emphysema (ICD10-J43.9). Electronically Signed   By: ADelanna AhmadiM.D.   On: 11/30/2021 14:55   CT ABDOMEN PELVIS W CONTRAST  Result Date: 11/30/2021 CLINICAL DATA:  Acute abdominal pain, nonlocalized. Left leg weakness and low back pain. Chronic back issues. EXAM: CT ABDOMEN AND PELVIS WITH CONTRAST TECHNIQUE: Multidetector CT imaging of the abdomen and pelvis was performed using the standard protocol following bolus administration of  intravenous contrast. RADIATION DOSE REDUCTION: This exam was performed according to the departmental dose-optimization program which includes automated exposure control, adjustment of the mA and/or kV according to patient size and/or use of iterative reconstruction technique. CONTRAST:  780mOMNIPAQUE IOHEXOL 350 MG/ML SOLN COMPARISON:  Abdominopelvic CT 11/21/2018. FINDINGS: Lower chest: Chest CT findings are dictated separately. There is dense consolidation at both lung bases and a moderate-sized hiatal hernia. Aortic and coronary artery atherosclerosis noted. Hepatobiliary: The liver is normal in density without suspicious focal abnormality. No evidence of gallstones, gallbladder wall thickening or biliary dilatation. Pancreas: Unremarkable. No pancreatic ductal dilatation or surrounding inflammatory changes. Spleen: Normal in size without focal abnormality. Adrenals/Urinary Tract: Both adrenal glands appear normal. Interval markedly progressive left renal cortical thinning and scarring with decreased enhancement and no definite contrast excretion. No significant right renal findings. No evidence of urinary tract calculus or hydronephrosis. The bladder is mildly distended without focal abnormality. Stomach/Bowel: As above, moderate-size hiatal hernia. The remainder of the  stomach appears unremarkable for its degree of distention. No small bowel distension, wall thickening or surrounding inflammation. The appendix is not visualized, by report surgically absent. There is a moderate to large amount of high density stool throughout the colon without evidence of colonic wall thickening or surrounding inflammation. There are diverticular changes in the descending and sigmoid colon. Vascular/Lymphatic: Aortic and branch vessel atherosclerosis without aneurysm or large vessel occlusion. No enlarged abdominopelvic lymph nodes. Reproductive: Hysterectomy.  No adnexal mass. Other: No evidence of abdominal wall mass or  hernia. No ascites. Musculoskeletal: Lumbar spine details are dictated separately. There are acute/progressive compression fractures at T12 and L1. There are healing fractures of the sacral ala bilaterally as well as the right superior and inferior pubic rami. There are healing posterior rib fractures on the right. IMPRESSION: 1. No acute abdominopelvic findings identified. 2. Dense consolidation in both lung bases. See separate chest CT report. 3. New compression fractures at T12 and L1. Lumbar spine details are dictated separately. Healing fractures of the sacral ala bilaterally as well as the right superior and inferior pubic rami. 4.  Aortic Atherosclerosis (ICD10-I70.0). Electronically Signed   By: Richardean Sale M.D.   On: 11/30/2021 14:55   CT L-SPINE NO CHARGE  Result Date: 11/30/2021 CLINICAL DATA:  Left leg weakness and low back pain. Chronic back issues. EXAM: CT LUMBAR SPINE WITHOUT CONTRAST TECHNIQUE: Multiplanar CT image reconstructions of the lumbar spine were generated from concurrent abdominopelvic CT dictated separately. RADIATION DOSE REDUCTION: This exam was performed according to the departmental dose-optimization program which includes automated exposure control, adjustment of the mA and/or kV according to patient size and/or use of iterative reconstruction technique. COMPARISON:  Lumbar spine radiographs 11/01/2021. Lumbar spine CT 10/15/2021. FINDINGS: Segmentation: Transitional lumbosacral anatomy. In keeping with the numbering previously applied, there is a transitional, partially sacralized L5 segment. Alignment: Stable and near anatomic. There is a mild convex left scoliosis. Vertebrae: Patient has undergone interval spinal augmentation at L2 since the prior CT. No significant progressive loss of vertebral body height at the superior endplate compression fracture of L2. There are new vertebral body fractures at T12 and L1. The T12 fracture involves the superior endplate and results  in approximately 20% loss of vertebral body height. The L1 fracture is biconcave, resulting in approximately 50% loss of vertebral body height. Both fractures are associated with mild osseous retropulsion. There are evolving bilateral chronic sacral insufficiency fractures. Paraspinal and other soft tissues: Mild paraspinous edema adjacent to the T12 and L1 fractures without significant epidural or paraspinal hematoma. See abdominopelvic CT. Disc levels: Chronic degenerative disc disease at L2-3, L3-4 and L4-5 with associated disc bulging, vacuum phenomenon and endplate osteophytes. There is mild spinal stenosis at L2-3 and L3-4. Moderate facet degenerative changes at L5-S1. Left-sided lumbosacral assimilation joint. The spondylosis appears similar to previous studies. IMPRESSION: 1. Since previous CT of 7 weeks ago, spinal augmentation has been performed at L2. 2. New L1 compression fracture from previous CT with approximately 50% loss of vertebral body height. This fracture appears progressive from the radiographs of 11/01/2021. 3. New T12 superior endplate compression fracture. 4. Mild paraspinous edema associated with these fractures. 5. Multilevel spondylosis, similar to prior imaging. 6. Full abdominopelvic CT findings dictated separately. Electronically Signed   By: Richardean Sale M.D.   On: 11/30/2021 14:53   CT HEAD WO CONTRAST (5MM)  Result Date: 11/30/2021 CLINICAL DATA:  Memory loss EXAM: CT HEAD WITHOUT CONTRAST TECHNIQUE: Contiguous axial images were obtained from the base  of the skull through the vertex without intravenous contrast. RADIATION DOSE REDUCTION: This exam was performed according to the departmental dose-optimization program which includes automated exposure control, adjustment of the mA and/or kV according to patient size and/or use of iterative reconstruction technique. COMPARISON:  No prior CT, correlation is made with MRI 05/24/2021 FINDINGS: Brain: No evidence of acute  infarction, hemorrhage, cerebral edema, mass, mass effect, or midline shift. No hydrocephalus or extra-axial fluid collection. Periventricular white matter changes, likely the sequela of chronic small vessel ischemic disease. Unchanged moderate cerebral volume loss. No disproportionate lobar atrophy. Vascular: No hyperdense vessel. Skull: Normal. Negative for fracture or focal lesion. Sinuses/Orbits: No acute finding. Status post bilateral lens replacements. Other: The mastoid air cells are well aerated. IMPRESSION: No acute intracranial process. Electronically Signed   By: Merilyn Baba M.D.   On: 11/30/2021 14:32   DG Chest 2 View  Result Date: 11/30/2021 CLINICAL DATA:  Chronic back pain. Hypoxemia. Cough. Suspected sepsis. EXAM: CHEST - 2 VIEW COMPARISON:  Radiograph 11/01/2021.  Abdominal CT 11/21/2018. FINDINGS: Progressively lower lung volumes. The heart is enlarged. There is aortic atherosclerosis. There are persistent bibasilar airspace opacities with a nodular component on the left, which appears more conspicuous than on the recent prior study. There may be a small amount of pleural fluid bilaterally. No evidence of pneumothorax. The bones appear unremarkable. IMPRESSION: Persistent bibasilar airspace opacities with a more conspicuous nodular component in the left lower lobe compared with prior examination from 1 month ago. Findings are suspicious for pneumonia. Underlying neoplasm not excluded. Recommend continued radiographic follow-up after appropriate therapy to document clearing. Given failure to resolve over the last month, consider chest CT now for further evaluation. Electronically Signed   By: Richardean Sale M.D.   On: 11/30/2021 11:41    EKG: Independently reviewed. sinus  Assessment/Plan Principal Problem:   CAP (community acquired pneumonia) Active Problems:   Compression fracture of first lumbar vertebra (HCC)   Acute respiratory failure with hypoxia (HCC)   Giant cell arteritis  (HCC)   Hypertension   Hypothyroidism   Hepatitis C   Compression fracture of body of thoracic vertebra (HCC)   # Acute hypoxic respiratory failure 2/2 atelectasis vs cap, stable on 2 L O2 - O2 via [place], wean as able  # CAP Vs atelectasis. Wbc elevated, afebrile, non-productive cough. Recent IV abx (last month). Covid neg - mrsa screen, urine antigens - continue cefepime - sputum for culture if able to produce - incentive spirometer - f/u procal - slp swallow eval  # Compression fracture Chronic problem, recent kyphoplasty L2, now worsening pain after fall and new compression fractures t12 and L5. [place] of neurosurg has reviewed films - IR consult for kyphoplasty, patient is interested - confirm patient has brace, should be wearing, neurosurg advises nothing further - PT/OT - palliative consult - cont calcitonin, lyrica, add oxy prn  # CKD 3b Cr 1.57 at baseline - monitor  # GCA Has been on steroid taper, will need to confirm whether still on oral prednisone (not on current med list)  # Hypothyroid - cont levothyroxine  # HTN Controlled - home amlodipine  DVT prophylaxis: lovenox Code Status: full  Family Communication: husband updated @ bedside  Consults called: IR   Level of care: Med-Surg Status is: Inpatient Remains inpatient appropriate because: unsafe d/c plan    Desma Maxim MD [facility] Pager [phone]  If 7PM-7AM, please contact night-coverage [email or web address] Password [email or web address]  11/30/2021, 4:30 PM

## 2021-12-01 DIAGNOSIS — Z66 Do not resuscitate: Secondary | ICD-10-CM

## 2021-12-01 DIAGNOSIS — Z515 Encounter for palliative care: Secondary | ICD-10-CM

## 2021-12-01 DIAGNOSIS — L8990 Pressure ulcer of unspecified site, unspecified stage: Secondary | ICD-10-CM | POA: Insufficient documentation

## 2021-12-01 DIAGNOSIS — S22000A Wedge compression fracture of unspecified thoracic vertebra, initial encounter for closed fracture: Secondary | ICD-10-CM

## 2021-12-01 DIAGNOSIS — S32020G Wedge compression fracture of second lumbar vertebra, subsequent encounter for fracture with delayed healing: Secondary | ICD-10-CM

## 2021-12-01 DIAGNOSIS — J690 Pneumonitis due to inhalation of food and vomit: Secondary | ICD-10-CM

## 2021-12-01 DIAGNOSIS — J9601 Acute respiratory failure with hypoxia: Secondary | ICD-10-CM

## 2021-12-01 DIAGNOSIS — M316 Other giant cell arteritis: Secondary | ICD-10-CM

## 2021-12-01 DIAGNOSIS — J189 Pneumonia, unspecified organism: Secondary | ICD-10-CM | POA: Diagnosis not present

## 2021-12-01 LAB — BASIC METABOLIC PANEL
Anion gap: 7 (ref 5–15)
BUN: 22 mg/dL (ref 8–23)
CO2: 23 mmol/L (ref 22–32)
Calcium: 8.6 mg/dL — ABNORMAL LOW (ref 8.9–10.3)
Chloride: 106 mmol/L (ref 98–111)
Creatinine, Ser: 1.22 mg/dL — ABNORMAL HIGH (ref 0.44–1.00)
GFR, Estimated: 43 mL/min — ABNORMAL LOW (ref >60)
Glucose, Bld: 76 mg/dL (ref 70–99)
Potassium: 3.6 mmol/L (ref 3.5–5.1)
Sodium: 136 mmol/L (ref 135–145)

## 2021-12-01 MED ORDER — OXYCODONE HCL 5 MG PO TABS
5.0000 mg | ORAL_TABLET | ORAL | Status: DC | PRN
  Administered 2021-12-01 – 2021-12-05 (×14): 5 mg via ORAL
  Filled 2021-12-01 (×14): qty 1

## 2021-12-01 MED ORDER — PREDNISONE 10 MG PO TABS
15.0000 mg | ORAL_TABLET | Freq: Every day | ORAL | Status: DC
  Administered 2021-12-02 – 2021-12-05 (×4): 15 mg via ORAL
  Filled 2021-12-01 (×4): qty 2

## 2021-12-01 MED ORDER — AMOXICILLIN-POT CLAVULANATE 500-125 MG PO TABS: 1.0000 | ORAL_TABLET | Freq: Two times a day (BID) | ORAL | Status: DC

## 2021-12-01 MED ORDER — AMOXICILLIN-POT CLAVULANATE 500-125 MG PO TABS
1.0000 | ORAL_TABLET | Freq: Two times a day (BID) | ORAL | Status: DC
  Administered 2021-12-02 – 2021-12-05 (×7): 500 mg via ORAL
  Filled 2021-12-01 (×8): qty 1

## 2021-12-01 MED ORDER — PREDNISONE 5 MG PO TABS
17.5000 mg | ORAL_TABLET | Freq: Every day | ORAL | Status: DC
Start: 2021-12-01 — End: 2021-12-01
  Filled 2021-12-01: qty 3.5

## 2021-12-01 NOTE — Evaluation (Signed)
Physical Therapy Evaluation Patient Details Name: Amy Hensley MRN: [id number] DOB: 1933/04/02 Today's Date: 12/01/2021  History of Present Illness  Patient is an 88 yo female that presented to ED for dyspnea, low back pain. Workup shows new T12-L5 after a fall. PMH of CKD, HTN, hypothyroidism, giant cell arteritis, chronic back pain, hep C. Recently underwent kyphoplasty of L2 as well as recent pnuemonia.   Clinical Impression  Patient alert, agreeable to PT with encouragement and premedicated prior to session. Cleared for OOB mobility without LSO per nuerosurgeon, family working on obtaining brace from rehab. Pt stated at rehab she was walking with a walker.   The patient was able to move extremities against gravity. Rolling minA and maxA to come up into sitting. Pt unable to maintain her seated balance despite repositioning, extra time, and BUE propping. MaxA due to posterior and L lean. Pt with noticeable SOB, spO2 94% on 2L and HR in 120s. Able to sit with assistance ~7 minutes. Returned to supine with maxAx2 and repositioned with all needs in reach.  Overall the patient demonstrated deficits (see "PT Problem List") that impede the patient's functional abilities, safety, and mobility and would benefit from skilled PT intervention. Recommendation is SNF due to current level of assistance needed.         Recommendations for follow up therapy are one component of a multi-disciplinary discharge planning process, led by the attending physician.  Recommendations may be updated based on patient status, additional functional criteria and insurance authorization.  Follow Up Recommendations Skilled nursing-short term rehab (<3 hours/day) Can patient physically be transported by private vehicle: No    Assistance Recommended at Discharge Frequent or constant Supervision/Assistance  Patient can return home with the following  Two people to help with walking and/or transfers;Two people to help with  bathing/dressing/bathroom;Direct supervision/assist for medications management;Help with stairs or ramp for entrance;Assist for transportation;Assistance with feeding;Assistance with cooking/housework;Direct supervision/assist for financial management    Equipment Recommendations None recommended by PT  Recommendations for Other Services       Functional Status Assessment Patient has had a recent decline in their functional status and demonstrates the ability to make significant improvements in function in a reasonable and predictable amount of time.     Precautions / Restrictions Precautions Precautions: Fall;Back Precaution Booklet Issued: No Required Braces or Orthoses:  (LSO) Restrictions Weight Bearing Restrictions: No      Mobility  Bed Mobility Overal bed mobility: Needs Assistance Bed Mobility: Rolling, Sidelying to Sit, Sit to Sidelying Rolling: Min assist Sidelying to sit: Max assist     Sit to sidelying: Max assist, +2 for physical assistance, +2 for safety/equipment      Transfers                   General transfer comment: unable to attempt, pt cannot maintain sitting balance, reported significant pain    Ambulation/Gait                  Stairs            Wheelchair Mobility    Modified Rankin (Stroke Patients Only)       Balance Overall balance assessment: Needs assistance Sitting-balance support: Feet supported, Bilateral upper extremity supported Sitting balance-Leahy Scale: Zero Sitting balance - Comments: instancse of poor  but mostly reliant on maxA to maintain sitting despite repositioning and UE support Postural control: Posterior lean, Left lateral lean  Pertinent Vitals/Pain Pain Assessment Pain Assessment: 0-10 Pain Score: 9  Pain Location: back pain, midline Pain Descriptors / Indicators: Grimacing, Aching, Moaning Pain Intervention(s): Limited activity within  patient's tolerance, Monitored during session, Repositioned, Premedicated before session    [place] expects to be discharged to:: Private residence Living Arrangements: Spouse/significant other Available Help at Discharge: Family;Available PRN/intermittently Type of Home: House Home Access: Stairs to enter Entrance Stairs-Rails: Chemical engineer of Steps: 3   Home Layout: One level Home Equipment: Air cabin crew (4 wheels);Cane - single point;Grab bars - tub/shower;Rolling Walker (2 wheels) Additional Comments: was at SNF prior to admission    Prior Function               Mobility Comments: ambulatory with RW with rehab, short distance per pt ADLs Comments: assist for ADL/IADL PTA at rehab     Hand Dominance        Extremity/Trunk Assessment   Upper Extremity Assessment Upper Extremity Assessment: Generalized weakness    Lower Extremity Assessment Lower Extremity Assessment: Generalized weakness    Cervical / Trunk Assessment Cervical / Trunk Assessment: Kyphotic  Communication   Communication: No difficulties  Cognition Arousal/Alertness: Awake/alert Behavior During Therapy: WFL for tasks assessed/performed Overall Cognitive Status: Within Functional Limits for tasks assessed                                          General Comments      Exercises     Assessment/Plan    PT Assessment Patient needs continued PT services  PT Problem List Decreased strength;Decreased mobility;Decreased range of motion;Decreased activity tolerance;Decreased balance;Pain;Decreased knowledge of use of DME       PT Treatment Interventions DME instruction;Therapeutic exercise;Gait training;Balance training;Stair training;Neuromuscular re-education;Functional mobility training;Therapeutic activities;Patient/family education    PT Goals (Current goals can be found in the Care Plan section)  Acute Rehab PT  Goals Patient Stated Goal: to go back to rehab PT Goal Formulation: With patient Time For Goal Achievement: 12/15/21 Potential to Achieve Goals: Good    Frequency 7X/week     Co-evaluation               AM-PAC PT "6 Clicks" Mobility  Outcome Measure Help needed turning from your back to your side while in a flat bed without using bedrails?: A Lot Help needed moving from lying on your back to sitting on the side of a flat bed without using bedrails?: A Lot Help needed moving to and from a bed to a chair (including a wheelchair)?: Total Help needed standing up from a chair using your arms (e.g., wheelchair or bedside chair)?: Total Help needed to walk in hospital room?: Total Help needed climbing 3-5 steps with a railing? : Total 6 Click Score: 8    End of Session   Activity Tolerance: Patient limited by fatigue;Patient limited by pain Patient left: in bed;with bed alarm set;with call bell/phone within reach Nurse Communication: Mobility status PT Visit Diagnosis: Other abnormalities of gait and mobility (R26.89);Difficulty in walking, not elsewhere classified (R26.2);Muscle weakness (generalized) (M62.81);Pain Pain - Right/Left:  (midline) Pain - part of body:  (back pain)    Time: 1607-3710 PT Time Calculation (min) (ACUTE ONLY): 21 min   Charges:   PT Evaluation $PT Eval Low Complexity: 1 Low PT Treatments $Therapeutic Activity: 8-22 mins        Lieutenant Diego PT, DPT 12:21  PM,12/01/21

## 2021-12-01 NOTE — Evaluation (Signed)
Occupational Therapy Evaluation Patient Details Name: Amy Hensley MRN: [id number] DOB: March 08, 1933 Today's Date: 12/01/2021   History of Present Illness Patient is an 88 yo female that presented to ED for dyspnea, low back pain. Workup shows new T12-L5 after a fall. PMH of CKD, HTN, hypothyroidism, giant cell arteritis, chronic back pain, hep C. Recently underwent kyphoplasty of L2 as well as pnuemonia.   Clinical Impression   [name] presents with generalized weakness, limited endurance, lethargy, impaired balance, dyspnea, and pain. She comes [facility] from [facility], where she has been residing since undergoing kyphoplasty in early June 2023. During today's evaluation, pt is significantly limited by pain. She requires increased time for rolling in bed and for repositioning LE at bed level. When she makes any movement of her trunk to come into sitting position, she cries out and moans, requires Max A for trunk support, is unable to maintain sitting balance EOB without ongoing Max A. Pt also SOB, with SpO2 at 90% supine in bed on 3.5 L, dropping to mid 80s with attempts at movement, and with notable cough and wheezing throughout session. Requires Max A + increased time for return to supine. Pt reports she has been pleased with the care she's received at [facility] and is prepared to DC back there. Given pt's rapid decline in past several weeks (as reported by pt and spouse), very limited fxl mobility, and significant pain mgmt needs, this pt could benefit from palliative care/hospice consult. Depending on projected course of pneumonia, as well as ability to address pain, pt may be a better candidate for DC to [place] than to SNF, if pt and husband are interested in such an option.    Recommendations for follow up therapy are one component of a multi-disciplinary discharge planning process, led by the attending physician.  Recommendations may be updated based on patient status, additional functional  criteria and insurance authorization.   Follow Up Recommendations  Skilled nursing-short term rehab (<3 hours/day)    Assistance Recommended at Discharge    Patient can return home with the following Two people to help with walking and/or transfers;Two people to help with bathing/dressing/bathroom;Direct supervision/assist for medications management;Help with stairs or ramp for entrance;Assistance with cooking/housework    Functional Status Assessment  Patient has had a recent decline in their functional status and demonstrates the ability to make significant improvements in function in a reasonable and predictable amount of time.  Equipment Recommendations  None recommended by OT    Recommendations for Other Services Other (comment) (palliative care/hospice)     Precautions / Restrictions Precautions Precautions: Fall;Back Precaution Booklet Issued: No Required Braces or Orthoses: Spinal Brace Restrictions Weight Bearing Restrictions: No      Mobility Bed Mobility Overal bed mobility: Needs Assistance Bed Mobility: Rolling, Sidelying to Sit, Sit to Sidelying Rolling: Mod assist Sidelying to sit: Max assist     Sit to sidelying: Max assist General bed mobility comments: pain increases from 4/10 in supine to 8/10 with bed mobility    Transfers                   General transfer comment: unable to attempt, pt cannot maintain sitting balance, reported significant pain      Balance Overall balance assessment: Needs assistance Sitting-balance support: Feet supported, Bilateral upper extremity supported Sitting balance-Leahy Scale: Zero Sitting balance - Comments: pt unable to maintain sitting balance and moans with pain throughout Postural control: Right lateral lean   Standing balance-Leahy Scale:  Zero                             ADL either performed or assessed with clinical judgement   ADL Overall ADL's : Needs assistance/impaired                                        General ADL Comments: Anticipate Max A for OOB ADLs     Vision         Perception     Praxis      Pertinent Vitals/Pain Pain Assessment Pain Score: 8  Pain Location: back pain, midline Pain Descriptors / Indicators: Grimacing, Aching, Moaning Pain Intervention(s): Limited activity within patient's tolerance, Repositioned     Hand Dominance     Extremity/Trunk Assessment Upper Extremity Assessment Upper Extremity Assessment: Generalized weakness   Lower Extremity Assessment Lower Extremity Assessment: Generalized weakness   Cervical / Trunk Assessment Cervical / Trunk Assessment: Kyphotic   Communication Communication Communication: No difficulties   Cognition Arousal/Alertness: Awake/alert Behavior During Therapy: WFL for tasks assessed/performed, Flat affect Overall Cognitive Status: Within Functional Limits for tasks assessed                                       General Comments       Exercises Other Exercises Other Exercises: Educ re: PoC, DC recs   Shoulder Instructions      Home Living Family/patient expects to be discharged to:: Private residence Living Arrangements: Spouse/significant other Available Help at Discharge: Family;Available PRN/intermittently Type of Home: House Home Access: Stairs to enter [employer] of Steps: 3 Entrance Stairs-Rails: Left;Right Home Layout: One level     Bathroom Shower/Tub: Teacher, early years/pre: Standard     Home Equipment: Air cabin crew (4 wheels);Cane - single point;Grab bars - tub/shower;Rolling Walker (2 wheels)   Additional Comments: was at SNF prior to admission      Prior Functioning/Environment Prior Level of Function : Needs assist             Mobility Comments: Pt reports she was walking "a little" at rehab; fall in shower ~ 2 wks ago ADLs Comments: assist for ADL/IADL at rehab        OT Problem  List: Decreased strength;Decreased range of motion;Decreased activity tolerance;Pain;Impaired balance (sitting and/or standing)      OT Treatment/Interventions: Self-care/ADL training;Therapeutic exercise;Patient/family education;Balance training;Energy conservation;Therapeutic activities    OT Goals(Current goals can be found in the care plan section) Acute Rehab OT Goals Patient Stated Goal: to feel better OT Goal Formulation: With patient Time For Goal Achievement: 12/15/21 Potential to Achieve Goals: Good ADL Goals Pt Will Perform Grooming: with set-up;with supervision;bed level Pt/caregiver will Perform Home Exercise Program: With Supervision;Increased ROM;Increased strength (for increased strength, ROM, and pain mgmt/comfort) Additional ADL Goal #1: Pt will perform bed mobility with Min A and with pain at 4/10 or lower  OT Frequency: Min 2X/week    Co-evaluation              AM-PAC OT "6 Clicks" Daily Activity     Outcome Measure Help from another person eating meals?: A Little Help from another person taking care of personal grooming?: A Little Help from another person toileting, which includes using toliet, bedpan, or  urinal?: A Lot Help from another person bathing (including washing, rinsing, drying)?: A Lot Help from another person to put on and taking off regular upper body clothing?: A Lot Help from another person to put on and taking off regular lower body clothing?: A Lot 6 Click Score: 14   End of Session    Activity Tolerance: Patient limited by pain Patient left: in bed;with family/visitor present;with call bell/phone within reach;with bed alarm set  OT Visit Diagnosis: Muscle weakness (generalized) (M62.81);History of falling (Z91.81);Pain                Time: 9480-1655 OT Time Calculation (min): 12 min Charges:  OT General Charges $OT Visit: 1 Visit OT Evaluation $OT Eval Low Complexity: 1 Low OT Treatments $Self Care/Home Management : 8-22  mins Josiah Lobo, PhD, MS, OTR/L 12/01/21, 2:17 PM

## 2021-12-01 NOTE — TOC Initial Note (Addendum)
Transition of Care [facility]) - Initial/Assessment Note    Patient Details  Name: Amy Hensley MRN: [id number] Date of Birth: 1933/01/01  [facility]) CM/SW Contact:    Magnus Ivan, LCSW Phone Number: 12/01/2021, 9:18 AM  Clinical Narrative:                 Per chart review, patient not fully oriented. CSW left VM for patient's spouse requesting a return call. CSW spoke with Tammy at [facility] who stated patient was there for short term rehab and will need a new PT eval and insurance auth in order to return. MD notified.   Per PT patient needs her brace which is at [facility]. CSW asked Tammy at [facility] if someone could bring it to the hospital per MD request.   9:35- Call from patient's spouse who stated their plan is for patient to return to [facility] for more STR. He stated it would be easier if [facility] brings the brace, but he may be able to go get it if not.   10:30- Per Tammy at [facility], they are having family pick up patient's brace and bring it to the hospital.   11:21- PT rec SNF. Called [facility] and spoke to [place] to start SNF and [facility] auth. She is aware per MD patient is medically ready.   2:54- Outpatient palliative referral made to [facility] with [facility].  Expected Discharge Plan: Skilled Nursing Facility Barriers to Discharge: Continued Medical Work up   Patient Goals and CMS Choice        Expected Discharge Plan and Services Expected Discharge Plan: [place]       Living arrangements for the past 2 months: Single Family Home                                      Prior Living Arrangements/Services Living arrangements for the past 2 months: Single Family Home Lives with:: Spouse                   Activities of Daily Living Home Assistive Devices/Equipment: Wheelchair ADL Screening (condition at time of admission) Patient's cognitive ability adequate to safely complete daily activities?: Yes Is the patient deaf or have difficulty  hearing?: Yes Does the patient have difficulty seeing, even when wearing glasses/contacts?: No Does the patient have difficulty concentrating, remembering, or making decisions?: Yes Patient able to express need for assistance with ADLs?: Yes Does the patient have difficulty dressing or bathing?: Yes Independently performs ADLs?: No Communication: Independent Dressing (OT): Needs assistance Is this a change from baseline?: Pre-admission baseline Grooming: Needs assistance Is this a change from baseline?: Pre-admission baseline Feeding: Independent Bathing: Needs assistance Is this a change from baseline?: Pre-admission baseline Toileting: Needs assistance Is this a change from baseline?: Pre-admission baseline In/Out Bed: Needs assistance Is this a change from baseline?: Pre-admission baseline Walks in Home: Needs assistance Is this a change from baseline?: Pre-admission baseline Does the patient have difficulty walking or climbing stairs?: Yes Weakness of Legs: Both Weakness of Arms/Hands: Both  Permission Sought/Granted                  Emotional Assessment         Alcohol / Substance Use: Not Applicable Psych Involvement: No (comment)  Admission diagnosis:  CAP (community acquired pneumonia) [J18.9] Acute respiratory failure with hypoxia ([place]) [J96.01] Sepsis, due to unspecified organism, unspecified whether  acute organ dysfunction present [facility]) [A41.9] Patient Active Problem List   Diagnosis Date Noted   Pressure injury of skin 12/01/2021   CAP (community acquired pneumonia) 11/30/2021   Compression fracture of body of thoracic vertebra ([place]) 11/30/2021   Acute respiratory failure with hypoxemia ([place]) 11/02/2021   Acute respiratory failure with hypoxia ([place]) 11/01/2021   Elevated troponin 11/01/2021   Compression fracture of first lumbar vertebra ([place]) 10/16/2021   Lumbar radiculopathy 09/25/2021   Ambulatory dysfunction 09/25/2021   Acute renal failure  superimposed on stage 3b chronic kidney disease ([place]) 09/25/2021   History of fall 08/2021 with sacral insufficiency fractures 09/25/2021   Giant cell arteritis ([place]) 09/25/2021   Hypertension    Hypothyroidism    Hepatitis C    Leukocytosis    Hepatitis C antibody positive in blood 06/01/2021   PCP:  Adin Hector, MD Pharmacy:   [facility] [address], [place] - [place] [address] [place] [address] Phone: [phone] Fax: [place] [address] Phone: [phone] Fax: [phone]     Social Determinants of Health (SDOH) Interventions    Readmission Risk Interventions    12/01/2021    9:16 AM  Readmission Risk Prevention Plan  Transportation Screening Complete  PCP or Specialist Appt within 3-5 Days Complete  HRI or [place] Complete  Social Work Consult for [place] Planning/Counseling Complete  Palliative Care Screening Not Applicable  Medication Review Press photographer) Complete

## 2021-12-01 NOTE — Progress Notes (Addendum)
PROGRESS NOTE    HARBOR PASTER  TFT:[id number] DOB: February 25, 1933 DOA: 11/30/2021 PCP: Adin Hector, MD     Brief Narrative:  From admission h and p Amy Hensley is a 88 y.o. female with medical history significant for gca, hypothyroid, htn, ckd 3b, recent hospitalizations for compression fracture, cap, who presents with the above.   Admitted last month treated for cap, negative vq scan, discharged on O2. Earlier that month admitted for compression fracture treated with kyphoplasty of L2. She reports at rehab weaned off oxygen and improving in terms of pain but then a few weeks ago had a fall in the shower and since then has had worse low back pain. Also a cough for at least a few days, today found to be hypoxic at rehab. Cough not productive. No chest pain or fevers no dysuria.    Assessment & Plan:   Principal Problem:   CAP (community acquired pneumonia) Active Problems:   Compression fracture of first lumbar vertebra (HCC)   Acute respiratory failure with hypoxia ([place])   Giant cell arteritis ([place])   Hypertension   Hypothyroidism   Hepatitis C   Compression fracture of body of thoracic vertebra (HCC)   Pressure injury of skin   # Acute hypoxic respiratory failure 2/2 atelectasis vs cap, stable on 2 L O2 - O2 via [place], wean as able   # CAP Vs atelectasis. Wbc elevated, afebrile, non-productive cough. Recent IV abx (last month). Covid neg. Aspiration risk - mrsa screen neg, strep neg, legionella pending, sputum culture pending - stop cefepime, start augmentin, will plan on tx through 7/27 - incentive spirometer - f/u procal  # Dysphagia Evaluated by SLP - dysphagia 2 diet   # Compression fracture Chronic problem, recent kyphoplasty L2, now worsening pain after fall and new compression fractures t12 and L5. Izora Ribas of neurosurg has reviewed films - IR consult for kyphoplasty, plan is to perform as outpt (dr. Jeri Modena) - family to bring in brace - PT/OT -  palliative consulted - cont calcitonin, lyrica, add oxy prn   # CKD 3b Stable, at baseline   # GCA - cont home prednisone, per husband should start 15 mg daily tomorrow, tapering per rheum   # Hypothyroid - cont levothyroxine   # HTN Controlled - home amlodipine   DVT prophylaxis: lovenox Code Status: dnr Family Communication: husband updated @ bedside 7/21  Level of care: Med-Surg Status is: Inpatient Remains inpatient appropriate because: unsafe d/c plan    Consultants:  palliative  Procedures: none  Antimicrobials:  Cefepime>augmentin    Subjective: Breathing somewhat improved, back pain somewhat improved  Objective: Vitals:   11/30/21 1755 11/30/21 2110 12/01/21 0550 12/01/21 0806  BP: (!) 157/77 139/77 (!) 147/71 (!) 142/75  Pulse: (!) 110 (!) 106 (!) 108 (!) 104  Resp: '20 18 18 18  '$ Temp: 98.5 F (36.9 C) 97.6 F (36.4 C) 98.8 F (37.1 C) 98.7 F (37.1 C)  TempSrc: Oral Oral    SpO2: 95% 94% 90% 92%  Weight:      Height:        Intake/Output Summary (Last 24 hours) at 12/01/2021 1256 Last data filed at 12/01/2021 0900 Gross per 24 hour  Intake 240 ml  Output 1700 ml  Net -1460 ml   Filed Weights   11/30/21 1028  Weight: 62.6 kg    Examination:  Constitutional: No acute distress, chronically ill appearing Head: Atraumatic Eyes: Conjunctiva clear Respiratory: rales at bases Cardiovascular: Regular rate  and rhythm. No murmurs/rubs/gallops. Abdomen: Non-tender, obese No masses. No rebound or guarding. Positive bowel sounds. Musculoskeletal: No joint deformity upper and lower extremities. Normal ROM, no contractures. Slightly decreased tone throughout Skin: No rashes, lesions, or ulcers.  Extremities: No peripheral edema. Palpable peripheral pulses. Neurologic: Alert, moving all 4 extremities. Symmetric lower extremity strength Psychiatric: Normal insight and judgement.    Data Reviewed: I have personally reviewed following labs and  imaging studies  CBC: Recent Labs  Lab 11/30/21 1035  WBC 18.9*  NEUTROABS 17.1*  HGB 11.8*  HCT 37.6  MCV 95.4  PLT 488   Basic Metabolic Panel: Recent Labs  Lab 11/30/21 1035 12/01/21 0458  NA 137 136  K 4.1 3.6  CL 102 106  CO2 23 23  GLUCOSE 100* 76  BUN 31* 22  CREATININE 1.57* 1.22*  CALCIUM 9.4 8.6*   GFR: Estimated Creatinine Clearance: 26.9 mL/min (A) (by C-G formula based on SCr of 1.22 mg/dL (H)). Liver Function Tests: Recent Labs  Lab 11/30/21 1035  AST 16  ALT 15  ALKPHOS 100  BILITOT 1.0  PROT 7.1  ALBUMIN 3.7   No results for input(s): "LIPASE", "AMYLASE" in the last 168 hours. No results for input(s): "AMMONIA" in the last 168 hours. Coagulation Profile: Recent Labs  Lab 11/30/21 1035  INR 1.0   Cardiac Enzymes: No results for input(s): "CKTOTAL", "CKMB", "CKMBINDEX", "TROPONINI" in the last 168 hours. BNP (last 3 results) No results for input(s): "PROBNP" in the last 8760 hours. HbA1C: No results for input(s): "HGBA1C" in the last 72 hours. CBG: No results for input(s): "GLUCAP" in the last 168 hours. Lipid Profile: No results for input(s): "CHOL", "HDL", "LDLCALC", "TRIG", "CHOLHDL", "LDLDIRECT" in the last 72 hours. Thyroid Function Tests: No results for input(s): "TSH", "T4TOTAL", "FREET4", "T3FREE", "THYROIDAB" in the last 72 hours. Anemia Panel: No results for input(s): "VITAMINB12", "FOLATE", "FERRITIN", "TIBC", "IRON", "RETICCTPCT" in the last 72 hours. Urine analysis:    Component Value Date/Time   COLORURINE STRAW (A) 11/30/2021 1037   APPEARANCEUR CLEAR (A) 11/30/2021 1037   LABSPEC 1.015 11/30/2021 1037   PHURINE 7.0 11/30/2021 1037   GLUCOSEU NEGATIVE 11/30/2021 1037   HGBUR NEGATIVE 11/30/2021 1037   BILIRUBINUR NEGATIVE 11/30/2021 1037   KETONESUR NEGATIVE 11/30/2021 1037   PROTEINUR NEGATIVE 11/30/2021 1037   NITRITE NEGATIVE 11/30/2021 1037   LEUKOCYTESUR NEGATIVE 11/30/2021 1037   Sepsis  Labs: '@LABRCNTIP'$ (procalcitonin:4,lacticidven:4)  ) Recent Results (from the past 240 hour(s))  SARS Coronavirus 2 by RT PCR (hospital order, performed in [facility] hospital lab) *cepheid single result test* Anterior Nasal Swab     Status: None   Collection Time: 11/30/21 10:34 AM   Specimen: Anterior Nasal Swab  Result Value Ref Range Status   SARS Coronavirus 2 by RT PCR NEGATIVE NEGATIVE Final    Comment: (NOTE) SARS-CoV-2 target nucleic acids are NOT DETECTED.  The SARS-CoV-2 RNA is generally detectable in upper and lower respiratory specimens during the acute phase of infection. The lowest concentration of SARS-CoV-2 viral copies this assay can detect is 250 copies / mL. A negative result does not preclude SARS-CoV-2 infection and should not be used as the sole basis for treatment or other patient management decisions.  A negative result may occur with improper specimen collection / handling, submission of specimen other than nasopharyngeal swab, presence of viral mutation(s) within the areas targeted by this assay, and inadequate number of viral copies (<250 copies / mL). A negative result must be combined with clinical observations,  patient history, and epidemiological information.  Fact Sheet for Patients:   https://www.patel.info/  Fact Sheet for Healthcare Providers: https://hall.com/  This test is not yet approved or  cleared by the [place] FDA and has been authorized for detection and/or diagnosis of SARS-CoV-2 by FDA under an Emergency Use Authorization (EUA).  This EUA will remain in effect (meaning this test can be used) for the duration of the COVID-19 declaration under Section 564(b)(1) of the Act, 21 U.S.C. section 360bbb-3(b)(1), unless the authorization is terminated or revoked sooner.  Performed at [facility], [address] [place], [place] [address]   Culture, blood (Routine x 2)     Status:  None (Preliminary result)   Collection Time: 11/30/21 10:35 AM   Specimen: BLOOD  Result Value Ref Range Status   Specimen Description BLOOD RIGHT [facility]  Final   Special Requests   Final    BOTTLES DRAWN AEROBIC AND ANAEROBIC Blood Culture adequate volume   Culture   Final    NO GROWTH < 24 HOURS Performed at [facility], [address] [place], [place] [address]    Report Status PENDING  Incomplete  Culture, blood (Routine x 2)     Status: None (Preliminary result)   Collection Time: 11/30/21  2:52 PM   Specimen: BLOOD  Result Value Ref Range Status   Specimen Description BLOOD LEFT ANTECUBITAL  Final   Special Requests   Final    BOTTLES DRAWN AEROBIC AND ANAEROBIC Blood Culture results may not be optimal due to an excessive volume of blood received in culture bottles   Culture   Final    NO GROWTH < 24 HOURS Performed at [facility], [address] [place], [place] [address]    Report Status PENDING  Incomplete  Expectorated Sputum Assessment w Gram Stain, Rflx to Resp Cult     Status: None   Collection Time: 11/30/21  6:35 PM   Specimen: Stump; Sputum  Result Value Ref Range Status   Specimen Description STUMP  Final   Special Requests NONE  Final   Sputum evaluation   Final    THIS SPECIMEN IS ACCEPTABLE FOR SPUTUM CULTURE Performed at [facility], [address], [place], [place] [address]    Report Status 11/30/2021 FINAL  Final  Culture, Respiratory w Gram Stain     Status: None (Preliminary result)   Collection Time: 11/30/21  6:35 PM   Specimen: Stump  Result Value Ref Range Status   Specimen Description   Final    STUMP Performed at [facility], [address] [place], [place] [address]    Special Requests   Final    NONE Reflexed from [id number] Performed at [facility], [address] [place], [place] [address]    Gram Stain   Final    ABUNDANT WBC PRESENT,BOTH PMN AND MONONUCLEAR ABUNDANT YEAST FEW GRAM  POSITIVE COCCI IN PAIRS AND CHAINS Performed at [facility], [place] [address] [place], [place] [address]    Culture PENDING  Incomplete   Report Status PENDING  Incomplete  Surgical pcr screen     Status: None   Collection Time: 11/30/21  6:40 PM   Specimen: Nasal Mucosa; Nasal Swab  Result Value Ref Range Status   MRSA, PCR NEGATIVE NEGATIVE Final   Staphylococcus aureus NEGATIVE NEGATIVE Final    Comment: (NOTE) The Xpert SA Assay (FDA approved for NASAL specimens in patients 57 years of age and older), is  one component of a comprehensive surveillance program. It is not intended to diagnose infection nor to guide or monitor treatment. Performed at [facility], [address], [place], [place] [address]          Radiology Studies: CT Angio Chest PE W and/or Wo Contrast  Result Date: 11/30/2021 CLINICAL DATA:  PE suspected, left leg weakness, low back pain, abnormal EKG EXAM: CT ANGIOGRAPHY CHEST WITH CONTRAST TECHNIQUE: Multidetector CT imaging of the chest was performed using the standard protocol during bolus administration of intravenous contrast. Multiplanar CT image reconstructions and MIPs were obtained to evaluate the vascular anatomy. RADIATION DOSE REDUCTION: This exam was performed according to the departmental dose-optimization program which includes automated exposure control, adjustment of the mA and/or kV according to patient size and/or use of iterative reconstruction technique. CONTRAST:  10m OMNIPAQUE IOHEXOL 350 MG/ML SOLN COMPARISON:  None Available. FINDINGS: Cardiovascular: Satisfactory opacification of the pulmonary arteries to the segmental level. No evidence of pulmonary embolism. Cardiomegaly. Three-vessel coronary artery calcifications. No pericardial effusion. Aortic atherosclerosis. Mediastinum/Nodes: No enlarged mediastinal, hilar, or axillary lymph nodes. Moderate hiatal hernia with intrathoracic position of the gastric fundus. Hernia.  Thyroid gland, trachea, and esophagus demonstrate no significant findings. Lungs/Pleura: Moderate emphysema. Diffuse bilateral bronchial wall thickening. Extensive dependent bibasilar atelectasis or consolidation. Upper Abdomen: No acute abnormality. Musculoskeletal: No chest wall abnormality. No acute osseous findings. Review of the MIP images confirms the above findings. IMPRESSION: 1. Negative examination for pulmonary embolism. 2. Extensive dependent bibasilar atelectasis or consolidation, concerning for infection or aspiration. 3. Emphysema. 4. Coronary artery disease. 5. Hiatal hernia. Aortic Atherosclerosis (ICD10-I70.0) and Emphysema (ICD10-J43.9). Electronically Signed   By: ADelanna AhmadiM.D.   On: 11/30/2021 14:55   CT ABDOMEN PELVIS W CONTRAST  Result Date: 11/30/2021 CLINICAL DATA:  Acute abdominal pain, nonlocalized. Left leg weakness and low back pain. Chronic back issues. EXAM: CT ABDOMEN AND PELVIS WITH CONTRAST TECHNIQUE: Multidetector CT imaging of the abdomen and pelvis was performed using the standard protocol following bolus administration of intravenous contrast. RADIATION DOSE REDUCTION: This exam was performed according to the departmental dose-optimization program which includes automated exposure control, adjustment of the mA and/or kV according to patient size and/or use of iterative reconstruction technique. CONTRAST:  775mOMNIPAQUE IOHEXOL 350 MG/ML SOLN COMPARISON:  Abdominopelvic CT 11/21/2018. FINDINGS: Lower chest: Chest CT findings are dictated separately. There is dense consolidation at both lung bases and a moderate-sized hiatal hernia. Aortic and coronary artery atherosclerosis noted. Hepatobiliary: The liver is normal in density without suspicious focal abnormality. No evidence of gallstones, gallbladder wall thickening or biliary dilatation. Pancreas: Unremarkable. No pancreatic ductal dilatation or surrounding inflammatory changes. Spleen: Normal in size without focal  abnormality. Adrenals/Urinary Tract: Both adrenal glands appear normal. Interval markedly progressive left renal cortical thinning and scarring with decreased enhancement and no definite contrast excretion. No significant right renal findings. No evidence of urinary tract calculus or hydronephrosis. The bladder is mildly distended without focal abnormality. Stomach/Bowel: As above, moderate-size hiatal hernia. The remainder of the stomach appears unremarkable for its degree of distention. No small bowel distension, wall thickening or surrounding inflammation. The appendix is not visualized, by report surgically absent. There is a moderate to large amount of high density stool throughout the colon without evidence of colonic wall thickening or surrounding inflammation. There are diverticular changes in the descending and sigmoid colon. Vascular/Lymphatic: Aortic and branch vessel atherosclerosis without aneurysm or large vessel occlusion. No enlarged abdominopelvic lymph nodes. Reproductive: Hysterectomy.  No adnexal  mass. Other: No evidence of abdominal wall mass or hernia. No ascites. Musculoskeletal: Lumbar spine details are dictated separately. There are acute/progressive compression fractures at T12 and L1. There are healing fractures of the sacral ala bilaterally as well as the right superior and inferior pubic rami. There are healing posterior rib fractures on the right. IMPRESSION: 1. No acute abdominopelvic findings identified. 2. Dense consolidation in both lung bases. See separate chest CT report. 3. New compression fractures at T12 and L1. Lumbar spine details are dictated separately. Healing fractures of the sacral ala bilaterally as well as the right superior and inferior pubic rami. 4.  Aortic Atherosclerosis (ICD10-I70.0). Electronically Signed   By: Richardean Sale M.D.   On: 11/30/2021 14:55   CT L-SPINE NO CHARGE  Result Date: 11/30/2021 CLINICAL DATA:  Left leg weakness and low back pain.  Chronic back issues. EXAM: CT LUMBAR SPINE WITHOUT CONTRAST TECHNIQUE: Multiplanar CT image reconstructions of the lumbar spine were generated from concurrent abdominopelvic CT dictated separately. RADIATION DOSE REDUCTION: This exam was performed according to the departmental dose-optimization program which includes automated exposure control, adjustment of the mA and/or kV according to patient size and/or use of iterative reconstruction technique. COMPARISON:  Lumbar spine radiographs 11/01/2021. Lumbar spine CT 10/15/2021. FINDINGS: Segmentation: Transitional lumbosacral anatomy. In keeping with the numbering previously applied, there is a transitional, partially sacralized L5 segment. Alignment: Stable and near anatomic. There is a mild convex left scoliosis. Vertebrae: Patient has undergone interval spinal augmentation at L2 since the prior CT. No significant progressive loss of vertebral body height at the superior endplate compression fracture of L2. There are new vertebral body fractures at T12 and L1. The T12 fracture involves the superior endplate and results in approximately 20% loss of vertebral body height. The L1 fracture is biconcave, resulting in approximately 50% loss of vertebral body height. Both fractures are associated with mild osseous retropulsion. There are evolving bilateral chronic sacral insufficiency fractures. Paraspinal and other soft tissues: Mild paraspinous edema adjacent to the T12 and L1 fractures without significant epidural or paraspinal hematoma. See abdominopelvic CT. Disc levels: Chronic degenerative disc disease at L2-3, L3-4 and L4-5 with associated disc bulging, vacuum phenomenon and endplate osteophytes. There is mild spinal stenosis at L2-3 and L3-4. Moderate facet degenerative changes at L5-S1. Left-sided lumbosacral assimilation joint. The spondylosis appears similar to previous studies. IMPRESSION: 1. Since previous CT of 7 weeks ago, spinal augmentation has been  performed at L2. 2. New L1 compression fracture from previous CT with approximately 50% loss of vertebral body height. This fracture appears progressive from the radiographs of 11/01/2021. 3. New T12 superior endplate compression fracture. 4. Mild paraspinous edema associated with these fractures. 5. Multilevel spondylosis, similar to prior imaging. 6. Full abdominopelvic CT findings dictated separately. Electronically Signed   By: Richardean Sale M.D.   On: 11/30/2021 14:53   CT HEAD WO CONTRAST (5MM)  Result Date: 11/30/2021 CLINICAL DATA:  Memory loss EXAM: CT HEAD WITHOUT CONTRAST TECHNIQUE: Contiguous axial images were obtained from the base of the skull through the vertex without intravenous contrast. RADIATION DOSE REDUCTION: This exam was performed according to the departmental dose-optimization program which includes automated exposure control, adjustment of the mA and/or kV according to patient size and/or use of iterative reconstruction technique. COMPARISON:  No prior CT, correlation is made with MRI 05/24/2021 FINDINGS: Brain: No evidence of acute infarction, hemorrhage, cerebral edema, mass, mass effect, or midline shift. No hydrocephalus or extra-axial fluid collection. Periventricular white matter changes, likely  the sequela of chronic small vessel ischemic disease. Unchanged moderate cerebral volume loss. No disproportionate lobar atrophy. Vascular: No hyperdense vessel. Skull: Normal. Negative for fracture or focal lesion. Sinuses/Orbits: No acute finding. Status post bilateral lens replacements. Other: The mastoid air cells are well aerated. IMPRESSION: No acute intracranial process. Electronically Signed   By: Merilyn Baba M.D.   On: 11/30/2021 14:32   DG Chest 2 View  Result Date: 11/30/2021 CLINICAL DATA:  Chronic back pain. Hypoxemia. Cough. Suspected sepsis. EXAM: CHEST - 2 VIEW COMPARISON:  Radiograph 11/01/2021.  Abdominal CT 11/21/2018. FINDINGS: Progressively lower lung volumes.  The heart is enlarged. There is aortic atherosclerosis. There are persistent bibasilar airspace opacities with a nodular component on the left, which appears more conspicuous than on the recent prior study. There may be a small amount of pleural fluid bilaterally. No evidence of pneumothorax. The bones appear unremarkable. IMPRESSION: Persistent bibasilar airspace opacities with a more conspicuous nodular component in the left lower lobe compared with prior examination from 1 month ago. Findings are suspicious for pneumonia. Underlying neoplasm not excluded. Recommend continued radiographic follow-up after appropriate therapy to document clearing. Given failure to resolve over the last month, consider chest CT now for further evaluation. Electronically Signed   By: Richardean Sale M.D.   On: 11/30/2021 11:41        Scheduled Meds:  amLODipine  10 mg Oral Daily   calcitonin (salmon)  1 spray Alternating Nares Daily   calcium-vitamin D  1 tablet Oral Daily   enoxaparin (LOVENOX) injection  30 mg Subcutaneous Q24H   levothyroxine  50 mcg Oral QAC breakfast   pantoprazole  40 mg Oral Daily   polyethylene glycol  17 g Oral Daily   pregabalin  50 mg Oral BID   sodium chloride flush  3 mL Intravenous Q12H   Continuous Infusions:  sodium chloride     ceFEPime (MAXIPIME) IV 2 g (12/01/21 0940)     LOS: 1 day     Desma Maxim, MD [facility]   If 7PM-7AM, please contact night-coverage [email or web address] Password [facility] 12/01/2021, 12:56 PM

## 2021-12-01 NOTE — Consult Note (Signed)
Consultation Note Date: 12/01/2021   Patient Name: Amy Hensley  DOB: [birth year]  MRN: [id number]  Age / Sex: 88 y.o., female  PCP: Adin Hector, MD Referring Physician: Gwynne Edinger, MD  Reason for Consultation: Establishing goals of care  HPI/Patient Profile: 88 y.o. female  with past medical history of GCA, hypothyroidism, HTN, CKD (stage 3B), and a recent hospitalization fo ra compressions fracture with CAP admitted on 11/30/2021 with SOB and weakness from [facility]. Pt is being treated for new T12 and L5 compression fractures.  Plan is for outpatient kyphoplasty.  PMT was consulted to discuss [place].   Clinical Assessment and Goals of Care: I have reviewed medical records including EPIC notes, labs and imaging, assessed the patient and then met with patient and her husband at bedside  to discuss diagnosis prognosis, GOC, EOL wishes, disposition and options.  I introduced Palliative Medicine as specialized medical care for people living with serious illness. It focuses on providing relief from the symptoms and stress of a serious illness. The goal is to improve quality of life for both the patient and the family.  We discussed a brief life review of the patient. Patient and husband have been married for 10 years. 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  As far as functional and nutritional status pt has noted a significant decline since Dec 2022 when she began a prednisone taper for GCA. She endorses she has had significant difficult with back pain and a nagging cough for the last month. She says she has a healthy appetite.   We discussed patient's current illness and what it means in the larger context of patient's on-going co-morbidities. Reviewed need for pulmonary toileting and pain  management of low back issues. I attempted to elicit values and goals of care important to the patient.  Pt is looking forward to kyphoplasty since her last one provided such great relief. Discussed this will take place outpatient. She would like to be able to participate in rehab so she can return home with her husband. Plan is tentatively for pt to work with PT/OT in hospital and return to [facility].   Reviewed pain regimen and performed full pain assessment. OxyIR adjusted to home regimen of 43m PO Q4H PRN for pain.  Advance directives, concepts specific to code status, artificial feeding and hydration, and rehospitalization were considered and discussed. Pt and husband stated they did not want anything artifical or a mechanical ventilator in case of cardiopulmonary arrest. Pt verablized she would want to allow a natural peaceful death in this case. Patient's husband believes pt was already documented as a DNR/DNI. Code status changed to DNR/DNI.   Discussed with patient/family the importance of continued conversation with family and the medical providers regarding overall plan of care and treatment options, ensuring decisions are within the context of the patient's values and GOCs.    Palliative Care services outpatient were  explained and offered. Pt and husband were in agreement to referral. Referral placed for TOC.   Questions and concerns were addressed. The family was encouraged to call with questions or concerns.   PMT will continue to follow the patient throughout her hospitalization. There is no in person weekend coverage at [facility] for PMT. Please utilize [facility] for on-call providers. A PMT provider will be available in person on Monday.   Primary Decision Maker PATIENT  Code Status/Advance Care Planning: DNR  Prognosis:   Unable to determine  Discharge Planning: [place] for rehab with Palliative care service follow-up  Primary Diagnoses: Present on Admission:   Giant cell arteritis ([place])  Hypertension  Hypothyroidism  Hepatitis C  CAP (community acquired pneumonia)  Acute respiratory failure with hypoxia ([place])   Physical Exam Vitals reviewed.  Constitutional:      General: She is not in acute distress.    Appearance: Normal appearance. She is normal weight. She is not ill-appearing.  HENT:     Head: Normocephalic.     Mouth/Throat:     Mouth: Mucous membranes are moist.  Eyes:     Pupils: Pupils are equal, round, and reactive to light.  Cardiovascular:     Rate and Rhythm: Normal rate.     Pulses: Normal pulses.  Pulmonary:     Breath sounds: Rhonchi present.  Abdominal:     Palpations: Abdomen is soft.  Musculoskeletal:     Comments: Generalized weakness 6/10 low back pain with movement  Neurological:     Mental Status: She is alert and oriented to person, place, and time.  Psychiatric:        Mood and Affect: Mood normal.        Behavior: Behavior normal.        Thought Content: Thought content normal.        Judgment: Judgment normal.     Palliative Assessment/Data: 60%     I discussed this patient's plan of care with patient, patient's husband, RN.  Thank you for this consult. Palliative medicine will continue to follow and assist holistically.   Time Total: 75 minutes Greater than 50%  of this time was spent counseling and coordinating care related to the above assessment and plan.  Signed by: Jordan Hawks, DNP, FNP-BC Palliative Medicine    Please contact Palliative Medicine Team phone at [phone] for questions and concerns.  For individual provider: See Shea Evans

## 2021-12-01 NOTE — Progress Notes (Signed)
[facility] Liaison Note  Notified by TOC/Meagan of patient/family request of [facility] Paliative services.  [facility] hospital liaison will follow patient for discharge disposition.   Please call with any questions/concerns.    Thank you for the opportunity to participate in this patient's care.   Daphene Calamity, MSW [facility] Liaison  [phone]

## 2021-12-01 NOTE — Progress Notes (Signed)
IR received request to evaluate patient for T12/L1 kyphoplasty/vertebroplasty. Procedure approved by Dr. Denna Haggard. Patient with pending discharge this weekend. IR will plan for outpatient procedure. An order has been placed for outpatient KP/VP and a scheduler from our office will call her once insurance authorization has been obtained. The ordering MD is aware.   The inpatient order will be deleted.   Amy Hensley, [place] [phone] 12/01/2021, 10:34 AM

## 2021-12-01 NOTE — Evaluation (Signed)
Clinical/Bedside Swallow Evaluation Patient Details  Name: Amy Hensley MRN: [id number] Date of Birth: 12/01/1932  Today's Date: 12/01/2021 Time: SLP Start Time (ACUTE ONLY): 1045 SLP Stop Time (ACUTE ONLY): 1130 SLP Time Calculation (min) (ACUTE ONLY): 45 min  Past Medical History:  Past Medical History:  Diagnosis Date   Arthritis    Chronic kidney disease    stage 3   Hypertension    Hypothyroidism    Past Surgical History:  Past Surgical History:  Procedure Laterality Date   ABDOMINAL HYSTERECTOMY     ABDOMINAL SURGERY     tacked up uterus   APPENDECTOMY     ARTERY BIOPSY N/A 05/19/2021   Procedure: BIOPSY TEMPORAL ARTERY;  Surgeon: Herbert Pun, MD;  Location: [facility] ORS;  Service: General;  Laterality: N/A;   CATARACT EXTRACTION W/PHACO Left 10/04/2016   Procedure: CATARACT EXTRACTION PHACO AND INTRAOCULAR LENS PLACEMENT ([place]);  Surgeon: Eulogio Bear, MD;  Location: [facility] ORS;  Service: Ophthalmology;  Laterality: Left;  us02:17.8 ap%19.6 cde27.31 fluid lot # [id number]   CESAREAN SECTION     EYE SURGERY     INGUINAL HERNIA REPAIR Right 12/08/2018   Procedure: OPEN RIGHT HERNIA REPAIR INGUINAL ADULT;  Surgeon: Herbert Pun, MD;  Location: [facility] ORS;  Service: General;  Laterality: Right;   IR INJECT/THERA/INC NEEDLE/CATH/PLC EPI/LUMB/SAC W/IMG  09/26/2021   IR KYPHO LUMBAR INC FX REDUCE BONE BX UNI/BIL CANNULATION INC/IMAGING  10/18/2021   IR RADIOLOGIST EVAL & MGMT  11/09/2021   HPI:  Per admitting H&P "Amy Hensley is a 88 y.o. female with medical history significant for gca, hypothyroid, htn, ckd 3b, recent hospitalizations for compression fracture, cap, who presents with the above.     Admitted last month treated for cap, negative vq scan, discharged on O2. Earlier that month admitted for compression fracture treated with kyphoplasty of L2. She reports at rehab weaned off oxygen and improving in terms of pain but then a few weeks ago had a fall in the  shower and since then has had worse low back pain. Also a cough for at least a few days, today found to be hypoxic at rehab. Cough not productive. No chest pain or fevers no dysuria.    "    Assessment / Plan / Recommendation  Clinical Impression  Pt presents with mild dysphagia. Chest xray concerning for possible aspiration. Pt noted to have a cough consistently throughout assessment even prior to any PO's therefore it was difficult to determine if at all related to oral intake. Oral mech exam revealed structures to be functioning adequately with limited overall weakness. Pt has back pain and therefore did not want the bed raised to 90 degree level. She tolerated slow small bed movements well, with two pillow behind her head to reduce risk of aspiration. She tolerated thin liquids by cup, applesauce, and bites of graham crackers without immediate s/s of aspration. Delayed cough noted but again this was noted throughout the assessment even without PO's. Oral transit delay with solids needing extended time to chew and swallow. Pt reports her husband usually feeds her at meals while she has been here. Rec altering diet to Dys 2 chopped for ease of chewing. Aspiration precautions implemented and discussed with husband. In particular the need for upright positioning at meals, small single sips and allowing Pt to feed herself as much as possible. ST to follow up with toleration of diet and further assess with thin liquids. If Pt does not like the chopped 2, may  upgrade back to Dys 3. Prognosis good. SLP Visit Diagnosis: Dysphagia, oropharyngeal phase (R13.12)    Aspiration Risk  Mild aspiration risk    Diet Recommendation Dysphagia 2 (Fine chop)   Liquid Administration via: Straw;Other (Comment) (single sips only) Medication Administration: Whole meds with puree Supervision: Staff to assist with self feeding;Comment (or husband) Compensations: Minimize environmental distractions;Slow rate;Small  sips/bites Postural Changes: Seated upright at 90 degrees;Remain upright for at least 30 minutes after po intake (add two pillow behind neck if she cannot tolerate fully upright)    Other  Recommendations Oral Care Recommendations: Oral care BID    Recommendations for follow up therapy are one component of a multi-disciplinary discharge planning process, led by the attending physician.  Recommendations may be updated based on patient status, additional functional criteria and insurance authorization.  Follow up Recommendations Skilled nursing-short term rehab (<3 hours/day)      Assistance Recommended at Discharge  Plans for short term rehab  Functional Status Assessment    Frequency and Duration min 2x/week  1 week       Prognosis Prognosis for Safe Diet Advancement: Good      Swallow Study   General Date of Onset: 11/30/21 HPI: Per admitting H&P "Amy Hensley is a 88 y.o. female with medical history significant for gca, hypothyroid, htn, ckd 3b, recent hospitalizations for compression fracture, cap, who presents with the above.     Admitted last month treated for cap, negative vq scan, discharged on O2. Earlier that month admitted for compression fracture treated with kyphoplasty of L2. She reports at rehab weaned off oxygen and improving in terms of pain but then a few weeks ago had a fall in the shower and since then has had worse low back pain. Also a cough for at least a few days, today found to be hypoxic at rehab. Cough not productive. No chest pain or fevers no dysuria.    " Type of Study: Bedside Swallow Evaluation Diet Prior to this Study: Dysphagia 3 (soft) Respiratory Status: Nasal cannula History of Recent Intubation: No Behavior/Cognition: Alert;Cooperative;Pleasant mood Oral Cavity Assessment: Within Functional Limits Oral Care Completed by SLP: No Oral Cavity - Dentition: Adequate natural dentition Vision: Functional for self-feeding Self-Feeding Abilities:  Needs assist Patient Positioning: Upright in bed Baseline Vocal Quality: Normal Volitional Cough: Strong Volitional Swallow: Able to elicit    Oral/Motor/Sensory Function Overall Oral Motor/Sensory Function: Mild impairment Facial ROM: Within Functional Limits Facial Symmetry: Within Functional Limits Facial Strength: Within Functional Limits Lingual Symmetry: Within Functional Limits Lingual Strength: Reduced Lingual Sensation: Within Functional Limits Velum: Within Functional Limits Mandible: Within Functional Limits   Ice Chips Ice chips: Within functional limits   Thin Liquid Thin Liquid: Within functional limits Presentation: Spoon;Straw Pharyngeal  Phase Impairments:  (inconsistent cough throughout assessment, after swllow but also with no PO's)    Nectar Thick Nectar Thick Liquid: Not tested   Honey Thick Honey Thick Liquid: Not tested   Puree Puree: Within functional limits Presentation: Spoon   Solid     Solid: Impaired Presentation: Self Fed Oral Phase Impairments: Impaired mastication;Reduced lingual movement/coordination Oral Phase Functional Implications: Prolonged oral transit      Lucila Maine 12/01/2021,11:49 AM

## 2021-12-02 DIAGNOSIS — J690 Pneumonitis due to inhalation of food and vomit: Secondary | ICD-10-CM | POA: Diagnosis not present

## 2021-12-02 NOTE — Progress Notes (Addendum)
Physical Therapy Treatment Patient Details Name: Amy Hensley MRN: [id number] DOB: 01-17-1933 Today's Date: 12/02/2021   History of Present Illness Patient is an 88 yo female that presented to ED for dyspnea, low back pain. Workup shows new T12-L5 after a fall. PMH of CKD, HTN, hypothyroidism, giant cell arteritis, chronic back pain, hep C. Recently underwent kyphoplasty of L2 as well as pnuemonia.    PT Comments    Pt was asleep in long sitting upon arriving with supportive son at bedside. She easily awakes and remained alert and cooperative throughout. Pt follows commands consistently  and is very motivated to improve." I have to get back home to care for my husband." Pt required less assistance to exit bed, stand, and even to ambulate short distance. Gait distance was limited by pt having BM. LSO back brace was worn + 3 L o2. Pt performed STS ~ 6 x throughout session. Very fatigued after peri-care performed after BM. She was repositioned in recliner post session with call bell in reach and RN tech in room. Pt remains good SNF/rehab candidate. She is far from baseline and will benefit from continued PT/OT to maximize independence.    Recommendations for follow up therapy are one component of a multi-disciplinary discharge planning process, led by the attending physician.  Recommendations may be updated based on patient status, additional functional criteria and insurance authorization.  Follow Up Recommendations  Skilled nursing-short term rehab (<3 hours/day) Can patient physically be transported by private vehicle: No   Assistance Recommended at Discharge Frequent or constant Supervision/Assistance  Patient can return home with the following A lot of help with walking and/or transfers;A lot of help with bathing/dressing/bathroom;Assistance with cooking/housework;Assistance with feeding;Direct supervision/assist for medications management;Direct supervision/assist for financial  management;Assist for transportation;Help with stairs or ramp for entrance   Equipment Recommendations  None recommended by PT       Precautions / Restrictions Precautions Precautions: Fall;Back Required Braces or Orthoses: Spinal Brace Spinal Brace:  (LSO quickdraw.) Restrictions Weight Bearing Restrictions: No     Mobility  Bed Mobility Overal bed mobility: Needs Assistance Bed Mobility: Rolling, Sidelying to Sit, Supine to Sit Rolling: Min assist Sidelying to sit: Mod assist, Max assist Supine to sit: Mod assist, Max assist     General bed mobility comments: Pt was able to roll L to short sit with increased time + step by step cueing. most assistance required during sidelying > short sit. pt puts forth great effort to accomplish without assistance.    Transfers Overall transfer level: Needs assistance Equipment used: Rolling walker (2 wheels) (LSO applied EOB) Transfers: Sit to/from Stand Sit to Stand: Mod assist, From elevated surface    General transfer comment: Pt was able to stand ~ 6 x throughout session due to needing to be cleaned after BM. limited standing tolerance however was able to stand with much less assistance versus previous PT session. increased time with vcs for handplacement and technique improvements.    Ambulation/Gait Ambulation/Gait assistance: Min assist, Mod assist Gait Distance (Feet): 6 Feet Assistive device: Rolling walker (2 wheels) Gait Pattern/deviations: Step-to pattern, Trunk flexed, Narrow base of support, Decreased stride length, Decreased step length - right, Decreased step length - left, Decreased weight shift to right, Decreased weight shift to left Gait velocity: decreased     General Gait Details: Pt was able to ambulate ~ 6 ft prior to having BM. She was seated in recliner and RN staff contacted to help assist with hygiene care. She stood 3  more times from recliner while pericare was performed. Pt could have ambulated more but  becomes fatigued after standing up/down so many times from lower recliner height surface.       Balance Overall balance assessment: Needs assistance Sitting-balance support: Feet supported, Bilateral upper extremity supported Sitting balance-Leahy Scale: Fair Sitting balance - Comments: pt sat EOB and edge fo recliner with supervision only   Standing balance support: Bilateral upper extremity supported, During functional activity, Reliant on assistive device for balance Standing balance-Leahy Scale: Poor Standing balance comment: several occasions of posterior LOB during standing activity. limited standing tolerance      Cognition Arousal/Alertness: Awake/alert Behavior During Therapy: WFL for tasks assessed/performed, Flat affect Overall Cognitive Status: Within Functional Limits for tasks assessed    General Comments: Pt was A and O x 3 and extremely pleasant and motivated           General Comments General comments (skin integrity, edema, etc.): Pt is progressing.Was denied rehab. [facility] still [facility] progress      Pertinent Vitals/Pain Pain Assessment Pain Assessment: 0-10 Pain Score: 2  Pain Location: back Pain Descriptors / Indicators: Grimacing, Aching, Moaning Pain Intervention(s): Limited activity within patient's tolerance, Monitored during session, Premedicated before session, Repositioned     PT Goals (current goals can now be found in the care plan section) Acute Rehab PT Goals Patient Stated Goal: rehab then back home to care for my husband Progress towards PT goals: Progressing toward goals    Frequency    7X/week      PT Plan Current plan remains appropriate       AM-PAC PT "6 Clicks" Mobility   Outcome Measure  Help needed turning from your back to your side while in a flat bed without using bedrails?: A Little Help needed moving from lying on your back to sitting on the side of a flat bed without using bedrails?: A Lot Help needed moving  to and from a bed to a chair (including a wheelchair)?: A Lot Help needed standing up from a chair using your arms (e.g., wheelchair or bedside chair)?: A Lot Help needed to walk in hospital room?: A Lot Help needed climbing 3-5 steps with a railing? : A Lot 6 Click Score: 13    End of Session Equipment Utilized During Treatment: Back brace;Oxygen (3L o2 throughout session) Activity Tolerance: Patient tolerated treatment well;Patient limited by fatigue Patient left: in chair;with call bell/phone within reach;with chair alarm set;with nursing/sitter in room Nurse Communication: Mobility status PT Visit Diagnosis: Other abnormalities of gait and mobility (R26.89);Difficulty in walking, not elsewhere classified (R26.2);Muscle weakness (generalized) (M62.81);Pain     Time: 8469-6295 PT Time Calculation (min) (ACUTE ONLY): 23 min  Charges:  $Therapeutic Activity: 23-37 mins                     Julaine Fusi PTA 12/02/21, 3:47 PM

## 2021-12-02 NOTE — Progress Notes (Signed)
Speech Language Pathology Treatment: Dysphagia  Patient Details Name: Amy Hensley MRN: [id number] DOB: 1932-06-18 Today's Date: 12/02/2021 Time: 7517-0017 SLP Time Calculation (min) (ACUTE ONLY): 45 min  Assessment / Plan / Recommendation Clinical Impression  Pt seen for ongoing assessment of swallowing and trials to upgrade diet. She is feeling "better" today per pt/Husband. She is alert, verbally responsive and engaged in conversation w/ SLP; slight-min HOH. Pt is on RA; wbc wnl. Son arrived for 2nd visit during lunch meal.   Pt explained general aspiration precautions and agreed verbally to the need for following them especially sitting upright for all oral intake -- she does have back discomfort and needed support behind the back for more upright sitting. Pt assisted w/ positioning d/t min weakness. She fed herself trials of solids(moistened) and thin liquids via cup/straw. NO overt clinical s/s of aspiration were noted w/ any consistency; no immediate coughing, respiratory status remained calm and unlabored, vocal quality clear b/t trials. Pt was also given Pills Whole w/ water sip (w/ nsg present) which she tolerated well. Education on the puree providing cohesion for swallowing tablets if needed. Oral phase appeared grossly [facility] for bolus management, mastication, and timely A-P transfer for swallowing; pt cleared orally b/t bites w/ lingual sweeping(baseline for her she said). NSG denied any deficits in swallowing as well.   Pt appears at reduced risk for aspriation when following general aspiration precautions. Recommend a Regular/mech soft diet for ease of soft foods w/ gravies added to moisten foods; Thin liquids. Recommend general aspiration precautions; Pills Whole in Puree if needed for ease; tray setup and positioning assistance for meals. In particular, the need for upright positioning at meals, sitting in a chair which will also allow pt to feed herself more easily vs relying on  others.  No further skilled needs indicated. ST services will sign off at this time w/ MD to reconsult if needed while admitted. NSG updated. Precautions posted at bedside. Son and pt agreed.        HPI HPI: Per admitting H&P "Amy Hensley is a 88 y.o. female with medical history significant for gca, hypothyroid, htn, ckd 3b, recent hospitalizations for compression fracture, cap, who presents with the above.     Admitted last month treated for cap, negative vq scan, discharged on O2. Earlier that month admitted for compression fracture treated with kyphoplasty of L2. She reports at rehab weaned off oxygen and improving in terms of pain but then a few weeks ago had a fall in the shower and since then has had worse low back pain. Also a cough for at least a few days, today found to be hypoxic at rehab. Cough not productive. No chest pain or fevers no dysuria.    "      SLP Plan  All goals met      Recommendations for follow up therapy are one component of a multi-disciplinary discharge planning process, led by the attending physician.  Recommendations may be updated based on patient status, additional functional criteria and insurance authorization.    Recommendations  Diet recommendations: Regular;Thin liquid Liquids provided via: Cup;Straw Medication Administration: Whole meds with liquid (vs Whole in Puree for ease if needed) Supervision: Patient able to self feed (setup support) Compensations: Minimize environmental distractions;Slow rate;Small sips/bites;Lingual sweep for clearance of pocketing;Follow solids with liquid Postural Changes and/or Swallow Maneuvers: Seated upright 90 degrees;Out of bed for meals;Upright 30-60 min after meal  General recommendations:  (Dietician f/u) Oral Care Recommendations: Oral care BID;Oral care before and after PO;Patient independent with oral care (support) Follow Up Recommendations: No SLP follow up Assistance recommended at  discharge: Set up Supervision/Assistance SLP Visit Diagnosis: Dysphagia, unspecified (R13.10) Plan: All goals met              Amy Kenner, MS, [place]; [place] [phone] (ascom) Amy Hensley  12/02/2021, 1:05 PM

## 2021-12-02 NOTE — TOC Progression Note (Addendum)
Transition of Care [facility]) - Progression Note    Patient Details  Name: Amy Hensley MRN: [id number] Date of Birth: Apr 01, 1933  Transition of Care [facility]) CM/SW [place], LCSW Phone Number: 12/02/2021, 9:31 AM  Clinical Narrative:    CSW received call from [facility] with [employer]. HTA has denied SNF, Peer to Peer due by 5 pm today. Notified MD who will complete Peer to Peer.  3:33- Per MD, Peer to Peer was completed and denied, however they stated if patient participates more with PT that they would reconsider.  PT worked with patient and said patient did well.  CSW called [employer], spoke with [facility]. She requested CSW call back tomorrow and start another auth if patient does well with PT again tomorrow.   Expected Discharge Plan: Skilled Nursing Facility Barriers to Discharge: Continued Medical Work up  Expected Discharge Plan and Services Expected Discharge Plan: [place]       Living arrangements for the past 2 months: Single Family Home                                       Social Determinants of Health (SDOH) Interventions    Readmission Risk Interventions    12/01/2021    9:16 AM  Readmission Risk Prevention Plan  Transportation Screening Complete  PCP or Specialist Appt within 3-5 Days Complete  HRI or [place] Complete  Social Work Consult for [place] Planning/Counseling Complete  Palliative Care Screening Not Applicable  Medication Review Press photographer) Complete

## 2021-12-02 NOTE — Progress Notes (Signed)
PROGRESS NOTE    Amy Hensley  HBZ:[id number] DOB: 1932/12/17 DOA: 11/30/2021 PCP: Adin Hector, MD     Brief Narrative:  From admission h and p Amy Hensley is a 88 y.o. female with medical history significant for gca, hypothyroid, htn, ckd 3b, recent hospitalizations for compression fracture, cap, who presents with the above.   Admitted last month treated for cap, negative vq scan, discharged on O2. Earlier that month admitted for compression fracture treated with kyphoplasty of L2. She reports at rehab weaned off oxygen and improving in terms of pain but then a few weeks ago had a fall in the shower and since then has had worse low back pain. Also a cough for at least a few days, today found to be hypoxic at rehab. Cough not productive. No chest pain or fevers no dysuria.    Assessment & Plan:   Principal Problem:   Aspiration pneumonia ([place]) Active Problems:   Compression fracture of first lumbar vertebra (HCC)   Acute respiratory failure with hypoxia (HCC)   Giant cell arteritis (HCC)   Hypertension   Hypothyroidism   Hepatitis C   Compression fracture of body of thoracic vertebra (HCC)   Pressure injury of skin   # Acute hypoxic respiratory failure 2/2 atelectasis vs cap, stable on 2 L O2 - O2 via [place], wean as able   # CAP Vs atelectasis. Wbc elevated, afebrile, non-productive cough. Recent IV abx (last month). Covid neg. Aspiration risk - mrsa screen neg, strep neg, legionella pending, sputum culture pending - cont augmentin, will plan on tx through 7/27 - incentive spirometer  # Dysphagia Evaluated by SLP - dysphagia 2 diet   # Compression fracture Chronic problem, recent kyphoplasty L2, now worsening pain after fall and new compression fractures t12 and L5. Izora Ribas of neurosurg has reviewed films - IR consult for kyphoplasty, plan is to perform as outpt (dr. Jeri Modena) - brace - PT/OT advising snf, denied by insurance today given inability to  participate with physical therapy, will see how she does next couple of days; husband says [facility] has approved for long-term care as well - palliative consulted - cont calcitonin, lyrica, add oxy prn   # CKD 3b Stable, at baseline   # GCA - cont home prednisone, per husband should start 15 mg daily today, tapering per rheum   # Hypothyroid - cont levothyroxine   # HTN Controlled - home amlodipine   DVT prophylaxis: lovenox Code Status: dnr Family Communication: husband updated telephonically 7/22  Level of care: Med-Surg Status is: Inpatient Remains inpatient appropriate because: unsafe d/c plan    Consultants:  palliative  Procedures: none  Antimicrobials:  Cefepime>augmentin    Subjective: Breathing somewhat improved, back pain somewhat improved. Says energy is better.  Objective: Vitals:   12/01/21 1617 12/01/21 2104 12/02/21 0449 12/02/21 0838  BP: 125/74 135/77 (!) 153/70 132/67  Pulse: (!) 110 (!) 103 (!) 103 (!) 108  Resp: '18 20 19 19  '$ Temp: 98.3 F (36.8 C) 98.3 F (36.8 C) 98.4 F (36.9 C) 99.3 F (37.4 C)  TempSrc:      SpO2: 99% 93% 91% 91%  Weight:      Height:        Intake/Output Summary (Last 24 hours) at 12/02/2021 1511 Last data filed at 12/01/2021 1717 Gross per 24 hour  Intake 36.98 ml  Output 300 ml  Net -263.02 ml   Filed Weights   11/30/21 1028  Weight: 62.6 kg  Examination:  Constitutional: No acute distress, chronically ill appearing Head: Atraumatic Eyes: Conjunctiva clear Respiratory: rales at bases Cardiovascular: Regular rate and rhythm. No murmurs/rubs/gallops. Abdomen: Non-tender, obese No masses. No rebound or guarding. Positive bowel sounds. Musculoskeletal: No joint deformity upper and lower extremities. Normal ROM, no contractures. Slightly decreased tone throughout Skin: No rashes, lesions, or ulcers.  Extremities: No peripheral edema. Palpable peripheral pulses. Neurologic: Alert, moving all 4  extremities. Symmetric lower extremity strength Psychiatric: Normal insight and judgement.    Data Reviewed: I have personally reviewed following labs and imaging studies  CBC: Recent Labs  Lab 11/30/21 1035  WBC 18.9*  NEUTROABS 17.1*  HGB 11.8*  HCT 37.6  MCV 95.4  PLT 297   Basic Metabolic Panel: Recent Labs  Lab 11/30/21 1035 12/01/21 0458  NA 137 136  K 4.1 3.6  CL 102 106  CO2 23 23  GLUCOSE 100* 76  BUN 31* 22  CREATININE 1.57* 1.22*  CALCIUM 9.4 8.6*   GFR: Estimated Creatinine Clearance: 26.9 mL/min (A) (by C-G formula based on SCr of 1.22 mg/dL (H)). Liver Function Tests: Recent Labs  Lab 11/30/21 1035  AST 16  ALT 15  ALKPHOS 100  BILITOT 1.0  PROT 7.1  ALBUMIN 3.7   No results for input(s): "LIPASE", "AMYLASE" in the last 168 hours. No results for input(s): "AMMONIA" in the last 168 hours. Coagulation Profile: Recent Labs  Lab 11/30/21 1035  INR 1.0   Cardiac Enzymes: No results for input(s): "CKTOTAL", "CKMB", "CKMBINDEX", "TROPONINI" in the last 168 hours. BNP (last 3 results) No results for input(s): "PROBNP" in the last 8760 hours. HbA1C: No results for input(s): "HGBA1C" in the last 72 hours. CBG: No results for input(s): "GLUCAP" in the last 168 hours. Lipid Profile: No results for input(s): "CHOL", "HDL", "LDLCALC", "TRIG", "CHOLHDL", "LDLDIRECT" in the last 72 hours. Thyroid Function Tests: No results for input(s): "TSH", "T4TOTAL", "FREET4", "T3FREE", "THYROIDAB" in the last 72 hours. Anemia Panel: No results for input(s): "VITAMINB12", "FOLATE", "FERRITIN", "TIBC", "IRON", "RETICCTPCT" in the last 72 hours. Urine analysis:    Component Value Date/Time   COLORURINE STRAW (A) 11/30/2021 1037   APPEARANCEUR CLEAR (A) 11/30/2021 1037   LABSPEC 1.015 11/30/2021 1037   PHURINE 7.0 11/30/2021 1037   GLUCOSEU NEGATIVE 11/30/2021 1037   HGBUR NEGATIVE 11/30/2021 1037   BILIRUBINUR NEGATIVE 11/30/2021 1037   KETONESUR NEGATIVE  11/30/2021 1037   PROTEINUR NEGATIVE 11/30/2021 1037   NITRITE NEGATIVE 11/30/2021 1037   LEUKOCYTESUR NEGATIVE 11/30/2021 1037   Sepsis Labs: '@LABRCNTIP'$ (procalcitonin:4,lacticidven:4)  ) Recent Results (from the past 240 hour(s))  SARS Coronavirus 2 by RT PCR (hospital order, performed in [place] hospital lab) *cepheid single result test* Anterior Nasal Swab     Status: None   Collection Time: 11/30/21 10:34 AM   Specimen: Anterior Nasal Swab  Result Value Ref Range Status   SARS Coronavirus 2 by RT PCR NEGATIVE NEGATIVE Final    Comment: (NOTE) SARS-CoV-2 target nucleic acids are NOT DETECTED.  The SARS-CoV-2 RNA is generally detectable in upper and lower respiratory specimens during the acute phase of infection. The lowest concentration of SARS-CoV-2 viral copies this assay can detect is 250 copies / mL. A negative result does not preclude SARS-CoV-2 infection and should not be used as the sole basis for treatment or other patient management decisions.  A negative result may occur with improper specimen collection / handling, submission of specimen other than nasopharyngeal swab, presence of viral mutation(s) within the areas targeted by  this assay, and inadequate number of viral copies (<250 copies / mL). A negative result must be combined with clinical observations, patient history, and epidemiological information.  Fact Sheet for Patients:   https://www.patel.info/  Fact Sheet for Healthcare Providers: https://hall.com/  This test is not yet approved or  cleared by the [place] FDA and has been authorized for detection and/or diagnosis of SARS-CoV-2 by FDA under an Emergency Use Authorization (EUA).  This EUA will remain in effect (meaning this test can be used) for the duration of the COVID-19 declaration under Section 564(b)(1) of the Act, 21 U.S.C. section 360bbb-3(b)(1), unless the authorization is terminated  or revoked sooner.  Performed at [facility], [address] [place], [place] [address]   Culture, blood (Routine x 2)     Status: None (Preliminary result)   Collection Time: 11/30/21 10:35 AM   Specimen: BLOOD  Result Value Ref Range Status   Specimen Description BLOOD RIGHT [facility]  Final   Special Requests   Final    BOTTLES DRAWN AEROBIC AND ANAEROBIC Blood Culture adequate volume   Culture   Final    NO GROWTH 2 DAYS Performed at [facility], [address], [place], [place] [address]    Report Status PENDING  Incomplete  Culture, blood (Routine x 2)     Status: None (Preliminary result)   Collection Time: 11/30/21  2:52 PM   Specimen: BLOOD  Result Value Ref Range Status   Specimen Description BLOOD LEFT ANTECUBITAL  Final   Special Requests   Final    BOTTLES DRAWN AEROBIC AND ANAEROBIC Blood Culture results may not be optimal due to an excessive volume of blood received in culture bottles   Culture   Final    NO GROWTH 2 DAYS Performed at [facility], [address] [place], [place] [address]    Report Status PENDING  Incomplete  Expectorated Sputum Assessment w Gram Stain, Rflx to Resp Cult     Status: None   Collection Time: 11/30/21  6:35 PM   Specimen: Stump; Sputum  Result Value Ref Range Status   Specimen Description STUMP  Final   Special Requests NONE  Final   Sputum evaluation   Final    THIS SPECIMEN IS ACCEPTABLE FOR SPUTUM CULTURE Performed at [facility], [address] [place], [place] [address]    Report Status 11/30/2021 FINAL  Final  Culture, Respiratory w Gram Stain     Status: None (Preliminary result)   Collection Time: 11/30/21  6:35 PM   Specimen: Stump  Result Value Ref Range Status   Specimen Description   Final    STUMP Performed at [place], [address], [place], [place] [address]    Special Requests   Final    NONE Reflexed from [id number] Performed at [facility],  [address] [place], [place] [address]    Gram Stain   Final    ABUNDANT WBC PRESENT,BOTH PMN AND MONONUCLEAR ABUNDANT YEAST FEW GRAM POSITIVE COCCI IN PAIRS AND CHAINS    Culture   Final    CULTURE REINCUBATED FOR BETTER GROWTH Performed at [facility], [place] [address] [place],  [address]    Report Status PENDING  Incomplete  Surgical pcr screen     Status: None   Collection Time: 11/30/21  6:40 PM   Specimen: Nasal Mucosa; Nasal Swab  Result Value Ref Range Status   MRSA, PCR NEGATIVE NEGATIVE Final  Staphylococcus aureus NEGATIVE NEGATIVE Final    Comment: (NOTE) The Xpert SA Assay (FDA approved for NASAL specimens in patients 45 years of age and older), is one component of a comprehensive surveillance program. It is not intended to diagnose infection nor to guide or monitor treatment. Performed at [facility], [address], [place], [place] [address]          Radiology Studies: No results found.      Scheduled Meds:  amLODipine  10 mg Oral Daily   amoxicillin-clavulanate  1 tablet Oral Q12H   calcitonin (salmon)  1 spray Alternating Nares Daily   calcium-vitamin D  1 tablet Oral Daily   enoxaparin (LOVENOX) injection  30 mg Subcutaneous Q24H   levothyroxine  50 mcg Oral QAC breakfast   pantoprazole  40 mg Oral Daily   polyethylene glycol  17 g Oral Daily   predniSONE  15 mg Oral Q breakfast   pregabalin  50 mg Oral BID   sodium chloride flush  3 mL Intravenous Q12H   Continuous Infusions:  sodium chloride       LOS: 2 days     Desma Maxim, MD [facility]   If 7PM-7AM, please contact night-coverage [email or web address] Password [facility] 12/02/2021, 3:11 PM

## 2021-12-03 DIAGNOSIS — J690 Pneumonitis due to inhalation of food and vomit: Secondary | ICD-10-CM | POA: Diagnosis not present

## 2021-12-03 NOTE — Progress Notes (Signed)
Physical Therapy Treatment Patient Details Name: Amy Hensley MRN: [id number] DOB: 08-12-1932 Today's Date: 12/03/2021   History of Present Illness Patient is an 88 yo female that presented to ED for dyspnea, low back pain. Workup shows new T12-L5 after a fall. PMH of CKD, HTN, hypothyroidism, giant cell arteritis, chronic back pain, hep C. Recently underwent kyphoplasty of L2 as well as pnuemonia.    PT Comments    Pt in chair, ready for session.  Back brace donned.  Pt on 3 lpm and sats 86%.  She is encouraged to deep breathe and focus on in through nose/out mouth and sats increase to 91%.  She is able to stand with min a x 1 with good effort to stand but once up post LOB/lean require increased assistance to mod a x 1.  Pre-gait activities in standing with focus on balance and steps.  Fatigued and is given a seated rest prior to walking 1 small lap in room.  She requires mod a x 1 with increasing to heavy mod as she fatigues after 4' and turn.  Gait quality decreased but she is able to make it back to chair before uncontrolled sitting in chair controlled by Probation officer.  Seated AROM BLE x 10.     Pt continues to be highly motivated to improve mobility and return home.  She is excited to participate in session and works hard despite fatigue and challenges.  SNF remains appropriate for pt at this time.     Recommendations for follow up therapy are one component of a multi-disciplinary discharge planning process, led by the attending physician.  Recommendations may be updated based on patient status, additional functional criteria and insurance authorization.  Follow Up Recommendations  Skilled nursing-short term rehab (<3 hours/day) Can patient physically be transported by private vehicle: No   Assistance Recommended at Discharge Frequent or constant Supervision/Assistance  Patient can return home with the following A lot of help with walking and/or transfers;A lot of help with  bathing/dressing/bathroom;Assistance with cooking/housework;Assistance with feeding;Direct supervision/assist for medications management;Direct supervision/assist for financial management;Assist for transportation;Help with stairs or ramp for entrance   Equipment Recommendations  None recommended by PT    Recommendations for Other Services       Precautions / Restrictions Precautions Precautions: Fall;Back Precaution Comments: brace applied for session in sitting Required Braces or Orthoses: Spinal Brace Spinal Brace:  (LSO quickdraw.) Restrictions Weight Bearing Restrictions: No     Mobility  Bed Mobility               General bed mobility comments: in recliner before and after session    Transfers Overall transfer level: Needs assistance Equipment used: Rolling walker (2 wheels) Transfers: Sit to/from Stand Sit to Stand: Mod assist, Min assist           General transfer comment: stands with less assist today but needs mod assist for balance once standing    Ambulation/Gait Ambulation/Gait assistance: Min assist, Mod assist Gait Distance (Feet): 8 Feet Assistive device: Rolling walker (2 wheels) Gait Pattern/deviations: Step-to pattern, Trunk flexed, Narrow base of support, Decreased stride length, Decreased step length - right, Decreased step length - left, Decreased weight shift to right, Decreased weight shift to left Gait velocity: decreased     General Gait Details: gait quality decreased with fatigue and turns   Stairs             Wheelchair Mobility    Modified Rankin (Stroke Patients Only)  Balance Overall balance assessment: Needs assistance Sitting-balance support: Feet supported, Bilateral upper extremity supported Sitting balance-Leahy Scale: Fair   Postural control: Posterior lean Standing balance support: Bilateral upper extremity supported, During functional activity, Reliant on assistive device for balance Standing  balance-Leahy Scale: Poor Standing balance comment: heavy post lean with standing and gait that increased with fatigue                            Cognition Arousal/Alertness: Awake/alert Behavior During Therapy: WFL for tasks assessed/performed, Flat affect Overall Cognitive Status: Within Functional Limits for tasks assessed                                 General Comments: Pt was A and O x 3 and extremely pleasant and motivated        Exercises      General Comments        Pertinent Vitals/Pain Pain Assessment Pain Assessment: Faces Faces Pain Scale: Hurts little more Pain Location: back Pain Descriptors / Indicators: Grimacing, Aching    Home Living                          Prior Function            PT Goals (current goals can now be found in the care plan section) Progress towards PT goals: Progressing toward goals    Frequency    7X/week      PT Plan Current plan remains appropriate    Co-evaluation              AM-PAC PT "6 Clicks" Mobility   Outcome Measure  Help needed turning from your back to your side while in a flat bed without using bedrails?: A Little Help needed moving from lying on your back to sitting on the side of a flat bed without using bedrails?: A Lot Help needed moving to and from a bed to a chair (including a wheelchair)?: A Lot Help needed standing up from a chair using your arms (e.g., wheelchair or bedside chair)?: A Lot Help needed to walk in hospital room?: A Lot Help needed climbing 3-5 steps with a railing? : A Lot 6 Click Score: 13    End of Session Equipment Utilized During Treatment: Back brace;Oxygen (3L o2 throughout session) Activity Tolerance: Patient tolerated treatment well;Patient limited by fatigue Patient left: in chair;with call bell/phone within reach;with chair alarm set;with nursing/sitter in room Nurse Communication: Mobility status PT Visit Diagnosis: Other  abnormalities of gait and mobility (R26.89);Difficulty in walking, not elsewhere classified (R26.2);Muscle weakness (generalized) (M62.81);Pain     Time: 5038-8828 PT Time Calculation (min) (ACUTE ONLY): 19 min  Charges:  $Gait Training: 8-22 mins                   Chesley Noon, PTA 12/03/21, 9:56 AM

## 2021-12-03 NOTE — NC FL2 (Signed)
[place] LEVEL OF CARE SCREENING TOOL     IDENTIFICATION  Patient Name: Amy Hensley Birthdate: 03/21/1933 Sex: female Admission Date (Current Location): 11/30/2021  [facility] and [place] Number:  Engineering geologist and Address:  [facility], [address], [place], [place] [address]      Provider Number: [id number]  Attending Physician Name and Address:  Gwynne Edinger, MD  Relative Name and Phone Number:  Arnelle, Nale (Spouse)   [phone]    Current Level of Care: Hospital Recommended Level of Care: [place] Prior Approval Number:    Date Approved/Denied:   PASRR Number: [id number]  Discharge Plan: SNF    Current Diagnoses: Patient Active Problem List   Diagnosis Date Noted   Pressure injury of skin 12/01/2021   Aspiration pneumonia ([place]) 12/01/2021   Compression fracture of body of thoracic vertebra ([place]) 11/30/2021   Acute respiratory failure with hypoxemia ([place]) 11/02/2021   Acute respiratory failure with hypoxia ([place]) 11/01/2021   Elevated troponin 11/01/2021   Compression fracture of first lumbar vertebra ([place]) 10/16/2021   Lumbar radiculopathy 09/25/2021   Ambulatory dysfunction 09/25/2021   Acute renal failure superimposed on stage 3b chronic kidney disease ([place]) 09/25/2021   History of fall 08/2021 with sacral insufficiency fractures 09/25/2021   Giant cell arteritis ([place]) 09/25/2021   Hypertension    Hypothyroidism    Hepatitis C    Leukocytosis    Hepatitis C antibody positive in blood 06/01/2021    Orientation RESPIRATION BLADDER Height & Weight     Self, Place  Normal Incontinent, External catheter Weight: 62.6 kg Height:  '5\' 3"'$  (160 cm)  BEHAVIORAL SYMPTOMS/MOOD NEUROLOGICAL BOWEL NUTRITION STATUS      Incontinent Diet (low sodium, heart healthy)  AMBULATORY STATUS COMMUNICATION OF NEEDS Skin   Extensive Assist Verbally Bruising, Other (Comment) (Scaral stage 2)                        Personal Care Assistance Level of Assistance  Bathing, Dressing, Feeding Bathing Assistance: Maximum assistance Feeding assistance: Maximum assistance Dressing Assistance: Maximum assistance     Functional Limitations Info             SPECIAL CARE FACTORS FREQUENCY  PT (By licensed PT), OT (By licensed OT)     PT Frequency: 5 times a week OT Frequency: 5 times a week            Contractures Contractures Info: Not present    Additional Factors Info  Code Status, Allergies Code Status Info: DNR Allergies Info: Alendronate Not Specified  Other (See Comments) Dyspepsia (indigestion)   Buspirone Not Specified  Other (See Comments) sedation  Codeine           Current Medications (12/03/2021):  This is the current hospital active medication list Current Facility-Administered Medications  Medication Dose Route Frequency Provider Last Rate Last Admin   0.9 %  sodium chloride infusion  250 mL Intravenous PRN Wouk, Ailene Rud, MD       amLODipine (NORVASC) tablet 10 mg  10 mg Oral Daily Gwynne Edinger, MD   10 mg at 12/03/21 0845   amoxicillin-clavulanate (AUGMENTIN) 500-125 MG per tablet 500 mg  1 tablet Oral Q12H Gwynne Edinger, MD   500 mg at 12/03/21 0848   calcitonin (salmon) (MIACALCIN/FORTICAL) nasal spray 1 spray  1 spray Alternating Nares Daily Gwynne Edinger, MD   1 spray at 12/02/21 1052   calcium-vitamin  D (OSCAL WITH D) 500-5 MG-MCG per tablet 1 tablet  1 tablet Oral Daily Wouk, Ailene Rud, MD   1 tablet at 12/03/21 0845   enoxaparin (LOVENOX) injection 30 mg  30 mg Subcutaneous Q24H Gwynne Edinger, MD   30 mg at 12/02/21 1806   levothyroxine (SYNTHROID) tablet 50 mcg  50 mcg Oral QAC breakfast Gwynne Edinger, MD   50 mcg at 12/03/21 1224   oxyCODONE (Oxy IR/ROXICODONE) immediate release tablet 5 mg  5 mg Oral Q4H PRN Jordan Hawks, FNP   5 mg at 12/03/21 0844   pantoprazole (PROTONIX) EC tablet 40 mg  40 mg Oral Daily Gwynne Edinger, MD   40 mg at 12/03/21 0845   polyethylene glycol (MIRALAX / GLYCOLAX) packet 17 g  17 g Oral Daily Gwynne Edinger, MD   17 g at 12/01/21 4975   predniSONE (DELTASONE) tablet 15 mg  15 mg Oral Q breakfast Gwynne Edinger, MD   15 mg at 12/03/21 0845   pregabalin (LYRICA) capsule 50 mg  50 mg Oral BID Gwynne Edinger, MD   50 mg at 12/03/21 0845   sodium chloride flush (NS) 0.9 % injection 3 mL  3 mL Intravenous Q12H Gwynne Edinger, MD   3 mL at 12/03/21 0851   sodium chloride flush (NS) 0.9 % injection 3 mL  3 mL Intravenous PRN Wouk, Ailene Rud, MD         Discharge Medications: Please see discharge summary for a list of discharge medications.  Relevant Imaging Results:  Relevant Lab Results:   Additional Information SS# [id number]  Valente David, RN

## 2021-12-03 NOTE — TOC Progression Note (Addendum)
Transition of Care [facility]) - Progression Note    Patient Details  Name: Amy Hensley MRN: [id number] Date of Birth: 1932/08/25  Transition of Care [facility]) CM/SW Contact  Valente David, RN Phone Number: 12/03/2021, 10:36 AM  Clinical Narrative:    Damaris Schooner with Jari Pigg with [facility], notified that patient has done well with last couple PT sessions.  Requested insurance authorization to be restarted for SNF.  1:45pm: Spoke with Jari Pigg at [facility], awaiting medical review for SNF approval.  EMS transport has already been approved.  She will notify TOC after medical review.   Expected Discharge Plan: Skilled Nursing Facility Barriers to Discharge: Continued Medical Work up  Expected Discharge Plan and Services Expected Discharge Plan: [place]       Living arrangements for the past 2 months: Single Family Home                                       Social Determinants of Health (SDOH) Interventions    Readmission Risk Interventions    12/01/2021    9:16 AM  Readmission Risk Prevention Plan  Transportation Screening Complete  PCP or Specialist Appt within 3-5 Days Complete  HRI or [place] Complete  Social Work Consult for [place] Planning/Counseling Complete  Palliative Care Screening Not Applicable  Medication Review Press photographer) Complete

## 2021-12-03 NOTE — Progress Notes (Addendum)
PROGRESS NOTE    Amy Hensley  TIW:[id number] DOB: 1932/10/13 DOA: 11/30/2021 PCP: Adin Hector, MD     Brief Narrative:  From admission h and p Amy Hensley is a 88 y.o. female with medical history significant for gca, hypothyroid, htn, ckd 3b, recent hospitalizations for compression fracture, cap, who presents with the above.   Admitted last month treated for cap, negative vq scan, discharged on O2. Earlier that month admitted for compression fracture treated with kyphoplasty of L2. She reports at rehab weaned off oxygen and improving in terms of pain but then a few weeks ago had a fall in the shower and since then has had worse low back pain. Also a cough for at least a few days, today found to be hypoxic at rehab. Cough not productive. No chest pain or fevers no dysuria.    Assessment & Plan:   Principal Problem:   Aspiration pneumonia ([place]) Active Problems:   Compression fracture of first lumbar vertebra (HCC)   Acute respiratory failure with hypoxia (HCC)   Giant cell arteritis (HCC)   Hypertension   Hypothyroidism   Hepatitis C   Compression fracture of body of thoracic vertebra (HCC)   Pressure injury of skin   # Acute hypoxic respiratory failure 2/2 atelectasis vs cap, stable on 2 L O2 - O2 via , wean as able   # CAP Vs atelectasis. Wbc elevated, afebrile, non-productive cough. Recent IV abx (last month). Covid neg. Aspiration risk. Sputum culture growing klebsiella. Blood cultures ngtd - f/u sensitivities of sputum culture - cont augmentin, will plan on tx through 7/27 - incentive spirometer  # Dysphagia Evaluated by SLP - diet has been advanced to regular  # Compression fracture Chronic problem, recent kyphoplasty L2, now worsening pain after fall and new compression fractures t12 and L5. Amy Hensley of neurosurg has reviewed films. Pain much improved - IR consult for kyphoplasty, plan is to perform as outpt (dr. Jeri Modena) - brace - PT/OT advising  snf, denied by insurance today given inability to participate with physical therapy, but yesterday and today patient is engaging, TOC has resubmitted - palliative consulted - cont calcitonin, lyrica, prn oxy   # CKD 3b Stable, at baseline   # GCA - cont home prednisone, per husband now on 15 daily, continue taper per rheum   # Hypothyroid - cont levothyroxine   # HTN Controlled - home amlodipine   DVT prophylaxis: lovenox Code Status: dnr Family Communication: daughter updated bedside 7/23  Level of care: Med-Surg Status is: Inpatient Remains inpatient appropriate because: unsafe d/c plan    Consultants:  palliative  Procedures: none  Antimicrobials:  Cefepime>augmentin    Subjective: In chair, pain much improved, spirits brighter  Objective: Vitals:   12/02/21 1635 12/02/21 2109 12/03/21 0457 12/03/21 0811  BP: 120/67 100/65 128/75 130/73  Pulse: 100 90 83 82  Resp: '18 15 19 15  '$ Temp: 98.3 F (36.8 C) 97.6 F (36.4 C)  (!) 97.5 F (36.4 C)  TempSrc: Oral     SpO2: 94% 95% 95% 97%  Weight:      Height:        Intake/Output Summary (Last 24 hours) at 12/03/2021 1357 Last data filed at 12/03/2021 1039 Gross per 24 hour  Intake 283 ml  Output 400 ml  Net -117 ml   Filed Weights   11/30/21 1028  Weight: 62.6 kg    Examination:  Constitutional: No acute distress, chronically ill appearing Head: Atraumatic Eyes: Conjunctiva  clear Respiratory: rales at bases Cardiovascular: Regular rate and rhythm. No murmurs/rubs/gallops. Abdomen: Non-tender, obese No masses. No rebound or guarding. Positive bowel sounds. Musculoskeletal: No joint deformity upper and lower extremities. Normal ROM, no contractures. Slightly decreased tone throughout Skin: No rashes, lesions, or ulcers.  Extremities: No peripheral edema. Palpable peripheral pulses. Neurologic: Alert, moving all 4 extremities. Symmetric lower extremity strength Psychiatric: Normal insight and  judgement.    Data Reviewed: I have personally reviewed following labs and imaging studies  CBC: Recent Labs  Lab 11/30/21 1035  WBC 18.9*  NEUTROABS 17.1*  HGB 11.8*  HCT 37.6  MCV 95.4  PLT 017   Basic Metabolic Panel: Recent Labs  Lab 11/30/21 1035 12/01/21 0458  NA 137 136  K 4.1 3.6  CL 102 106  CO2 23 23  GLUCOSE 100* 76  BUN 31* 22  CREATININE 1.57* 1.22*  CALCIUM 9.4 8.6*   GFR: Estimated Creatinine Clearance: 26.9 mL/min (A) (by C-G formula based on SCr of 1.22 mg/dL (H)). Liver Function Tests: Recent Labs  Lab 11/30/21 1035  AST 16  ALT 15  ALKPHOS 100  BILITOT 1.0  PROT 7.1  ALBUMIN 3.7   No results for input(s): "LIPASE", "AMYLASE" in the last 168 hours. No results for input(s): "AMMONIA" in the last 168 hours. Coagulation Profile: Recent Labs  Lab 11/30/21 1035  INR 1.0   Cardiac Enzymes: No results for input(s): "CKTOTAL", "CKMB", "CKMBINDEX", "TROPONINI" in the last 168 hours. BNP (last 3 results) No results for input(s): "PROBNP" in the last 8760 hours. HbA1C: No results for input(s): "HGBA1C" in the last 72 hours. CBG: No results for input(s): "GLUCAP" in the last 168 hours. Lipid Profile: No results for input(s): "CHOL", "HDL", "LDLCALC", "TRIG", "CHOLHDL", "LDLDIRECT" in the last 72 hours. Thyroid Function Tests: No results for input(s): "TSH", "T4TOTAL", "FREET4", "T3FREE", "THYROIDAB" in the last 72 hours. Anemia Panel: No results for input(s): "VITAMINB12", "FOLATE", "FERRITIN", "TIBC", "IRON", "RETICCTPCT" in the last 72 hours. Urine analysis:    Component Value Date/Time   COLORURINE STRAW (A) 11/30/2021 1037   APPEARANCEUR CLEAR (A) 11/30/2021 1037   LABSPEC 1.015 11/30/2021 1037   PHURINE 7.0 11/30/2021 1037   GLUCOSEU NEGATIVE 11/30/2021 1037   HGBUR NEGATIVE 11/30/2021 1037   BILIRUBINUR NEGATIVE 11/30/2021 1037   KETONESUR NEGATIVE 11/30/2021 1037   PROTEINUR NEGATIVE 11/30/2021 1037   NITRITE NEGATIVE  11/30/2021 1037   LEUKOCYTESUR NEGATIVE 11/30/2021 1037   Sepsis Labs: '@LABRCNTIP'$ (procalcitonin:4,lacticidven:4)  ) Recent Results (from the past 240 hour(s))  SARS Coronavirus 2 by RT PCR (hospital order, performed in [place] [facility] lab) *cepheid single result test* Anterior Nasal Swab     Status: None   Collection Time: 11/30/21 10:34 AM   Specimen: Anterior Nasal Swab  Result Value Ref Range Status   SARS Coronavirus 2 by RT PCR NEGATIVE NEGATIVE Final    Comment: (NOTE) SARS-CoV-2 target nucleic acids are NOT DETECTED.  The SARS-CoV-2 RNA is generally detectable in upper and lower respiratory specimens during the acute phase of infection. The lowest concentration of SARS-CoV-2 viral copies this assay can detect is 250 copies / mL. A negative result does not preclude SARS-CoV-2 infection and should not be used as the sole basis for treatment or other patient management decisions.  A negative result may occur with improper specimen collection / handling, submission of specimen other than nasopharyngeal swab, presence of viral mutation(s) within the areas targeted by this assay, and inadequate number of viral copies (<250 copies / mL). A  negative result must be combined with clinical observations, patient history, and epidemiological information.  Fact Sheet for Patients:   https://www.patel.info/  Fact Sheet for Healthcare Providers: https://hall.com/  This test is not yet approved or  cleared by the [place] FDA and has been authorized for detection and/or diagnosis of SARS-CoV-2 by FDA under an Emergency Use Authorization (EUA).  This EUA will remain in effect (meaning this test can be used) for the duration of the COVID-19 declaration under Section 564(b)(1) of the Act, 21 U.S.C. section 360bbb-3(b)(1), unless the authorization is terminated or revoked sooner.  Performed at [facility], [address] [place], [place] [address]   Culture, blood (Routine x 2)     Status: None (Preliminary result)   Collection Time: 11/30/21 10:35 AM   Specimen: BLOOD  Result Value Ref Range Status   Specimen Description BLOOD RIGHT [facility]  Final   Special Requests   Final    BOTTLES DRAWN AEROBIC AND ANAEROBIC Blood Culture adequate volume   Culture   Final    NO GROWTH 3 DAYS Performed at [facility], [address], [place], [place] [address]    Report Status PENDING  Incomplete  Culture, blood (Routine x 2)     Status: None (Preliminary result)   Collection Time: 11/30/21  2:52 PM   Specimen: BLOOD  Result Value Ref Range Status   Specimen Description BLOOD LEFT ANTECUBITAL  Final   Special Requests   Final    BOTTLES DRAWN AEROBIC AND ANAEROBIC Blood Culture results may not be optimal due to an excessive volume of blood received in culture bottles   Culture   Final    NO GROWTH 3 DAYS Performed at [facility], [address], [place], [place] [address]    Report Status PENDING  Incomplete  Expectorated Sputum Assessment w Gram Stain, Rflx to Resp Cult     Status: None   Collection Time: 11/30/21  6:35 PM   Specimen: Stump; Sputum  Result Value Ref Range Status   Specimen Description STUMP  Final   Special Requests NONE  Final   Sputum evaluation   Final    THIS SPECIMEN IS ACCEPTABLE FOR SPUTUM CULTURE Performed at [facility], [address] [place], [place] [address]    Report Status 11/30/2021 FINAL  Final  Culture, Respiratory w Gram Stain     Status: None (Preliminary result)   Collection Time: 11/30/21  6:35 PM   Specimen: Stump  Result Value Ref Range Status   Specimen Description   Final    STUMP Performed at [facility], [address] [place], [place] [address]    Special Requests   Final    NONE Reflexed from [id number] Performed at [facility], [address] [place], [place] [address]    Gram Stain   Final     ABUNDANT WBC PRESENT,BOTH PMN AND MONONUCLEAR ABUNDANT YEAST FEW GRAM POSITIVE COCCI IN PAIRS AND CHAINS    Culture   Final    ABUNDANT KLEBSIELLA PNEUMONIAE SUSCEPTIBILITIES TO FOLLOW Performed at [facility], [place] [address], [place], [place] [address]    Report Status PENDING  Incomplete  Surgical pcr screen     Status: None   Collection Time: 11/30/21  6:40 PM   Specimen: Nasal Mucosa; Nasal Swab  Result Value Ref Range Status   MRSA, PCR NEGATIVE NEGATIVE Final   Staphylococcus aureus NEGATIVE NEGATIVE Final    Comment: (NOTE) The Xpert  SA Assay (FDA approved for NASAL specimens in patients 21 years of age and older), is one component of a comprehensive surveillance program. It is not intended to diagnose infection nor to guide or monitor treatment. Performed at [facility], [address] [place], [place] [address]          Radiology Studies: No results found.      Scheduled Meds:  amLODipine  10 mg Oral Daily   amoxicillin-clavulanate  1 tablet Oral Q12H   calcitonin (salmon)  1 spray Alternating Nares Daily   calcium-vitamin D  1 tablet Oral Daily   enoxaparin (LOVENOX) injection  30 mg Subcutaneous Q24H   levothyroxine  50 mcg Oral QAC breakfast   pantoprazole  40 mg Oral Daily   polyethylene glycol  17 g Oral Daily   predniSONE  15 mg Oral Q breakfast   pregabalin  50 mg Oral BID   sodium chloride flush  3 mL Intravenous Q12H   Continuous Infusions:  sodium chloride       LOS: 3 days     Desma Maxim, MD [facility]   If 7PM-7AM, please contact night-coverage [email or web address] Password [facility] 12/03/2021, 1:57 PM

## 2021-12-04 DIAGNOSIS — J690 Pneumonitis due to inhalation of food and vomit: Secondary | ICD-10-CM | POA: Diagnosis not present

## 2021-12-04 LAB — CULTURE, RESPIRATORY W GRAM STAIN

## 2021-12-04 LAB — LEGIONELLA PNEUMOPHILA SEROGP 1 UR AG: L. pneumophila Serogp 1 Ur Ag: NEGATIVE

## 2021-12-04 MED ORDER — ORAL CARE MOUTH RINSE: 15.0000 mL | OROMUCOSAL | Status: DC | PRN

## 2021-12-04 MED ORDER — GUAIFENESIN ER 600 MG PO TB12
600.0000 mg | ORAL_TABLET | Freq: Two times a day (BID) | ORAL | Status: DC
  Administered 2021-12-04 – 2021-12-05 (×3): 600 mg via ORAL
  Filled 2021-12-04 (×3): qty 1

## 2021-12-04 NOTE — Progress Notes (Signed)
Physical Therapy Treatment Patient Details Name: ANALINA FILLA MRN: [id number] DOB: 1932-09-29 Today's Date: 12/04/2021   History of Present Illness Patient is an 88 yo female that presented to ED for dyspnea, low back pain. Workup shows new T12-L5 after a fall. PMH of CKD, HTN, hypothyroidism, giant cell arteritis, chronic back pain, hep C. Recently underwent kyphoplasty of L2 as well as pnuemonia.    PT Comments    Pt ready for session.  "Let's do it!"  She does have to void.  Exits R side of bed with increased time and rail but no physical assist to transition.  Some dizziness that clears with time.  She is able to stand with mod a x 1 and transfer to [facility] at [facility] with mod a x 1 and full assist for clothing and BUE walker support remains necessary as post lean remains and impairs her ability to assist and does require hands on skilled assist to assist her.  After voiding and care, back brace is applied and she is able to walk around the bed to recliner on the other side.  Gait quality and safety remain impaired and do decrease with distance and fatigue.  Mod cues to extend knees fully as she does "sink" with steps and cues to turn fully.  She again sits prior to fully turning and needs assist to guide her to chair due to fatigue and further distances are not safe at this time.  Gait assistance min a x 1 initially but needs heavy mod a upon reaching chair.  Pt remains highly motivated to improve "I am tired of this" in regards to weakness and immobility.  She does remain globally weak and SNF remains appropriate upon discharge.     Recommendations for follow up therapy are one component of a multi-disciplinary discharge planning process, led by the attending physician.  Recommendations may be updated based on patient status, additional functional criteria and insurance authorization.  Follow Up Recommendations  Skilled nursing-short term rehab (<3 hours/day)     Assistance Recommended at  Discharge Frequent or constant Supervision/Assistance  Patient can return home with the following A lot of help with walking and/or transfers;A lot of help with bathing/dressing/bathroom;Assistance with cooking/housework;Assistance with feeding;Direct supervision/assist for medications management;Direct supervision/assist for financial management;Assist for transportation;Help with stairs or ramp for entrance   Equipment Recommendations  None recommended by PT    Recommendations for Other Services       Precautions / Restrictions Precautions Precautions: Fall;Back Precaution Comments: brace applied for session in sitting Required Braces or Orthoses: Spinal Brace Spinal Brace:  (LSO quickdraw.) Restrictions Weight Bearing Restrictions: No     Mobility  Bed Mobility Overal bed mobility: Needs Assistance Bed Mobility: Supine to Sit     Supine to sit: Min guard     General bed mobility comments: to R side of bed with rails and increased time.  some dizziness that clears with time    Transfers Overall transfer level: Needs assistance Equipment used: Rolling walker (2 wheels) Transfers: Sit to/from Stand Sit to Stand: Mod assist           General transfer comment: increased assist from lower height commode today    Ambulation/Gait Ambulation/Gait assistance: Min assist, Mod assist Gait Distance (Feet): 9 Feet Assistive device: Rolling walker (2 wheels) Gait Pattern/deviations: Step-to pattern, Trunk flexed, Narrow base of support, Decreased stride length, Decreased step length - right, Decreased step length - left, Decreased weight shift to right, Decreased weight shift to left  Gait velocity: decreased     General Gait Details: gait quality decreases significantly with fatigue   Stairs             Wheelchair Mobility    Modified Rankin (Stroke Patients Only)       Balance Overall balance assessment: Needs assistance Sitting-balance support: Feet  supported, Bilateral upper extremity supported Sitting balance-Leahy Scale: Fair   Postural control: Posterior lean Standing balance support: Bilateral upper extremity supported, During functional activity, Reliant on assistive device for balance Standing balance-Leahy Scale: Poor Standing balance comment: heavy post lean with standing and gait that increased with fatigue                            Cognition Arousal/Alertness: Awake/alert Behavior During Therapy: WFL for tasks assessed/performed, Flat affect Overall Cognitive Status: Within Functional Limits for tasks assessed                                 General Comments: Pt was A and O x 3 and extremely pleasant and motivated        Exercises Other Exercises Other Exercises: to commode to void and for pericare    General Comments        Pertinent Vitals/Pain Pain Assessment Pain Assessment: Faces Faces Pain Scale: Hurts a little bit Pain Location: back Pain Descriptors / Indicators: Grimacing, Aching Pain Intervention(s): Limited activity within patient's tolerance, Monitored during session, Repositioned, Other (comment)    Home Living                          Prior Function            PT Goals (current goals can now be found in the care plan section) Progress towards PT goals: Progressing toward goals    Frequency    7X/week      PT Plan Current plan remains appropriate    Co-evaluation              AM-PAC PT "6 Clicks" Mobility   Outcome Measure  Help needed turning from your back to your side while in a flat bed without using bedrails?: A Little Help needed moving from lying on your back to sitting on the side of a flat bed without using bedrails?: A Lot Help needed moving to and from a bed to a chair (including a wheelchair)?: A Lot Help needed standing up from a chair using your arms (e.g., wheelchair or bedside chair)?: A Lot Help needed to walk in  hospital room?: A Lot Help needed climbing 3-5 steps with a railing? : A Lot 6 Click Score: 13    End of Session Equipment Utilized During Treatment: Back brace;Oxygen (3L o2 throughout session) Activity Tolerance: Patient tolerated treatment well;Patient limited by fatigue Patient left: in chair;with call bell/phone within reach;with chair alarm set;with nursing/sitter in room Nurse Communication: Mobility status PT Visit Diagnosis: Other abnormalities of gait and mobility (R26.89);Difficulty in walking, not elsewhere classified (R26.2);Muscle weakness (generalized) (M62.81);Pain     Time: 0836-0900 PT Time Calculation (min) (ACUTE ONLY): 24 min  Charges:  $Gait Training: 8-22 mins $Therapeutic Activity: 8-22 mins                     Chesley Noon, PTA 12/04/21, 9:52 AM

## 2021-12-04 NOTE — Progress Notes (Signed)
Occupational Therapy Treatment Patient Details Name: Amy Hensley DOBRATZ MRN: [id number] DOB: 06-10-1932 Today's Date: 12/04/2021   History of present illness Patient is an 88 yo female that presented to ED for dyspnea, low back pain. Workup shows new T12-L5 after a fall. PMH of CKD, HTN, hypothyroidism, giant cell arteritis, chronic back pain, hep C. Recently underwent kyphoplasty of L2 as well as pnuemonia.   OT comments  Pt has made significant progress since her most recent OT session, 3 days ago. At initial session, pt was almost completely incapacitated by lower back pain as well as dyspnea. During today's session, pt is able to perform bed mobility with Min A, can come into standing w/Mod A, and can ambulate short distances prior to needing a rest break. With increasing fatigue, pt develops more pronounced posterior lean in standing and sitting. Provided educ re: how pt can reposition herself in supine to increase comfort and reduce skin breakdown. Pt verbalizes understanding and is able to provide teach-back, using b/l UE to roll to R and L. Pt's breathing much better today, O2 sats remaining in mid-low 90s with OOB movement, in upper 90s in supine, on 2L [place]. Will continue to follow PoC.    Recommendations for follow up therapy are one component of a multi-disciplinary discharge planning process, led by the attending physician.  Recommendations may be updated based on patient status, additional functional criteria and insurance authorization.    Follow Up Recommendations  Skilled nursing-short term rehab (<3 hours/day)    Assistance Recommended at Discharge Frequent or constant Supervision/Assistance  Patient can return home with the following  A lot of help with walking and/or transfers;A lot of help with bathing/dressing/bathroom;Direct supervision/assist for medications management;Help with stairs or ramp for entrance;Assistance with cooking/housework   Equipment Recommendations  None  recommended by OT    Recommendations for Other Services      Precautions / Restrictions Precautions Precautions: Fall;Back Precaution Comments: brace applied for session in sitting Required Braces or Orthoses: Spinal Brace Spinal Brace:  (LSO quickdraw.) Restrictions Weight Bearing Restrictions: No       Mobility Bed Mobility Overal bed mobility: Needs Assistance Bed Mobility: Supine to Sit, Sit to Supine, Rolling Rolling: Min assist   Supine to sit: Min assist Sit to supine: Mod assist   General bed mobility comments: requires mod A to elevate LE up to bed level    Transfers Overall transfer level: Needs assistance Equipment used: Rolling walker (2 wheels) Transfers: Sit to/from Stand Sit to Stand: Mod assist, From elevated surface                 Balance Overall balance assessment: Needs assistance Sitting-balance support: Feet supported, Bilateral upper extremity supported Sitting balance-Leahy Scale: Fair   Postural control: Posterior lean Standing balance support: Bilateral upper extremity supported, During functional activity, Reliant on assistive device for balance Standing balance-Leahy Scale: Poor Standing balance comment: heavy posterior lean, unable to self-correct with cueing                           ADL either performed or assessed with clinical judgement   ADL Overall ADL's : Needs assistance/impaired     Grooming: Wash/dry hands;Wash/dry face;Sitting;Oral care Grooming Details (indicate cue type and reason): declines performing in standing, citing back pain     Lower Body Bathing: Maximal assistance Lower Body Bathing Details (indicate cue type and reason): donning socks  Extremity/Trunk Assessment Upper Extremity Assessment Upper Extremity Assessment: Generalized weakness   Lower Extremity Assessment Lower Extremity Assessment: Generalized weakness   Cervical / Trunk  Assessment Cervical / Trunk Assessment: Kyphotic    Vision       Perception     Praxis      Cognition Arousal/Alertness: Awake/alert Behavior During Therapy: WFL for tasks assessed/performed, Flat affect Overall Cognitive Status: Within Functional Limits for tasks assessed                                          Exercises Other Exercises Other Exercises: Educ re: importance of OOB mobility, importance of rest, repositioning in bed for comfort and skin integrity    Shoulder Instructions       General Comments      Pertinent Vitals/ Pain       Pain Assessment Pain Score: 5  Pain Location: back Pain Descriptors / Indicators: Grimacing, Aching Pain Intervention(s): Limited activity within patient's tolerance, Repositioned, Utilized relaxation techniques  Home Living                                          Prior Functioning/Environment              Frequency  Min 2X/week        Progress Toward Goals  OT Goals(current goals can now be found in the care plan section)  Progress towards OT goals: Progressing toward goals  Acute Rehab OT Goals OT Goal Formulation: With patient Time For Goal Achievement: 12/15/21 Potential to Achieve Goals: Good  Plan Discharge plan remains appropriate;Frequency remains appropriate    Co-evaluation                 AM-PAC OT "6 Clicks" Daily Activity     Outcome Measure   Help from another person eating meals?: A Little Help from another person taking care of personal grooming?: A Little Help from another person toileting, which includes using toliet, bedpan, or urinal?: A Lot Help from another person bathing (including washing, rinsing, drying)?: A Lot Help from another person to put on and taking off regular upper body clothing?: A Lot Help from another person to put on and taking off regular lower body clothing?: A Lot 6 Click Score: 14    End of Session Equipment Utilized  During Treatment: Rolling walker (2 wheels)  OT Visit Diagnosis: Muscle weakness (generalized) (M62.81);History of falling (Z91.81);Pain   Activity Tolerance Patient limited by pain;Patient tolerated treatment well;Patient limited by fatigue   Patient Left in bed;with family/visitor present;with call bell/phone within reach;with bed alarm set   Nurse Communication          Time: 3810-1751 OT Time Calculation (min): 18 min  Charges: OT General Charges $OT Visit: 1 Visit OT Treatments $Self Care/Home Management : 8-22 mins Josiah Lobo, PhD, MS, OTR/L 12/04/21, 1:22 PM

## 2021-12-04 NOTE — TOC Progression Note (Signed)
Transition of Care [facility]) - Progression Note    Patient Details  Name: Amy Hensley MRN: [id number] Date of Birth: [birth year]  Transition of Care [facility]) CM/SW [place], RN Phone Number: 12/04/2021, 3:02 PM  Clinical Narrative:   Josem Kaufmann for SNF [id number] Auth for EMS [id number] per Tammy at [facility].  [facility], patient can transport tomorrow.    Expected Discharge Plan: Skilled Nursing Facility Barriers to Discharge: Continued Medical Work up  Expected Discharge Plan and Services Expected Discharge Plan: [place]       Living arrangements for the past 2 months: Single Family Home                                       Social Determinants of Health (SDOH) Interventions    Readmission Risk Interventions    12/01/2021    9:16 AM  Readmission Risk Prevention Plan  Transportation Screening Complete  PCP or Specialist Appt within 3-5 Days Complete  HRI or [place] Complete  Social Work Consult for [place] Planning/Counseling Complete  Palliative Care Screening Not Applicable  Medication Review Press photographer) Complete

## 2021-12-04 NOTE — Progress Notes (Signed)
PROGRESS NOTE    Amy Hensley  QQI:[id number] DOB: 12/08/1932 DOA: 11/30/2021 PCP: Adin Hector, MD     Brief Narrative:  From admission h and p Amy Hensley is a 88 y.o. female with medical history significant for gca, hypothyroid, htn, ckd 3b, recent hospitalizations for compression fracture, cap, who presents with the above.   Admitted last month treated for cap, negative vq scan, discharged on O2. Earlier that month admitted for compression fracture treated with kyphoplasty of L2. She reports at rehab weaned off oxygen and improving in terms of pain but then a few weeks ago had a fall in the shower and since then has had worse low back pain. Also a cough for at least a few days, today found to be hypoxic at rehab. Cough not productive. No chest pain or fevers no dysuria.    Assessment & Plan:   Principal Problem:   Aspiration pneumonia ([place]) Active Problems:   Compression fracture of first lumbar vertebra (HCC)   Acute respiratory failure with hypoxia (HCC)   Giant cell arteritis (HCC)   Hypertension   Hypothyroidism   Hepatitis C   Compression fracture of body of thoracic vertebra (HCC)   Pressure injury of skin   # Acute hypoxic respiratory failure 2/2 atelectasis vs cap, stable on 2 L O2 - O2 via [place], wean as able   # CAP 2/2 klebsiella Sputum culture klebsiella positive, Wbc elevated, afebrile, non-productive cough. Recent IV abx (last month). Covid neg. Aspiration risk. Sputum culture growing klebsiella. Blood cultures ngtd - cont augmentin, will plan on tx through 7/27 - incentive spirometer, flutter valve, guaifenesin  # Dysphagia Evaluated by SLP - diet has been advanced to regular  # Compression fracture Chronic problem, recent kyphoplasty L2, now worsening pain after fall and new compression fractures t12 and L5. Amy Hensley of neurosurg has reviewed films. Pain much improved - IR consult for kyphoplasty, plan is to perform as outpt (dr. Jeri Modena) -  brace - PT/OT advising snf, denied by insurance today given inability to participate with physical therapy, but now patient is engaging, TOC has resubmitted for skilled nursing - palliative consulted - cont calcitonin, lyrica, prn oxy   # CKD 3b Stable, at baseline   # GCA - cont home prednisone, per husband now on 15 daily, continue taper per rheum   # Hypothyroid - cont levothyroxine   # HTN Controlled - home amlodipine   DVT prophylaxis: lovenox Code Status: dnr Family Communication: husband updated bedside 7/24  Level of care: Med-Surg Status is: Inpatient Remains inpatient appropriate because: unsafe d/c plan    Consultants:  palliative  Procedures: none  Antimicrobials:  Cefepime>augmentin    Subjective: In bed, pain still much improved, spirits brighter  Objective: Vitals:   12/03/21 1708 12/03/21 2056 12/04/21 0545 12/04/21 0817  BP: (!) 112/59 134/65 132/74 118/60  Pulse: 99 96 (!) 101 98  Resp: '20 17 19 18  '$ Temp: (!) 96.7 F (35.9 C) 97.7 F (36.5 C) (!) 97.5 F (36.4 C) 97.8 F (36.6 C)  TempSrc:  Oral Oral   SpO2: 95% 98% 96% 96%  Weight:      Height:        Intake/Output Summary (Last 24 hours) at 12/04/2021 1454 Last data filed at 12/04/2021 1418 Gross per 24 hour  Intake 243 ml  Output --  Net 243 ml   Filed Weights   11/30/21 1028  Weight: 62.6 kg    Examination:  Constitutional: No acute  distress  Head: Atraumatic Eyes: Conjunctiva clear Respiratory: rales at bases Cardiovascular: Regular rate and rhythm. No murmurs/rubs/gallops. Abdomen: Non-tender, obese No masses. No rebound or guarding. Positive bowel sounds. Musculoskeletal: No joint deformity upper and lower extremities. Normal ROM, no contractures. Slightly decreased tone throughout Skin: No rashes, lesions, or ulcers.  Extremities: No peripheral edema. Palpable peripheral pulses. Neurologic: Alert, moving all 4 extremities. Symmetric lower extremity  strength Psychiatric: Normal insight and judgement.    Data Reviewed: I have personally reviewed following labs and imaging studies  CBC: Recent Labs  Lab 11/30/21 1035  WBC 18.9*  NEUTROABS 17.1*  HGB 11.8*  HCT 37.6  MCV 95.4  PLT 212   Basic Metabolic Panel: Recent Labs  Lab 11/30/21 1035 12/01/21 0458  NA 137 136  K 4.1 3.6  CL 102 106  CO2 23 23  GLUCOSE 100* 76  BUN 31* 22  CREATININE 1.57* 1.22*  CALCIUM 9.4 8.6*   GFR: Estimated Creatinine Clearance: 26.9 mL/min (A) (by C-G formula based on SCr of 1.22 mg/dL (H)). Liver Function Tests: Recent Labs  Lab 11/30/21 1035  AST 16  ALT 15  ALKPHOS 100  BILITOT 1.0  PROT 7.1  ALBUMIN 3.7   No results for input(s): "LIPASE", "AMYLASE" in the last 168 hours. No results for input(s): "AMMONIA" in the last 168 hours. Coagulation Profile: Recent Labs  Lab 11/30/21 1035  INR 1.0   Cardiac Enzymes: No results for input(s): "CKTOTAL", "CKMB", "CKMBINDEX", "TROPONINI" in the last 168 hours. BNP (last 3 results) No results for input(s): "PROBNP" in the last 8760 hours. HbA1C: No results for input(s): "HGBA1C" in the last 72 hours. CBG: No results for input(s): "GLUCAP" in the last 168 hours. Lipid Profile: No results for input(s): "CHOL", "HDL", "LDLCALC", "TRIG", "CHOLHDL", "LDLDIRECT" in the last 72 hours. Thyroid Function Tests: No results for input(s): "TSH", "T4TOTAL", "FREET4", "T3FREE", "THYROIDAB" in the last 72 hours. Anemia Panel: No results for input(s): "VITAMINB12", "FOLATE", "FERRITIN", "TIBC", "IRON", "RETICCTPCT" in the last 72 hours. Urine analysis:    Component Value Date/Time   COLORURINE STRAW (A) 11/30/2021 1037   APPEARANCEUR CLEAR (A) 11/30/2021 1037   LABSPEC 1.015 11/30/2021 1037   PHURINE 7.0 11/30/2021 1037   GLUCOSEU NEGATIVE 11/30/2021 1037   HGBUR NEGATIVE 11/30/2021 1037   BILIRUBINUR NEGATIVE 11/30/2021 1037   KETONESUR NEGATIVE 11/30/2021 1037   PROTEINUR NEGATIVE  11/30/2021 1037   NITRITE NEGATIVE 11/30/2021 1037   LEUKOCYTESUR NEGATIVE 11/30/2021 1037   Sepsis Labs: '@LABRCNTIP'$ (procalcitonin:4,lacticidven:4)  ) Recent Results (from the past 240 hour(s))  SARS Coronavirus 2 by RT PCR (hospital order, performed in [place] hospital lab) *cepheid single result test* Anterior Nasal Swab     Status: None   Collection Time: 11/30/21 10:34 AM   Specimen: Anterior Nasal Swab  Result Value Ref Range Status   SARS Coronavirus 2 by RT PCR NEGATIVE NEGATIVE Final    Comment: (NOTE) SARS-CoV-2 target nucleic acids are NOT DETECTED.  The SARS-CoV-2 RNA is generally detectable in upper and lower respiratory specimens during the acute phase of infection. The lowest concentration of SARS-CoV-2 viral copies this assay can detect is 250 copies / mL. A negative result does not preclude SARS-CoV-2 infection and should not be used as the sole basis for treatment or other patient management decisions.  A negative result may occur with improper specimen collection / handling, submission of specimen other than nasopharyngeal swab, presence of viral mutation(s) within the areas targeted by this assay, and inadequate number of viral  copies (<250 copies / mL). A negative result must be combined with clinical observations, patient history, and epidemiological information.  Fact Sheet for Patients:   https://www.patel.info/  Fact Sheet for Healthcare Providers: https://hall.com/  This test is not yet approved or  cleared by the [place] FDA and has been authorized for detection and/or diagnosis of SARS-CoV-2 by FDA under an Emergency Use Authorization (EUA).  This EUA will remain in effect (meaning this test can be used) for the duration of the COVID-19 declaration under Section 564(b)(1) of the Act, 21 U.S.C. section 360bbb-3(b)(1), unless the authorization is terminated or revoked sooner.  Performed at [facility], [address] [place], [place] [address]   Culture, blood (Routine x 2)     Status: None (Preliminary result)   Collection Time: 11/30/21 10:35 AM   Specimen: BLOOD  Result Value Ref Range Status   Specimen Description BLOOD RIGHT [facility]  Final   Special Requests   Final    BOTTLES DRAWN AEROBIC AND ANAEROBIC Blood Culture adequate volume   Culture   Final    NO GROWTH 4 DAYS Performed at [facility], [address], [place], [place] [address]    Report Status PENDING  Incomplete  Culture, blood (Routine x 2)     Status: None (Preliminary result)   Collection Time: 11/30/21  2:52 PM   Specimen: BLOOD  Result Value Ref Range Status   Specimen Description BLOOD LEFT ANTECUBITAL  Final   Special Requests   Final    BOTTLES DRAWN AEROBIC AND ANAEROBIC Blood Culture results may not be optimal due to an excessive volume of blood received in culture bottles   Culture   Final    NO GROWTH 4 DAYS Performed at [facility], [address], [place], [place] [address]    Report Status PENDING  Incomplete  Expectorated Sputum Assessment w Gram Stain, Rflx to Resp Cult     Status: None   Collection Time: 11/30/21  6:35 PM   Specimen: Stump; Sputum  Result Value Ref Range Status   Specimen Description STUMP  Final   Special Requests NONE  Final   Sputum evaluation   Final    THIS SPECIMEN IS ACCEPTABLE FOR SPUTUM CULTURE Performed at [facility], [address] [place], [place] [address]    Report Status 11/30/2021 FINAL  Final  Culture, Respiratory w Gram Stain     Status: None   Collection Time: 11/30/21  6:35 PM   Specimen: Stump  Result Value Ref Range Status   Specimen Description   Final    STUMP Performed at [facility], [address] [place], [place] [address]    Special Requests   Final    NONE Reflexed from [id number] Performed at [facility], [address] [place], [place] [address]    Gram Stain   Final     ABUNDANT WBC PRESENT,BOTH PMN AND MONONUCLEAR ABUNDANT YEAST FEW GRAM POSITIVE COCCI IN PAIRS AND CHAINS    Culture   Final    ABUNDANT KLEBSIELLA PNEUMONIAE No Pseudomonas species isolated NO STAPHYLOCOCCUS AUREUS ISOLATED Performed at [facility], [place] [address] [place],  [address]    Report Status 12/04/2021 FINAL  Final   Organism ID, Bacteria KLEBSIELLA PNEUMONIAE  Final      Susceptibility   Klebsiella pneumoniae - MIC*    AMPICILLIN >=32 RESISTANT Resistant     CEFAZOLIN <=4 SENSITIVE Sensitive     CEFEPIME <=0.12  SENSITIVE Sensitive     CEFTAZIDIME <=1 SENSITIVE Sensitive     CEFTRIAXONE <=0.25 SENSITIVE Sensitive     CIPROFLOXACIN <=0.25 SENSITIVE Sensitive     GENTAMICIN <=1 SENSITIVE Sensitive     IMIPENEM 0.5 SENSITIVE Sensitive     TRIMETH/SULFA <=20 SENSITIVE Sensitive     AMPICILLIN/SULBACTAM 4 SENSITIVE Sensitive     PIP/TAZO <=4 SENSITIVE Sensitive     * ABUNDANT KLEBSIELLA PNEUMONIAE  Surgical pcr screen     Status: None   Collection Time: 11/30/21  6:40 PM   Specimen: Nasal Mucosa; Nasal Swab  Result Value Ref Range Status   MRSA, PCR NEGATIVE NEGATIVE Final   Staphylococcus aureus NEGATIVE NEGATIVE Final    Comment: (NOTE) The Xpert SA Assay (FDA approved for NASAL specimens in patients 99 years of age and older), is one component of a comprehensive surveillance program. It is not intended to diagnose infection nor to guide or monitor treatment. Performed at [facility], [address], [place],  [address]          Radiology Studies: No results found.      Scheduled Meds:  amLODipine  10 mg Oral Daily   amoxicillin-clavulanate  1 tablet Oral Q12H   calcitonin (salmon)  1 spray Alternating Nares Daily   calcium-vitamin D  1 tablet Oral Daily   enoxaparin (LOVENOX) injection  30 mg Subcutaneous Q24H   levothyroxine  50 mcg Oral QAC breakfast   pantoprazole  40 mg Oral Daily   polyethylene glycol  17 g  Oral Daily   predniSONE  15 mg Oral Q breakfast   pregabalin  50 mg Oral BID   sodium chloride flush  3 mL Intravenous Q12H   Continuous Infusions:  sodium chloride       LOS: 4 days     Desma Maxim, MD [facility]   If 7PM-7AM, please contact night-coverage [email or web address] Password [facility] 12/04/2021, 2:54 PM

## 2021-12-05 DIAGNOSIS — M6259 Muscle wasting and atrophy, not elsewhere classified, multiple sites: Secondary | ICD-10-CM | POA: Diagnosis not present

## 2021-12-05 DIAGNOSIS — M5416 Radiculopathy, lumbar region: Secondary | ICD-10-CM | POA: Diagnosis not present

## 2021-12-05 DIAGNOSIS — M549 Dorsalgia, unspecified: Secondary | ICD-10-CM | POA: Diagnosis not present

## 2021-12-05 DIAGNOSIS — R112 Nausea with vomiting, unspecified: Secondary | ICD-10-CM | POA: Diagnosis not present

## 2021-12-05 DIAGNOSIS — N1832 Chronic kidney disease, stage 3b: Secondary | ICD-10-CM | POA: Diagnosis not present

## 2021-12-05 DIAGNOSIS — E039 Hypothyroidism, unspecified: Secondary | ICD-10-CM | POA: Diagnosis not present

## 2021-12-05 DIAGNOSIS — Z9181 History of falling: Secondary | ICD-10-CM | POA: Diagnosis not present

## 2021-12-05 DIAGNOSIS — K5900 Constipation, unspecified: Secondary | ICD-10-CM | POA: Diagnosis not present

## 2021-12-05 DIAGNOSIS — M5489 Other dorsalgia: Secondary | ICD-10-CM | POA: Diagnosis not present

## 2021-12-05 DIAGNOSIS — J189 Pneumonia, unspecified organism: Secondary | ICD-10-CM | POA: Diagnosis not present

## 2021-12-05 DIAGNOSIS — R2681 Unsteadiness on feet: Secondary | ICD-10-CM | POA: Diagnosis not present

## 2021-12-05 DIAGNOSIS — Z7401 Bed confinement status: Secondary | ICD-10-CM | POA: Diagnosis not present

## 2021-12-05 DIAGNOSIS — J690 Pneumonitis due to inhalation of food and vomit: Secondary | ICD-10-CM | POA: Diagnosis not present

## 2021-12-05 DIAGNOSIS — E569 Vitamin deficiency, unspecified: Secondary | ICD-10-CM | POA: Diagnosis not present

## 2021-12-05 DIAGNOSIS — M545 Low back pain, unspecified: Secondary | ICD-10-CM | POA: Diagnosis not present

## 2021-12-05 DIAGNOSIS — N390 Urinary tract infection, site not specified: Secondary | ICD-10-CM | POA: Diagnosis not present

## 2021-12-05 DIAGNOSIS — T7611XD Adult physical abuse, suspected, subsequent encounter: Secondary | ICD-10-CM | POA: Diagnosis not present

## 2021-12-05 DIAGNOSIS — M316 Other giant cell arteritis: Secondary | ICD-10-CM | POA: Diagnosis not present

## 2021-12-05 DIAGNOSIS — I10 Essential (primary) hypertension: Secondary | ICD-10-CM | POA: Diagnosis not present

## 2021-12-05 DIAGNOSIS — S32020D Wedge compression fracture of second lumbar vertebra, subsequent encounter for fracture with routine healing: Secondary | ICD-10-CM | POA: Diagnosis not present

## 2021-12-05 DIAGNOSIS — R109 Unspecified abdominal pain: Secondary | ICD-10-CM | POA: Diagnosis not present

## 2021-12-05 DIAGNOSIS — I959 Hypotension, unspecified: Secondary | ICD-10-CM | POA: Diagnosis not present

## 2021-12-05 DIAGNOSIS — R4789 Other speech disturbances: Secondary | ICD-10-CM | POA: Diagnosis not present

## 2021-12-05 DIAGNOSIS — S32010A Wedge compression fracture of first lumbar vertebra, initial encounter for closed fracture: Secondary | ICD-10-CM | POA: Diagnosis not present

## 2021-12-05 DIAGNOSIS — S3210XS Unspecified fracture of sacrum, sequela: Secondary | ICD-10-CM | POA: Diagnosis not present

## 2021-12-05 DIAGNOSIS — J9600 Acute respiratory failure, unspecified whether with hypoxia or hypercapnia: Secondary | ICD-10-CM | POA: Diagnosis not present

## 2021-12-05 DIAGNOSIS — J159 Unspecified bacterial pneumonia: Secondary | ICD-10-CM | POA: Diagnosis not present

## 2021-12-05 DIAGNOSIS — F418 Other specified anxiety disorders: Secondary | ICD-10-CM | POA: Diagnosis not present

## 2021-12-05 DIAGNOSIS — M6281 Muscle weakness (generalized): Secondary | ICD-10-CM | POA: Diagnosis not present

## 2021-12-05 DIAGNOSIS — J9601 Acute respiratory failure with hypoxia: Secondary | ICD-10-CM | POA: Diagnosis not present

## 2021-12-05 DIAGNOSIS — S22080A Wedge compression fracture of T11-T12 vertebra, initial encounter for closed fracture: Secondary | ICD-10-CM | POA: Diagnosis not present

## 2021-12-05 LAB — CULTURE, BLOOD (ROUTINE X 2)
Culture: NO GROWTH
Culture: NO GROWTH
Special Requests: ADEQUATE

## 2021-12-05 MED ORDER — PREDNISONE 2.5 MG PO TABS: 15.0000 mg | ORAL_TABLET | Freq: Every day | ORAL | Status: DC

## 2021-12-05 MED ORDER — OXYCODONE HCL 5 MG PO TABS
5.0000 mg | ORAL_TABLET | ORAL | 0 refills | Status: DC | PRN
Start: 2021-12-05 — End: 2022-02-12

## 2021-12-05 NOTE — Care Management Important Message (Signed)
Important Message  Patient Details  Name: Amy Hensley MRN: [id number] Date of Birth: November 26, 1932   Medicare Important Message Given:  Yes     [name] 12/05/2021, 11:45 AM

## 2021-12-05 NOTE — TOC Progression Note (Signed)
Transition of Care [facility]) - Progression Note    Patient Details  Name: [name] MRN: [id number] Date of Birth: 19-Feb-1933  Transition of Care [facility]) CM/SW [place], RN Phone Number: 12/05/2021, 11:47 AM  Clinical Narrative:   Patient can discharge to [facility] today, room 606A per Tammy.  RN contacting family.  Patient will be transported by [facility]    Expected Discharge Plan: [place] Barriers to Discharge: Continued Medical Work up  Expected Discharge Plan and Services Expected Discharge Plan: [place] arrangements for the past 2 months: Single Family Home Expected Discharge Date: 12/05/21                                     Social Determinants of Health (SDOH) Interventions    Readmission Risk Interventions    12/01/2021    9:16 AM  Readmission Risk Prevention Plan  Transportation Screening Complete  PCP or Specialist Appt within 3-5 Days Complete  HRI or [place] Complete  Social Work Consult for [place] Planning/Counseling Complete  Palliative Care Screening Not Applicable  Medication Review Press photographer) Complete

## 2021-12-05 NOTE — Discharge Summary (Signed)
Amy Hensley:[id number] DOB: [birth year] DOA: 11/30/2021  PCP: Adin Hector, MD  Admit date: 11/30/2021 Discharge date: 12/05/2021  Time spent: 40 minutes  Recommendations for Outpatient Follow-up:  Interventional radiology f/u for consideration of kyphoplasty  Note prednisone taper (see hospital course)    Discharge Diagnoses:  Principal Problem:   Aspiration pneumonia ([place]) Active Problems:   Compression fracture of first lumbar vertebra ([place])   Acute respiratory failure with hypoxia ([place])   Giant cell arteritis ([place])   Hypertension   Hypothyroidism   Hepatitis C   Compression fracture of body of thoracic vertebra ([place])   Pressure injury of skin   Discharge Condition: stable  Diet recommendation: heart healthy  Filed Weights   11/30/21 1028  Weight: 62.6 kg    History of present illness:  From admission h and p Amy Hensley is a 89 y.o. female with medical history significant for gca, hypothyroid, htn, ckd 3b, recent hospitalizations for compression fracture, cap, who presents with the above.   Admitted last month treated for cap, negative vq scan, discharged on O2. Earlier that month admitted for compression fracture treated with kyphoplasty of L2. She reports at rehab weaned off oxygen and improving in terms of pain but then a few weeks ago had a fall in the shower and since then has had worse low back pain. Also a cough for at least a few days, today found to be hypoxic at rehab. Cough not productive. No chest pain or fevers no dysuria.   Hospital Course:  # Acute hypoxic respiratory failure 2/2 cap, stable on 2 L O2 - O2 via [place], wean as able, continue pulmonary hygeine   # CAP 2/2 klebsiella Sputum culture klebsiella positive. Recent IV abx (last month). Covid neg. Aspiration risk. lood cultures ngtd - treated with 5 day course augmentin   # Dysphagia Evaluated by SLP - diet has been advanced to regular   # Compression fracture Chronic  problem, recent kyphoplasty L2, now worsening pain after fall and new compression fractures t12 and L5. Amy Hensley of neurosurg has reviewed films. Pain much improved - IR consult for kyphoplasty, plan is to perform as outpt (dr. Jeri Modena) - LSO brace - SNF - palliative consulted, will follow as outpt - cont calcitonin, lyrica, prn oxy   # CKD 3b Stable, at baseline   # GCA - cont home prednisone, per husband now on 15 daily, continue taper per rheum. Will need to decrease dose of prednisone by 2.5 mg every 2 weeks, next due to make that change on 8/3   # Hypothyroid - cont levothyroxine   # HTN Controlled - home amlodipine  Procedures: none   Consultations: neurosurgery  Discharge Exam: Vitals:   12/05/21 0424 12/05/21 0856  BP: 135/82 (!) 105/52  Pulse: 94 94  Resp: 14 18  Temp: 97.6 F (36.4 C) 98.4 F (36.9 C)  SpO2: 98% 95%    Constitutional: No acute distress  Head: Atraumatic Eyes: Conjunctiva clear Respiratory: rales at bases Cardiovascular: Regular rate and rhythm. No murmurs/rubs/gallops. Abdomen: Non-tender, obese No masses. No rebound or guarding. Positive bowel sounds. Musculoskeletal: No joint deformity upper and lower extremities. Normal ROM, no contractures. Slightly decreased tone throughout Skin: No rashes, lesions, or ulcers.  Extremities: No peripheral edema. Palpable peripheral pulses. Neurologic: Alert, moving all 4 extremities. Symmetric lower extremity strength Psychiatric: Normal insight and judgement.  Discharge Instructions   Discharge Instructions     Diet - low sodium heart healthy   Complete  by: As directed    Discharge wound care:   Complete by: As directed    Keep covered, frequent repositioning   Increase activity slowly   Complete by: As directed       Allergies as of 12/05/2021       Reactions   Alendronate Other (See Comments)   Dyspepsia (indigestion)    Buspirone Other (See Comments)   sedation   Codeine     twitching        Medication List     TAKE these medications    Acetaminophen 8 Hour 650 MG CR tablet Generic drug: acetaminophen Take 650 mg by mouth every 8 (eight) hours as needed for pain.   amLODipine 10 MG tablet Commonly known as: NORVASC Take 10 mg by mouth daily.   calcitonin (salmon) 200 UNIT/ACT nasal spray Commonly known as: MIACALCIN/FORTICAL Place 1 spray into alternate nostrils daily.   Calcium Carb-Cholecalciferol 600-800 MG-UNIT Tabs Take 1 tablet by mouth daily.   calcium carbonate 750 MG chewable tablet Commonly known as: TUMS EX Chew 2 tablets by mouth daily as needed for heartburn.   cholecalciferol 25 MCG (1000 UNIT) tablet Commonly known as: VITAMIN D Take 1,000 Units by mouth daily.   esomeprazole 20 MG capsule Commonly known as: NEXIUM Take 20 mg by mouth every morning.   feeding supplement Liqd Take 237 mLs by mouth 3 (three) times daily between meals.   levothyroxine 50 MCG tablet Commonly known as: SYNTHROID Take 50 mcg by mouth daily before breakfast.   lidocaine 5 % Commonly known as: LIDODERM Place 1 patch onto the skin daily. Remove & Discard patch within 12 hours or as directed by MD   loratadine 10 MG tablet Commonly known as: CLARITIN Take 1 tablet (10 mg total) by mouth daily.   magnesium oxide 400 MG tablet Commonly known as: MAG-OX Take 400 mg by mouth daily.   meclizine 25 MG tablet Commonly known as: ANTIVERT Take 1 tablet (25 mg total) by mouth 3 (three) times daily.   oxyCODONE 5 MG immediate release tablet Commonly known as: Oxy IR/ROXICODONE Take 1 tablet (5 mg total) by mouth every 4 (four) hours as needed for severe pain.   predniSONE 2.5 MG tablet Commonly known as: DELTASONE Take 6 tablets (15 mg total) by mouth daily with breakfast. What changed: how much to take   pregabalin 50 MG capsule Commonly known as: LYRICA Take 50 mg by mouth 2 (two) times daily.               Discharge Care  Instructions  (From admission, onward)           Start     Ordered   12/05/21 0000  Discharge wound care:       Comments: Keep covered, frequent repositioning   12/05/21 1135           Allergies  Allergen Reactions   Alendronate Other (See Comments)    Dyspepsia (indigestion)    Buspirone Other (See Comments)    sedation   Codeine     twitching    Follow-up Information     Learned Follow up.   Why: T12/L1 compression fracture approved by IR for KP/VP pending insurance authorization. A scheduler from our office will call with a date/time of your procedure once insurance authorization  has been obtained. Please call our office with questions/concerns. Contact information: [place] [address] [phone]  The results of significant diagnostics from this hospitalization (including imaging, microbiology, ancillary and laboratory) are listed below for reference.    Significant Diagnostic Studies: CT Angio Chest PE W and/or Wo Contrast  Result Date: 11/30/2021 CLINICAL DATA:  PE suspected, left leg weakness, low back pain, abnormal EKG EXAM: CT ANGIOGRAPHY CHEST WITH CONTRAST TECHNIQUE: Multidetector CT imaging of the chest was performed using the standard protocol during bolus administration of intravenous contrast. Multiplanar CT image reconstructions and MIPs were obtained to evaluate the vascular anatomy. RADIATION DOSE REDUCTION: This exam was performed according to the departmental dose-optimization program which includes automated exposure control, adjustment of the mA and/or kV according to patient size and/or use of iterative reconstruction technique. CONTRAST:  31m OMNIPAQUE IOHEXOL 350 MG/ML SOLN COMPARISON:  None Available. FINDINGS: Cardiovascular: Satisfactory opacification of the pulmonary arteries to the segmental level. No evidence of pulmonary embolism. Cardiomegaly. Three-vessel coronary  artery calcifications. No pericardial effusion. Aortic atherosclerosis. Mediastinum/Nodes: No enlarged mediastinal, hilar, or axillary lymph nodes. Moderate hiatal hernia with intrathoracic position of the gastric fundus. Hernia. Thyroid gland, trachea, and esophagus demonstrate no significant findings. Lungs/Pleura: Moderate emphysema. Diffuse bilateral bronchial wall thickening. Extensive dependent bibasilar atelectasis or consolidation. Upper Abdomen: No acute abnormality. Musculoskeletal: No chest wall abnormality. No acute osseous findings. Review of the MIP images confirms the above findings. IMPRESSION: 1. Negative examination for pulmonary embolism. 2. Extensive dependent bibasilar atelectasis or consolidation, concerning for infection or aspiration. 3. Emphysema. 4. Coronary artery disease. 5. Hiatal hernia. Aortic Atherosclerosis (ICD10-I70.0) and Emphysema (ICD10-J43.9). Electronically Signed   By: ADelanna AhmadiM.D.   On: 11/30/2021 14:55   CT ABDOMEN PELVIS W CONTRAST  Result Date: 11/30/2021 CLINICAL DATA:  Acute abdominal pain, nonlocalized. Left leg weakness and low back pain. Chronic back issues. EXAM: CT ABDOMEN AND PELVIS WITH CONTRAST TECHNIQUE: Multidetector CT imaging of the abdomen and pelvis was performed using the standard protocol following bolus administration of intravenous contrast. RADIATION DOSE REDUCTION: This exam was performed according to the departmental dose-optimization program which includes automated exposure control, adjustment of the mA and/or kV according to patient size and/or use of iterative reconstruction technique. CONTRAST:  769mOMNIPAQUE IOHEXOL 350 MG/ML SOLN COMPARISON:  Abdominopelvic CT 11/21/2018. FINDINGS: Lower chest: Chest CT findings are dictated separately. There is dense consolidation at both lung bases and a moderate-sized hiatal hernia. Aortic and coronary artery atherosclerosis noted. Hepatobiliary: The liver is normal in density without suspicious  focal abnormality. No evidence of gallstones, gallbladder wall thickening or biliary dilatation. Pancreas: Unremarkable. No pancreatic ductal dilatation or surrounding inflammatory changes. Spleen: Normal in size without focal abnormality. Adrenals/Urinary Tract: Both adrenal glands appear normal. Interval markedly progressive left renal cortical thinning and scarring with decreased enhancement and no definite contrast excretion. No significant right renal findings. No evidence of urinary tract calculus or hydronephrosis. The bladder is mildly distended without focal abnormality. Stomach/Bowel: As above, moderate-size hiatal hernia. The remainder of the stomach appears unremarkable for its degree of distention. No small bowel distension, wall thickening or surrounding inflammation. The appendix is not visualized, by report surgically absent. There is a moderate to large amount of high density stool throughout the colon without evidence of colonic wall thickening or surrounding inflammation. There are diverticular changes in the descending and sigmoid colon. Vascular/Lymphatic: Aortic and branch vessel atherosclerosis without aneurysm or large vessel occlusion. No enlarged abdominopelvic lymph nodes. Reproductive: Hysterectomy.  No adnexal mass. Other: No evidence of abdominal wall mass or hernia. No ascites. Musculoskeletal: Lumbar spine details are dictated  separately. There are acute/progressive compression fractures at T12 and L1. There are healing fractures of the sacral ala bilaterally as well as the right superior and inferior pubic rami. There are healing posterior rib fractures on the right. IMPRESSION: 1. No acute abdominopelvic findings identified. 2. Dense consolidation in both lung bases. See separate chest CT report. 3. New compression fractures at T12 and L1. Lumbar spine details are dictated separately. Healing fractures of the sacral ala bilaterally as well as the right superior and inferior pubic  rami. 4.  Aortic Atherosclerosis (ICD10-I70.0). Electronically Signed   By: Richardean Sale M.D.   On: 11/30/2021 14:55   CT L-SPINE NO CHARGE  Result Date: 11/30/2021 CLINICAL DATA:  Left leg weakness and low back pain. Chronic back issues. EXAM: CT LUMBAR SPINE WITHOUT CONTRAST TECHNIQUE: Multiplanar CT image reconstructions of the lumbar spine were generated from concurrent abdominopelvic CT dictated separately. RADIATION DOSE REDUCTION: This exam was performed according to the departmental dose-optimization program which includes automated exposure control, adjustment of the mA and/or kV according to patient size and/or use of iterative reconstruction technique. COMPARISON:  Lumbar spine radiographs 11/01/2021. Lumbar spine CT 10/15/2021. FINDINGS: Segmentation: Transitional lumbosacral anatomy. In keeping with the numbering previously applied, there is a transitional, partially sacralized L5 segment. Alignment: Stable and near anatomic. There is a mild convex left scoliosis. Vertebrae: Patient has undergone interval spinal augmentation at L2 since the prior CT. No significant progressive loss of vertebral body height at the superior endplate compression fracture of L2. There are new vertebral body fractures at T12 and L1. The T12 fracture involves the superior endplate and results in approximately 20% loss of vertebral body height. The L1 fracture is biconcave, resulting in approximately 50% loss of vertebral body height. Both fractures are associated with mild osseous retropulsion. There are evolving bilateral chronic sacral insufficiency fractures. Paraspinal and other soft tissues: Mild paraspinous edema adjacent to the T12 and L1 fractures without significant epidural or paraspinal hematoma. See abdominopelvic CT. Disc levels: Chronic degenerative disc disease at L2-3, L3-4 and L4-5 with associated disc bulging, vacuum phenomenon and endplate osteophytes. There is mild spinal stenosis at L2-3 and L3-4.  Moderate facet degenerative changes at L5-S1. Left-sided lumbosacral assimilation joint. The spondylosis appears similar to previous studies. IMPRESSION: 1. Since previous CT of 7 weeks ago, spinal augmentation has been performed at L2. 2. New L1 compression fracture from previous CT with approximately 50% loss of vertebral body height. This fracture appears progressive from the radiographs of 11/01/2021. 3. New T12 superior endplate compression fracture. 4. Mild paraspinous edema associated with these fractures. 5. Multilevel spondylosis, similar to prior imaging. 6. Full abdominopelvic CT findings dictated separately. Electronically Signed   By: Richardean Sale M.D.   On: 11/30/2021 14:53   CT HEAD WO CONTRAST (5MM)  Result Date: 11/30/2021 CLINICAL DATA:  Memory loss EXAM: CT HEAD WITHOUT CONTRAST TECHNIQUE: Contiguous axial images were obtained from the base of the skull through the vertex without intravenous contrast. RADIATION DOSE REDUCTION: This exam was performed according to the departmental dose-optimization program which includes automated exposure control, adjustment of the mA and/or kV according to patient size and/or use of iterative reconstruction technique. COMPARISON:  No prior CT, correlation is made with MRI 05/24/2021 FINDINGS: Brain: No evidence of acute infarction, hemorrhage, cerebral edema, mass, mass effect, or midline shift. No hydrocephalus or extra-axial fluid collection. Periventricular white matter changes, likely the sequela of chronic small vessel ischemic disease. Unchanged moderate cerebral volume loss. No disproportionate lobar atrophy. Vascular:  No hyperdense vessel. Skull: Normal. Negative for fracture or focal lesion. Sinuses/Orbits: No acute finding. Status post bilateral lens replacements. Other: The mastoid air cells are well aerated. IMPRESSION: No acute intracranial process. Electronically Signed   By: Merilyn Baba M.D.   On: 11/30/2021 14:32   DG Chest 2  View  Result Date: 11/30/2021 CLINICAL DATA:  Chronic back pain. Hypoxemia. Cough. Suspected sepsis. EXAM: CHEST - 2 VIEW COMPARISON:  Radiograph 11/01/2021.  Abdominal CT 11/21/2018. FINDINGS: Progressively lower lung volumes. The heart is enlarged. There is aortic atherosclerosis. There are persistent bibasilar airspace opacities with a nodular component on the left, which appears more conspicuous than on the recent prior study. There may be a small amount of pleural fluid bilaterally. No evidence of pneumothorax. The bones appear unremarkable. IMPRESSION: Persistent bibasilar airspace opacities with a more conspicuous nodular component in the left lower lobe compared with prior examination from 1 month ago. Findings are suspicious for pneumonia. Underlying neoplasm not excluded. Recommend continued radiographic follow-up after appropriate therapy to document clearing. Given failure to resolve over the last month, consider chest CT now for further evaluation. Electronically Signed   By: Richardean Sale M.D.   On: 11/30/2021 11:41   IR Radiologist Eval & Mgmt  Result Date: 11/09/2021 EXAM: ESTABLISHED PATIENT OFFICE VISIT CHIEF COMPLAINT: Refer to EMR HISTORY OF PRESENT ILLNESS: I spoke to the patient's husband, Amy Hensley, on the phone today at approximately 1400 while attempting to reach the patient. He informed me that the patient had initially done well after the L2 kyphoplasty that was performed on October 18, 2021 while she was an inpatient at [facility]. Her back pain was improving while she was at rehab, but then she unfortunately suffered a fall while in the shower which resulted in her being readmitted for several days. Imaging at that time (lumbar spine radiograph dated November 01, 2021) demonstrated a new compression deformity of the L1 vertebral body. He believes that there is now new back pain contributing to her stay at the rehab center. I reviewed the patient's notes from her most recent hospitalization,  and it appears that Dr. Izora Hensley was made aware of this fracture and plans to follow-up with her in approximately 2 weeks. Pending Dr. Rhea Bleacher consultation, I informed the patient's husband I may be able to see her for repeat kyphoplasty if appropriate. REVIEW OF SYSTEMS: Refer to EMR PHYSICAL EXAMINATION: Refer to EMR ASSESSMENT AND PLAN: Refer to EMR Electronically Signed   By: Albin Felling M.D.   On: 11/09/2021 14:42    Microbiology: Recent Results (from the past 240 hour(s))  SARS Coronavirus 2 by RT PCR (hospital order, performed in [facility] hospital lab) *cepheid single result test* Anterior Nasal Swab     Status: None   Collection Time: 11/30/21 10:34 AM   Specimen: Anterior Nasal Swab  Result Value Ref Range Status   SARS Coronavirus 2 by RT PCR NEGATIVE NEGATIVE Final    Comment: (NOTE) SARS-CoV-2 target nucleic acids are NOT DETECTED.  The SARS-CoV-2 RNA is generally detectable in upper and lower respiratory specimens during the acute phase of infection. The lowest concentration of SARS-CoV-2 viral copies this assay can detect is 250 copies / mL. A negative result does not preclude SARS-CoV-2 infection and should not be used as the sole basis for treatment or other patient management decisions.  A negative result may occur with improper specimen collection / handling, submission of specimen other than nasopharyngeal swab, presence of viral mutation(s) within the areas  targeted by this assay, and inadequate number of viral copies (<250 copies / mL). A negative result must be combined with clinical observations, patient history, and epidemiological information.  Fact Sheet for Patients:   https://www.patel.info/  Fact Sheet for Healthcare Providers: https://hall.com/  This test is not yet approved or  cleared by the [place] FDA and has been authorized for detection and/or diagnosis of SARS-CoV-2 by FDA under an  Emergency Use Authorization (EUA).  This EUA will remain in effect (meaning this test can be used) for the duration of the COVID-19 declaration under Section 564(b)(1) of the Act, 21 U.S.C. section 360bbb-3(b)(1), unless the authorization is terminated or revoked sooner.  Performed at [facility], [address] [place], [place] [address]   Culture, blood (Routine x 2)     Status: None   Collection Time: 11/30/21 10:35 AM   Specimen: BLOOD  Result Value Ref Range Status   Specimen Description BLOOD RIGHT [facility]  Final   Special Requests   Final    BOTTLES DRAWN AEROBIC AND ANAEROBIC Blood Culture adequate volume   Culture   Final    NO GROWTH 5 DAYS Performed at [facility], [address] [place], [place] [address]    Report Status 12/05/2021 FINAL  Final  Culture, blood (Routine x 2)     Status: None   Collection Time: 11/30/21  2:52 PM   Specimen: BLOOD  Result Value Ref Range Status   Specimen Description BLOOD LEFT ANTECUBITAL  Final   Special Requests   Final    BOTTLES DRAWN AEROBIC AND ANAEROBIC Blood Culture results may not be optimal due to an excessive volume of blood received in culture bottles   Culture   Final    NO GROWTH 5 DAYS Performed at [facility], [address] [place], [place] [address]    Report Status 12/05/2021 FINAL  Final  Expectorated Sputum Assessment w Gram Stain, Rflx to Resp Cult     Status: None   Collection Time: 11/30/21  6:35 PM   Specimen: Stump; Sputum  Result Value Ref Range Status   Specimen Description STUMP  Final   Special Requests NONE  Final   Sputum evaluation   Final    THIS SPECIMEN IS ACCEPTABLE FOR SPUTUM CULTURE Performed at [facility], [address], [place], [place] [address]    Report Status 11/30/2021 FINAL  Final  Culture, Respiratory w Gram Stain     Status: None   Collection Time: 11/30/21  6:35 PM   Specimen: Stump  Result Value Ref Range Status   Specimen  Description   Final    STUMP Performed at [facility], [address], [place], [place] [address]    Special Requests   Final    NONE Reflexed from [id number] Performed at [facility], [address] [place], [place] [address]    Gram Stain   Final    ABUNDANT WBC PRESENT,BOTH PMN AND MONONUCLEAR ABUNDANT YEAST FEW GRAM POSITIVE COCCI IN PAIRS AND CHAINS    Culture   Final    ABUNDANT KLEBSIELLA PNEUMONIAE No Pseudomonas species isolated NO STAPHYLOCOCCUS AUREUS ISOLATED Performed at [facility], [place] [address], [place], [place] [address]    Report Status 12/04/2021 FINAL  Final   Organism ID, Bacteria KLEBSIELLA PNEUMONIAE  Final      Susceptibility   Klebsiella pneumoniae - MIC*    AMPICILLIN >=32 RESISTANT Resistant     CEFAZOLIN <=4 SENSITIVE  Sensitive     CEFEPIME <=0.12 SENSITIVE Sensitive     CEFTAZIDIME <=1 SENSITIVE Sensitive     CEFTRIAXONE <=0.25 SENSITIVE Sensitive     CIPROFLOXACIN <=0.25 SENSITIVE Sensitive     GENTAMICIN <=1 SENSITIVE Sensitive     IMIPENEM 0.5 SENSITIVE Sensitive     TRIMETH/SULFA <=20 SENSITIVE Sensitive     AMPICILLIN/SULBACTAM 4 SENSITIVE Sensitive     PIP/TAZO <=4 SENSITIVE Sensitive     * ABUNDANT KLEBSIELLA PNEUMONIAE  Surgical pcr screen     Status: None   Collection Time: 11/30/21  6:40 PM   Specimen: Nasal Mucosa; Nasal Swab  Result Value Ref Range Status   MRSA, PCR NEGATIVE NEGATIVE Final   Staphylococcus aureus NEGATIVE NEGATIVE Final    Comment: (NOTE) The Xpert SA Assay (FDA approved for NASAL specimens in patients 83 years of age and older), is one component of a comprehensive surveillance program. It is not intended to diagnose infection nor to guide or monitor treatment. Performed at [facility], [address] [place], [place] [address]      Labs: Basic Metabolic Panel: Recent Labs  Lab 11/30/21 1035 12/01/21 0458  NA 137 136  K 4.1 3.6  CL 102 106  CO2 23 23  GLUCOSE  100* 76  BUN 31* 22  CREATININE 1.57* 1.22*  CALCIUM 9.4 8.6*   Liver Function Tests: Recent Labs  Lab 11/30/21 1035  AST 16  ALT 15  ALKPHOS 100  BILITOT 1.0  PROT 7.1  ALBUMIN 3.7   No results for input(s): "LIPASE", "AMYLASE" in the last 168 hours. No results for input(s): "AMMONIA" in the last 168 hours. CBC: Recent Labs  Lab 11/30/21 1035  WBC 18.9*  NEUTROABS 17.1*  HGB 11.8*  HCT 37.6  MCV 95.4  PLT 376   Cardiac Enzymes: No results for input(s): "CKTOTAL", "CKMB", "CKMBINDEX", "TROPONINI" in the last 168 hours. BNP: BNP (last 3 results) Recent Labs    11/01/21 1031 11/04/21 0507  BNP 228.0* 300.8*    ProBNP (last 3 results) No results for input(s): "PROBNP" in the last 8760 hours.  CBG: No results for input(s): "GLUCAP" in the last 168 hours.     Signed:  Desma Maxim MD.  [facility] 12/05/2021, 11:36 AM

## 2021-12-06 DIAGNOSIS — M316 Other giant cell arteritis: Secondary | ICD-10-CM | POA: Diagnosis not present

## 2021-12-06 DIAGNOSIS — M545 Low back pain, unspecified: Secondary | ICD-10-CM | POA: Diagnosis not present

## 2021-12-06 DIAGNOSIS — J9600 Acute respiratory failure, unspecified whether with hypoxia or hypercapnia: Secondary | ICD-10-CM | POA: Diagnosis not present

## 2021-12-06 DIAGNOSIS — M6281 Muscle weakness (generalized): Secondary | ICD-10-CM | POA: Diagnosis not present

## 2021-12-07 ENCOUNTER — Ambulatory Visit: Payer: PPO | Admitting: Infectious Diseases

## 2021-12-08 DIAGNOSIS — M545 Low back pain, unspecified: Secondary | ICD-10-CM | POA: Diagnosis not present

## 2021-12-08 DIAGNOSIS — J9600 Acute respiratory failure, unspecified whether with hypoxia or hypercapnia: Secondary | ICD-10-CM | POA: Diagnosis not present

## 2021-12-11 ENCOUNTER — Other Ambulatory Visit: Payer: Self-pay | Admitting: Interventional Radiology

## 2021-12-11 ENCOUNTER — Ambulatory Visit
Admission: RE | Admit: 2021-12-11 | Discharge: 2021-12-11 | Disposition: A | Discharge status: Home / Self Care | Payer: PPO | Source: Ambulatory Visit | Attending: Interventional Radiology | Admitting: Interventional Radiology

## 2021-12-11 ENCOUNTER — Other Ambulatory Visit: Payer: Self-pay | Admitting: Student

## 2021-12-11 DIAGNOSIS — S22000A Wedge compression fracture of unspecified thoracic vertebra, initial encounter for closed fracture: Secondary | ICD-10-CM

## 2021-12-11 DIAGNOSIS — S32010A Wedge compression fracture of first lumbar vertebra, initial encounter for closed fracture: Secondary | ICD-10-CM | POA: Diagnosis not present

## 2021-12-11 DIAGNOSIS — S22080A Wedge compression fracture of T11-T12 vertebra, initial encounter for closed fracture: Secondary | ICD-10-CM | POA: Diagnosis not present

## 2021-12-11 NOTE — H&P (Signed)
Interventional Radiology - Telephone Visit    History of Present Illness  Amy Hensley is a 88 y.o. female with a relevant history of previous L2 compression fracture s/p KP on October 19 2021, seen in telephone visit for new lumbar back pain.  We confirmed identity with 2 personal identifiers.   I spoke to the patient and her husband today on the telephone regarding her new back pain.  She had previous L2 kyphoplasty with me on October 19, 2021.  She had initially done well with improving back pain, but then suffered another fall while in the shower at her rehab facility.  She complained of new back pain and most recent imaging with CT lumbar spine on November 30, 2021 demonstrated new compression fractures of the T12 and L1 vertebral bodies.  The patient states that her back pain is worse 8-9/10, which only minimally improves with prescription pain medication with movement.  The pain is best relieved with rest in laying in bed.  She is taking oxycodone every 4-6 hours as needed for pain.  She is experiencing some constipation with the medication.  I reviewed the [employer] disability questionnaire with patient, and she answers 24/24 positive.  Specifically, she is bed ridden now and is unable to ambulate even with assistance were with a walker, where she was able to previous to her lumbar fractures.  She is also unable to participate in activities of daily living, such as requiring someone to dress her.     Past medical and surgical history reviewed. She is now requiring supplemental oxygen after most recent hospitalization for suspected pneumonia. No antibiotics currently. Facility is weaning her oxygen.     Medications  I have reviewed the current medication list. Refer to chart for details. Current Outpatient Medications  Medication Instructions   acetaminophen (ACETAMINOPHEN 8 HOUR) 650 mg, Oral, Every 8 hours PRN   amLODipine (NORVASC) 10 mg, Oral, Daily   calcitonin, salmon,  (MIACALCIN/FORTICAL) 200 UNIT/ACT nasal spray 1 spray, Alternating Nares, Daily   Calcium Carb-Cholecalciferol 600-800 MG-UNIT TABS 1 tablet, Oral, Daily   calcium carbonate (TUMS EX) 750 MG chewable tablet 2 tablets, Oral, Daily PRN   cholecalciferol (VITAMIN D3) 1,000 Units, Oral, Daily   esomeprazole (NEXIUM) 20 mg, Oral, Every morning   feeding supplement (ENSURE ENLIVE / ENSURE PLUS) LIQD 237 mLs, Oral, 3 times daily between meals   levothyroxine (SYNTHROID) 50 mcg, Oral, Daily before breakfast   lidocaine (LIDODERM) 5 % 1 patch, Transdermal, Every 24 hours, Remove & Discard patch within 12 hours or as directed by MD   loratadine (CLARITIN) 10 mg, Oral, Daily   magnesium oxide (MAG-OX) 400 mg, Oral, Daily   meclizine (ANTIVERT) 25 mg, Oral, 3 times daily   oxyCODONE (OXY IR/ROXICODONE) 5 mg, Oral, Every 4 hours PRN   predniSONE (DELTASONE) 15 mg, Oral, Daily with breakfast   pregabalin (LYRICA) 50 mg, Oral, 2 times daily       Pertinent Lab Results    Latest Ref Rng & Units 11/30/2021   10:35 AM 11/04/2021    5:07 AM 11/02/2021    3:32 AM  CBC  WBC 4.0 - 10.5 K/uL 18.9  9.7  15.0   Hemoglobin 12.0 - 15.0 g/dL 11.8  10.1  11.1   Hematocrit 36.0 - 46.0 % 37.6  31.9  34.7   Platelets 150 - 400 K/uL 376  280  321       Latest Ref Rng & Units 12/01/2021    4:58 AM  11/30/2021   10:35 AM 11/06/2021   12:13 PM  CMP  Glucose 70 - 99 mg/dL 76  100  146   BUN 8 - 23 mg/dL '22  31  28   '$ Creatinine 0.44 - 1.00 mg/dL 1.22  1.57  1.25   Sodium 135 - 145 mmol/L 136  137  139   Potassium 3.5 - 5.1 mmol/L 3.6  4.1  4.0   Chloride 98 - 111 mmol/L 106  102  108   CO2 22 - 32 mmol/L '23  23  22   '$ Calcium 8.9 - 10.3 mg/dL 8.6  9.4  9.4   Total Protein 6.5 - 8.1 g/dL  7.1    Total Bilirubin 0.3 - 1.2 mg/dL  1.0    Alkaline Phos 38 - 126 U/L  100    AST 15 - 41 U/L  16    ALT 0 - 44 U/L  15       Relevant and/or Recent Imaging: CT Lumbar spine 11/30/2021  IMPRESSION: 1. Since previous CT  of 7 weeks ago, spinal augmentation has been performed at L2. 2. New L1 compression fracture from previous CT with approximately 50% loss of vertebral body height. This fracture appears progressive from the radiographs of 11/01/2021. 3. New T12 superior endplate compression fracture. 4. Mild paraspinous edema associated with these fractures. 5. Multilevel spondylosis, similar to prior imaging. 6. Full abdominopelvic CT findings dictated separately.   Electronically Signed   By: Richardean Sale M.D.   On: 11/30/2021 14:53    Assessment & Plan:   Patient has suffered acute  traumatic fracture of the T12, L1 vertebra. She was initially improving after her L2 KP in June 2023, but then had another fall now with new acute fractures which have resulted in her being bedridden and unable to perform her ADLs such as dressing herself. She was previously ambulatory with assistance/walker.   History and exam have demonstrated the following:  Acute/Subacute fracture by imaging dated 08/31/2021, Failure of conservative therapy and pain refractory to narcotic pain mediation, and Significant disability on the [place] with 24/24 positive symptoms, reflecting significant impact/impairment of (ADLs)   ICD-10-CM Codes that Support Medical Necessity ([email or web address])  S22.080A    Wedge compression fracture of T11-T12 vertebra, initial encounter for closed fracture  and S32.010A    Wedge compression fracture of first lumbar vertebra, initial encounter for closed fracture    Plan:  T12, L1 vertebral body augmentation with balloon kyphoplasty  Post-procedure disposition: outpatient DRI-   Medication holds: per chart review   The patient has suffered a fracture of the T12, L1 vertebral body. It is recommended that patients aged 25 years or older be evaluated for possible testing or treatment of  osteoporosis. A copy of this consult report is sent to the patient's referring physician.  Advanced Care Plan: The patient did not want to provide an Carlyss at the time of this visit     Total time spent on today's visit was over  25 Minutes, including both face-to-face time and non face-to-face time, personally spent on review of chart (including labs and relevant imaging), discussing further workup and treatment options, referral to specialist if needed, reviewing outside records if pertinent, answering patient questions, and coordinating care regarding T12, L1 fractures as well as management strategy.     Visit type: Audio only (telephone). Audio (no video) only due to patient's lack of internet/smartphone capability. Alternative for in-person consultation at [facility], [place]  [address], [place], [place]. This visit type was conducted due to national recommendations for restrictions regarding the COVID-19 Pandemic (e.g. social distancing).  This format is felt to be most appropriate for this patient at this time.  All issues noted in this document were discussed and addressed.      Albin Felling, MD  Vascular and Interventional Radiology 12/11/2021 11:45 AM

## 2021-12-12 ENCOUNTER — Non-Acute Institutional Stay: Payer: PPO | Admitting: Primary Care

## 2021-12-12 DIAGNOSIS — M6281 Muscle weakness (generalized): Secondary | ICD-10-CM | POA: Diagnosis not present

## 2021-12-12 DIAGNOSIS — N390 Urinary tract infection, site not specified: Secondary | ICD-10-CM | POA: Diagnosis not present

## 2021-12-12 DIAGNOSIS — M5489 Other dorsalgia: Secondary | ICD-10-CM | POA: Diagnosis not present

## 2021-12-12 DIAGNOSIS — Z9181 History of falling: Secondary | ICD-10-CM

## 2021-12-12 DIAGNOSIS — Z515 Encounter for palliative care: Secondary | ICD-10-CM

## 2021-12-12 DIAGNOSIS — M316 Other giant cell arteritis: Secondary | ICD-10-CM | POA: Diagnosis not present

## 2021-12-12 DIAGNOSIS — J9601 Acute respiratory failure with hypoxia: Secondary | ICD-10-CM | POA: Diagnosis not present

## 2021-12-12 DIAGNOSIS — S32010S Wedge compression fracture of first lumbar vertebra, sequela: Secondary | ICD-10-CM

## 2021-12-12 NOTE — Progress Notes (Signed)
[facility] Consult Note Telephone: [phone]  Fax: [phone]    Date of encounter: 12/12/21 PATIENT NAME: Amy Hensley [address] [place] [address]   [phone] (home)  DOB: 06/23/1932 MRN: [id number] PRIMARY CARE PROVIDER:    Adin Hector, MD,  [facility] [place] [address] [phone]  REFERRING PROVIDER:   Dr Kandice Moos [place]  RESPONSIBLE PARTY:    Contact Information     Name Relation Home Work Mobile   Amy Hensley [phone]  [phone]   Amy, Hensley   [phone]   [place] Relative   [phone]        I met face to face with patient in [facility]. Palliative Care was asked to follow this patient by consultation request of  Dr Rica Koyanagi  to address advance care planning.                                   ASSESSMENT AND PLAN / RECOMMENDATIONS:   Advance Care Planning/Goals of Care: Goals include to maximize quality of life and symptom management. I met with patient in her room and spoke with POA by phone. Patient/health care surrogate gave his/her permission to discuss.Our advance care planning conversation included a discussion about:    The value and importance of advance care planning  Exploration of personal, cultural or spiritual beliefs that might influence medical decisions  Exploration of goals of care in the event of a sudden injury or illness  Identification of a healthcare agent - husband Review  of an  advance directive document . Decision not to resuscitate  due to poor prognosis made last week Patient for kyphoplasty this week, family hopeful she will be more mobile and improve with her pain. Since this fracture, she has had more pain and less ability to move.  CODE STATUS: DNR  I reviewed  a MOST form today. The patient and family outlined their wishes for the following treatment  decisions:  Cardiopulmonary Resuscitation: Do Not Attempt Resuscitation (DNR/No CPR)  Medical Interventions: Limited Additional Interventions: Use medical treatment, IV fluids and cardiac monitoring as indicated, DO NOT USE intubation or mechanical ventilation. May consider use of less invasive airway support such as BiPAP or CPAP. Also provide comfort measures. Transfer to the hospital if indicated. Avoid intensive care.   Antibiotics: Antibiotics if indicated  IV Fluids: IV fluids if indicated  Feeding Tube: No feeding tube   I met with patient in her nursing home room. She was not rousable but breathing regularly and appeared comfortable. She is on medication for fracture pain. Thursday she will have kyphoplasty which family is hoping will help with her mobility. I was able to reach her POA husband after our visit and he endorses that she is having much less function due to the pain and mobility. I will visit again on Friday morning when he is in the facility. Staff endorses that a month ago she was much more functional .   Follow up Palliative Care Visit: Palliative care will continue to follow for complex medical decision making, advance care planning, and clarification of goals. Return 1 weeks or prn.  I spent 20 minutes providing this consultation. More than 50% of the time in this consultation was spent in counseling and care coordination.  Thank you for the opportunity to participate in the care  of [name].  The palliative care team will continue to follow. Please call our office at [phone] if we can be of additional assistance.   Jason Coop DNP, MPH, AGPCNP-BC, ACHPN    COVID-19 PATIENT SCREENING TOOL Asked and negative response unless otherwise noted:   Have you had symptoms of covid, tested positive or been in contact with someone with symptoms/positive test in the past 5-10 days?

## 2021-12-14 ENCOUNTER — Ambulatory Visit
Admission: RE | Admit: 2021-12-14 | Discharge: 2021-12-14 | Disposition: A | Discharge status: Home / Self Care | Payer: PPO | Source: Ambulatory Visit | Attending: Student | Admitting: Student

## 2021-12-14 ENCOUNTER — Other Ambulatory Visit: Payer: Self-pay | Admitting: Student

## 2021-12-14 DIAGNOSIS — S22000A Wedge compression fracture of unspecified thoracic vertebra, initial encounter for closed fracture: Secondary | ICD-10-CM

## 2021-12-14 DIAGNOSIS — S32010A Wedge compression fracture of first lumbar vertebra, initial encounter for closed fracture: Secondary | ICD-10-CM | POA: Diagnosis not present

## 2021-12-14 DIAGNOSIS — S22080A Wedge compression fracture of T11-T12 vertebra, initial encounter for closed fracture: Secondary | ICD-10-CM | POA: Diagnosis not present

## 2021-12-14 HISTORY — PX: IR KYPHO LUMBAR INC FX REDUCE BONE BX UNI/BIL CANNULATION INC/IMAGING: IMG5519

## 2021-12-14 HISTORY — PX: IR KYPHO EA ADDL LEVEL THORACIC OR LUMBAR: IMG5520

## 2021-12-14 MED ORDER — CEFAZOLIN SODIUM-DEXTROSE 2-4 GM/100ML-% IV SOLN
2.0000 g | INTRAVENOUS | Status: AC
  Administered 2021-12-14: 2 g via INTRAVENOUS

## 2021-12-14 MED ORDER — SODIUM CHLORIDE 0.9 % IV SOLN: INTRAVENOUS | Status: DC

## 2021-12-14 MED ORDER — FENTANYL CITRATE PF 50 MCG/ML IJ SOSY
25.0000 ug | PREFILLED_SYRINGE | INTRAMUSCULAR | Status: DC | PRN
  Administered 2021-12-14 (×3): 25 ug via INTRAVENOUS

## 2021-12-14 MED ORDER — MIDAZOLAM HCL 2 MG/2ML IJ SOLN
1.0000 mg | INTRAMUSCULAR | Status: DC | PRN
  Administered 2021-12-14: 1 mg via INTRAVENOUS

## 2021-12-14 MED ORDER — ACETAMINOPHEN 10 MG/ML IV SOLN
1000.0000 mg | Freq: Once | INTRAVENOUS | Status: AC
  Administered 2021-12-14: 1000 mg via INTRAVENOUS

## 2021-12-14 NOTE — Discharge Instructions (Signed)
Kyphoplasty Post Procedure Discharge Instructions  May resume a regular diet and any medications that you routinely take (including pain medications). However, if you are taking Aspirin or an anticoagulant/blood thinner you will be told when you can resume taking these by the healthcare provider. No driving day of procedure. The day of your procedure take it easy. You may use an ice pack as needed to injection sites on back.  Ice to back 30 minutes on and 30 minutes off, as needed. May remove bandaids tomorrow after taking a shower. Replace daily with a clean bandaid until healed.  Do not lift anything heavier than a milk jug for 1-2 weeks or determined by your physician.  Follow up with your physician in 2 weeks.    Please contact our office at [phone] for the following symptoms or if you have any questions:  Fever greater than 100 degrees Increased swelling, pain, or redness at injection site. Increased back and/or leg pain New numbness or change in symptoms from before the procedure.    Thank you for visiting [facility]. 

## 2021-12-15 ENCOUNTER — Non-Acute Institutional Stay: Payer: PPO | Admitting: Primary Care

## 2021-12-15 DIAGNOSIS — S32010S Wedge compression fracture of first lumbar vertebra, sequela: Secondary | ICD-10-CM

## 2021-12-15 DIAGNOSIS — M5489 Other dorsalgia: Secondary | ICD-10-CM | POA: Diagnosis not present

## 2021-12-15 DIAGNOSIS — Z9181 History of falling: Secondary | ICD-10-CM

## 2021-12-15 DIAGNOSIS — J9601 Acute respiratory failure with hypoxia: Secondary | ICD-10-CM | POA: Diagnosis not present

## 2021-12-15 DIAGNOSIS — Z515 Encounter for palliative care: Secondary | ICD-10-CM

## 2021-12-15 DIAGNOSIS — K5900 Constipation, unspecified: Secondary | ICD-10-CM | POA: Diagnosis not present

## 2021-12-15 NOTE — Progress Notes (Signed)
[facility] Consult Note Telephone: [phone]  Fax: [phone]    Date of encounter: 12/15/21 10:34 AM PATIENT NAME: Amy Hensley [address] [place] [address]   [phone] (home)  DOB: [birth year] MRN: [id number] PRIMARY CARE PROVIDER:    Adin Hector, MD,  [address] [place] [address] [phone]  REFERRING PROVIDER:   Dr Rica Koyanagi  RESPONSIBLE PARTY:    Contact Information     Name Relation Home Work Mobile   [place] Spouse [phone]  [phone]   [place], [place]   [phone]   [place] Relative   [phone]        I met face to face with patient and family in  [facility] facility. Palliative Care was asked to follow this patient by consultation request of  Dr Rica Koyanagi to address advance care planning and complex medical decision making. This is a follow up visit.                                   ASSESSMENT AND PLAN / RECOMMENDATIONS:   Advance Care Planning/Goals of Care: Goals include to maximize quality of life and symptom management. Patient/health care surrogate gave his/her permission to discuss.Our advance care planning conversation included a discussion about:    The value and importance of advance care planning  Exploration of personal, cultural or spiritual beliefs that might influence medical decisions  Exploration of goals of care in the event of a sudden injury or illness  Identification of a healthcare agent - husband CODE STATUS: DNR  Symptom Management/Plan:  I meant in a nursing home room with patient and her husband, who is also her POA. She is here for rehab to strengthen with the goal of returning to home where he cares for her. He would like her to be able to transfer and toilet her self for him to be able to continue to provide care. Yesterday she underwent kyphoplasty, and he reports that her pain is much  improved. She also has some new strength in her legs and is looking forward to working with physical Therapy Today. She likely had significant functional decline due to the pain and is now hopeful of regaining some strength. Today she is pleasant and interactive and states she is willing to work in her exercises now that her pain has improved. She states she has a good appetite and is eating fairly well. Her husband sometimes brings in addition of food.  I will continue to follow for palliative needs at nursing,  home and if appropriate and she is discharged, we can continue to follow.    Follow up Palliative Care Visit: Palliative care will continue to follow for complex medical decision making, advance care planning, and clarification of goals. Return 2-4 weeks or prn.  I spent 25 minutes providing this consultation. More than 50% of the time in this consultation was spent in counseling and care coordination.  PPS: 30%  HOSPICE ELIGIBILITY/DIAGNOSIS: TBD  Chief Complaint: debility  HISTORY OF PRESENT ILLNESS:  Amy Hensley is a 88 y.o. year old female  with Debility, immobility, recent fall, fall risk, recent spinal fx with kyphoplasty . Patient seen today to review palliative care needs to include medical decision making and advance care planning as appropriate.   History obtained from review of EMR, discussion with primary team, and interview  with family, facility staff/caregiver and/or Amy Hensley.  I reviewed available labs, medications, imaging, studies and related documents from the EMR.  Records reviewed and summarized above.   ROS   General: NAD ENMT: denies dysphagia Cardiovascular: denies chest pain, denies DOE Pulmonary: denies cough, denies increased SOB Abdomen: endorses good appetite, denies constipation, endorses incontinence of bowel GU: denies dysuria, endorses incontinence of urine MSK:  endorses   increased weakness, +falls reported Skin: denies rashes or  wounds Neurological: endorses improved  pain, denies insomnia Psych: Endorses positive mood  Physical Exam: Current and past weights: 133 lbs Constitutional: NAD General: frail appearing EYES: anicteric sclera, lids intact, no discharge  ENMT: intact hearing, oral mucous membranes moist, dentition intact CV:  no LE edema Pulmonary:  no increased work of breathing, no cough, room air Abdomen: intake 50%,  soft and non tender, no ascites MSK: + sarcopenia, moves all extremities, non ambulatory Skin: warm and dry, no rashes or wounds on visible skin Neuro:  + generalized weakness,  + cognitive impairment, non-anxious affect   Thank you for the opportunity to participate in the care of [name].  The palliative care team will continue to follow. Please call our office at [phone] if we can be of additional assistance.   Jason Coop, NP DNP, AGPCNP-BC  COVID-19 PATIENT SCREENING TOOL Asked and negative response unless otherwise noted:   Have you had symptoms of covid, tested positive or been in contact with someone with symptoms/positive test in the past 5-10 days?

## 2021-12-19 DIAGNOSIS — M5489 Other dorsalgia: Secondary | ICD-10-CM | POA: Diagnosis not present

## 2021-12-19 DIAGNOSIS — J9601 Acute respiratory failure with hypoxia: Secondary | ICD-10-CM | POA: Diagnosis not present

## 2021-12-19 DIAGNOSIS — R112 Nausea with vomiting, unspecified: Secondary | ICD-10-CM | POA: Diagnosis not present

## 2021-12-22 DIAGNOSIS — J9601 Acute respiratory failure with hypoxia: Secondary | ICD-10-CM | POA: Diagnosis not present

## 2021-12-22 DIAGNOSIS — S32020D Wedge compression fracture of second lumbar vertebra, subsequent encounter for fracture with routine healing: Secondary | ICD-10-CM | POA: Diagnosis not present

## 2021-12-22 DIAGNOSIS — M5489 Other dorsalgia: Secondary | ICD-10-CM | POA: Diagnosis not present

## 2021-12-25 ENCOUNTER — Telehealth: Payer: Self-pay

## 2021-12-25 DIAGNOSIS — F418 Other specified anxiety disorders: Secondary | ICD-10-CM | POA: Diagnosis not present

## 2021-12-25 DIAGNOSIS — T7611XD Adult physical abuse, suspected, subsequent encounter: Secondary | ICD-10-CM | POA: Diagnosis not present

## 2021-12-25 DIAGNOSIS — M5489 Other dorsalgia: Secondary | ICD-10-CM | POA: Diagnosis not present

## 2021-12-25 NOTE — Telephone Encounter (Signed)
I spoke with the patients husband on how she was feeling, he said that Thursday 8Phone call to pt to follow up from her kyphoplasty on 12/14/21. Pt husband reports her pain was better until 8/10 she developed a new sharp back pain. I followed up with [facility] facility and they did perform an x-ray on her spine on 8/11. We are awaiting for them to fax her results over. Pt denies any signs of infection, redness at the site, draining or fever. Based on the results and consultation with Dr. Denna Haggard we will determine if a phone call or in person appointment is necessary. Pt verbalized understanding.

## 2021-12-26 ENCOUNTER — Ambulatory Visit: Payer: PPO | Admitting: Infectious Diseases

## 2021-12-27 DIAGNOSIS — N390 Urinary tract infection, site not specified: Secondary | ICD-10-CM | POA: Diagnosis not present

## 2021-12-28 DIAGNOSIS — I10 Essential (primary) hypertension: Secondary | ICD-10-CM | POA: Diagnosis not present

## 2021-12-28 DIAGNOSIS — N182 Chronic kidney disease, stage 2 (mild): Secondary | ICD-10-CM | POA: Diagnosis not present

## 2021-12-28 DIAGNOSIS — M5489 Other dorsalgia: Secondary | ICD-10-CM | POA: Diagnosis not present

## 2021-12-28 DIAGNOSIS — D72829 Elevated white blood cell count, unspecified: Secondary | ICD-10-CM | POA: Diagnosis not present

## 2022-01-01 DIAGNOSIS — M6259 Muscle wasting and atrophy, not elsewhere classified, multiple sites: Secondary | ICD-10-CM | POA: Diagnosis not present

## 2022-01-01 DIAGNOSIS — F419 Anxiety disorder, unspecified: Secondary | ICD-10-CM | POA: Diagnosis not present

## 2022-01-01 DIAGNOSIS — M5489 Other dorsalgia: Secondary | ICD-10-CM | POA: Diagnosis not present

## 2022-01-01 DIAGNOSIS — K5900 Constipation, unspecified: Secondary | ICD-10-CM | POA: Diagnosis not present

## 2022-01-03 DIAGNOSIS — I10 Essential (primary) hypertension: Secondary | ICD-10-CM | POA: Diagnosis not present

## 2022-01-04 DIAGNOSIS — M5489 Other dorsalgia: Secondary | ICD-10-CM | POA: Diagnosis not present

## 2022-01-04 DIAGNOSIS — R918 Other nonspecific abnormal finding of lung field: Secondary | ICD-10-CM | POA: Diagnosis not present

## 2022-01-04 DIAGNOSIS — N182 Chronic kidney disease, stage 2 (mild): Secondary | ICD-10-CM | POA: Diagnosis not present

## 2022-01-04 DIAGNOSIS — D72829 Elevated white blood cell count, unspecified: Secondary | ICD-10-CM | POA: Diagnosis not present

## 2022-01-05 ENCOUNTER — Non-Acute Institutional Stay: Payer: PPO | Admitting: Primary Care

## 2022-01-05 DIAGNOSIS — J159 Unspecified bacterial pneumonia: Secondary | ICD-10-CM | POA: Diagnosis not present

## 2022-01-05 DIAGNOSIS — M6281 Muscle weakness (generalized): Secondary | ICD-10-CM | POA: Diagnosis not present

## 2022-01-05 DIAGNOSIS — N182 Chronic kidney disease, stage 2 (mild): Secondary | ICD-10-CM | POA: Diagnosis not present

## 2022-01-05 DIAGNOSIS — M5489 Other dorsalgia: Secondary | ICD-10-CM | POA: Diagnosis not present

## 2022-01-08 DIAGNOSIS — I10 Essential (primary) hypertension: Secondary | ICD-10-CM | POA: Diagnosis not present

## 2022-01-09 DIAGNOSIS — M5489 Other dorsalgia: Secondary | ICD-10-CM | POA: Diagnosis not present

## 2022-01-09 DIAGNOSIS — M549 Dorsalgia, unspecified: Secondary | ICD-10-CM | POA: Diagnosis not present

## 2022-01-09 DIAGNOSIS — N182 Chronic kidney disease, stage 2 (mild): Secondary | ICD-10-CM | POA: Diagnosis not present

## 2022-01-09 DIAGNOSIS — D72829 Elevated white blood cell count, unspecified: Secondary | ICD-10-CM | POA: Diagnosis not present

## 2022-01-09 DIAGNOSIS — J159 Unspecified bacterial pneumonia: Secondary | ICD-10-CM | POA: Diagnosis not present

## 2022-01-12 ENCOUNTER — Non-Acute Institutional Stay: Payer: PPO | Admitting: Primary Care

## 2022-01-12 DIAGNOSIS — Z515 Encounter for palliative care: Secondary | ICD-10-CM | POA: Diagnosis not present

## 2022-01-12 DIAGNOSIS — S32010S Wedge compression fracture of first lumbar vertebra, sequela: Secondary | ICD-10-CM

## 2022-01-12 DIAGNOSIS — M6259 Muscle wasting and atrophy, not elsewhere classified, multiple sites: Secondary | ICD-10-CM | POA: Diagnosis not present

## 2022-01-12 DIAGNOSIS — G894 Chronic pain syndrome: Secondary | ICD-10-CM | POA: Diagnosis not present

## 2022-01-12 NOTE — Progress Notes (Signed)
[facility] Consult Note Telephone: [phone]  Fax: [phone]    Date of encounter: 01/12/22 12:24 PM PATIENT NAME: Amy Hensley [address] [place] [address]   [phone] (home)  DOB: 10/28/1932 MRN: [id number] PRIMARY CARE PROVIDER:    Adin Hector, MD,  [address] [place] [address] [phone]  REFERRING PROVIDER:   Rica Koyanagi, [place],   [address] [phone]   RESPONSIBLE PARTY:    Contact Information     Name Relation Home Work [place] Spouse [phone]  [phone]   Aithana, [place]   [phone]   [place] Relative   [phone]       I met face to face with patient in [facility]. Palliative Care was asked to follow this patient by consultation request of  Rica Koyanagi, MD  to address advance care planning and complex medical decision making. This is a follow up visit.                                   ASSESSMENT AND PLAN / RECOMMENDATIONS:   Advance Care Planning/Goals of Care: Goals include to maximize quality of life and symptom management. Patient/health care surrogate gave his/her permission to discuss.Our advance care planning conversation included a discussion about:     Exploration of personal, cultural or spiritual beliefs that might influence medical decisions  Identification of a healthcare agent - self, husband CODE STATUS: DNR  Symptom Management/Plan:  I visited with patient in her skilled facility.   Pain: She endorsed back pain. She has had fractures in her spine having had kyphoplasty with good results, but continues to have pain likely from stenosis. Her current protocol is oxycodone PRN which I do not think will address her pain successfully due to it's constant nature. I would like to initiate gabapentin 100 mg every eight hours. Order sent to [facility]. She is currently on  acetaminophen CR every eight hours 650 mg.  it could also be increased to four or five pills daily to be conservative. After a trial of the gabapentin, if she still requires narcotic, I would recommend scheduling 5 mg of Norco every eight hours.  Mobility:  She endorses that she does not get up alone. She also has oxygen which she uses , except when out of bed in the wheelchair.   Nutrition: She endorses a good appetite and her weight is staying stable.   Constipation:  She also endorses some constipation. I will increase her Senna to b.i.d. and if still, no BM it could be two b.i.d.  Follow up Palliative Care Visit: Palliative care will continue to follow for complex medical decision making, advance care planning, and clarification of goals. Return 2-4 weeks or prn.  I spent 25 minutes providing this consultation. More than 50% of the time in this consultation was spent in counseling and care coordination.  PPS: 30%  HOSPICE ELIGIBILITY/DIAGNOSIS: TBD  Chief Complaint: back pain  HISTORY OF PRESENT ILLNESS:  Amy Hensley is a 88 y.o. year old female  with osteoporosis, back pain, immobility, debility . Patient seen today to review palliative care needs to include medical decision making and advance care planning as appropriate.   History obtained from review of EMR, discussion with primary team, and interview with family, facility staff/caregiver and/or Amy Hensley.  I reviewed  available labs, medications, imaging, studies and related documents from the EMR.  Records reviewed and summarized above.   ROS  General: NAD ENMT: denies dysphagia Cardiovascular: denies chest pain, denies DOE Pulmonary: denies cough, denies increased SOB Abdomen: endorses good appetite, denies constipation, endorses continence of bowel GU: denies dysuria, endorses continence of urine MSK:  endorses  increased weakness,  no falls reported Skin: denies rashes or wounds Neurological: endorses   chronic  pain, denies insomnia Psych: Endorses positive mood  Physical Exam: Current and past weights: 130 lbs, 10 lb loss in 4 months. Constitutional: NAD General: frail appearing, WNWD EYES: anicteric sclera, lids intact, no discharge  ENMT: intact hearing, oral mucous membranes moist, dentition intact CV:no LE edema Pulmonary:  no increased work of breathing, no cough, room air Abdomen: intake 50%, soft and non tender, no ascites MSK: + sarcopenia, moves all extremities,  non ambulatory Skin: warm and dry, no rashes or wounds on visible skin Neuro:  + generalized weakness,  mild  cognitive impairment, non-anxious affect   Thank you for the opportunity to participate in the care of Amy Hensley.  The palliative care team will continue to follow. Please call our office at [phone] if we can be of additional assistance.   Jason Coop, NP DNP, AGPCNP-BC  COVID-19 PATIENT SCREENING TOOL Asked and negative response unless otherwise noted:   Have you had symptoms of covid, tested positive or been in contact with someone with symptoms/positive test in the past 5-10 days?

## 2022-01-25 ENCOUNTER — Ambulatory Visit: Payer: PPO | Admitting: Infectious Diseases

## 2022-02-05 DIAGNOSIS — R918 Other nonspecific abnormal finding of lung field: Secondary | ICD-10-CM | POA: Diagnosis not present

## 2022-02-05 DIAGNOSIS — U071 COVID-19: Secondary | ICD-10-CM | POA: Diagnosis not present

## 2022-02-05 DIAGNOSIS — J189 Pneumonia, unspecified organism: Secondary | ICD-10-CM | POA: Diagnosis not present

## 2022-02-06 ENCOUNTER — Other Ambulatory Visit: Payer: Self-pay

## 2022-02-06 ENCOUNTER — Inpatient Hospital Stay: Payer: PPO

## 2022-02-06 ENCOUNTER — Emergency Department: Payer: PPO

## 2022-02-06 ENCOUNTER — Encounter: Payer: Self-pay | Admitting: Emergency Medicine

## 2022-02-06 ENCOUNTER — Inpatient Hospital Stay
Admission: EM | Admit: 2022-02-06 | Discharge: 2022-02-12 | DRG: 871 | Disposition: A | Discharge status: Skilled Nursing Facility | Payer: PPO | Attending: Internal Medicine | Admitting: Internal Medicine

## 2022-02-06 DIAGNOSIS — I4719 Other supraventricular tachycardia: Secondary | ICD-10-CM | POA: Diagnosis not present

## 2022-02-06 DIAGNOSIS — J9621 Acute and chronic respiratory failure with hypoxia: Secondary | ICD-10-CM | POA: Diagnosis not present

## 2022-02-06 DIAGNOSIS — E039 Hypothyroidism, unspecified: Secondary | ICD-10-CM | POA: Diagnosis present

## 2022-02-06 DIAGNOSIS — E785 Hyperlipidemia, unspecified: Secondary | ICD-10-CM | POA: Diagnosis present

## 2022-02-06 DIAGNOSIS — Z87891 Personal history of nicotine dependence: Secondary | ICD-10-CM

## 2022-02-06 DIAGNOSIS — Z79899 Other long term (current) drug therapy: Secondary | ICD-10-CM | POA: Diagnosis not present

## 2022-02-06 DIAGNOSIS — E872 Acidosis, unspecified: Secondary | ICD-10-CM | POA: Diagnosis not present

## 2022-02-06 DIAGNOSIS — I130 Hypertensive heart and chronic kidney disease with heart failure and stage 1 through stage 4 chronic kidney disease, or unspecified chronic kidney disease: Secondary | ICD-10-CM | POA: Diagnosis present

## 2022-02-06 DIAGNOSIS — Z66 Do not resuscitate: Secondary | ICD-10-CM | POA: Diagnosis present

## 2022-02-06 DIAGNOSIS — R062 Wheezing: Secondary | ICD-10-CM | POA: Diagnosis not present

## 2022-02-06 DIAGNOSIS — Z79890 Hormone replacement therapy (postmenopausal): Secondary | ICD-10-CM | POA: Diagnosis not present

## 2022-02-06 DIAGNOSIS — A4189 Other specified sepsis: Secondary | ICD-10-CM | POA: Diagnosis not present

## 2022-02-06 DIAGNOSIS — N179 Acute kidney failure, unspecified: Secondary | ICD-10-CM | POA: Diagnosis not present

## 2022-02-06 DIAGNOSIS — M1990 Unspecified osteoarthritis, unspecified site: Secondary | ICD-10-CM | POA: Diagnosis present

## 2022-02-06 DIAGNOSIS — Z7189 Other specified counseling: Secondary | ICD-10-CM | POA: Diagnosis not present

## 2022-02-06 DIAGNOSIS — M810 Age-related osteoporosis without current pathological fracture: Secondary | ICD-10-CM | POA: Diagnosis present

## 2022-02-06 DIAGNOSIS — R6520 Severe sepsis without septic shock: Secondary | ICD-10-CM | POA: Diagnosis present

## 2022-02-06 DIAGNOSIS — K449 Diaphragmatic hernia without obstruction or gangrene: Secondary | ICD-10-CM | POA: Diagnosis not present

## 2022-02-06 DIAGNOSIS — Z7401 Bed confinement status: Secondary | ICD-10-CM

## 2022-02-06 DIAGNOSIS — M316 Other giant cell arteritis: Secondary | ICD-10-CM | POA: Diagnosis not present

## 2022-02-06 DIAGNOSIS — E876 Hypokalemia: Secondary | ICD-10-CM | POA: Diagnosis not present

## 2022-02-06 DIAGNOSIS — Z885 Allergy status to narcotic agent status: Secondary | ICD-10-CM

## 2022-02-06 DIAGNOSIS — I10 Essential (primary) hypertension: Secondary | ICD-10-CM | POA: Diagnosis present

## 2022-02-06 DIAGNOSIS — J9601 Acute respiratory failure with hypoxia: Principal | ICD-10-CM

## 2022-02-06 DIAGNOSIS — Z515 Encounter for palliative care: Secondary | ICD-10-CM | POA: Diagnosis not present

## 2022-02-06 DIAGNOSIS — W19XXXA Unspecified fall, initial encounter: Secondary | ICD-10-CM | POA: Diagnosis not present

## 2022-02-06 DIAGNOSIS — N1832 Chronic kidney disease, stage 3b: Secondary | ICD-10-CM | POA: Diagnosis present

## 2022-02-06 DIAGNOSIS — R54 Age-related physical debility: Secondary | ICD-10-CM | POA: Diagnosis present

## 2022-02-06 DIAGNOSIS — I471 Supraventricular tachycardia, unspecified: Secondary | ICD-10-CM | POA: Diagnosis present

## 2022-02-06 DIAGNOSIS — U071 COVID-19: Secondary | ICD-10-CM | POA: Diagnosis present

## 2022-02-06 DIAGNOSIS — J1282 Pneumonia due to coronavirus disease 2019: Secondary | ICD-10-CM | POA: Diagnosis not present

## 2022-02-06 DIAGNOSIS — J9690 Respiratory failure, unspecified, unspecified whether with hypoxia or hypercapnia: Secondary | ICD-10-CM | POA: Diagnosis not present

## 2022-02-06 DIAGNOSIS — A419 Sepsis, unspecified organism: Secondary | ICD-10-CM | POA: Diagnosis present

## 2022-02-06 DIAGNOSIS — R0602 Shortness of breath: Secondary | ICD-10-CM | POA: Diagnosis not present

## 2022-02-06 DIAGNOSIS — Z888 Allergy status to other drugs, medicaments and biological substances status: Secondary | ICD-10-CM

## 2022-02-06 DIAGNOSIS — R069 Unspecified abnormalities of breathing: Secondary | ICD-10-CM | POA: Diagnosis not present

## 2022-02-06 DIAGNOSIS — J690 Pneumonitis due to inhalation of food and vomit: Secondary | ICD-10-CM | POA: Diagnosis not present

## 2022-02-06 DIAGNOSIS — M5489 Other dorsalgia: Secondary | ICD-10-CM | POA: Diagnosis not present

## 2022-02-06 DIAGNOSIS — I5033 Acute on chronic diastolic (congestive) heart failure: Secondary | ICD-10-CM | POA: Diagnosis not present

## 2022-02-06 DIAGNOSIS — M6281 Muscle weakness (generalized): Secondary | ICD-10-CM | POA: Diagnosis not present

## 2022-02-06 DIAGNOSIS — K5900 Constipation, unspecified: Secondary | ICD-10-CM | POA: Diagnosis not present

## 2022-02-06 DIAGNOSIS — J159 Unspecified bacterial pneumonia: Secondary | ICD-10-CM | POA: Diagnosis present

## 2022-02-06 DIAGNOSIS — M549 Dorsalgia, unspecified: Secondary | ICD-10-CM | POA: Diagnosis not present

## 2022-02-06 DIAGNOSIS — N1831 Chronic kidney disease, stage 3a: Secondary | ICD-10-CM | POA: Diagnosis not present

## 2022-02-06 DIAGNOSIS — J439 Emphysema, unspecified: Secondary | ICD-10-CM | POA: Diagnosis not present

## 2022-02-06 LAB — COMPREHENSIVE METABOLIC PANEL
ALT: 9 U/L (ref 0–44)
AST: 28 U/L (ref 15–41)
Albumin: 2.3 g/dL — ABNORMAL LOW (ref 3.5–5.0)
Alkaline Phosphatase: 68 U/L (ref 38–126)
Anion gap: 11 (ref 5–15)
BUN: 22 mg/dL (ref 8–23)
CO2: 21 mmol/L — ABNORMAL LOW (ref 22–32)
Calcium: 8.7 mg/dL — ABNORMAL LOW (ref 8.9–10.3)
Chloride: 107 mmol/L (ref 98–111)
Creatinine, Ser: 1.35 mg/dL — ABNORMAL HIGH (ref 0.44–1.00)
GFR, Estimated: 38 mL/min — ABNORMAL LOW (ref >60)
Glucose, Bld: 97 mg/dL (ref 70–99)
Potassium: 3.5 mmol/L (ref 3.5–5.1)
Sodium: 139 mmol/L (ref 135–145)
Total Bilirubin: 0.5 mg/dL (ref 0.3–1.2)
Total Protein: 5.6 g/dL — ABNORMAL LOW (ref 6.5–8.1)

## 2022-02-06 LAB — CBC WITH DIFFERENTIAL/PLATELET
Abs Immature Granulocytes: 0.55 10*3/uL — ABNORMAL HIGH (ref 0.00–0.07)
Basophils Absolute: 0 10*3/uL (ref 0.0–0.1)
Basophils Relative: 0 %
Eosinophils Absolute: 0 10*3/uL (ref 0.0–0.5)
Eosinophils Relative: 0 %
HCT: 34.2 % — ABNORMAL LOW (ref 36.0–46.0)
Hemoglobin: 10.6 g/dL — ABNORMAL LOW (ref 12.0–15.0)
Immature Granulocytes: 5 %
Lymphocytes Relative: 6 %
Lymphs Abs: 0.7 10*3/uL (ref 0.7–4.0)
MCH: 28.4 pg (ref 26.0–34.0)
MCHC: 31 g/dL (ref 30.0–36.0)
MCV: 91.7 fL (ref 80.0–100.0)
Monocytes Absolute: 1 10*3/uL (ref 0.1–1.0)
Monocytes Relative: 9 %
Neutro Abs: 9.7 10*3/uL — ABNORMAL HIGH (ref 1.7–7.7)
Neutrophils Relative %: 80 %
Platelets: 328 10*3/uL (ref 150–400)
RBC: 3.73 MIL/uL — ABNORMAL LOW (ref 3.87–5.11)
RDW: 15.4 % (ref 11.5–15.5)
Smear Review: NORMAL
WBC: 12.3 10*3/uL — ABNORMAL HIGH (ref 4.0–10.5)
nRBC: 0 % (ref 0.0–0.2)

## 2022-02-06 LAB — URINALYSIS, COMPLETE (UACMP) WITH MICROSCOPIC
Bilirubin Urine: NEGATIVE
Glucose, UA: NEGATIVE mg/dL
Hgb urine dipstick: NEGATIVE
Ketones, ur: NEGATIVE mg/dL
Nitrite: NEGATIVE
Protein, ur: 30 mg/dL — AB
Specific Gravity, Urine: 1.018 (ref 1.005–1.030)
pH: 5 (ref 5.0–8.0)

## 2022-02-06 LAB — D-DIMER, QUANTITATIVE: D-Dimer, Quant: 1.39 ug/mL-FEU — ABNORMAL HIGH (ref 0.00–0.50)

## 2022-02-06 LAB — LACTIC ACID, PLASMA
Lactic Acid, Venous: 3.4 mmol/L (ref 0.5–1.9)
Lactic Acid, Venous: 3.1 mmol/L (ref 0.5–1.9)
Lactic Acid, Venous: 2.2 mmol/L (ref 0.5–1.9)

## 2022-02-06 LAB — TROPONIN I (HIGH SENSITIVITY): Troponin I (High Sensitivity): 45 ng/L — ABNORMAL HIGH (ref <18)

## 2022-02-06 LAB — BRAIN NATRIURETIC PEPTIDE: B Natriuretic Peptide: 318.9 pg/mL — ABNORMAL HIGH (ref 0.0–100.0)

## 2022-02-06 LAB — SARS CORONAVIRUS 2 BY RT PCR: SARS Coronavirus 2 by RT PCR: POSITIVE — AB

## 2022-02-06 LAB — PROCALCITONIN: Procalcitonin: 33.56 ng/mL

## 2022-02-06 LAB — STREP PNEUMONIAE URINARY ANTIGEN: Strep Pneumo Urinary Antigen: NEGATIVE

## 2022-02-06 MED ORDER — ONDANSETRON HCL 4 MG PO TABS: 4.0000 mg | ORAL_TABLET | Freq: Four times a day (QID) | ORAL | Status: DC | PRN

## 2022-02-06 MED ORDER — ALBUTEROL SULFATE (2.5 MG/3ML) 0.083% IN NEBU
2.5000 mg | INHALATION_SOLUTION | RESPIRATORY_TRACT | Status: DC | PRN
  Administered 2022-02-06: 2.5 mg via RESPIRATORY_TRACT
  Filled 2022-02-06: qty 3

## 2022-02-06 MED ORDER — LEVOTHYROXINE SODIUM 50 MCG PO TABS
50.0000 ug | ORAL_TABLET | Freq: Every day | ORAL | Status: DC
  Administered 2022-02-07 – 2022-02-12 (×6): 50 ug via ORAL
  Filled 2022-02-06 (×6): qty 1

## 2022-02-06 MED ORDER — FUROSEMIDE 10 MG/ML IJ SOLN
40.0000 mg | INTRAMUSCULAR | Status: AC
  Administered 2022-02-06: 40 mg via INTRAVENOUS
  Filled 2022-02-06: qty 4

## 2022-02-06 MED ORDER — PANTOPRAZOLE SODIUM 40 MG PO TBEC
40.0000 mg | DELAYED_RELEASE_TABLET | Freq: Every day | ORAL | Status: DC
  Administered 2022-02-07 – 2022-02-12 (×6): 40 mg via ORAL
  Filled 2022-02-06 (×6): qty 1

## 2022-02-06 MED ORDER — PREGABALIN 25 MG PO CAPS
25.0000 mg | ORAL_CAPSULE | Freq: Two times a day (BID) | ORAL | Status: DC
  Administered 2022-02-06 – 2022-02-12 (×12): 25 mg via ORAL
  Filled 2022-02-06 (×12): qty 1

## 2022-02-06 MED ORDER — CALCITONIN (SALMON) 200 UNIT/ACT NA SOLN
1.0000 | Freq: Every day | NASAL | Status: DC
  Administered 2022-02-07 – 2022-02-12 (×6): 1 via NASAL
  Filled 2022-02-06 (×2): qty 3.7

## 2022-02-06 MED ORDER — VANCOMYCIN VARIABLE DOSE PER UNSTABLE RENAL FUNCTION (PHARMACIST DOSING): Status: DC

## 2022-02-06 MED ORDER — ACETAMINOPHEN 650 MG RE SUPP: 650.0000 mg | Freq: Four times a day (QID) | RECTAL | Status: DC | PRN

## 2022-02-06 MED ORDER — ONDANSETRON HCL 4 MG/2ML IJ SOLN
4.0000 mg | Freq: Four times a day (QID) | INTRAMUSCULAR | Status: DC | PRN
  Administered 2022-02-07: 4 mg via INTRAVENOUS
  Filled 2022-02-06: qty 2

## 2022-02-06 MED ORDER — MORPHINE SULFATE (PF) 2 MG/ML IV SOLN
1.0000 mg | INTRAVENOUS | Status: DC | PRN
  Administered 2022-02-06: 1 mg via INTRAVENOUS
  Filled 2022-02-06: qty 1

## 2022-02-06 MED ORDER — SODIUM CHLORIDE 0.9 % IV SOLN: 2.0000 g | Freq: Once | INTRAVENOUS | Status: DC

## 2022-02-06 MED ORDER — SODIUM CHLORIDE 0.9 % IV SOLN
2.0000 g | Freq: Once | INTRAVENOUS | Status: AC
  Administered 2022-02-06: 2 g via INTRAVENOUS
  Filled 2022-02-06: qty 12.5

## 2022-02-06 MED ORDER — HEPARIN SODIUM (PORCINE) 5000 UNIT/ML IJ SOLN
5000.0000 [IU] | Freq: Three times a day (TID) | INTRAMUSCULAR | Status: DC
  Administered 2022-02-06 – 2022-02-07 (×2): 5000 [IU] via SUBCUTANEOUS
  Filled 2022-02-06 (×2): qty 1

## 2022-02-06 MED ORDER — HYDROCODONE-ACETAMINOPHEN 5-325 MG PO TABS
1.0000 | ORAL_TABLET | ORAL | Status: DC | PRN
  Administered 2022-02-07: 1 via ORAL
  Administered 2022-02-07 – 2022-02-09 (×11): 2 via ORAL
  Administered 2022-02-10 (×2): 1 via ORAL
  Administered 2022-02-10: 2 via ORAL
  Administered 2022-02-11 (×3): 1 via ORAL
  Administered 2022-02-12: 2 via ORAL
  Administered 2022-02-12 (×3): 1 via ORAL
  Filled 2022-02-06: qty 1
  Filled 2022-02-06: qty 2
  Filled 2022-02-06: qty 1
  Filled 2022-02-06 (×3): qty 2
  Filled 2022-02-06: qty 1
  Filled 2022-02-06: qty 2
  Filled 2022-02-06: qty 1
  Filled 2022-02-06 (×2): qty 2
  Filled 2022-02-06 (×3): qty 1
  Filled 2022-02-06: qty 2
  Filled 2022-02-06: qty 1
  Filled 2022-02-06: qty 2
  Filled 2022-02-06: qty 1
  Filled 2022-02-06 (×2): qty 2
  Filled 2022-02-06: qty 1
  Filled 2022-02-06 (×2): qty 2

## 2022-02-06 MED ORDER — LORATADINE 10 MG PO TABS
5.0000 mg | ORAL_TABLET | Freq: Every day | ORAL | Status: DC
  Administered 2022-02-06 – 2022-02-11 (×6): 5 mg via ORAL
  Filled 2022-02-06 (×6): qty 1

## 2022-02-06 MED ORDER — METHYLPREDNISOLONE SODIUM SUCC 125 MG IJ SOLR
80.0000 mg | Freq: Every day | INTRAMUSCULAR | Status: DC
  Administered 2022-02-06: 80 mg via INTRAVENOUS
  Filled 2022-02-06: qty 2

## 2022-02-06 MED ORDER — SODIUM CHLORIDE 0.9 % IV SOLN: 2.0000 g | INTRAVENOUS | Status: DC

## 2022-02-06 MED ORDER — MORPHINE SULFATE (PF) 2 MG/ML IV SOLN: 1.0000 mg | Freq: Four times a day (QID) | INTRAVENOUS | Status: DC | PRN

## 2022-02-06 MED ORDER — ALBUTEROL SULFATE (2.5 MG/3ML) 0.083% IN NEBU
2.5000 mg | INHALATION_SOLUTION | Freq: Four times a day (QID) | RESPIRATORY_TRACT | Status: DC
  Administered 2022-02-06: 2.5 mg via RESPIRATORY_TRACT
  Filled 2022-02-06: qty 3

## 2022-02-06 MED ORDER — SODIUM CHLORIDE 0.9 % IV BOLUS
1000.0000 mL | Freq: Once | INTRAVENOUS | Status: AC
  Administered 2022-02-06: 1000 mL via INTRAVENOUS

## 2022-02-06 MED ORDER — METRONIDAZOLE 500 MG/100ML IV SOLN
500.0000 mg | Freq: Two times a day (BID) | INTRAVENOUS | Status: DC
  Administered 2022-02-06 – 2022-02-07 (×2): 500 mg via INTRAVENOUS
  Filled 2022-02-06 (×2): qty 100

## 2022-02-06 MED ORDER — MOLNUPIRAVIR EUA 200MG CAPSULE
4.0000 | ORAL_CAPSULE | Freq: Two times a day (BID) | ORAL | Status: AC
  Administered 2022-02-07 – 2022-02-11 (×10): 800 mg via ORAL
  Filled 2022-02-06: qty 4

## 2022-02-06 MED ORDER — LACTATED RINGERS IV SOLN: INTRAVENOUS | Status: DC

## 2022-02-06 MED ORDER — VANCOMYCIN HCL IN DEXTROSE 1-5 GM/200ML-% IV SOLN
1000.0000 mg | Freq: Once | INTRAVENOUS | Status: AC
  Administered 2022-02-06: 1000 mg via INTRAVENOUS
  Filled 2022-02-06: qty 200

## 2022-02-06 MED ORDER — VANCOMYCIN HCL IN DEXTROSE 1-5 GM/200ML-% IV SOLN: 1000.0000 mg | Freq: Once | INTRAVENOUS | Status: DC

## 2022-02-06 MED ORDER — STERILE WATER FOR INJECTION IJ SOLN
INTRAMUSCULAR | Status: AC
  Filled 2022-02-06: qty 10

## 2022-02-06 MED ORDER — SIMETHICONE 80 MG PO CHEW
160.0000 mg | CHEWABLE_TABLET | Freq: Two times a day (BID) | ORAL | Status: DC
  Administered 2022-02-07 – 2022-02-12 (×9): 160 mg via ORAL
  Filled 2022-02-06 (×14): qty 2

## 2022-02-06 MED ORDER — IOHEXOL 350 MG/ML SOLN
60.0000 mL | Freq: Once | INTRAVENOUS | Status: AC | PRN
  Administered 2022-02-06: 60 mL via INTRAVENOUS

## 2022-02-06 MED ORDER — ACETAMINOPHEN 325 MG PO TABS
650.0000 mg | ORAL_TABLET | Freq: Four times a day (QID) | ORAL | Status: DC | PRN
  Administered 2022-02-12: 650 mg via ORAL
  Filled 2022-02-06 (×2): qty 2

## 2022-02-06 MED ORDER — CALCIUM CARBONATE ANTACID 500 MG PO CHEW: 3.0000 | CHEWABLE_TABLET | Freq: Three times a day (TID) | ORAL | Status: DC | PRN

## 2022-02-06 NOTE — Consult Note (Signed)
PHARMACY -  BRIEF ANTIBIOTIC NOTE   Pharmacy has received consult(s) for vancomycin and cefepime from an ED provider.  The patient's profile has been reviewed for ht/wt/allergies/indication/available labs.    One time order(s) placed for: Vancomycin 1g IV x1 in ED Cefepime 2g IV x1 in ED  Further antibiotics/pharmacy consults should be ordered by admitting physician if indicated.                       Thank you, Lorna Dibble 02/06/2022  4:39 PM

## 2022-02-06 NOTE — H&P (Addendum)
History and Physical    Amy Hensley DJS:[id number] DOB: 09/06/1932 DOA: 02/06/2022  PCP: Adin Hector, MD   Patient coming from: [facility] long term care I have personally briefly reviewed patient's old medical records in [place]  Chief Complaint: Respiratory failure  HPI: Amy Hensley is a 88 y.o. female with medical history significant of hypertension, hypothyroid, giant cell arteritis, hyperlipidemia, osteoporosis, chronic hypoxic respiratory failure who presents for evaluation from [facility] after being diagnosed with COVID over this past weekend, worsening shortness of breath associated with fatigue, lethargy, nausea, inability to tolerate p.o.  Patient was diagnosed with COVID few days prior to presentation to the ED however had not been started on treatment for reasons that are unclear.  On presentation in the ED patient has physical signs and symptoms consistent with severe sepsis, elevated lactic acid, leukocytosis, acute on chronic respiratory failure requiring 4 to 5 L of oxygen, elevated procalcitonin.  Patient is afebrile.  She has been started on broad-spectrum IV antibiotics.  Imaging concerning for pneumonia.  No other obvious source of infection.  Urinalysis pending at time of this note.  Patient is mentating clearly.  Appears fatigued.  Husband at bedside.  Patient answers all questions appropriately.  No acute neurologic changes.  Patient was started on broad-spectrum IV antibiotics and intravenous fluids in ED.  Hospitalist contacted for admission.  ED Course: On presentation patient was found to be overtly septic.  Presumed source is respiratory.  Chest x-ray with signs and symptoms concerning for infection.  Was given intravenous fluid bolus, broad-spectrum antibiotics.  Hospitalist contacted for admission.  Review of Systems: As per HPI otherwise 14 point review of systems negative.    Past Medical History:  Diagnosis Date   Arthritis     Chronic kidney disease    stage 3   Hypertension    Hypothyroidism     Past Surgical History:  Procedure Laterality Date   ABDOMINAL HYSTERECTOMY     ABDOMINAL SURGERY     tacked up uterus   APPENDECTOMY     ARTERY BIOPSY N/A 05/19/2021   Procedure: BIOPSY TEMPORAL ARTERY;  Surgeon: Herbert Pun, MD;  Location: [facility] ORS;  Service: General;  Laterality: N/A;   CATARACT EXTRACTION W/PHACO Left 10/04/2016   Procedure: CATARACT EXTRACTION PHACO AND INTRAOCULAR LENS PLACEMENT ([place]);  Surgeon: Eulogio Bear, MD;  Location: [facility] OR[facility];  Service: Ophthalmology;  Laterality: Left;  us02:17.8 ap%19.6 cde27.31 fluid lot # [id number]   CESAREAN SECTION     EYE SURGERY     INGUINAL HERNIA REPAIR Right 12/08/2018   Procedure: OPEN RIGHT HERNIA REPAIR INGUINAL ADULT;  Surgeon: Herbert Pun, MD;  Location: [facility] OR[facility];  Service: General;  Laterality: Right;   IR INJECT/THERA/INC NEEDLE/CATH/PLC EPI/LUMB/SAC W/IMG  09/26/2021   IR KYPHO EA ADDL LEVEL THORACIC OR LUMBAR  12/14/2021   IR KYPHO LUMBAR INC FX REDUCE BONE BX UNI/BIL CANNULATION INC/IMAGING  10/18/2021   IR KYPHO LUMBAR INC FX REDUCE BONE BX UNI/BIL CANNULATION INC/IMAGING  12/14/2021   IR RADIOLOGIST EVAL & MGMT  11/09/2021     reports that she has quit smoking. Her smoking use included cigarettes. She smoked an average of 1 pack per day. She has never used smokeless tobacco. She reports that she does not drink alcohol and does not use drugs.  Allergies  Allergen Reactions   Alendronate Other (See Comments)    Dyspepsia (indigestion)    Buspirone Other (See Comments)    sedation  Codeine     twitching    No family history of pneumonia, sepsis, COVID infection  Prior to Admission medications   Medication Sig Start Date End Date Taking? Authorizing Provider  acetaminophen (ACETAMINOPHEN 8 HOUR) 650 MG CR tablet Take 650 mg by mouth every 8 (eight) hours as needed for pain.   Yes [provider]  amLODipine  (NORVASC) 10 MG tablet Take 10 mg by mouth daily.   Yes [provider]  calcitonin, salmon, (MIACALCIN/FORTICAL) 200 UNIT/ACT nasal spray Place 1 spray into alternate nostrils daily.   Yes [provider]  calcium carbonate (TUMS EX) 750 MG chewable tablet Chew 2 tablets by mouth 3 (three) times daily as needed for heartburn.   Yes [provider]  Camphor-Menthol-Methyl Sal (SALONPAS) 3.05-19-08 % PTCH Apply 1 patch topically daily. (Apply to lower back)   Yes [provider]  cholecalciferol (VITAMIN D) 25 MCG (1000 UNIT) tablet Take 1,000 Units by mouth daily.   Yes [provider]  Dextromethorphan-Guaifenesin 60-1200 MG 12hr tablet Take 1 tablet by mouth every 12 (twelve) hours.   Yes [provider]  esomeprazole (NEXIUM) 20 MG capsule Take 20 mg by mouth every morning. 05/18/21 05/18/22 Yes [provider]  ipratropium-albuterol (DUONEB) 0.5-2.5 (3) MG/3ML SOLN Take 3 mLs by nebulization every 6 (six) hours. 02/05/22 02/12/22 Yes [provider]  levofloxacin (LEVAQUIN) 500 MG tablet Take 500 mg by mouth daily. 02/06/22 02/10/22 Yes [provider]  levothyroxine (SYNTHROID) 50 MCG tablet Take 50 mcg by mouth daily before breakfast.    Yes [provider]  loratadine (CLARITIN) 10 MG tablet Take 1 tablet (10 mg total) by mouth daily. Patient taking differently: Take 5 mg by mouth at bedtime. 11/07/21  Yes Nolberto Hanlon, MD  magnesium oxide (MAG-OX) 400 MG tablet Take 400 mg by mouth daily.   Yes [provider]  meclizine (ANTIVERT) 25 MG tablet Take 1 tablet (25 mg total) by mouth 3 (three) times daily. Patient taking differently: Take 12.5 mg by mouth 3 (three) times daily. 11/06/21  Yes Nolberto Hanlon, MD  metaxalone (SKELAXIN) 800 MG tablet Take 400 mg by mouth every 6 (six) hours as needed for muscle spasms.   Yes [provider]  ondansetron (ZOFRAN-ODT) 4 MG disintegrating tablet Take 4 mg by  mouth every 4 (four) hours as needed for nausea or vomiting.   Yes [provider]  oxyCODONE (OXY IR/ROXICODONE) 5 MG immediate release tablet Take 1 tablet (5 mg total) by mouth every 4 (four) hours as needed for severe pain. 12/05/21  Yes Wouk, Ailene Rud, MD  polyethylene glycol (MIRALAX / GLYCOLAX) 17 g packet Take 17 g by mouth daily.   Yes [provider]  pregabalin (LYRICA) 25 MG capsule Take 25 mg by mouth every 12 (twelve) hours.   Yes [provider]  sennosides-docusate sodium (SENOKOT-S) 8.6-50 MG tablet Take 1 tablet by mouth 2 (two) times daily.   Yes [provider]  Simethicone 80 MG TABS Take 160 mg by mouth 2 (two) times daily.   Yes [provider]  feeding supplement (ENSURE ENLIVE / ENSURE PLUS) LIQD Take 237 mLs by mouth 3 (three) times daily between meals. 11/06/21   Nolberto Hanlon, MD  molnupiravir EUA (LAGEVRIO) 200 mg CAPS capsule Take 2 capsules by mouth daily. 02/06/22 02/11/22  [provider]  predniSONE (DELTASONE) 2.5 MG tablet Take 6 tablets (15 mg total) by mouth daily with breakfast. Patient not taking: Reported on 02/06/2022  12/05/21   Wouk, Ailene Rud, MD  sodium chloride 0.45 % solution Inject 80 mL/hr into the vein as directed. (Infuse 1 liter every shift)    [provider]    Physical Exam: Vitals:   02/06/22 1531 02/06/22 1640 02/06/22 1700 02/06/22 1730  BP: (!) 143/63 117/64 108/70 (!) 111/52  Pulse: (!) 109 (!) 104 98 (!) 108  Resp: (!) 22 (!) 22 (!) 21 (!) 27  Temp: 98.3 F (36.8 C)     TempSrc: Oral     SpO2: 91% 94% 95% (!) 89%  Weight:      Height:        Vitals:   02/06/22 1531 02/06/22 1640 02/06/22 1700 02/06/22 1730  BP: (!) 143/63 117/64 108/70 (!) 111/52  Pulse: (!) 109 (!) 104 98 (!) 108  Resp: (!) 22 (!) 22 (!) 21 (!) 27  Temp: 98.3 F (36.8 C)     TempSrc: Oral     SpO2: 91% 94% 95% (!) 89%  Weight:      Height:       General: Appears fatigued, frail HEENT:  Normocephalic, atraumatic Neck, supple, trachea midline, no tenderness Heart: Tach cardia, regular rhythm, no murmurs Lungs: Diffusely rhonchorous breath sounds bilaterally.  Mildly tachypneic.  4 L Abdomen: Soft, nontender, nondistended, positive bowel sounds Extremities: Normal, atraumatic, no clubbing or cyanosis, normal muscle tone Skin: Pale dry with no obvious rashes or lesions Neurologic: Cranial nerves grossly intact, sensation intact, alert and oriented x3 Psychiatric: Normal affect   Labs on Admission: I have personally reviewed following labs and imaging studies  CBC: Recent Labs  Lab 02/06/22 1533  WBC 12.3*  NEUTROABS 9.7*  HGB 10.6*  HCT 34.2*  MCV 91.7  PLT 382   Basic Metabolic Panel: Recent Labs  Lab 02/06/22 1533  NA 139  K 3.5  CL 107  CO2 21*  GLUCOSE 97  BUN 22  CREATININE 1.35*  CALCIUM 8.7*   GFR: Estimated Creatinine Clearance: 24.3 mL/min (A) (by C-G formula based on SCr of 1.35 mg/dL (H)). Liver Function Tests: Recent Labs  Lab 02/06/22 1533  AST 28  ALT 9  ALKPHOS 68  BILITOT 0.5  PROT 5.6*  ALBUMIN 2.3*   No results for input(s): "LIPASE", "AMYLASE" in the last 168 hours. No results for input(s): "AMMONIA" in the last 168 hours. Coagulation Profile: No results for input(s): "INR", "PROTIME" in the last 168 hours. Cardiac Enzymes: No results for input(s): "CKTOTAL", "CKMB", "CKMBINDEX", "TROPONINI" in the last 168 hours. BNP (last 3 results) No results for input(s): "PROBNP" in the last 8760 hours. HbA1C: No results for input(s): "HGBA1C" in the last 72 hours. CBG: No results for input(s): "GLUCAP" in the last 168 hours. Lipid Profile: No results for input(s): "CHOL", "HDL", "LDLCALC", "TRIG", "CHOLHDL", "LDLDIRECT" in the last 72 hours. Thyroid Function Tests: No results for input(s): "TSH", "T4TOTAL", "FREET4", "T3FREE", "THYROIDAB" in the last 72 hours. Anemia Panel: No results for input(s): "VITAMINB12", "FOLATE",  "FERRITIN", "TIBC", "IRON", "RETICCTPCT" in the last 72 hours. Urine analysis:    Component Value Date/Time   COLORURINE STRAW (A) 11/30/2021 1037   APPEARANCEUR CLEAR (A) 11/30/2021 1037   LABSPEC 1.015 11/30/2021 1037   PHURINE 7.0 11/30/2021 1037   GLUCOSEU NEGATIVE 11/30/2021 1037   HGBUR NEGATIVE 11/30/2021 1037   BILIRUBINUR NEGATIVE 11/30/2021 1037   KETONESUR NEGATIVE 11/30/2021 1037   PROTEINUR NEGATIVE 11/30/2021 1037   NITRITE NEGATIVE 11/30/2021 1037   LEUKOCYTESUR NEGATIVE 11/30/2021 1037    Radiological Exams  on Admission: DG Chest Portable 1 View  Result Date: 02/06/2022 CLINICAL DATA:  Shortness of breath EXAM: PORTABLE CHEST 1 VIEW COMPARISON:  Chest x-ray 11/30/2021 FINDINGS: Cardiomediastinal silhouette is within normal limits. There are atherosclerotic calcifications of the aorta. There is a band of linear atelectasis or scarring in the left mid lung. There are minimal patchy and strandy opacities in the right mid lung, slightly increased from prior. There is scarring in the left lung base. There is no pleural effusion or pneumothorax. No acute fractures are seen. IMPRESSION: Increasing linear and patchy opacities in the right mid lung worrisome for infection. Recommend follow-up PA and lateral chest x-ray in 4-6 weeks to confirm resolution. Electronically Signed   By: Ronney Asters M.D.   On: 02/06/2022 16:44    EKG: Independently reviewed.  Sinus tachycardia versus atrial fibrillation  Assessment/Plan Principal Problem:   Severe sepsis (HCC)  Severe sepsis Suspected bacterial pneumonia Acute COVID infection Patient presented after 4 to 5 days of increasing shortness of breath, lethargy, weakness Suspected etiology COVID infection with concomitant bacterial coinfection Able to rule out other bacterial sources of infection at this time Markedly elevated procalcitonin Plan: Admit inpatient, progressive unit Broad-spectrum antibiotics vancomycin, cefepime and  Flagyl Molnupiravir for COVID treatment Intravenous steroids, Solu-Medrol 80 mg daily Blood and urine cultures, follow for pathogen identification and de-escalation of antibiotics Check strep pneumo and Legionella antigens Intravenous resuscitation fluids Monitor vitals and fever curve  Acute on chronic hypoxic respiratory failure Likely secondary to infection Patient's baseline is 3 L Requiring 4 to 5 L Plan: Scheduled and as needed bronchodilators Intravenous steroids, Solu-Medrol 80 mg daily Wean oxygen as tolerated Vitals per unit protocol  Intractable nausea vomiting Inability to tolerate p.o. intake Likely secondary to sepsis/infection Plan: Intravenous fluids as above As needed Zofran  Possible atrial fibrillation Possible A-fib noted on EKG Suspect this may be sinus tachycardia in setting of sepsis Plan: Telemetry monitoring for now Consider cardiology consult if indicated  Essential hypertension Hold home antihypertensives in the setting of severe sepsis  Neuropathic pain PTA pregabalin  Hypothyroid PTA Synthroid   DVT prophylaxis: SQ heparin Code Status: DNR, confirmed with patient and husband at bedside Family Communication: Husband at bedside Disposition Plan: Anticipate return to previous home environment Consults called: Palliative care Admission status: Inpatient, progressive unit   Sidney Ace MD [facility]   If 7PM-7AM, please contact night-coverage  02/06/2022, 5:54 PM

## 2022-02-06 NOTE — ED Provider Notes (Signed)
[facility] Provider Note    Event Date/Time   First MD Initiated Contact with Patient 02/06/22 1528     (approximate)   History   Shortness of Breath   HPI  Amy Hensley is a 88 y.o. female with past medical history of hypertension, chronic deconditioning, chronic respiratory failure on 3 L nasal cannula, here with generalized weakness, nausea, decreased appetite, and shortness of breath.  The patient reportedly was diagnosed with COVID-19 over the weekend.  She has had increasing cough, sputum production, and fatigue since then.  She has had nausea and decreased appetite.  She has had some slight increasing confusion as well.  No chest pain.  No abdominal pain.  She states she feels weak but otherwise denies any other complaints.  She was found to be hypoxic on her 3 L with EMS and was given breathing treatments as well as Solu-Medrol in route with mild improvement in her respiratory status.     Physical Exam   Triage Vital Signs: ED Triage Vitals  Enc Vitals Group     BP 02/06/22 1531 (!) 143/63     Pulse Rate 02/06/22 1531 (!) 109     Resp 02/06/22 1531 (!) 22     Temp 02/06/22 1531 98.3 F (36.8 C)     Temp Source 02/06/22 1531 Oral     SpO2 02/06/22 1531 91 %     Weight 02/06/22 1528 138 lb 0.1 oz (62.6 kg)     Height 02/06/22 1528 '5\' 3"'$  (1.6 m)     Head Circumference --      [facility] --      Pain Score 02/06/22 1527 0     Pain Loc --      Pain Edu? --      Excl. in [place]? --     Most recent vital signs: Vitals:   02/07/22 0014 02/07/22 0100  BP:  134/68  Pulse:  96  Resp:  19  Temp: (!) 97.4 F (36.3 C)   SpO2:  99%     General: Awake, no distress.  CV:  Good peripheral perfusion.  Tachycardic. Resp:  Tachypneic, diffuse rhonchi, particularly in the bilateral bases.  Diminished aeration. Abd:  No distention.  No tenderness. Other:  Mildly dry mucous membranes.   ED Results / Procedures / Treatments   Labs (all labs  ordered are listed, but only abnormal results are displayed) Labs Reviewed  SARS CORONAVIRUS 2 BY RT PCR - Abnormal; Notable for the following components:      Result Value   SARS Coronavirus 2 by RT PCR POSITIVE (*)    All other components within normal limits  CBC WITH DIFFERENTIAL/PLATELET - Abnormal; Notable for the following components:   WBC 12.3 (*)    RBC 3.73 (*)    Hemoglobin 10.6 (*)    HCT 34.2 (*)    Neutro Abs 9.7 (*)    Abs Immature Granulocytes 0.55 (*)    All other components within normal limits  COMPREHENSIVE METABOLIC PANEL - Abnormal; Notable for the following components:   CO2 21 (*)    Creatinine, Ser 1.35 (*)    Calcium 8.7 (*)    Total Protein 5.6 (*)    Albumin 2.3 (*)    GFR, Estimated 38 (*)    All other components within normal limits  BRAIN NATRIURETIC PEPTIDE - Abnormal; Notable for the following components:   B Natriuretic Peptide 318.9 (*)    All other components  within normal limits  LACTIC ACID, PLASMA - Abnormal; Notable for the following components:   Lactic Acid, Venous 3.4 (*)    All other components within normal limits  LACTIC ACID, PLASMA - Abnormal; Notable for the following components:   Lactic Acid, Venous 3.1 (*)    All other components within normal limits  D-DIMER, QUANTITATIVE - Abnormal; Notable for the following components:   D-Dimer, Quant 1.39 (*)    All other components within normal limits  LACTIC ACID, PLASMA - Abnormal; Notable for the following components:   Lactic Acid, Venous 2.2 (*)    All other components within normal limits  URINALYSIS, COMPLETE (UACMP) WITH MICROSCOPIC - Abnormal; Notable for the following components:   Color, Urine YELLOW (*)    APPearance HAZY (*)    Protein, ur 30 (*)    Leukocytes,Ua MODERATE (*)    Bacteria, UA RARE (*)    Non Squamous Epithelial PRESENT (*)    All other components within normal limits  TROPONIN I (HIGH SENSITIVITY) - Abnormal; Notable for the following components:    Troponin I (High Sensitivity) 45 (*)    All other components within normal limits  CULTURE, BLOOD (SINGLE)  CULTURE, BLOOD (SINGLE)  URINE CULTURE  SURGICAL PCR SCREEN  PROCALCITONIN  LACTIC ACID, PLASMA  STREP PNEUMONIAE URINARY ANTIGEN  PROCALCITONIN  COMPREHENSIVE METABOLIC PANEL  CBC  LEGIONELLA PNEUMOPHILA SEROGP 1 UR AG     EKG Sinus tachycardia, ventricular 111.  QRS 124, QTc 476.  No acute ST elevations or depressions.  Nonspecific T wave changes.   RADIOLOGY Chest x-ray: RML PNA   I also independently reviewed and agree with radiologist interpretations.   PROCEDURES:  Critical Care performed: Yes, see critical care procedure note(s)  .Critical Care  Performed by: Duffy Bruce, MD Authorized by: Duffy Bruce, MD   Critical care provider statement:    Critical care time (minutes):  30   Critical care time was exclusive of:  Separately billable procedures and treating other patients   Critical care was necessary to treat or prevent imminent or life-threatening deterioration of the following conditions:  Cardiac failure, circulatory failure and respiratory failure   Critical care was time spent personally by me on the following activities:  Development of treatment plan with patient or surrogate, discussions with consultants, evaluation of patient's response to treatment, examination of patient, ordering and review of laboratory studies, ordering and review of radiographic studies, ordering and performing treatments and interventions, pulse oximetry, re-evaluation of patient's condition and review of old [place] ED: Medications  molnupiravir EUA (LAGEVRIO) capsule 800 mg (800 mg Oral Given 02/07/22 0015)  calcitonin (salmon) (MIACALCIN/FORTICAL) nasal spray 1 spray (1 spray Alternating Nares Not Given 02/06/22 1843)  levothyroxine (SYNTHROID) tablet 50 mcg (has no administration in time range)  calcium carbonate (TUMS - dosed in mg  elemental calcium) chewable tablet 600 mg of elemental calcium (has no administration in time range)  pantoprazole (PROTONIX) EC tablet 40 mg (has no administration in time range)  simethicone (MYLICON) chewable tablet 160 mg (160 mg Oral Not Given 02/06/22 1933)  pregabalin (LYRICA) capsule 25 mg (25 mg Oral Given 02/06/22 2255)  loratadine (CLARITIN) tablet 5 mg (5 mg Oral Given 02/06/22 2254)  heparin injection 5,000 Units (5,000 Units Subcutaneous Given 02/06/22 2255)  metroNIDAZOLE (FLAGYL) IVPB 500 mg (0 mg Intravenous Stopped 02/06/22 1943)  acetaminophen (TYLENOL) tablet 650 mg (has no administration in time range)    Or  acetaminophen (  TYLENOL) suppository 650 mg (has no administration in time range)  HYDROcodone-acetaminophen (NORCO/VICODIN) 5-325 MG per tablet 1-2 tablet (1 tablet Oral Given 02/07/22 0029)  ondansetron (ZOFRAN) tablet 4 mg (has no administration in time range)    Or  ondansetron (ZOFRAN) injection 4 mg (has no administration in time range)  albuterol (PROVENTIL) (2.5 MG/3ML) 0.083% nebulizer solution 2.5 mg (2.5 mg Nebulization Given 02/06/22 1950)  albuterol (PROVENTIL) (2.5 MG/3ML) 0.083% nebulizer solution 2.5 mg (2.5 mg Nebulization Given 02/06/22 1808)  methylPREDNISolone sodium succinate (SOLU-MEDROL) 125 mg/2 mL injection 80 mg (80 mg Intravenous Given 02/06/22 1835)  vancomycin variable dose per unstable renal function (pharmacist dosing) (has no administration in time range)  ceFEPIme (MAXIPIME) 2 g in sodium chloride 0.9 % 100 mL IVPB (has no administration in time range)  sodium chloride 0.9 % bolus 1,000 mL (0 mLs Intravenous Stopped 02/06/22 1732)  sodium chloride 0.9 % bolus 1,000 mL (0 mLs Intravenous Stopped 02/06/22 1817)  vancomycin (VANCOCIN) IVPB 1000 mg/200 mL premix (0 mg Intravenous Stopped 02/06/22 1844)  ceFEPIme (MAXIPIME) 2 g in sodium chloride 0.9 % 100 mL IVPB (0 g Intravenous Stopped 02/06/22 1732)  furosemide (LASIX) injection 40 mg (40 mg  Intravenous Given 02/06/22 1835)  sterile water (preservative free) injection (  Given 02/06/22 1843)  iohexol (OMNIPAQUE) 350 MG/ML injection 60 mL (60 mLs Intravenous Contrast Given 02/06/22 2035)     IMPRESSION / MDM / ASSESSMENT AND PLAN / ED COURSE  I reviewed the triage vital signs and the nursing notes.                               The patient is on the cardiac monitor to evaluate for evidence of arrhythmia and/or significant heart rate changes.   Ddx:  Differential includes the following, with pertinent life- or limb-threatening emergencies considered:  COVID-19 with resp failure, superimposed bacterial PNA, CHF, ACS, PE, anemia  Patient's presentation is most consistent with acute presentation with potential threat to life or bodily function.  MDM:  88 yo F with h/o chronic hypoxia here with acute on chronic hypoxic resp failure 2/2 COVID-19. Concern for superimposed bacterial PNA as well. CXR shows RML PNA. Pt tachycardic, hypoxia above her baseline. Broad spectrum labs, IVF, and IV ABX started. Procal >30 c/w bacterial superinfection. Trop 45, BNP 318 lkely demand related with possible mild concomitant underlying CHF. LA 3.4 c/w severe sepsis. CMP with slight dehydration. CBC with WBC 12.3k. Pt is DNR. WIll admit to medicine.  Pt remains hypoxic, tacyhpneic. Placed on BIPAP. Will trial a diuretic as she appears to be HDS other than resp status at this time. Hospitalist aware.   MEDICATIONS GIVEN IN ED: Medications  molnupiravir EUA (LAGEVRIO) capsule 800 mg (800 mg Oral Given 02/07/22 0015)  calcitonin (salmon) (MIACALCIN/FORTICAL) nasal spray 1 spray (1 spray Alternating Nares Not Given 02/06/22 1843)  levothyroxine (SYNTHROID) tablet 50 mcg (has no administration in time range)  calcium carbonate (TUMS - dosed in mg elemental calcium) chewable tablet 600 mg of elemental calcium (has no administration in time range)  pantoprazole (PROTONIX) EC tablet 40 mg (has no  administration in time range)  simethicone (MYLICON) chewable tablet 160 mg (160 mg Oral Not Given 02/06/22 1933)  pregabalin (LYRICA) capsule 25 mg (25 mg Oral Given 02/06/22 2255)  loratadine (CLARITIN) tablet 5 mg (5 mg Oral Given 02/06/22 2254)  heparin injection 5,000 Units (5,000 Units Subcutaneous Given 02/06/22 2255)  metroNIDAZOLE (  FLAGYL) IVPB 500 mg (0 mg Intravenous Stopped 02/06/22 1943)  acetaminophen (TYLENOL) tablet 650 mg (has no administration in time range)    Or  acetaminophen (TYLENOL) suppository 650 mg (has no administration in time range)  HYDROcodone-acetaminophen (NORCO/VICODIN) 5-325 MG per tablet 1-2 tablet (1 tablet Oral Given 02/07/22 0029)  ondansetron (ZOFRAN) tablet 4 mg (has no administration in time range)    Or  ondansetron (ZOFRAN) injection 4 mg (has no administration in time range)  albuterol (PROVENTIL) (2.5 MG/3ML) 0.083% nebulizer solution 2.5 mg (2.5 mg Nebulization Given 02/06/22 1950)  albuterol (PROVENTIL) (2.5 MG/3ML) 0.083% nebulizer solution 2.5 mg (2.5 mg Nebulization Given 02/06/22 1808)  methylPREDNISolone sodium succinate (SOLU-MEDROL) 125 mg/2 mL injection 80 mg (80 mg Intravenous Given 02/06/22 1835)  vancomycin variable dose per unstable renal function (pharmacist dosing) (has no administration in time range)  ceFEPIme (MAXIPIME) 2 g in sodium chloride 0.9 % 100 mL IVPB (has no administration in time range)  sodium chloride 0.9 % bolus 1,000 mL (0 mLs Intravenous Stopped 02/06/22 1732)  sodium chloride 0.9 % bolus 1,000 mL (0 mLs Intravenous Stopped 02/06/22 1817)  vancomycin (VANCOCIN) IVPB 1000 mg/200 mL premix (0 mg Intravenous Stopped 02/06/22 1844)  ceFEPIme (MAXIPIME) 2 g in sodium chloride 0.9 % 100 mL IVPB (0 g Intravenous Stopped 02/06/22 1732)  furosemide (LASIX) injection 40 mg (40 mg Intravenous Given 02/06/22 1835)  sterile water (preservative free) injection (  Given 02/06/22 1843)  iohexol (OMNIPAQUE) 350 MG/ML injection 60 mL (60 mLs  Intravenous Contrast Given 02/06/22 2035)     Consults:  Hospitalist   EMR reviewed       FINAL CLINICAL IMPRESSION(S) / ED DIAGNOSES   Final diagnoses:  Acute respiratory failure with hypoxia ([place])  COVID-19     Rx / DC Orders   ED Discharge Orders     None        Note:  This document was prepared using Dragon voice recognition software and may include unintentional dictation errors.   Duffy Bruce, MD 02/07/22 [phone]

## 2022-02-06 NOTE — ED Triage Notes (Signed)
Patient to ED via [facility] from [facility] for SOB. Patient has been diagnosed with COVID- unsure when. Wears 3L at baseline- was initially 82% on those 3L per EMS. Patient received zofran, NaCl, solu-medrol, albuterol, and a dueo-neb.

## 2022-02-06 NOTE — ED Notes (Signed)
Respiratory Therapist called for transport to CT.

## 2022-02-06 NOTE — ED Notes (Signed)
Patient incontinent of urine. Linens and brief changed and pericare performed. Positioned for comfort.

## 2022-02-06 NOTE — ED Notes (Signed)
Lysbeth Galas, MD made aware of critical result. Lactic 3.4

## 2022-02-06 NOTE — ED Notes (Signed)
Patient requesting pain medication for chronic back pain. K.Foust, NP notified via secure chat.

## 2022-02-06 NOTE — ED Notes (Signed)
Patient placed on purewick at this time. 

## 2022-02-06 NOTE — Consult Note (Signed)
CODE SEPSIS - PHARMACY COMMUNICATION  **Broad Spectrum Antibiotics should be administered within 1 hour of Sepsis diagnosis**  Time Code Sepsis Called/Page Received: 1635  Antibiotics Ordered: 1645  Time of 1st antibiotic administration: [place] ,PharmD Clinical Pharmacist  02/06/2022  4:40 PM

## 2022-02-06 NOTE — ED Notes (Signed)
Priscella Mann, MD notified of patient's rapid breathing and O2 in mid 80's on 15L HFNC. Per MD patient to be placed on bi-pap. RT called at this time.

## 2022-02-06 NOTE — Consult Note (Signed)
Pharmacy Antibiotic Note  Amy Hensley is a 88 y.o. female admitted on 02/06/2022 with sepsis (unk. source).  Pharmacy has been consulted for vancomycin & cefepime dosing.  Plan: Scr is 1.35 (BL as low as 1.22 in 11/2021). Received vancomycin 1g IV x1 in ED (~'20mg'$ /kg).  Per current renal function maintenance dosing would be between q36h and q48h interval. Will assess if scheduled vs dose-by-level regimen is more appropriate based on AM creatinine. F/u MRSA PCR and cultures. Cefepime 2g IV x1 in ED; followed by Cefepime 2g IV q24h Pt also receiving Metronidazole '500mg'$  IV q12h  Height: '5\' 3"'$  (160 cm) Weight: 62.6 kg (138 lb 0.1 oz) IBW/kg (Calculated) : 52.4  Temp (24hrs), Avg:98.3 F (36.8 C), Min:98.3 F (36.8 C), Max:98.3 F (36.8 C)  Recent Labs  Lab 02/06/22 1533 02/06/22 1815  WBC 12.3*  --   CREATININE 1.35*  --   LATICACIDVEN 3.4* 3.1*    Estimated Creatinine Clearance: 24.3 mL/min (A) (by C-G formula based on SCr of 1.35 mg/dL (H)).    Allergies  Allergen Reactions   Alendronate Other (See Comments)    Dyspepsia (indigestion)    Buspirone Other (See Comments)    sedation   Codeine     twitching    Antimicrobials this admission: VAN/CFP/MTZ (9/26 >>    Dose adjustments this admission: CTM and adjust PRN. Scr is 1.35 (BL as low as 1.22 in 11/2021)  Microbiology results: 9/26 BCx: pending 9/26 UCx: pending  9/26 Covid: positive  9/26 MRSA PCR: Pending  Thank you for allowing pharmacy to be a part of this patient's care.  Lorna Dibble 02/06/2022 6:49 PM

## 2022-02-06 NOTE — Significant Event (Signed)
Notified by bedside RN that patients respiratory status had declined.  Increased HR, RR, oxygen demand  Suspect fluid overload in setting of covid infection/severe sepsis  Plan: Upgrade to SDU status STAT BIPAP Stop IVF 40 IV lasix 80 IV solumedrol STAT repeat CXR  Will need CTA thorax to r/o PE.  Will order but prioritize respiratory treatment first  Patient DNR.  High risk for further decompensation  Ralene Muskrat MD

## 2022-02-06 NOTE — ED Notes (Signed)
Patient having difficulty on 6L HFNC. Patient bumped up to 10L HFNC per RT.

## 2022-02-07 ENCOUNTER — Other Ambulatory Visit: Payer: Self-pay

## 2022-02-07 ENCOUNTER — Encounter: Payer: Self-pay | Admitting: Internal Medicine

## 2022-02-07 DIAGNOSIS — U071 COVID-19: Secondary | ICD-10-CM

## 2022-02-07 DIAGNOSIS — J9601 Acute respiratory failure with hypoxia: Secondary | ICD-10-CM | POA: Diagnosis not present

## 2022-02-07 DIAGNOSIS — Z515 Encounter for palliative care: Secondary | ICD-10-CM | POA: Diagnosis not present

## 2022-02-07 DIAGNOSIS — Z7189 Other specified counseling: Secondary | ICD-10-CM | POA: Diagnosis not present

## 2022-02-07 DIAGNOSIS — N1832 Chronic kidney disease, stage 3b: Secondary | ICD-10-CM

## 2022-02-07 DIAGNOSIS — A419 Sepsis, unspecified organism: Secondary | ICD-10-CM | POA: Diagnosis not present

## 2022-02-07 DIAGNOSIS — I5033 Acute on chronic diastolic (congestive) heart failure: Secondary | ICD-10-CM | POA: Diagnosis present

## 2022-02-07 DIAGNOSIS — Z66 Do not resuscitate: Secondary | ICD-10-CM

## 2022-02-07 LAB — LACTIC ACID, PLASMA: Lactic Acid, Venous: 1.2 mmol/L (ref 0.5–1.9)

## 2022-02-07 LAB — COMPREHENSIVE METABOLIC PANEL
ALT: 9 U/L (ref 0–44)
AST: 17 U/L (ref 15–41)
Albumin: 2.3 g/dL — ABNORMAL LOW (ref 3.5–5.0)
Alkaline Phosphatase: 66 U/L (ref 38–126)
Anion gap: 9 (ref 5–15)
BUN: 21 mg/dL (ref 8–23)
CO2: 22 mmol/L (ref 22–32)
Calcium: 8.4 mg/dL — ABNORMAL LOW (ref 8.9–10.3)
Chloride: 108 mmol/L (ref 98–111)
Creatinine, Ser: 1.05 mg/dL — ABNORMAL HIGH (ref 0.44–1.00)
GFR, Estimated: 51 mL/min — ABNORMAL LOW (ref >60)
Glucose, Bld: 118 mg/dL — ABNORMAL HIGH (ref 70–99)
Potassium: 3.2 mmol/L — ABNORMAL LOW (ref 3.5–5.1)
Sodium: 139 mmol/L (ref 135–145)
Total Bilirubin: 0.7 mg/dL (ref 0.3–1.2)
Total Protein: 5.6 g/dL — ABNORMAL LOW (ref 6.5–8.1)

## 2022-02-07 LAB — CBC
HCT: 30.9 % — ABNORMAL LOW (ref 36.0–46.0)
Hemoglobin: 9.9 g/dL — ABNORMAL LOW (ref 12.0–15.0)
MCH: 28.9 pg (ref 26.0–34.0)
MCHC: 32 g/dL (ref 30.0–36.0)
MCV: 90.1 fL (ref 80.0–100.0)
Platelets: 306 10*3/uL (ref 150–400)
RBC: 3.43 MIL/uL — ABNORMAL LOW (ref 3.87–5.11)
RDW: 15.4 % (ref 11.5–15.5)
WBC: 13.6 10*3/uL — ABNORMAL HIGH (ref 4.0–10.5)
nRBC: 0 % (ref 0.0–0.2)

## 2022-02-07 LAB — GLUCOSE, CAPILLARY: Glucose-Capillary: 144 mg/dL — ABNORMAL HIGH (ref 70–99)

## 2022-02-07 LAB — SURGICAL PCR SCREEN
MRSA, PCR: NEGATIVE
Staphylococcus aureus: NEGATIVE

## 2022-02-07 LAB — PROCALCITONIN: Procalcitonin: 28.96 ng/mL

## 2022-02-07 MED ORDER — CHLORHEXIDINE GLUCONATE CLOTH 2 % EX PADS
6.0000 | MEDICATED_PAD | Freq: Every day | CUTANEOUS | Status: DC
  Administered 2022-02-07 – 2022-02-12 (×6): 6 via TOPICAL

## 2022-02-07 MED ORDER — ALBUTEROL SULFATE HFA 108 (90 BASE) MCG/ACT IN AERS
1.0000 | INHALATION_SPRAY | Freq: Four times a day (QID) | RESPIRATORY_TRACT | Status: DC
  Administered 2022-02-07 – 2022-02-12 (×17): 1 via RESPIRATORY_TRACT
  Filled 2022-02-07: qty 6.7

## 2022-02-07 MED ORDER — POTASSIUM CHLORIDE CRYS ER 20 MEQ PO TBCR
40.0000 meq | EXTENDED_RELEASE_TABLET | Freq: Once | ORAL | Status: AC
  Administered 2022-02-07: 40 meq via ORAL
  Filled 2022-02-07: qty 2

## 2022-02-07 MED ORDER — FUROSEMIDE 10 MG/ML IJ SOLN
20.0000 mg | Freq: Two times a day (BID) | INTRAMUSCULAR | Status: DC
  Administered 2022-02-07 – 2022-02-09 (×4): 20 mg via INTRAVENOUS
  Filled 2022-02-07 (×4): qty 2

## 2022-02-07 MED ORDER — SODIUM CHLORIDE 0.9 % IV SOLN
2.0000 g | Freq: Two times a day (BID) | INTRAVENOUS | Status: DC
  Administered 2022-02-07 – 2022-02-09 (×5): 2 g via INTRAVENOUS
  Filled 2022-02-07 (×6): qty 12.5

## 2022-02-07 MED ORDER — SODIUM CHLORIDE 0.9 % IV SOLN: INTRAVENOUS | Status: DC | PRN

## 2022-02-07 MED ORDER — ENOXAPARIN SODIUM 40 MG/0.4ML IJ SOSY
40.0000 mg | PREFILLED_SYRINGE | Freq: Every day | INTRAMUSCULAR | Status: DC
  Administered 2022-02-07 – 2022-02-11 (×5): 40 mg via SUBCUTANEOUS
  Filled 2022-02-07 (×5): qty 0.4

## 2022-02-07 MED ORDER — ORAL CARE MOUTH RINSE: 15.0000 mL | OROMUCOSAL | Status: DC | PRN

## 2022-02-07 MED ORDER — ORAL CARE MOUTH RINSE
15.0000 mL | OROMUCOSAL | Status: DC
  Administered 2022-02-07 – 2022-02-12 (×13): 15 mL via OROMUCOSAL

## 2022-02-07 MED ORDER — SENNA 8.6 MG PO TABS
2.0000 | ORAL_TABLET | Freq: Every day | ORAL | Status: DC
  Administered 2022-02-07 – 2022-02-09 (×3): 17.2 mg via ORAL
  Filled 2022-02-07 (×4): qty 2

## 2022-02-07 MED ORDER — ALBUTEROL SULFATE HFA 108 (90 BASE) MCG/ACT IN AERS: 1.0000 | INHALATION_SPRAY | RESPIRATORY_TRACT | Status: DC | PRN

## 2022-02-07 MED ORDER — DEXAMETHASONE 4 MG PO TABS
6.0000 mg | ORAL_TABLET | Freq: Every day | ORAL | Status: DC
  Administered 2022-02-07 – 2022-02-12 (×6): 6 mg via ORAL
  Filled 2022-02-07 (×3): qty 2
  Filled 2022-02-07: qty 1
  Filled 2022-02-07 (×2): qty 2

## 2022-02-07 NOTE — Assessment & Plan Note (Addendum)
Following aggressive fluid resuscitation from hypotension.  With some diuresis, GFR down to 48, since then has improved and by day of discharge, creatinine 0.96 with GFR 57.

## 2022-02-07 NOTE — Hospital Course (Addendum)
88 year old female past medical history of giant cell arteritis, hypertension plus recent diagnosis of COVID few days prior who presented from skilled nursing facility on 9/26 worsening shortness of breath and acute on chronic respiratory failure requiring 4 to 5 L nasal cannula to have severe sepsis secondary to pneumonia.  Patient was admitted, several hours later, decompensated from respiratory standpoint requiring BiPAP and placed in ICU.  PE ruled out and since then, patient has been able to be weaned down to 5 L high flow nasal cannula. Antibiotics switched to Unasyn for aspiration pneumonia, speech therapy evaluation.  Patient treated with IV Lasix for acute on chronic diastolic congestive heart failure.  Felt to be stable for discharge by 10/2.

## 2022-02-07 NOTE — Consult Note (Signed)
Consultation Note Date: 02/07/2022   Patient Name: Amy Hensley  DOB: 1933-03-28  MRN: [id number]  Age / Sex: 88 y.o., female  PCP: Adin Hector, MD Referring Physician: Annita Brod, MD  Reason for Consultation: Establishing goals of care  HPI/Patient Profile: 88 y.o. female  with past medical history of hypertension, hypothyroid, giant cell arteritis, hyperlipidemia, osteoporosis, and chronic hypoxic respiratory failure  admitted on 02/06/2022 with COVID infection and worsening shortness of breath.  Patient with suspected bacterial pneumonia and severe sepsis.  Patient required BiPAP for short time following admission.  Now on nasal cannula.  PMT consulted to discuss goals of care.  Of note, patient followed by outpatient palliative care at her facility.  Clinical Assessment and Goals of Care: I have reviewed medical records including EPIC notes, labs and imaging, received report from RN, assessed the patient and then met with patient to discuss diagnosis prognosis, GOC, EOL wishes, disposition and options.  There is no family at bedside and I offered to call her spouse but she declines.  She tells me he has been well informed about her condition and she keeps him up-to-date.  He will be back to bedside later today.  I introduced Palliative Medicine as specialized medical care for people living with serious illness. It focuses on providing relief from the symptoms and stress of a serious illness. The goal is to improve quality of life for both the patient and the family.  She tells me of her recent decline even prior to COVID infection.  She had a kyphoplasty at the beginning of August and tells me she does not feel she has been doing well since.  She tells me some days she gets out of bed but some days she does not.  She tells me her appetite has been poor over the past month and she thinks it is all related to her back pain.   We  discussed patient's current illness and what it means in the larger context of patient's on-going co-morbidities.  Natural disease trajectory and expectations at EOL were discussed.  I attempted to elicit values and goals of care important to the patient.  Patient tells me she would like to continue with current treatment and return to her facility.  The difference between aggressive medical intervention and comfort care was considered in light of the patient's goals of care.   Advance directives, concepts specific to code status, artificial feeding and hydration, and rehospitalization were considered and discussed.  We reviewed her completed MOST form that lists DNR, limited medical interventions, and no feeding tube.  She confirms this remains correct.  She confirms if she were ever unable to make decisions for herself she would want her spouse to make decisions for her.  Discussed with patient the importance of continued conversation with family and the medical providers regarding overall plan of care and treatment options, ensuring decisions are within the context of the patients values and GOCs.    We discussed her symptom management.  She tells me she often has nausea and is feeling nauseated currently.  We discussed she has as needed Zofran ordered.  We will request those from nurse.  She tells me her pain in her back is controlled with regimen currently ordered.  She tells me she has not had a bowel movement in 4 days.  We discussed adding senna.  Constipation is an issue outpatient as well.  Patient plans to continue with outpatient palliative care when she is  discharged back to her facility.  Questions and concerns were addressed. The family was encouraged to call with questions or concerns.  Primary Decision Maker PATIENT    SUMMARY OF RECOMMENDATIONS   DNR/DNI confirmed Patient has MOST form on file that she confirms is accurate:DNR/limited medical interventions/no feeding  tube Patient would want her spouse to make medical decisions if she were ever unable Patient hopes to return to her previous facility with palliative care to follow Patient's goals are to improve from current COVID infection and then continue to improve in functional status at facility Pain controlled with current regimen Having some nausea, Zofran requested Some constipation, senna added  Code Status/Advance Care Planning: DNR  Discharge Planning: [place] for rehab with Palliative care service follow-up      Primary Diagnoses: Present on Admission:  Severe sepsis ([place])   I have reviewed the medical record, interviewed the patient and family, and examined the patient. The following aspects are pertinent.  Past Medical History:  Diagnosis Date   Arthritis    Chronic kidney disease    stage 3   Hypertension    Hypothyroidism    Social History   Socioeconomic History   Marital status: Married    Spouse name: Not on file   Number of children: Not on file   Years of education: Not on file   Highest education level: Not on file  Occupational History   Not on file  Tobacco Use   Smoking status: Former    Packs/day: 1.00    Types: Cigarettes   Smokeless tobacco: Never   Tobacco comments:    Quit 2022  Vaping Use   Vaping Use: Never used  Substance and Sexual Activity   Alcohol use: No   Drug use: No   Sexual activity: Not on file  Other Topics Concern   Not on file  Social History Narrative   Not on file   Social Determinants of Health   Financial Resource Strain: Not on file  Food Insecurity: No Food Insecurity (02/07/2022)   Hunger Vital Sign    Worried About Running Out of Food in the Last Year: Never true    Ran Out of Food in the Last Year: Never true  Transportation Needs: No Transportation Needs (02/07/2022)   PRAPARE - Hydrologist (Medical): No    Lack of Transportation (Non-Medical): No  Physical  Activity: Not on file  Stress: Not on file  Social Connections: Not on file   History reviewed. No pertinent family history. Scheduled Meds:  albuterol  1 puff Inhalation Q6H   calcitonin ([place])  1 spray Alternating Nares Daily   Chlorhexidine Gluconate Cloth  6 each Topical Q0600   dexamethasone  6 mg Oral Daily   enoxaparin (LOVENOX) injection  40 mg Subcutaneous QHS   levothyroxine  50 mcg Oral Q0600   loratadine  5 mg Oral QHS   molnupiravir EUA  4 capsule Oral BID   mouth rinse  15 mL Mouth Rinse 4 times per day   pantoprazole  40 mg Oral QAC breakfast   pregabalin  25 mg Oral Q12H   simethicone  160 mg Oral BID   Continuous Infusions:  sodium chloride 10 mL/hr at 02/07/22 0517   ceFEPime (MAXIPIME) IV 2 g (02/07/22 1119)   PRN Meds:.sodium chloride, acetaminophen **OR** acetaminophen, albuterol, calcium carbonate, HYDROcodone-acetaminophen, ondansetron **OR** ondansetron (ZOFRAN) IV, mouth rinse Allergies  Allergen Reactions   Alendronate Other (See Comments)  Dyspepsia (indigestion)    Buspirone Other (See Comments)    sedation   Codeine     twitching   Review of Systems  Constitutional:  Positive for activity change, appetite change and fatigue.  Neurological:  Positive for weakness.    Physical Exam Constitutional:      General: She is not in acute distress.    Appearance: She is ill-appearing.  Pulmonary:     Effort: Pulmonary effort is normal.  Skin:    General: Skin is warm and dry.  Neurological:     Mental Status: She is alert and oriented to person, place, and time.     Vital Signs: BP (!) 123/59   Pulse 95   Temp 98.1 F (36.7 C)   Resp 20   Ht _0  (1.6 m)   Wt 56.7 kg   SpO2 95%   BMI 22.14 kg/m  Pain Scale: 0-10 POSS *See Group Information*: 1-Acceptable,Awake and alert Pain Score: 0-No pain   SpO2: SpO2: 95 % O2 Device:SpO2: 95 % O2 Flow Rate: .O2 Flow Rate (L/min): 5 L/min  IO: Intake/output summary:  Intake/Output  Summary (Last 24 hours) at 02/07/2022 1408 Last data filed at 02/07/2022 5300 Gross per 24 hour  Intake 1217.81 ml  Output 500 ml  Net 717.81 ml    LBM: Last BM Date : 02/02/22 Baseline Weight: Weight: 62.6 kg Most recent weight: Weight: 56.7 kg     Palliative Assessment/Data:PPS 40%     *Please note that this is a verbal dictation therefore any spelling or grammatical errors are due to the "[place]" system interpretation.  Juel Burrow, DNP, AGNP-C Palliative Medicine Team [phone] Pager: [phone]

## 2022-02-07 NOTE — Assessment & Plan Note (Signed)
Continue Synthroid °

## 2022-02-07 NOTE — Progress Notes (Signed)
[facility] Progress Note  Patient: Amy Hensley    BWI:[id number]  DOA: 02/06/2022    Date of Service: the patient was seen and examined on 02/07/2022  Brief hospital course: 88 year old female past medical history of giant cell arteritis, hypertension plus recent diagnosis of COVID few days prior who presented from skilled nursing facility on 9/26 worsening shortness of breath and acute on chronic respiratory failure requiring 4 to 5 L nasal cannula to have severe sepsis secondary to pneumonia.  Patient was admitted, several hours later, decompensated from respiratory standpoint requiring BiPAP and placed in ICU.  PE ruled out and since then, patient has been able to be weaned down to 5 L high flow nasal cannula.  Assessment and Plan: Assessment and Plan: * Severe sepsis ([place]) secondary to bacterial pneumonia and COVID Meets criteria for severe sepsis on admission given lactic acidosis, developing hypotension, acute respiratory failure with tachycardia, tachypnea and leukocytosis.  Continue IV antibiotics, steroids and molnupiravir.  Follow procalcitonin.  Sepsis itself stabilized.  Acute respiratory failure with hypoxia ([facility]) Secondary to COVID, pneumonia and also some component of diastolic heart failure.  Continue molnupiravir, antibiotics and will start Lasix  Acute on chronic diastolic CHF (congestive heart failure) ([place]) On admission, BNP slightly elevated in the 300s although some of this may have been from generalized respiratory failure from [place].  However, now received aggressive fluid resuscitation after she became hypotensive.  Recheck BNP and will start Lasix  Chronic kidney disease, stage 3b ([place]) Following aggressive fluid resuscitation from hypotension.  Currently renal function better than baseline.  Will likely need to start diuresis soon.  Hypertension Medications on hold due to hypotension, Will restart slowly.  Hypothyroidism Continue  Synthroid     Consultants: Palliative care  Procedures: None  Antimicrobials: Molnupiravir 9/26-present IV cefepime 9/26-present IV vancomycin 9/26 only  Code Status: Full code   Subjective: Patient doing okay, breathing a little bit easier now  Objective: Noted mild tachycardia Vitals:   02/07/22 1100 02/07/22 1200  BP: 135/77 (!) 123/59  Pulse: (!) 102 95  Resp: 17 20  Temp: 98.1 F (36.7 C) 98.1 F (36.7 C)  SpO2: 93% 95%    Intake/Output Summary (Last 24 hours) at 02/07/2022 1441 Last data filed at 02/07/2022 0612 Gross per 24 hour  Intake 1217.81 ml  Output 500 ml  Net 717.81 ml   Filed Weights   02/06/22 1528 02/07/22 0100  Weight: 62.6 kg 56.7 kg   Body mass index is 22.14 kg/m.  Exam:  General: Alert and oriented x2, no acute distress HEENT: Normocephalic, atraumatic, mucous membranes are moist Cardiovascular: Regular rhythm, occasional ectopic beat, tachycardic Respiratory: Bilateral end expiratory Rales Abdomen: Soft, nontender, nondistended, positive bowel sounds in Musculoskeletal: Clubbing or cyanosis, trace pitting edema Skin: No visible skin breaks, tears or lesions Psychiatry: Appropriate, no evidence of psychoses and Neurology: No focal deficits although note, patient is bedbound  Data Reviewed: Creatinine 1.05 with GFR 51, procalcitonin of 29, down from 34.  Normal lactic acid level.  Disposition:  Status is: Inpatient Remains inpatient appropriate because:  -Treatment of infection -Diuresis -Improvement in oxygenation    Anticipated discharge date: 9/29  Family Communication: Husband at the bedside DVT Prophylaxis: enoxaparin (LOVENOX) injection 40 mg Start: 02/07/22 2200    Author: Annita Brod ,MD 02/07/2022 2:41 PM  To reach On-call, see care teams to locate the attending and reach out via [email or web address]. Between 7PM-7AM, please contact night-coverage If you still have difficulty reaching the attending  provider, please page the [facility] (Director on Call) for [facility] on amion for assistance.

## 2022-02-07 NOTE — ED Notes (Signed)
Report given to Wardell Honour, RN

## 2022-02-07 NOTE — Assessment & Plan Note (Addendum)
On admission, BNP slightly elevated in the 300s although some of this may have been from generalized respiratory failure from [place].  However, now received aggressive fluid resuscitation after she became hypotensive.  Started on IV Lasix and since then has diuresed almost 6 L and is almost -5 L deficient.  Felt to be back to baseline.

## 2022-02-07 NOTE — Assessment & Plan Note (Addendum)
Secondary to COVID, pneumonia and also some component of diastolic heart failure.  Has improved significantly.  Weaning down to baseline of 3 L

## 2022-02-07 NOTE — Assessment & Plan Note (Addendum)
Meets criteria for severe sepsis on admission given lactic acidosis, developing hypotension, acute respiratory failure with tachycardia, tachypnea and leukocytosis.  Completed molnupiravir.  IV antibiotics changed to p.o. and 3 more days of p.o. steroids.  By day of discharge, procalcitonin down to 0.81.  White blood cell count elevated, but this is more likely from steroids.  Pneumonia felt to be aspiration and patient treated with IV Levaquin changed over to p.o. Augmentin for 2 more days.

## 2022-02-07 NOTE — Progress Notes (Signed)
PHARMACY NOTE:  ANTIMICROBIAL RENAL DOSAGE ADJUSTMENT  Current antimicrobial regimen includes a mismatch between antimicrobial dosage and estimated renal function.  As per policy approved by the Pharmacy & Therapeutics and Medical Executive Committees, the antimicrobial dosage will be adjusted accordingly.  Current antimicrobial dosage: Cefepime 2 g IV q24h  Indication: Pneumonia  Renal Function:  Estimated Creatinine Clearance: 31.2 mL/min (A) (by C-G formula based on SCr of 1.05 mg/dL (H)).    Antimicrobial dosage has been changed to:  Cefepime 2 g IV q12h  Thank you for allowing pharmacy to be a part of this patient's care.  Benita Gutter, [facility] 02/07/2022 9:32 AM

## 2022-02-07 NOTE — Assessment & Plan Note (Addendum)
Medications initially held due to hypotension and restarted by discharge

## 2022-02-08 DIAGNOSIS — A419 Sepsis, unspecified organism: Secondary | ICD-10-CM | POA: Diagnosis not present

## 2022-02-08 DIAGNOSIS — Z7189 Other specified counseling: Secondary | ICD-10-CM | POA: Diagnosis not present

## 2022-02-08 DIAGNOSIS — Z515 Encounter for palliative care: Secondary | ICD-10-CM | POA: Diagnosis not present

## 2022-02-08 DIAGNOSIS — I5033 Acute on chronic diastolic (congestive) heart failure: Secondary | ICD-10-CM | POA: Diagnosis not present

## 2022-02-08 DIAGNOSIS — K5900 Constipation, unspecified: Secondary | ICD-10-CM

## 2022-02-08 DIAGNOSIS — U071 COVID-19: Secondary | ICD-10-CM | POA: Diagnosis not present

## 2022-02-08 DIAGNOSIS — J9601 Acute respiratory failure with hypoxia: Secondary | ICD-10-CM | POA: Diagnosis not present

## 2022-02-08 DIAGNOSIS — N1832 Chronic kidney disease, stage 3b: Secondary | ICD-10-CM | POA: Diagnosis not present

## 2022-02-08 LAB — BASIC METABOLIC PANEL
Anion gap: 9 (ref 5–15)
BUN: 28 mg/dL — ABNORMAL HIGH (ref 8–23)
CO2: 22 mmol/L (ref 22–32)
Calcium: 8.4 mg/dL — ABNORMAL LOW (ref 8.9–10.3)
Chloride: 109 mmol/L (ref 98–111)
Creatinine, Ser: 1.07 mg/dL — ABNORMAL HIGH (ref 0.44–1.00)
GFR, Estimated: 50 mL/min — ABNORMAL LOW (ref >60)
Glucose, Bld: 126 mg/dL — ABNORMAL HIGH (ref 70–99)
Potassium: 3.9 mmol/L (ref 3.5–5.1)
Sodium: 140 mmol/L (ref 135–145)

## 2022-02-08 LAB — CBC
HCT: 31.6 % — ABNORMAL LOW (ref 36.0–46.0)
Hemoglobin: 9.9 g/dL — ABNORMAL LOW (ref 12.0–15.0)
MCH: 28.7 pg (ref 26.0–34.0)
MCHC: 31.3 g/dL (ref 30.0–36.0)
MCV: 91.6 fL (ref 80.0–100.0)
Platelets: 334 10*3/uL (ref 150–400)
RBC: 3.45 MIL/uL — ABNORMAL LOW (ref 3.87–5.11)
RDW: 15.3 % (ref 11.5–15.5)
WBC: 17.7 10*3/uL — ABNORMAL HIGH (ref 4.0–10.5)
nRBC: 0 % (ref 0.0–0.2)

## 2022-02-08 LAB — C-REACTIVE PROTEIN: CRP: 19.8 mg/dL — ABNORMAL HIGH (ref <1.0)

## 2022-02-08 LAB — PROCALCITONIN: Procalcitonin: 18.24 ng/mL

## 2022-02-08 LAB — LEGIONELLA PNEUMOPHILA SEROGP 1 UR AG: L. pneumophila Serogp 1 Ur Ag: NEGATIVE

## 2022-02-08 MED ORDER — ADULT MULTIVITAMIN W/MINERALS CH
1.0000 | ORAL_TABLET | Freq: Every day | ORAL | Status: DC
  Administered 2022-02-08 – 2022-02-12 (×5): 1 via ORAL
  Filled 2022-02-08 (×5): qty 1

## 2022-02-08 MED ORDER — BOOST / RESOURCE BREEZE PO LIQD CUSTOM
1.0000 | Freq: Three times a day (TID) | ORAL | Status: DC
  Administered 2022-02-08 – 2022-02-12 (×12): 1 via ORAL

## 2022-02-08 MED ORDER — PROSOURCE PLUS PO LIQD
30.0000 mL | Freq: Two times a day (BID) | ORAL | Status: DC
  Administered 2022-02-08 – 2022-02-12 (×3): 30 mL via ORAL
  Filled 2022-02-08 (×9): qty 30

## 2022-02-08 NOTE — TOC Initial Note (Addendum)
Transition of Care [facility]) - Initial/Assessment Note    Patient Details  Name: Amy Hensley MRN: [id number] Date of Birth: 1932-06-20  Transition [facility]) CM/SW Contact:    Coralee Pesa, [place] Phone Number: 02/08/2022, 12:33 PM  Clinical Narrative:                 CSW confirmed with [facility] in [place] that pt is there long term and there are no barriers to her returning. They noted pt was covid positive prior to admitting to [facility], so she will likely not need a quarantine stay prior to DC. TOC will be available for further DC needs.  Expected Discharge Plan: Skilled Nursing Facility Barriers to Discharge: Continued Medical Work up   Patient Goals and CMS Choice        Expected Discharge Plan and Services Expected Discharge Plan: [place]       Living arrangements for the past 2 months: [place]                                      Prior Living Arrangements/Services Living arrangements for the past 2 months: [place] Lives with:: Facility Resident Patient language and need for interpreter reviewed:: Yes Do you feel safe going back to the place where you live?: Yes      Need for Family Participation in Patient Care: No (Comment) Care giver support system in place?: Yes (comment)   Criminal Activity/Legal Involvement Pertinent to Current Situation/Hospitalization: No - Comment as needed  Activities of Daily Living Home Assistive Devices/Equipment: Oxygen ADL Screening (condition at time of admission) Patient's cognitive ability adequate to safely complete daily activities?: Yes Is the patient deaf or have difficulty hearing?: No Does the patient have difficulty seeing, even when wearing glasses/contacts?: No Does the patient have difficulty concentrating, remembering, or making decisions?: No Patient able to express need for assistance with ADLs?: Yes Does the patient have difficulty dressing or bathing?:  Yes Independently performs ADLs?: No Communication: Independent Dressing (OT): Dependent Is this a change from baseline?: Pre-admission baseline Grooming: Dependent Is this a change from baseline?: Pre-admission baseline Feeding: Independent Bathing: Dependent Is this a change from baseline?: Pre-admission baseline Toileting: Dependent Is this a change from baseline?: Pre-admission baseline In/Out Bed: Dependent Is this a change from baseline?: Pre-admission baseline Walks in Home: Dependent Is this a change from baseline?: Pre-admission baseline Does the patient have difficulty walking or climbing stairs?: Yes Weakness of Legs: Both Weakness of Arms/Hands: None  Permission Sought/Granted                  Emotional Assessment       Orientation: : Oriented to Self, Oriented to Place, Oriented to Situation, Oriented to  Time Alcohol / Substance Use: Not Applicable Psych Involvement: No (comment)  Admission diagnosis:  Acute respiratory failure with hypoxia ([place]) [J96.01] Severe sepsis ([place]) [A41.9, R65.20] COVID-19 [U07.1] Patient Active Problem List   Diagnosis Date Noted   Acute on chronic diastolic CHF (congestive heart failure) ([place]) 02/07/2022   Severe sepsis ([place]) secondary to bacterial pneumonia and COVID 02/06/2022   Pressure injury of skin 12/01/2021   CAP (community acquired pneumonia) 11/30/2021   Compression fracture of body of thoracic vertebra ([place]) 11/30/2021   Acute respiratory failure with hypoxemia ([place]) 11/02/2021   Acute respiratory failure with hypoxia ([place]) 11/01/2021   Elevated troponin 11/01/2021   Compression  fracture of first lumbar vertebra ([place]) 10/16/2021   Lumbar radiculopathy 09/25/2021   Ambulatory dysfunction 09/25/2021   Chronic kidney disease, stage 3b ([place]) 09/25/2021   History of fall 08/2021 with sacral insufficiency fractures 09/25/2021   Giant cell arteritis ([place]) 09/25/2021   Hypertension    Hypothyroidism    Hepatitis C     Leukocytosis    Hepatitis C antibody positive in blood 06/01/2021   PCP:  Adin Hector, MD Pharmacy:   [place], [place] - [place] [address] [place] [address] Phone: [phone] Fax: [place] [address] Phone: [phone] Fax: [place], [place] [address] Phone: [phone] Fax: [phone]     Social Determinants of Health (SDOH) Interventions    Readmission Risk Interventions    12/01/2021    9:16 AM  Readmission Risk Prevention Plan  Transportation Screening Complete  PCP or Specialist Appt within 3-5 Days Complete  HRI or [place] Complete  Social Work Consult for [place] Planning/Counseling Complete  Palliative Care Screening Not Applicable  Medication Review Press photographer) Complete

## 2022-02-08 NOTE — Progress Notes (Signed)
Daily Progress Note   Patient Name: Amy Hensley       Date: 02/08/2022 DOB: November 18, 1932  Age: 88 y.o. MRN#: [id number] Attending Physician: Annita Brod, MD Primary Care Physician: Adin Hector, MD Admit Date: 02/06/2022  Reason for Consultation/Follow-up: Establishing goals of care  Subjective: Feels better today than yesterday, continues to improve  Length of Stay: 2  Current Medications: Scheduled Meds:   albuterol  1 puff Inhalation Q6H   calcitonin (salmon)  1 spray Alternating Nares Daily   Chlorhexidine Gluconate Cloth  6 each Topical Q0600   dexamethasone  6 mg Oral Daily   enoxaparin (LOVENOX) injection  40 mg Subcutaneous QHS   furosemide  20 mg Intravenous BID   levothyroxine  50 mcg Oral Q0600   loratadine  5 mg Oral QHS   molnupiravir EUA  4 capsule Oral BID   mouth rinse  15 mL Mouth Rinse 4 times per day   pantoprazole  40 mg Oral QAC breakfast   pregabalin  25 mg Oral Q12H   senna  2 tablet Oral QHS   simethicone  160 mg Oral BID    Continuous Infusions:  sodium chloride Stopped (02/07/22 1433)   ceFEPime (MAXIPIME) IV 2 g (02/08/22 0815)    PRN Meds: sodium chloride, acetaminophen **OR** acetaminophen, albuterol, calcium carbonate, HYDROcodone-acetaminophen, ondansetron **OR** ondansetron (ZOFRAN) IV, mouth rinse  Physical Exam Constitutional:      General: She is not in acute distress.    Appearance: She is ill-appearing.  Pulmonary:     Effort: Pulmonary effort is normal.  Skin:    General: Skin is warm and dry.  Neurological:     Mental Status: She is alert and oriented to person, place, and time.             Vital Signs: BP 129/72 (BP Location: Left Arm)   Pulse (!) 108   Temp 98.7 F (37.1 C)   Resp 16   Ht '5\' 3"'$  (1.6 m)   Wt 56.7 kg    SpO2 95%   BMI 22.14 kg/m  SpO2: SpO2: 95 % O2 Device: O2 Device: Nasal Cannula O2 Flow Rate: O2 Flow Rate (L/min): 5 L/min  Intake/output summary:  Intake/Output Summary (Last 24 hours) at 02/08/2022 1128 Last data filed at 02/08/2022 0302 Gross per 24 hour  Intake 223.85 ml  Output 400 ml  Net -176.15 ml   LBM: Last BM Date :  (pt is unsure) Baseline Weight: Weight: 62.6 kg Most recent weight: Weight: 56.7 kg       Palliative Assessment/Data: PPS 40%      Patient Active Problem List   Diagnosis Date Noted   Acute on chronic diastolic CHF (congestive heart failure) ([place]) 02/07/2022   Severe sepsis (HCC) secondary to bacterial pneumonia and COVID 02/06/2022   Pressure injury of skin 12/01/2021   CAP (community acquired pneumonia) 11/30/2021   Compression fracture of body of thoracic vertebra ([place]) 11/30/2021   Acute respiratory failure with hypoxemia ([place]) 11/02/2021   Acute respiratory failure with hypoxia ([place]) 11/01/2021   Elevated troponin 11/01/2021   Compression fracture of first lumbar vertebra ([place]) 10/16/2021   Lumbar radiculopathy 09/25/2021   Ambulatory dysfunction  09/25/2021   Chronic kidney disease, stage 3b ([place]) 09/25/2021   History of fall 08/2021 with sacral insufficiency fractures 09/25/2021   Giant cell arteritis ([place]) 09/25/2021   Hypertension    Hypothyroidism    Hepatitis C    Leukocytosis    Hepatitis C antibody positive in blood 06/01/2021    Palliative Care Assessment & Plan   HPI: 88 y.o. female  with past medical history of hypertension, hypothyroid, giant cell arteritis, hyperlipidemia, osteoporosis, and chronic hypoxic respiratory failure  admitted on 02/06/2022 with COVID infection and worsening shortness of breath.  Patient with suspected bacterial pneumonia and severe sepsis.  Patient required BiPAP for short time following admission.  Now on nasal cannula.  PMT consulted to discuss goals of care.   Of note, patient followed by  outpatient palliative care at her facility.  Assessment: Spouse at bedside today during evaluation.  Patient and spouse feel that she continues to improve.  They continue to report that their goals are to improve from current illness and return to SNF in hopes of improving functional baseline to the point that patient can return home.  Patient's spouse tell me about their marriage of greater than 83 years.  They have 3 children.  We reviewed patient's symptoms.  She tells me that Zofran was helpful yesterday and she has no nausea today.  She tells me her pain continues to be controlled with current pain medication.  She has not had a bowel movement yet, senna was just given late last night.  Recommendations/Plan: DNR/DNI confirmed Patient has MOST form on file that she confirms is accurate:DNR/limited medical interventions/no feeding tube Patient would want her spouse to make medical decisions if she were ever unable Patient hopes to return to her previous facility with palliative care to follow Patient's goals are to improve from current COVID infection and then continue to improve in functional status at facility Pain controlled with current regimen Nausea improved Continue senna nightly -consider changing to twice daily if continues to not have BM  Code Status: DNR  Discharge Planning: [place] for rehab with Palliative care service follow-up  Care plan was discussed with patient and spouse  Thank you for allowing the Palliative Medicine Team to assist in the care of this patient.  *Please note that this is a verbal dictation therefore any spelling or grammatical errors are due to the "[place] One" system interpretation.  Juel Burrow, DNP, [facility] Palliative Medicine Team Team Phone # [phone]  Pager [phone]

## 2022-02-08 NOTE — Progress Notes (Signed)
Initial Nutrition Assessment  DOCUMENTATION CODES:   Not applicable  INTERVENTION:   -Boost Breeze po TID, each supplement provides 250 kcal and 9 grams of protein  -30 ml Prosource Plus BID, each supplement provides 100 kcals and 15 grams protein -MVI with minerals daily -Liberalize diet to regular for widest variety of meal selections  NUTRITION DIAGNOSIS:   Increased nutrient needs related to acute illness (COVID-19) as evidenced by estimated needs.  GOAL:   Patient will meet greater than or equal to 90% of their needs  MONITOR:   PO intake, Supplement acceptance, Diet advancement  REASON FOR ASSESSMENT:   Malnutrition Screening Tool    ASSESSMENT:   Pt with past medical history of giant cell arteritis, hypertension plus recent diagnosis of COVID few days prior who presented with worsening shortness of breath and acute on chronic respiratory failure requiring 4 to 5 L nasal cannula to have severe sepsis secondary to pneumonia  Pt admitted with severe sepsis, bacterial pneumonia, and COVID-19.   Reviewed I/O's: -176 ml x 24 hours and +542 ml since admission  UOP: 400 ml x 24 hours  Spoke with pt and husband at bedside, who were pleasant and in good spirits today. Pt reports having decreased appetite over the past 3-4 months secondary to nausea. She explains that she feels hungry when ordering food, but food odors often exacerbate nausea. Pt husband has ordered some of her favorite foods (jello, soup, and mashed potatoes) at lunch and dinner. Pt shares that she has been living at [facility] and this has been going on since she was discharge there a few months ago. Pt has tried Ensure in the past, but does not like it.  Per pt, here UBW is around 132# and has lost to around 127#. Reviewed wt hx; pt experienced a 5.2% wt loss over the past 3 months. While this is not significant for time frame, it is concerning given pt's history of poor oral intake and advanced age.     Discussed importance of good meal and supplement intake to promote healing. Pt amenable to try Boost Breeze.   Medications reviewed and include decadron, lasix and senokot.   Labs reviewed: CBGS: 144.   NUTRITION - FOCUSED PHYSICAL EXAM:  Flowsheet Row Most Recent Value  Orbital Region No depletion  Upper Arm Region Mild depletion  Thoracic and Lumbar Region No depletion  Buccal Region No depletion  Temple Region No depletion  Clavicle Bone Region No depletion  Clavicle and Acromion Bone Region No depletion  Scapular Bone Region No depletion  Dorsal Hand Mild depletion  Patellar Region Mild depletion  Anterior Thigh Region Mild depletion  Posterior Calf Region Mild depletion  Edema (RD Assessment) None  Hair Reviewed  Eyes Reviewed  Mouth Reviewed  Skin Reviewed  Nails Reviewed       Diet Order:   Diet Order             Diet Heart Room service appropriate? Yes; Fluid consistency: Thin  Diet effective now                   EDUCATION NEEDS:   Education needs have been addressed  Skin:  Skin Assessment: Skin Integrity Issues: Skin Integrity Issues:: Other (Comment) Other: skin tear lt lower arm  Last BM:  Unknown  Height:   Ht Readings from Last 1 Encounters:  02/06/22 '5\' 3"'$  (1.6 m)    Weight:   Wt Readings from Last 1 Encounters:  02/07/22 56.7 kg  Ideal Body Weight:  52.3 kg  BMI:  Body mass index is 22.14 kg/m.  Estimated Nutritional Needs:   Kcal:  1700-1900  Protein:  85-100 grams  Fluid:  > 1.7 L    Loistine Chance, RD, LDN, [place] Registered Dietitian II Certified Diabetes Care and Education Specialist Please refer to [facility] for RD and/or RD on-call/weekend/after hours pager

## 2022-02-08 NOTE — Progress Notes (Signed)
[facility] Progress Note  Patient: Amy Hensley    LEX:[id number]  DOA: 02/06/2022    Date of Service: the patient was seen and examined on 02/08/2022  Brief hospital course: 88 year old female past medical history of giant cell arteritis, hypertension plus recent diagnosis of COVID few days prior who presented from skilled nursing facility on 9/26 worsening shortness of breath and acute on chronic respiratory failure requiring 4 to 5 L nasal cannula to have severe sepsis secondary to pneumonia.  Patient was admitted, several hours later, decompensated from respiratory standpoint requiring BiPAP and placed in ICU.  PE ruled out and since then, patient has been able to be weaned down to 5 L high flow nasal cannula.  Assessment and Plan: Assessment and Plan: * Severe sepsis ([place]) secondary to bacterial pneumonia and COVID Meets criteria for severe sepsis on admission given lactic acidosis, developing hypotension, acute respiratory failure with tachycardia, tachypnea and leukocytosis.  Continue IV antibiotics, steroids and molnupiravir.  Procalcitonin improving.  Sepsis itself stabilized.  CRP still moderately elevated at 19.  Continue to check daily.  Acute respiratory failure with hypoxia (HCC) Secondary to COVID, pneumonia and also some component of diastolic heart failure.  Continue molnupiravir, antibiotics and will start Lasix  Acute on chronic diastolic CHF (congestive heart failure) ([place]) On admission, BNP slightly elevated in the 300s although some of this may have been from generalized respiratory failure from [place].  However, now received aggressive fluid resuscitation after she became hypotensive.  Started on IV Lasix on evening of 9/27.  Has diuresed almost a liter.  Recheck BMP in the morning.  Chronic kidney disease, stage 3b ([place]) Following aggressive fluid resuscitation from hypotension.  Currently renal function better than baseline.  Keep close monitoring given  diuresis  Hypertension Medications on hold due to hypotension, Will restart slowly.  Hypothyroidism Continue Synthroid     Consultants: Palliative care  Procedures: None  Antimicrobials: Molnupiravir 9/26-present IV cefepime 9/26-present IV vancomycin 9/26 only  Code Status: Full code   Subjective: Patient feels like her breathing is little bit better.  Objective: Noted mild tachycardia Vitals:   02/08/22 0914 02/08/22 1257  BP: 129/72 127/68  Pulse: (!) 108 (!) 103  Resp: 16 17  Temp: 98.7 F (37.1 C) 98.8 F (37.1 C)  SpO2: 95% 95%    Intake/Output Summary (Last 24 hours) at 02/08/2022 1342 Last data filed at 02/08/2022 0302 Gross per 24 hour  Intake 223.85 ml  Output 400 ml  Net -176.15 ml    Filed Weights   02/06/22 1528 02/07/22 0100  Weight: 62.6 kg 56.7 kg   Body mass index is 22.14 kg/m.  Exam:  General: Alert and oriented x2, no acute distress HEENT: Normocephalic, atraumatic, mucous membranes are moist Cardiovascular: Regular rhythm, occasional ectopic beat, tachycardic Respiratory: Prolonged expiratory phase bilaterally Abdomen: Soft, nontender, nondistended, positive bowel sounds in Musculoskeletal: Clubbing or cyanosis, trace pitting edema Skin: No visible skin breaks, tears or lesions Psychiatry: Appropriate, no evidence of psychoses and Neurology: No focal deficits although note, patient is bedbound  Data Reviewed: Creatinine stable.  Procalcitonin continues to trend downward.  Disposition:  Status is: Inpatient Remains inpatient appropriate because:  -Treatment of infection -Diuresis -Improvement in oxygenation    Anticipated discharge date: 10/2 (patient from skilled nursing)  Family Communication: Husband at the bedside DVT Prophylaxis: enoxaparin (LOVENOX) injection 40 mg Start: 02/07/22 2200    Author: Annita Brod ,MD 02/08/2022 1:42 PM  To reach On-call, see care teams to locate  the attending and reach  out via [email or web address]. Between 7PM-7AM, please contact night-coverage If you still have difficulty reaching the attending provider, please page the [facility] (Director on Call) for [facility] on amion for assistance.

## 2022-02-09 DIAGNOSIS — I5033 Acute on chronic diastolic (congestive) heart failure: Secondary | ICD-10-CM | POA: Diagnosis not present

## 2022-02-09 DIAGNOSIS — E876 Hypokalemia: Secondary | ICD-10-CM | POA: Diagnosis present

## 2022-02-09 DIAGNOSIS — U071 COVID-19: Secondary | ICD-10-CM | POA: Diagnosis present

## 2022-02-09 DIAGNOSIS — J159 Unspecified bacterial pneumonia: Secondary | ICD-10-CM | POA: Diagnosis not present

## 2022-02-09 DIAGNOSIS — Z7189 Other specified counseling: Secondary | ICD-10-CM | POA: Diagnosis not present

## 2022-02-09 DIAGNOSIS — R6520 Severe sepsis without septic shock: Secondary | ICD-10-CM | POA: Diagnosis not present

## 2022-02-09 DIAGNOSIS — A419 Sepsis, unspecified organism: Secondary | ICD-10-CM | POA: Diagnosis not present

## 2022-02-09 DIAGNOSIS — J9601 Acute respiratory failure with hypoxia: Secondary | ICD-10-CM | POA: Diagnosis not present

## 2022-02-09 LAB — CBC
HCT: 33.5 % — ABNORMAL LOW (ref 36.0–46.0)
Hemoglobin: 10.7 g/dL — ABNORMAL LOW (ref 12.0–15.0)
MCH: 28.6 pg (ref 26.0–34.0)
MCHC: 31.9 g/dL (ref 30.0–36.0)
MCV: 89.6 fL (ref 80.0–100.0)
Platelets: 356 10*3/uL (ref 150–400)
RBC: 3.74 MIL/uL — ABNORMAL LOW (ref 3.87–5.11)
RDW: 15.4 % (ref 11.5–15.5)
WBC: 14.5 10*3/uL — ABNORMAL HIGH (ref 4.0–10.5)
nRBC: 0 % (ref 0.0–0.2)

## 2022-02-09 LAB — URINE CULTURE: Culture: 20000 — AB

## 2022-02-09 LAB — BASIC METABOLIC PANEL
Anion gap: 12 (ref 5–15)
BUN: 31 mg/dL — ABNORMAL HIGH (ref 8–23)
CO2: 19 mmol/L — ABNORMAL LOW (ref 22–32)
Calcium: 8.9 mg/dL (ref 8.9–10.3)
Chloride: 104 mmol/L (ref 98–111)
Creatinine, Ser: 1.08 mg/dL — ABNORMAL HIGH (ref 0.44–1.00)
GFR, Estimated: 50 mL/min — ABNORMAL LOW (ref >60)
Glucose, Bld: 102 mg/dL — ABNORMAL HIGH (ref 70–99)
Potassium: 3.7 mmol/L (ref 3.5–5.1)
Sodium: 135 mmol/L (ref 135–145)

## 2022-02-09 LAB — PROCALCITONIN: Procalcitonin: 7.37 ng/mL

## 2022-02-09 LAB — BRAIN NATRIURETIC PEPTIDE: B Natriuretic Peptide: 400.9 pg/mL — ABNORMAL HIGH (ref 0.0–100.0)

## 2022-02-09 MED ORDER — SODIUM BICARBONATE 650 MG PO TABS
650.0000 mg | ORAL_TABLET | Freq: Two times a day (BID) | ORAL | Status: DC
  Administered 2022-02-09 – 2022-02-10 (×4): 650 mg via ORAL
  Filled 2022-02-09 (×4): qty 1

## 2022-02-09 MED ORDER — SODIUM CHLORIDE 0.9 % IV SOLN
1.5000 g | Freq: Four times a day (QID) | INTRAVENOUS | Status: DC
  Administered 2022-02-09 – 2022-02-11 (×8): 1.5 g via INTRAVENOUS
  Filled 2022-02-09 (×10): qty 4

## 2022-02-09 MED ORDER — FUROSEMIDE 10 MG/ML IJ SOLN
40.0000 mg | Freq: Two times a day (BID) | INTRAMUSCULAR | Status: DC
  Administered 2022-02-09 – 2022-02-11 (×4): 40 mg via INTRAVENOUS
  Filled 2022-02-09 (×4): qty 4

## 2022-02-09 MED ORDER — METOPROLOL TARTRATE 5 MG/5ML IV SOLN
5.0000 mg | Freq: Once | INTRAVENOUS | Status: AC
  Administered 2022-02-10: 5 mg via INTRAVENOUS
  Filled 2022-02-09: qty 5

## 2022-02-09 NOTE — Progress Notes (Signed)
       CROSS COVER NOTE  NAME: Amy Hensley MRN: [id number] DOB : 1932-06-25    HPI/Events of Note   "tele called and said pt had a 36 beat run of SVT with HR up to 150"  Assessment and  Interventions   Assessment: Chart reviewed.  Patient has a history of CHF but not currently on beta-blockers.  Currently admitted with severe sepsis.  Home antihypertensives were on hold due to hypotension but BPs have been stable over the past 48 hours.  BP currently 154/94.  Potassium was normal this morning.  We will get potassium and magnesium for in the a.m.  Plan: We will give a dose of IV metoprolol 5 mg and continue to monitor throughout the night X X

## 2022-02-09 NOTE — Progress Notes (Addendum)
  Progress Note   Patient: Amy Hensley TUU:[id number] DOB: 21-Oct-1932 DOA: 02/06/2022     3 DOS: the patient was seen and examined on 02/09/2022   Brief hospital course: 88 year old female past medical history of giant cell arteritis, hypertension plus recent diagnosis of COVID few days prior who presented from skilled nursing facility on 9/26 worsening shortness of breath and acute on chronic respiratory failure requiring 4 to 5 L nasal cannula to have severe sepsis secondary to pneumonia.  Patient was admitted, several hours later, decompensated from respiratory standpoint requiring BiPAP and placed in ICU.  PE ruled out and since then, patient has been able to be weaned down to 5 L high flow nasal cannula. 9/29.  Antibiotics switched to Unasyn for aspiration pneumonia, speech therapy evaluation.  Increase IV Lasix for acute on chronic diastolic congestive heart failure.  Assessment and Pl Severe sepsis secondary to bacterial pneumonia and COVID  Acute on chronic respiratory failure with hypoxemia. Aspiration pneumonia of bilateral lower lobes Patient met severe sepsis criteria at the time of admission with tachycardia, tachypnea and leukocytosis.  Patient also had hypotension and acute respiratory failure. I have personally reviewed patient's chest CT scan, multilobe pneumonia.  Patient also appeared to have significant aspiration risk, she could not clear her upper airway. Antibiotic changed to Unasyn. Continue Molnupiravir.  Obtain speech therapy evaluation.  Acute on chronic diastolic congestive heart failure. Chronic kidney disease stage IIIb. Hypokalemia. Metabolic acidosis. Patient still have evidence of significant volume overload, she still short of breath, significant crackles in the base bilaterally.  I will increase furosemide to 40 mg IV twice a day. Monitor renal function.  Potassium has normalized.  Add sodium bicarbonate for mild metabolic acidosis.  Pyuria.  With  Enterococcus colonization. Patient does not have urinary symptoms, no UTI.  Essential hypertension. Blood pressure medicine on hold.  Prognosis. Due to advanced age, multiple medical problems, significant aspiration risk, patient long-term prognosis is poor.  Appreciate palliative care consult.      Subjective:  Patient still has significant shortness of breath.  Still on 5 L oxygen. Cough, nonproductive.  Physical Exam: Vitals:   02/08/22 2055 02/09/22 0011 02/09/22 0423 02/09/22 0737  BP: 126/78 130/76 124/65 135/83  Pulse: 95 95 80 98  Resp: $Remo'17 17 17 18  'GJFGO$ Temp: 98.5 F (36.9 C) 98.5 F (36.9 C) 98.5 F (36.9 C) 98.2 F (36.8 C)  TempSrc: Oral Oral Oral   SpO2: 95% 96% 95% 97%  Weight:      Height:       General exam: Appears calm and comfortable  Respiratory system: Significant crackles in the base.Marland Kitchen Respiratory effort normal. Cardiovascular system: S1 & S2 heard, RRR. No JVD, murmurs, rubs, gallops or clicks. No pedal edema. Gastrointestinal system: Abdomen is nondistended, soft and nontender. No organomegaly or masses felt. Normal bowel sounds heard. Central nervous system: Alert and oriented x2. No focal neurological deficits. Extremities: Symmetric 5 x 5 power. Skin: No rashes, lesions or ulcers Psychiatry: Mood & affect appropriate.   Data Reviewed:  Reviewed with chest CT results, reviewed all lab results.  Family Communication: Not able to reach son.   Disposition: Status is: Inpatient Remains inpatient appropriate because: Severity of disease,  Planned Discharge Destination: Skilled nursing facility    Time spent: 55 minutes  Author: Sharen Hones, MD 02/09/2022 12:00 PM  For on call review [email or web address].

## 2022-02-09 NOTE — Progress Notes (Signed)
Daily Progress Note   Patient Name: Amy Hensley       Date: 02/09/2022 DOB: [birth year]  Age: 88 y.o. MRN#: [id number] Attending Physician: Sharen Hones, MD Primary Care Physician: Adin Hector, MD Admit Date: 02/06/2022  Reason for Consultation/Follow-up: Establishing goals of care  Subjective: Feels about the same as yesterday; maybe slightly improved Had bowel movement this morning  Length of Stay: 3  Current Medications: Scheduled Meds:   (feeding supplement) PROSource Plus  30 mL Oral BID BM   albuterol  1 puff Inhalation Q6H   calcitonin (salmon)  1 spray Alternating Nares Daily   Chlorhexidine Gluconate Cloth  6 each Topical Q0600   dexamethasone  6 mg Oral Daily   enoxaparin (LOVENOX) injection  40 mg Subcutaneous QHS   feeding supplement  1 Container Oral TID BM   furosemide  40 mg Intravenous BID   levothyroxine  50 mcg Oral Q0600   loratadine  5 mg Oral QHS   molnupiravir EUA  4 capsule Oral BID   multivitamin with minerals  1 tablet Oral Daily   mouth rinse  15 mL Mouth Rinse 4 times per day   pantoprazole  40 mg Oral QAC breakfast   pregabalin  25 mg Oral Q12H   senna  2 tablet Oral QHS   simethicone  160 mg Oral BID   sodium bicarbonate  650 mg Oral BID    Continuous Infusions:  sodium chloride Stopped (02/07/22 1433)   ampicillin-sulbactam (UNASYN) IV      PRN Meds: sodium chloride, acetaminophen **OR** acetaminophen, albuterol, calcium carbonate, HYDROcodone-acetaminophen, ondansetron **OR** ondansetron (ZOFRAN) IV, mouth rinse  Physical Exam Constitutional:      General: She is not in acute distress.    Appearance: She is ill-appearing.  Pulmonary:     Effort: Pulmonary effort is normal.  Skin:    General: Skin is warm and dry.  Neurological:      Mental Status: She is alert and oriented to person, place, and time.             Vital Signs: BP (!) 146/78 (BP Location: Right Arm)   Pulse (!) 106   Temp 97.8 F (36.6 C)   Resp 16   Ht '5\' 3"'$  (1.6 m)   Wt 56.7 kg   SpO2 97%   BMI 22.14 kg/m  SpO2: SpO2: 97 % O2 Device: O2 Device: Nasal Cannula O2 Flow Rate: O2 Flow Rate (L/min): 5 L/min  Intake/output summary:  Intake/Output Summary (Last 24 hours) at 02/09/2022 1247 Last data filed at 02/09/2022 0400 Gross per 24 hour  Intake 440.06 ml  Output 400 ml  Net 40.06 ml    LBM: Last BM Date :  (pt is unsure) Baseline Weight: Weight: 62.6 kg Most recent weight: Weight: 56.7 kg       Palliative Assessment/Data: PPS 40%      Patient Active Problem List   Diagnosis Date Noted   Hypokalemia 02/09/2022   COVID-19 virus infection 02/09/2022   Bacterial pneumonia 02/09/2022   Acute on chronic diastolic CHF (congestive heart failure) ([place]) 02/07/2022   Severe sepsis (HCC) secondary to bacterial pneumonia and COVID 02/06/2022   Pressure injury of skin  12/01/2021   CAP (community acquired pneumonia) 11/30/2021   Compression fracture of body of thoracic vertebra ([place]) 11/30/2021   Acute respiratory failure with hypoxemia ([place]) 11/02/2021   Acute respiratory failure with hypoxia (HCC) 11/01/2021   Elevated troponin 11/01/2021   Compression fracture of first lumbar vertebra (HCC) 10/16/2021   Lumbar radiculopathy 09/25/2021   Ambulatory dysfunction 09/25/2021   Chronic kidney disease, stage 3b ([place]) 09/25/2021   History of fall 08/2021 with sacral insufficiency fractures 09/25/2021   Giant cell arteritis ([place]) 09/25/2021   Hypertension    Hypothyroidism    Hepatitis C    Leukocytosis    Hepatitis C antibody positive in blood 06/01/2021    Palliative Care Assessment & Plan   HPI: 88 y.o. female  with past medical history of hypertension, hypothyroid, giant cell arteritis, hyperlipidemia, osteoporosis, and chronic hypoxic  respiratory failure  admitted on 02/06/2022 with COVID infection and worsening shortness of breath.  Patient with suspected bacterial pneumonia and severe sepsis.  Patient required BiPAP for short time following admission.  Now on nasal cannula.  PMT consulted to discuss goals of care.   Of note, patient followed by outpatient palliative care at her facility.  Assessment: Spouse at bedside today during evaluation.  Patient and spouse feel that she continues to improve.  They continue to report that their goals are to improve from current illness and return to SNF in hopes of improving functional baseline to the point that patient can return home.  We reviewed patient's symptoms.  She tells me she has finally had a bowel movement.  No nausea today pain well controlled with current regimen.  Recommendations/Plan: DNR/DNI confirmed Patient has MOST form on file that she confirms is accurate:DNR/limited medical interventions/no feeding tube Patient would want her spouse to make medical decisions if she were ever unable Patient hopes to return to her previous facility with palliative care to follow Patient's goals are to improve from current COVID infection and then continue to improve in functional status at facility Pain controlled with current regimen Nausea improved Continue senna nightly -now having BMs  Code Status: DNR  Discharge Planning: [place] for rehab with Palliative care service follow-up  Care plan was discussed with patient and spouse  Thank you for allowing the Palliative Medicine Team to assist in the care of this patient.  *Please note that this is a verbal dictation therefore any spelling or grammatical errors are due to the "[place] One" system interpretation.  Juel Burrow, DNP, [facility] Palliative Medicine Team Team Phone # [phone]  Pager [phone]

## 2022-02-09 NOTE — TOC Progression Note (Addendum)
Transition of Care [facility]) - Progression Note    Patient Details  Name: Amy Hensley MRN: [id number] Date of Birth: [birth year]  Transition of Care [facility]) CM/SW [place], LCSW Phone Number: 02/09/2022, 9:39 AM  Clinical Narrative:    FL2 sent for continued long term care at [facility]. Per Palliative notes, patient will need to be followed by Palliative Care at [facility]. CSW reached out to Venia Carbon who confirmed they are active with patient for OP Palliative Care.    Expected Discharge Plan: [place] Barriers to Discharge: Continued Medical Work up  Expected Discharge Plan and Services Expected Discharge Plan: [place] arrangements for the past 2 months: [place]                                       Social Determinants of Health (SDOH) Interventions    Readmission Risk Interventions    12/01/2021    9:16 AM  Readmission Risk Prevention Plan  Transportation Screening Complete  PCP or Specialist Appt within 3-5 Days Complete  HRI or [place] Complete  Social Work Consult for [place] Planning/Counseling Complete  Palliative Care Screening Not Applicable  Medication Review Press photographer) Complete

## 2022-02-09 NOTE — NC FL2 (Signed)
[place] LEVEL OF CARE SCREENING TOOL     IDENTIFICATION  Patient Name: Amy Hensley Birthdate: Jan 06, 1933 Sex: female Admission Date (Current Location): 02/06/2022  [facility] and [place] Number:  Engineering geologist and Address:  [facility], [address] [place], [place] [address]      Provider Number: [id number]  Attending Physician Name and Address:  Sharen Hones, MD  Relative Name and Phone Number:  Jannette, Cotham (Spouse)   [phone] [facility])    Current Level of Care: Hospital Recommended Level of Care: [place] Prior Approval Number:    Date Approved/Denied:   PASRR Number: [id number]  Discharge Plan:      Current Diagnoses: Patient Active Problem List   Diagnosis Date Noted   Hypokalemia 02/09/2022   COVID-19 virus infection 02/09/2022   Bacterial pneumonia 02/09/2022   Acute on chronic diastolic CHF (congestive heart failure) ([place]) 02/07/2022   Severe sepsis (HCC) secondary to bacterial pneumonia and COVID 02/06/2022   Pressure injury of skin 12/01/2021   CAP (community acquired pneumonia) 11/30/2021   Compression fracture of body of thoracic vertebra ([place]) 11/30/2021   Acute respiratory failure with hypoxemia ([place]) 11/02/2021   Acute respiratory failure with hypoxia ([place]) 11/01/2021   Elevated troponin 11/01/2021   Compression fracture of first lumbar vertebra ([place]) 10/16/2021   Lumbar radiculopathy 09/25/2021   Ambulatory dysfunction 09/25/2021   Chronic kidney disease, stage 3b ([place]) 09/25/2021   History of fall 08/2021 with sacral insufficiency fractures 09/25/2021   Giant cell arteritis ([place]) 09/25/2021   Hypertension    Hypothyroidism    Hepatitis C    Leukocytosis    Hepatitis C antibody positive in blood 06/01/2021    Orientation RESPIRATION BLADDER Height & Weight     Self, Time, Situation, Place  O2 External catheter Weight: 125 lb (56.7 kg) Height:  '5\' 3"'$  (160 cm)  BEHAVIORAL  SYMPTOMS/MOOD NEUROLOGICAL BOWEL NUTRITION STATUS      Incontinent Diet (dys 3)  AMBULATORY STATUS COMMUNICATION OF NEEDS Skin     Verbally Skin abrasions, Bruising                       Personal Care Assistance Level of Assistance  Bathing, Feeding, Dressing Bathing Assistance: Maximum assistance Feeding assistance: Maximum assistance Dressing Assistance: Maximum assistance     Functional Limitations Info             SPECIAL CARE FACTORS FREQUENCY                       Contractures      Additional Factors Info  Code Status, Allergies Code Status Info: DNR Allergies Info: Alendronate, Buspirone, Codeine           Current Medications (02/09/2022):  This is the current hospital active medication list Current Facility-Administered Medications  Medication Dose Route Frequency Provider Last Rate Last Admin   (feeding supplement) PROSource Plus liquid 30 mL  30 mL Oral BID BM Gevena Barre K, MD   30 mL at 02/08/22 1659   0.9 %  sodium chloride infusion   Intravenous PRN Annita Brod, MD   Stopped at 02/07/22 1433   acetaminophen (TYLENOL) tablet 650 mg  650 mg Oral Q6H PRN Annita Brod, MD       Or   acetaminophen (TYLENOL) suppository 650 mg  650 mg Rectal Q6H PRN Annita Brod, MD       albuterol (VENTOLIN HFA) 108 (  90 Base) MCG/ACT inhaler 1 puff  1 puff Inhalation Q6H Annita Brod, MD   1 puff at 02/09/22 0818   albuterol (VENTOLIN HFA) 108 (90 Base) MCG/ACT inhaler 1 puff  1 puff Inhalation Q2H PRN Annita Brod, MD       calcitonin (salmon) (MIACALCIN/FORTICAL) nasal spray 1 spray  1 spray Alternating Nares Daily Annita Brod, MD   1 spray at 02/09/22 0818   calcium carbonate (TUMS - dosed in mg elemental calcium) chewable tablet 600 mg of elemental calcium  3 tablet Oral TID PRN Annita Brod, MD       ceFEPIme (MAXIPIME) 2 g in sodium chloride 0.9 % 100 mL IVPB  2 g Intravenous Q12H Annita Brod, MD 200 mL/hr  at 02/09/22 0824 2 g at 02/09/22 0824   Chlorhexidine Gluconate Cloth 2 % PADS 6 each  6 each Topical Q0600 Annita Brod, MD   6 each at 02/09/22 0818   dexamethasone (DECADRON) tablet 6 mg  6 mg Oral Daily Annita Brod, MD   6 mg at 02/09/22 0817   enoxaparin (LOVENOX) injection 40 mg  40 mg Subcutaneous QHS Annita Brod, MD   40 mg at 02/08/22 2213   feeding supplement (BOOST / RESOURCE BREEZE) liquid 1 Container  1 Container Oral TID BM Annita Brod, MD   1 Container at 02/09/22 0818   furosemide (LASIX) injection 40 mg  40 mg Intravenous BID Sharen Hones, MD       HYDROcodone-acetaminophen (NORCO/VICODIN) 5-325 MG per tablet 1-2 tablet  1-2 tablet Oral Q4H PRN Annita Brod, MD   2 tablet at 02/09/22 0817   levothyroxine (SYNTHROID) tablet 50 mcg  50 mcg Oral Q0600 Annita Brod, MD   50 mcg at 02/09/22 0426   loratadine (CLARITIN) tablet 5 mg  5 mg Oral QHS Annita Brod, MD   5 mg at 02/08/22 2213   molnupiravir EUA (LAGEVRIO) capsule 800 mg  4 capsule Oral BID Annita Brod, MD   800 mg at 02/09/22 0818   multivitamin with minerals tablet 1 tablet  1 tablet Oral Daily Annita Brod, MD   1 tablet at 02/09/22 0817   ondansetron (ZOFRAN) tablet 4 mg  4 mg Oral Q6H PRN Annita Brod, MD       Or   ondansetron [facility]) injection 4 mg  4 mg Intravenous Q6H PRN Annita Brod, MD   4 mg at 02/07/22 1430   Oral care mouth rinse  15 mL Mouth Rinse 4 times per day Annita Brod, MD   15 mL at 02/09/22 0818   Oral care mouth rinse  15 mL Mouth Rinse PRN Annita Brod, MD       pantoprazole (PROTONIX) EC tablet 40 mg  40 mg Oral QAC breakfast Annita Brod, MD   40 mg at 02/09/22 0817   pregabalin (LYRICA) capsule 25 mg  25 mg Oral Q12H Annita Brod, MD   25 mg at 02/09/22 0817   senna (SENOKOT) tablet 17.2 mg  2 tablet Oral QHS Annita Brod, MD   17.2 mg at 02/08/22 2213   simethicone (MYLICON) chewable tablet 160 mg   160 mg Oral BID Annita Brod, MD   160 mg at 02/08/22 2214     Discharge Medications: Please see discharge summary for a list of discharge medications.  Relevant Imaging Results:  Relevant Lab Results:   Additional Information SS #:  [place], LCSW

## 2022-02-09 NOTE — Evaluation (Signed)
Clinical/Bedside Swallow Evaluation Patient Details  Name: Amy Hensley MRN: [id number] Date of Birth: Apr 27, 1933  Today's Date: 02/09/2022 Time: SLP Start Time (ACUTE ONLY): 1205 SLP Stop Time (ACUTE ONLY): 1305 SLP Time Calculation (min) (ACUTE ONLY): 60 min  Past Medical History:  Past Medical History:  Diagnosis Date   Arthritis    Chronic kidney disease    stage 3   Hypertension    Hypothyroidism    Past Surgical History:  Past Surgical History:  Procedure Laterality Date   ABDOMINAL HYSTERECTOMY     ABDOMINAL SURGERY     tacked up uterus   APPENDECTOMY     ARTERY BIOPSY N/A 05/19/2021   Procedure: BIOPSY TEMPORAL ARTERY;  Surgeon: Herbert Pun, MD;  Location: [facility] ORS;  Service: General;  Laterality: N/A;   CATARACT EXTRACTION W/PHACO Left 10/04/2016   Procedure: CATARACT EXTRACTION PHACO AND INTRAOCULAR LENS PLACEMENT ([place]);  Surgeon: Eulogio Bear, MD;  Location: [facility] ORS;  Service: Ophthalmology;  Laterality: Left;  us02:17.8 ap%19.6 cde27.31 fluid lot # [id number]   CESAREAN SECTION     EYE SURGERY     INGUINAL HERNIA REPAIR Right 12/08/2018   Procedure: OPEN RIGHT HERNIA REPAIR INGUINAL ADULT;  Surgeon: Herbert Pun, MD;  Location: [facility] ORS;  Service: General;  Laterality: Right;   IR INJECT/THERA/INC NEEDLE/CATH/PLC EPI/LUMB/SAC W/IMG  09/26/2021   IR KYPHO EA ADDL LEVEL THORACIC OR LUMBAR  12/14/2021   IR KYPHO LUMBAR INC FX REDUCE BONE BX UNI/BIL CANNULATION INC/IMAGING  10/18/2021   IR KYPHO LUMBAR INC FX REDUCE BONE BX UNI/BIL CANNULATION INC/IMAGING  12/14/2021   IR RADIOLOGIST EVAL & MGMT  11/09/2021   HPI:  Pt  is a 88 y.o. female with medical history significant of hypertension, hypothyroid, giant cell arteritis, hyperlipidemia, osteoporosis, chronic hypoxic respiratory failure who [facility] after being diagnosed with COVID over this past weekend, worsening shortness of breath associated with fatigue, lethargy,  nausea, inability to tolerate p.o.  Patient was diagnosed with COVID few days prior to presentation to the ED however had not been started on treatment for reasons that are unclear.     On presentation in the ED patient has physical signs and symptoms consistent with severe sepsis, elevated lactic acid, leukocytosis, acute on chronic respiratory failure requiring 4 to 5 L of oxygen, elevated procalcitonin.   Previous Head CT: Periventricular white matter changes, likely the  sequela of chronic small vessel ischemic disease. Unchanged moderate  cerebral volume loss.     CT Of Chest: Emphysema; Partial consolidations and heterogeneous ground-glass densities  within the posterior upper lobes and the bilateral lower lobes  suspicious for aspiration or potential multifocal pneumonia  3. Moderate Hiatal Hernia with thickened appearance of the distal Esophagus and GE junction.     Assessment / Plan / Recommendation  Clinical Impression    Pt seen for BSE today. Pt A/O x3; min HOH. Seemed slightly distracted but redirected w/ cues. She engaged adequately. Husband present. Pt was able to help feed herself but stated she was fatigued. Congested cough at rest. COVID+  On [place] O2 support- 5L; afebrile, WBC declining.    OF NOTE: Pt endorses mild s/s of REFLUX at baseline. CT of Chest: "Moderate Hiatal Hernia with thickened appearance of the distal Esophagus and GE junction.". Pt and Husband unaware.     Pt appears to present w/ adequate oropharyngeal phase swallowing function w/ No overt oropharyngeal phase dysphagia appreciated during oral intake of trials at lunch meal; No  neuromuscular swallowing deficits appreciated. Pt appears at reduced risk for aspiration from an oropharyngeal phase standpoint following general aspiration precautions.  HOWEVER, pt has a baseline presentation of REFLUX s/s (early satiety, belching) and the Moderate Hiatal Hernia with thickened appearance of the distal Esophagus and GE  junction, per CT scan.  ANY Esophageal Dysmotility or Regurgitation of Reflux material can increase risk for aspiration of the Reflux material during Retrograde flow thus impact Pulmonary status. Pt described issues w/ mucous.    Pt sat upright in bed post given positioning support and consumed several trials of thin liquids Via Cup/Straw, purees, and soft solid foods w/ No immediate, overt clinical s/s of aspiration noted; clear vocal quality b/t trials, no decline in pulmonary status, no multiple swallows noted post initial pharyngeal swallow, no decline in O2 sats(96%). No immediate cough; congested cough similar to her Baseline PRIOR TO po's was noted x2. Oral phase appeared [facility] for bolus management and timely A-P transfer/clearing of material. Mastication appropriate for boluses. OM exam was [facility] for oral clearing; lingual/labial movements. No unilateral weakness. Speech clear.   Pt quickly c/o early satiety and did not want anything more; she consumed liquids, cream soup, and bites of banana and graham cracker given encouragement by this Clinician, though she did not want it.   Recommend remaining on a [place] for cut, moistened foods w/ thin liquids. Less straw use. General aspiration precautions. REFLUX precautions d/t the MOD Hiatal Hernia. Rest Breaks during meals/oral intake to allow for Esophageal clearing. HOB elevated at night when sleeping.  Recommend pt f/u w/ GI for further assessment/education as indicated. Discussion and handouts given on REFLUX, behaviors to manage REFLUX, Esophageal motility, and foods/diet. MD to reconsult ST services if any new needs while admitted. NSG updated. Pt and Husband appreciative of Education information. Palliative Care is following at Discharge.  SLP Visit Diagnosis: Dysphagia, unspecified (R13.10) (baseline MOD Hiatal Hernia; deconditioned status)    Aspiration Risk   (reduced following general aspiration precautions)    Diet Recommendation    Mech Soft diet for cut, moistened foods w/ thin liquids. Less straw use. General aspiration precautions. REFLUX precautions d/t the MOD Hiatal Hernia. Rest Breaks during meals/oral intake to allow for Esophageal clearing. HOB elevated at when resting/sleeping, especially right after meals.  Medication Administration: Whole meds with puree (for ease of clearing)    Other  Recommendations Recommended Consults: Consider GI evaluation;Consider esophageal assessment (for further education) Oral Care Recommendations: Oral care BID;Oral care before and after PO;Patient independent with oral care (setup) Other Recommendations:  (n/a)    Recommendations for follow up therapy are one component of a multi-disciplinary discharge planning process, led by the attending physician.  Recommendations may be updated based on patient status, additional functional criteria and insurance authorization.  Follow up Recommendations No SLP follow up      Assistance Recommended at Discharge Set up Supervision/Assistance (and more help at meals as needed)  Functional Status Assessment Patient has had a recent decline in their functional status and/or demonstrates limited ability to make significant improvements in function in a reasonable and predictable amount of time  Frequency and Duration  (n/a)   (n/a)       Prognosis Prognosis for Safe Diet Advancement: [place] Barriers to Reach Goals: Time post onset;Severity of deficits;Behavior;Motivation Barriers/Prognosis Comment: MOD Hiatal Hernia; baseline Esophageal dysmotility; advanced age; COVID+ and deconditioned/weak currently      Swallow Study   General Date of Onset: 02/06/22 HPI: Pt  is a  88 y.o. female with medical history significant of hypertension, hypothyroid, giant cell arteritis, hyperlipidemia, osteoporosis, chronic hypoxic respiratory failure who [facility] after being diagnosed with COVID over this past weekend,  worsening shortness of breath associated with fatigue, lethargy, nausea, inability to tolerate p.o.  Patient was diagnosed with COVID few days prior to presentation to the ED however had not been started on treatment for reasons that are unclear.     On presentation in the ED patient has physical signs and symptoms consistent with severe sepsis, elevated lactic acid, leukocytosis, acute on chronic respiratory failure requiring 4 to 5 L of oxygen, elevated procalcitonin.   Previous Head CT: Periventricular white matter changes, likely the  sequela of chronic small vessel ischemic disease. Unchanged moderate  cerebral volume loss.   CT Of Chest: mphysema; Partial consolidations and heterogeneous ground-glass densities  within the posterior upper lobes and the bilateral lower lobes  suspicious for aspiration or potential multifocal pneumonia  3. Moderate Hiatal Hernia with thickened appearance of the distal Esophagus and GE junction. Type of Study: Bedside Swallow Evaluation Previous Swallow Assessment: bse: 11/2021 Diet Prior to this Study: Dysphagia 3 (soft);Thin liquids (previous diet at SNF) Temperature Spikes Noted: No (wbc 14.5 declining) Respiratory Status: Nasal cannula (5L) History of Recent Intubation: No Behavior/Cognition: Alert;Cooperative;Pleasant mood;Distractible;Requires cueing (min) Oral Cavity Assessment: Within Functional Limits Oral Care Completed by SLP: Recent completion by staff Oral Cavity - Dentition: Adequate natural dentition (w/ Partial in place) Vision: Functional for self-feeding Self-Feeding Abilities: Needs assist;Needs set up;Able to feed self Patient Positioning: Upright in bed (needed positioning support for full upright sitting) Baseline Vocal Quality: Normal Volitional Cough: Congested Volitional Swallow: Able to elicit    Oral/Motor/Sensory Function Overall Oral Motor/Sensory Function: Within functional limits   Ice Chips Ice chips: Within functional  limits Presentation: Spoon (fed; 1 trial)   Thin Liquid Thin Liquid: Within functional limits Presentation: Cup;Self Fed;Straw (8-9 via Cup trials; 3-4 sips via straw) Other Comments: encouraged pt and Husband to monitor straw drinking for any coughing    Nectar Thick Nectar Thick Liquid: Not tested   Honey Thick Honey Thick Liquid: Not tested   Puree Puree: Within functional limits Presentation: Self Fed;Spoon (6 trials)   Solid     Solid: Within functional limits Presentation: Self Fed;Spoon (4 graham crackers; 2 banana) Other Comments: moistened trials         Orinda Kenner, MS, [place] Speech Language Pathologist Rehab Services; [place] [phone] (ascom) Octavian Godek 02/09/2022,2:20 PM

## 2022-02-09 NOTE — Progress Notes (Signed)
[place] [facility]) Hospital Liaison note:  This patient is currently enrolled in [facility] outpatient-based Palliative Care. Will continue to follow for disposition.  Please call with any outpatient palliative questions or concerns.  Thank you, Lorelee Market, LPN [facility] Liaison [phone]

## 2022-02-10 DIAGNOSIS — J9601 Acute respiratory failure with hypoxia: Secondary | ICD-10-CM | POA: Diagnosis not present

## 2022-02-10 DIAGNOSIS — I471 Supraventricular tachycardia, unspecified: Secondary | ICD-10-CM | POA: Diagnosis present

## 2022-02-10 DIAGNOSIS — A419 Sepsis, unspecified organism: Secondary | ICD-10-CM | POA: Diagnosis not present

## 2022-02-10 DIAGNOSIS — U071 COVID-19: Secondary | ICD-10-CM | POA: Diagnosis not present

## 2022-02-10 DIAGNOSIS — I5033 Acute on chronic diastolic (congestive) heart failure: Secondary | ICD-10-CM | POA: Diagnosis not present

## 2022-02-10 LAB — BASIC METABOLIC PANEL
Anion gap: 12 (ref 5–15)
BUN: 35 mg/dL — ABNORMAL HIGH (ref 8–23)
CO2: 21 mmol/L — ABNORMAL LOW (ref 22–32)
Calcium: 8.6 mg/dL — ABNORMAL LOW (ref 8.9–10.3)
Chloride: 103 mmol/L (ref 98–111)
Creatinine, Ser: 1.12 mg/dL — ABNORMAL HIGH (ref 0.44–1.00)
GFR, Estimated: 48 mL/min — ABNORMAL LOW (ref >60)
Glucose, Bld: 101 mg/dL — ABNORMAL HIGH (ref 70–99)
Potassium: 3.4 mmol/L — ABNORMAL LOW (ref 3.5–5.1)
Sodium: 136 mmol/L (ref 135–145)

## 2022-02-10 LAB — MAGNESIUM: Magnesium: 2 mg/dL (ref 1.7–2.4)

## 2022-02-10 MED ORDER — POTASSIUM CHLORIDE 20 MEQ PO PACK
40.0000 meq | PACK | Freq: Two times a day (BID) | ORAL | Status: AC
  Administered 2022-02-10 (×2): 40 meq via ORAL
  Filled 2022-02-10 (×2): qty 2

## 2022-02-10 NOTE — Evaluation (Signed)
Physical Therapy Evaluation Patient Details Name: Amy Hensley MRN: [id number] DOB: 04-Mar-1933 Today's Date: 02/10/2022  History of Present Illness  Pt is an 88 y.o. female presenting to hospital with SOB, generalized weakness, nausea, decreased appetite; diagnosed with COVID few days prior to presentation to ED.  Pt admitted with severe sepsis, suspected bacterial PNA, acute COVID infection, acute on chronic hypoxic respiratory failure, intractable N/V, possible a-fib.  Initially requiring BiPap but now on nasal cannula.  PMH includes htn, chronic deconditioning, chronic respiratory failure on 3 L [place], CKD, inguinal hernia repair, kyphoplasty, giant cell arteritis, and neuropathic pain.  Clinical Impression  PT/OT co-evaluation performed.  Prior to hospital admission, pt was most recently at [facility] facility (no recent PT/OT services); past couple of months requiring assist with stand/squat pivot transfers (appears to have been non-ambulatory past couple months per pt/husband description); has not stood/transferred in last 2 weeks; unable to sit up in chair longer than 10 minutes d/t back pain.  Pt agreeable to sitting on edge of bed but deferred further mobility d/t pt reporting needing back brace for any further mobility/activity (pt's husband plans to bring in pt's back brace).  Currently pt is max assist x2 with bed mobility; initially mod assist with sitting balance but progressed to close SBA (pt only able to tolerate sitting on edge of bed for a few minutes d/t back pain).  Pt would benefit from skilled PT to address noted impairments and functional limitations (see below for any additional details).  Upon hospital discharge, pt would benefit from continued physical therapy at LTC facility.    Recommendations for follow up therapy are one component of a multi-disciplinary discharge planning process, led by the attending physician.  Recommendations may be updated based on patient status, additional  functional criteria and insurance authorization.  Follow Up Recommendations Other (comment) (HHPT at [facility])      Assistance Recommended at Discharge Frequent or constant Supervision/Assistance  Patient can return home with the following  Two people to help with walking and/or transfers;Two people to help with bathing/dressing/bathroom;Assistance with cooking/housework;Direct supervision/assist for medications management;Assist for transportation    Equipment Recommendations Other (comment) (pt has needed DME at LTC facility)  Recommendations for Other Services  OT consult    Functional Status Assessment Patient has had a recent decline in their functional status and/or demonstrates limited ability to make significant improvements in function in a reasonable and predictable amount of time     Precautions / Restrictions Precautions Precautions: Fall Restrictions Weight Bearing Restrictions: No Other Position/Activity Restrictions: Per family, LSO brace for OOB      Mobility  Bed Mobility Overal bed mobility: Needs Assistance Bed Mobility: Supine to Sit, Sit to Supine     Supine to sit: Max assist, +2 for physical assistance, HOB elevated Sit to supine: Max assist, +2 for physical assistance   General bed mobility comments: assist for trunk and B LE's; use of bed sheet to assist with transfers    Transfers                   General transfer comment: deferred d/t poor sitting tolerance and no LSO brace present    Ambulation/Gait               General Gait Details: pt has been non-ambulatory recently  Stairs            Wheelchair Mobility    Modified Rankin (Stroke Patients Only)       Balance Overall  balance assessment: Needs assistance Sitting-balance support: Bilateral upper extremity supported, Feet supported Sitting balance-Leahy Scale: Fair Sitting balance - Comments: initially mod assist progressing to close SBA                                      Pertinent Vitals/Pain Pain Assessment Pain Assessment: 0-10 Pain Score: 7  Pain Location: back Pain Descriptors / Indicators: Constant, Aching, Discomfort, Dull Pain Intervention(s): Limited activity within patient's tolerance, Monitored during session, Repositioned, Patient requesting pain meds-RN notified HR 95-116 bpm and SpO2 sats in 90's on 5 L O2 via nasal cannula during sessions activities.    Home Living Family/patient expects to be discharged to:: Other (Comment)                   Additional Comments: [facility]    Prior Function Prior Level of Function : Needs assist       Physical Assist : Mobility (physical) Mobility (physical): Bed mobility;Transfers   Mobility Comments: Requires assist for all transfers (performs stand/squat pivot for past couple months); does not tolerate sitting in chair d/t back pain; has not stood/transferred in 2 weeks ADLs Comments: Assist for ADL's     Hand Dominance        Extremity/Trunk Assessment   Upper Extremity Assessment Upper Extremity Assessment: Defer to OT evaluation;Generalized weakness    Lower Extremity Assessment Lower Extremity Assessment: Generalized weakness (B DF AROM to just short of neutral; B DF PROM close to neutral; 2+/5 B hip flexion and knee flexion/extension)    Cervical / Trunk Assessment Cervical / Trunk Assessment: Other exceptions Cervical / Trunk Exceptions: forward head/shoulders  Communication   Communication: HOH  Cognition Arousal/Alertness: Awake/alert Behavior During Therapy: WFL for tasks assessed/performed Overall Cognitive Status: History of cognitive impairments - at baseline                                          General Comments Nursing cleared pt for participation in physical therapy.  Pt agreeable to PT session.    Exercises     Assessment/Plan    PT Assessment Patient needs continued PT services  PT Problem List  Decreased strength;Decreased activity tolerance;Decreased balance;Decreased mobility;Pain       PT Treatment Interventions DME instruction;Functional mobility training;Therapeutic activities;Therapeutic exercise;Balance training;Patient/family education    PT Goals (Current goals can be found in the Care Plan section)  Acute Rehab PT Goals Patient Stated Goal: to improve strength, mobility, and pain PT Goal Formulation: With patient/family Time For Goal Achievement: 02/23/22 Potential to Achieve Goals: Fair    Frequency Min 2X/week     Co-evaluation PT/OT/SLP Co-Evaluation/Treatment: Yes Reason for Co-Treatment: For patient/therapist safety;To address functional/ADL transfers PT goals addressed during session: Mobility/safety with mobility;Balance OT goals addressed during session: ADL's and self-care       AM-PAC PT "6 Clicks" Mobility  Outcome Measure Help needed turning from your back to your side while in a flat bed without using bedrails?: A Lot Help needed moving from lying on your back to sitting on the side of a flat bed without using bedrails?: Total Help needed moving to and from a bed to a chair (including a wheelchair)?: Total Help needed standing up from a chair using your arms (e.g., wheelchair or bedside chair)?: Total Help needed to walk  in hospital room?: Total Help needed climbing 3-5 steps with a railing? : Total 6 Click Score: 7    End of Session   Activity Tolerance: Patient limited by pain;Patient limited by fatigue Patient left: in bed;with call bell/phone within reach;with bed alarm set;with family/visitor present Nurse Communication: Mobility status;Precautions;Patient requests pain meds PT Visit Diagnosis: Muscle weakness (generalized) (M62.81);Other abnormalities of gait and mobility (R26.89);Pain    Time: 4033-5331 PT Time Calculation (min) (ACUTE ONLY): 22 min   Charges:   PT Evaluation $PT Eval Low Complexity: 1 Low          Kadan Millstein, PT 02/10/22, 4:13 PM

## 2022-02-10 NOTE — Evaluation (Signed)
Occupational Therapy Evaluation Patient Details Name: Amy Hensley MRN: [id number] DOB: 05/15/1932 Today's Date: 02/10/2022   History of Present Illness Amy Hensley is a 88 y.o. female with medical history significant of hypertension, hypothyroid, giant cell arteritis, hyperlipidemia, osteoporosis, chronic hypoxic respiratory failure, and recent kyphoplasty who presents [facility] evaluation from [facility] after being diagnosed with COVID over this past weekend, worsening shortness of breath associated with fatigue, lethargy, nausea, inability to tolerate p.o.   Clinical Impression   Amy Hensley was seen for OT/PT co-evaluation this date. Prior to hospital admission, pt was requiring assist for all transfers. Pt lives at [facility] facility. Pt presents to acute OT demonstrating impaired ADL performance and functional mobility 2/2 decreased activity tolerance and functional strength/balance deficits. Pt currently requires MAX A don B socks bed level. MAX A x2 sup<>sit. Pt requires BUE support static sitting, anticipate MIN A seated grooming tasks. Pt would benefit from trial of OT. Upon hospital discharge, recommend OT follow up at LTC to maximize pt safety and return to PLOF.    Recommendations for follow up therapy are one component of a multi-disciplinary discharge planning process, led by the attending physician.  Recommendations may be updated based on patient status, additional functional criteria and insurance authorization.   Follow Up Recommendations  Other (comment) (OT at LTC)    Assistance Recommended at Discharge Frequent or constant Supervision/Assistance  Patient can return home with the following Two people to help with walking and/or transfers;Two people to help with bathing/dressing/bathroom    Functional Status Assessment  Patient has had a recent decline in their functional status and demonstrates the ability to make significant improvements in function in a reasonable and  predictable amount of time.  Equipment Recommendations  Other (comment) (defer)    Recommendations for Other Services       Precautions / Restrictions Precautions Precautions: Fall Restrictions Weight Bearing Restrictions: No Other Position/Activity Restrictions: per family, LSO brace for OOB      Mobility Bed Mobility Overal bed mobility: Needs Assistance Bed Mobility: Supine to Sit, Sit to Supine     Supine to sit: Max assist, +2 for physical assistance, HOB elevated Sit to supine: Max assist, +2 for physical assistance        Transfers                   General transfer comment: poor sitting tolerance and no LSO brace present      Balance Overall balance assessment: Needs assistance Sitting-balance support: Bilateral upper extremity supported, Feet supported Sitting balance-Leahy Scale: Fair Sitting balance - Comments: initial MOD A improving to SBA                                   ADL either performed or assessed with clinical judgement   ADL Overall ADL's : Needs assistance/impaired                                       General ADL Comments: MAX A don B socks bed level. Pt requires BUE support static sitting, anticipate MIN A seated grooming tasks      Pertinent Vitals/Pain Pain Assessment Pain Assessment: 0-10 Pain Score: 7  Pain Location: back Pain Descriptors / Indicators: Aching, Discomfort, Dull Pain Intervention(s): Limited activity within patient's tolerance, Patient requesting pain meds-RN notified  Hand Dominance     Extremity/Trunk Assessment Upper Extremity Assessment Upper Extremity Assessment: Generalized weakness   Lower Extremity Assessment Lower Extremity Assessment: Generalized weakness       Communication Communication Communication: HOH   Cognition Arousal/Alertness: Awake/alert Behavior During Therapy: WFL for tasks assessed/performed Overall Cognitive Status: History of  cognitive impairments - at baseline                                       General Comments  SPO2 90s on 5L [place]            Home Living Family/patient expects to be discharged to:: Other (Comment)                                 Additional Comments: [facility]      Prior Functioning/Environment Prior Level of Function : Needs assist             Mobility Comments: Per family pt has not stood in 2 weeks, requires assit for all transfers, does not tolerate sitting 2/2 back pain          OT Problem List: Decreased strength;Decreased range of motion;Impaired balance (sitting and/or standing);Decreased activity tolerance;Pain      OT Treatment/Interventions: Self-care/ADL training;Therapeutic exercise;Energy conservation;DME and/or AE instruction;Therapeutic activities;Patient/family education;Balance training    OT Goals(Current goals can be found in the care plan section) Acute Rehab OT Goals Patient Stated Goal: to get therapy OT Goal Formulation: With patient/family Time For Goal Achievement: 02/24/22 Potential to Achieve Goals: Fair ADL Goals Pt Will Perform Grooming: with set-up;with supervision;sitting (will tolerate >8 mins sitting tasks) Pt Will Perform Lower Body Dressing: with mod assist;sitting/lateral leans Pt Will Transfer to Toilet: with mod assist;with +2 assist;squat pivot transfer;bedside commode Pt/caregiver will Perform Home Exercise Program: Increased strength;Increased ROM;Both right and left upper extremity  OT Frequency: Min 2X/week    Co-evaluation PT/OT/SLP Co-Evaluation/Treatment: Yes Reason for Co-Treatment: For patient/therapist safety;To address functional/ADL transfers PT goals addressed during session: Mobility/safety with mobility OT goals addressed during session: ADL's and self-care      AM-PAC OT "6 Clicks" Daily Activity     Outcome Measure Help from another person eating meals?: A Little Help from  another person taking care of personal grooming?: A Little Help from another person toileting, which includes using toliet, bedpan, or urinal?: A Lot Help from another person bathing (including washing, rinsing, drying)?: A Lot Help from another person to put on and taking off regular upper body clothing?: A Little Help from another person to put on and taking off regular lower body clothing?: A Lot 6 Click Score: 15   End of Session Nurse Communication: Patient requests pain meds  Activity Tolerance: Patient limited by pain Patient left: in bed;with call bell/phone within reach;with bed alarm set;with family/visitor present  OT Visit Diagnosis: Other abnormalities of gait and mobility (R26.89);Muscle weakness (generalized) (M62.81)                Time: 8325-4982 OT Time Calculation (min): 20 min Charges:  OT General Charges $OT Visit: 1 Visit OT Evaluation $OT Eval Moderate Complexity: 1 Mod  Dessie Coma, M.S. OTR/L  02/10/22, 1:23 PM  ascom [phone]

## 2022-02-10 NOTE — Progress Notes (Signed)
Progress Note   Patient: Amy Hensley MRN:[id number] DOB: 10/27/1932 DOA: 02/06/2022     4 DOS: the patient was seen and examined on 02/10/2022   Brief hospital course: 88-year-old female past medical history of giant cell arteritis, hypertension plus recent diagnosis of COVID few days prior who presented from skilled nursing facility on 9/26 worsening shortness of breath and acute on chronic respiratory failure requiring 4 to 5 L nasal cannula to have severe sepsis secondary to pneumonia.  Patient was admitted, several hours later, decompensated from respiratory standpoint requiring BiPAP and placed in ICU.  PE ruled out and since then, patient has been able to be weaned down to 5 L high flow nasal cannula. 9/29.  Antibiotics switched to Unasyn for aspiration pneumonia, speech therapy evaluation.  Increase IV Lasix for acute on chronic diastolic congestive heart failure.  Assessment and Plan: Severe sepsis secondary to bacterial pneumonia and COVID  Acute on chronic respiratory failure with hypoxemia. Aspiration pneumonia of bilateral lower lobes Patient met severe sepsis criteria at the time of admission with tachycardia, tachypnea and leukocytosis.  Patient also had hypotension and acute respiratory failure. I have personally reviewed patient's chest CT scan, multilobe pneumonia.  Patient also appeared to have significant aspiration risk, she could not clear her upper airway. Antibiotic changed to Unasyn. Continue Molnupiravir.  Patient is seen by speech therapy, aspiration could be from esophageal phase. Patient condition seem to be improving today, currently on 5 L oxygen, continue wean oxygen.  Continue Unasyn.   Acute on chronic diastolic congestive heart failure. Chronic kidney disease stage IIIb. Hypokalemia. Metabolic acidosis. Patient had evidence of significant volume overload, she still short of breath, significant crackles in the base bilaterally.   Patient still has signal  crackles in the bases, continue 1 more day IV Lasix.  Renal function still slightly worse today, continue monitor closely. Potassium 3.4, giving continue IV Lasix, will give 80 mEq oral potassium. Continue sodium Bicarb, bicarb level increased to 21 today.  PSVT. Patient had an episode of PSVT last night, was given 5 mg IV metoprolol, condition has improved.   Pyuria.  With Enterococcus colonization. Patient does not have urinary symptoms, no UTI.   Essential hypertension. Blood pressure medicine on hold.          Subjective:  Patient feels better, still have a cough, nonproductive.  Appear to be able to clear airway better today.   Physical Exam: Vitals:   02/10/22 0104 02/10/22 0628 02/10/22 0723 02/10/22 1130  BP: 125/74 (!) 145/79 138/79 (!) 138/95  Pulse: 90 84 88 (!) 116  Resp: 16 20 18   Temp: 97.7 F (36.5 C) 97.7 F (36.5 C) 98 F (36.7 C) (!) 97.4 F (36.3 C)  TempSrc: Oral Oral Oral Oral  SpO2: 100% 95% 96% 98%  Weight:      Height:       General exam: Appears calm and comfortable  Respiratory system: Crackles in the base.. Respiratory effort normal. Cardiovascular system: S1 & S2 heard, RRR. No JVD, murmurs, rubs, gallops or clicks. No pedal edema. Gastrointestinal system: Abdomen is nondistended, soft and nontender. No organomegaly or masses felt. Normal bowel sounds heard. Central nervous system: Alert and oriented x3. No focal neurological deficits. Extremities: Symmetric 5 x 5 power. Skin: No rashes, lesions or ulcers Psychiatry: Judgement and insight appear normal. Mood & affect appropriate.   Data Reviewed:  Lab results reviewed.  Family Communication: Husband updated at bedside.  Disposition: Status is: Inpatient Remains inpatient appropriate   because: Severity of disease, continue IV treatment.  Planned Discharge Destination: Home with Home Health    Time spent: 35 minutes  Author: Sharen Hones, MD 02/10/2022 11:47 AM  For on call  review [email or web address].

## 2022-02-10 NOTE — Progress Notes (Signed)
   02/10/22 1130  Vitals  Temp (!) 97.4 F (36.3 C)  Temp Source Oral  BP (!) 138/95  MAP (mmHg) 106  BP Method Automatic  Pulse Rate (!) 116  Pulse Rate Source Monitor  MEWS COLOR  MEWS Score Color Yellow  Oxygen Therapy  SpO2 98 %  MEWS Score  MEWS Temp 0  MEWS Systolic 0  MEWS Pulse 2  MEWS RR 0  MEWS LOC 0  MEWS Score 2  Note  Observations Pt's HR got elevated while working with PT/OT. Requested for pain meds. Pain meds will be administered after her PT/OT session. V/S will be rechecked.

## 2022-02-11 DIAGNOSIS — J9601 Acute respiratory failure with hypoxia: Secondary | ICD-10-CM | POA: Diagnosis not present

## 2022-02-11 DIAGNOSIS — R6520 Severe sepsis without septic shock: Secondary | ICD-10-CM | POA: Diagnosis not present

## 2022-02-11 DIAGNOSIS — I5033 Acute on chronic diastolic (congestive) heart failure: Secondary | ICD-10-CM | POA: Diagnosis not present

## 2022-02-11 DIAGNOSIS — A419 Sepsis, unspecified organism: Secondary | ICD-10-CM | POA: Diagnosis not present

## 2022-02-11 LAB — BASIC METABOLIC PANEL
Anion gap: 11 (ref 5–15)
BUN: 37 mg/dL — ABNORMAL HIGH (ref 8–23)
CO2: 24 mmol/L (ref 22–32)
Calcium: 9 mg/dL (ref 8.9–10.3)
Chloride: 105 mmol/L (ref 98–111)
Creatinine, Ser: 1.09 mg/dL — ABNORMAL HIGH (ref 0.44–1.00)
GFR, Estimated: 49 mL/min — ABNORMAL LOW (ref >60)
Glucose, Bld: 96 mg/dL (ref 70–99)
Potassium: 4.5 mmol/L (ref 3.5–5.1)
Sodium: 140 mmol/L (ref 135–145)

## 2022-02-11 LAB — CBC
HCT: 31.7 % — ABNORMAL LOW (ref 36.0–46.0)
Hemoglobin: 9.9 g/dL — ABNORMAL LOW (ref 12.0–15.0)
MCH: 27.9 pg (ref 26.0–34.0)
MCHC: 31.2 g/dL (ref 30.0–36.0)
MCV: 89.3 fL (ref 80.0–100.0)
Platelets: 352 10*3/uL (ref 150–400)
RBC: 3.55 MIL/uL — ABNORMAL LOW (ref 3.87–5.11)
RDW: 15.3 % (ref 11.5–15.5)
WBC: 13 10*3/uL — ABNORMAL HIGH (ref 4.0–10.5)
nRBC: 0 % (ref 0.0–0.2)

## 2022-02-11 LAB — CULTURE, BLOOD (SINGLE)
Culture: NO GROWTH
Culture: NO GROWTH
Special Requests: ADEQUATE
Special Requests: ADEQUATE

## 2022-02-11 LAB — MAGNESIUM: Magnesium: 2 mg/dL (ref 1.7–2.4)

## 2022-02-11 MED ORDER — FUROSEMIDE 10 MG/ML IJ SOLN
40.0000 mg | Freq: Every day | INTRAMUSCULAR | Status: DC
  Administered 2022-02-12: 40 mg via INTRAVENOUS
  Filled 2022-02-11: qty 4

## 2022-02-11 MED ORDER — AMOXICILLIN-POT CLAVULANATE 875-125 MG PO TABS
1.0000 | ORAL_TABLET | Freq: Two times a day (BID) | ORAL | Status: DC
  Administered 2022-02-11 – 2022-02-12 (×3): 1 via ORAL
  Filled 2022-02-11 (×3): qty 1

## 2022-02-11 NOTE — Progress Notes (Signed)
Progress Note   Patient: Amy Hensley ELT:[id number] DOB: April 06, 1933 DOA: 02/06/2022     5 DOS: the patient was seen and examined on 02/11/2022   Brief hospital course: 88 year old female past medical history of giant cell arteritis, hypertension plus recent diagnosis of COVID few days prior who presented from skilled nursing facility on 9/26 worsening shortness of breath and acute on chronic respiratory failure requiring 4 to 5 L nasal cannula to have severe sepsis secondary to pneumonia.  Patient was admitted, several hours later, decompensated from respiratory standpoint requiring BiPAP and placed in ICU.  PE ruled out and since then, patient has been able to be weaned down to 5 L high flow nasal cannula. 9/29.  Antibiotics switched to Unasyn for aspiration pneumonia, speech therapy evaluation.  Increase IV Lasix for acute on chronic diastolic congestive heart failure. 10/1.  Condition improving, antibiotic changed to Augmentin.  Decrease IV Lasix to 40 mg daily.  Assessment and Plan: Severe sepsis secondary to bacterial pneumonia and COVID  Acute on chronic respiratory failure with hypoxemia. Aspiration pneumonia of bilateral lower lobes Patient met severe sepsis criteria at the time of admission with tachycardia, tachypnea and leukocytosis.  Patient also had hypotension and acute respiratory failure. I have personally reviewed patient's chest CT scan, multilobe pneumonia.  Patient also appeared to have significant aspiration risk, she could not clear her upper airway. Antibiotic changed to Unasyn. Continue Molnupiravir.  Patient is seen by speech therapy, aspiration could be from esophageal phase. Patient condition has greatly improved, she no longer has mucus in upper airway.  I will change antibiotic to Augmentin with a total course of antibiotics for 5 days.   Acute on chronic diastolic congestive heart failure. Chronic kidney disease stage IIIb. Hypokalemia. Metabolic  acidosis. Patient had evidence of significant volume overload, she still short of breath, significant crackles in the base bilaterally.   Volume status much better today.  Only has small amount of crackles in the base.  I will decrease furosemide to 40 mg IV daily for another day. Potassium and bicarb normalized.  Discontinue sodium bicarbonate.   PSVT. Patient had an episode of PSVT 9/29, was given 5 mg IV metoprolol, condition has improved.   Pyuria.  With Enterococcus colonization. Patient does not have urinary symptoms, no UTI.   Essential hypertension. Blood pressure medicine on hold.      Subjective:  Patient doing much better today, short of breath improved.  She is able to protect her airway better.  Physical Exam: Vitals:   02/10/22 2046 02/11/22 0045 02/11/22 0428 02/11/22 1025  BP: 130/78 125/71 (!) 149/101 120/87  Pulse: 87 85 96 96  Resp: _0 Temp: 97.6 F (36.4 C) 97.6 F (36.4 C) 97.8 F (36.6 C) 98 F (36.7 C)  TempSrc:      SpO2:  97% 100% 98%  Weight:      Height:       General exam: Appears calm and comfortable  Respiratory system: Crackles in the base, but much improved.Marland Kitchen Respiratory effort normal. Cardiovascular system: S1 & S2 heard, RRR. No JVD, murmurs, rubs, gallops or clicks. No pedal edema. Gastrointestinal system: Abdomen is nondistended, soft and nontender. No organomegaly or masses felt. Normal bowel sounds heard. Central nervous system: Alert and oriented. No focal neurological deficits. Extremities: Symmetric 5 x 5 power. Skin: No rashes, lesions or ulcers Psychiatry: Judgement and insight appear normal. Mood & affect appropriate.   Data Reviewed:  Lab results reviewed.  Family Communication:  Disposition: Status is: Inpatient Remains inpatient appropriate because: Severity of disease, IV treatment.  Planned Discharge Destination:  Long term care    Time spent: 35 minutes  Author: Sharen Hones, MD 02/11/2022 10:55  AM  For on call review [email or web address].

## 2022-02-12 DIAGNOSIS — N1831 Chronic kidney disease, stage 3a: Secondary | ICD-10-CM

## 2022-02-12 DIAGNOSIS — I5033 Acute on chronic diastolic (congestive) heart failure: Secondary | ICD-10-CM | POA: Diagnosis not present

## 2022-02-12 DIAGNOSIS — A419 Sepsis, unspecified organism: Secondary | ICD-10-CM | POA: Diagnosis not present

## 2022-02-12 DIAGNOSIS — J9621 Acute and chronic respiratory failure with hypoxia: Secondary | ICD-10-CM

## 2022-02-12 DIAGNOSIS — N179 Acute kidney failure, unspecified: Secondary | ICD-10-CM

## 2022-02-12 LAB — CBC
HCT: 32.8 % — ABNORMAL LOW (ref 36.0–46.0)
Hemoglobin: 10.5 g/dL — ABNORMAL LOW (ref 12.0–15.0)
MCH: 28.4 pg (ref 26.0–34.0)
MCHC: 32 g/dL (ref 30.0–36.0)
MCV: 88.6 fL (ref 80.0–100.0)
Platelets: 329 10*3/uL (ref 150–400)
RBC: 3.7 MIL/uL — ABNORMAL LOW (ref 3.87–5.11)
RDW: 15 % (ref 11.5–15.5)
WBC: 17.9 10*3/uL — ABNORMAL HIGH (ref 4.0–10.5)
nRBC: 0 % (ref 0.0–0.2)

## 2022-02-12 LAB — BASIC METABOLIC PANEL
Anion gap: 9 (ref 5–15)
BUN: 37 mg/dL — ABNORMAL HIGH (ref 8–23)
CO2: 24 mmol/L (ref 22–32)
Calcium: 9 mg/dL (ref 8.9–10.3)
Chloride: 100 mmol/L (ref 98–111)
Creatinine, Ser: 0.96 mg/dL (ref 0.44–1.00)
GFR, Estimated: 57 mL/min — ABNORMAL LOW (ref >60)
Glucose, Bld: 87 mg/dL (ref 70–99)
Potassium: 3.6 mmol/L (ref 3.5–5.1)
Sodium: 133 mmol/L — ABNORMAL LOW (ref 135–145)

## 2022-02-12 LAB — PROCALCITONIN: Procalcitonin: 0.81 ng/mL

## 2022-02-12 LAB — MAGNESIUM: Magnesium: 1.8 mg/dL (ref 1.7–2.4)

## 2022-02-12 MED ORDER — AMOXICILLIN-POT CLAVULANATE 875-125 MG PO TABS: 1.0000 | ORAL_TABLET | Freq: Two times a day (BID) | ORAL | 0 refills | Status: DC

## 2022-02-12 MED ORDER — MECLIZINE HCL 25 MG PO TABS: 25.0000 mg | ORAL_TABLET | Freq: Three times a day (TID) | ORAL | 0 refills | Status: DC | PRN

## 2022-02-12 MED ORDER — MECLIZINE HCL 25 MG PO TABS: 12.5000 mg | ORAL_TABLET | Freq: Three times a day (TID) | ORAL | 0 refills | Status: AC | PRN

## 2022-02-12 MED ORDER — ADULT MULTIVITAMIN W/MINERALS CH: 1.0000 | ORAL_TABLET | Freq: Every day | ORAL | Status: AC

## 2022-02-12 MED ORDER — LORATADINE 10 MG PO TABS: 5.0000 mg | ORAL_TABLET | Freq: Every day | ORAL | Status: AC

## 2022-02-12 MED ORDER — DEXAMETHASONE 6 MG PO TABS: 6.0000 mg | ORAL_TABLET | Freq: Every day | ORAL | 0 refills | Status: DC

## 2022-02-12 NOTE — Assessment & Plan Note (Signed)
Replacing for Mg 1.5 today. Monitor Mg, replace as needed. 

## 2022-02-12 NOTE — TOC Transition Note (Signed)
Transition of Care [facility]) - CM/SW Discharge Note   Patient Details  Name: Amy Hensley MRN: [id number] Date of Birth: 02/15/1933  Transition of Care [facility]) CM/SW Contact:  Candie Chroman, LCSW Phone Number: 02/12/2022, 3:16 PM   Clinical Narrative:   Patient has orders to discharge to [facility] today. RN will call report to [phone] (Room 404-B). EMS transport has been arranged and she is 5th on the list. No further concerns. CSW signing off.  Final next level of care: Skilled Nursing Facility Barriers to Discharge: Barriers Resolved   Patient Goals and CMS Choice     Choice offered to / list presented to : NA  Discharge Placement   Existing PASRR number confirmed : 02/09/22          Patient chooses bed at: [facility] [place] Patient to be transferred to facility by: EMS Name of family member notified: Natassia Guthridge Patient and family notified of of transfer: 02/12/22  Discharge Plan and Services                                     Social Determinants of Health (SDOH) Interventions     Readmission Risk Interventions    12/01/2021    9:16 AM  Readmission Risk Prevention Plan  Transportation Screening Complete  PCP or Specialist Appt within 3-5 Days Complete  HRI or [place] Complete  Social Work Consult for [place] Planning/Counseling Complete  Palliative Care Screening Not Applicable  Medication Review [employer]) Complete

## 2022-02-12 NOTE — Discharge Summary (Addendum)
Physician Discharge Summary   Patient: Amy Hensley MRN: [id number] DOB: 24-Aug-1932  Admit date:     02/06/2022  Discharge date: 02/12/22  Discharge Physician: Annita Brod   PCP: Adin Hector, MD   Recommendations at discharge:   New medication: Augmentin 875 p.o. twice daily x3 doses New medication: Decadron 6 mg p.o. daily x3 more days Change in diet: Dysphagia 3 diet with thin liquids and no straws Medication change: Antivert changed from 12.5 p.o. 3 times daily 12.5 p.o. 3 times daily as needed Medication clarification: Prior to admission patient has been started on molnupiravir.  This medication was completed during hospitalization and therefore discontinued New medication: Multivitamin p.o. daily Medication change: Prior to admission, patient had been started on Levaquin.  This medication has been discontinued Medication change: Oxy IR 5 every 4 hours as needed discontinued  Discharge Diagnoses: Principal Problem:   Severe sepsis ([place]) secondary to bacterial pneumonia and COVID Active Problems:   Acute renal failure superimposed on stage 3a chronic kidney disease (HCC)   Hypertension   Hypothyroidism   Aspiration pneumonia (HCC)   Hypokalemia   COVID-19 virus infection   PSVT (paroxysmal supraventricular tachycardia)  Resolved Problems:   Acute on chronic respiratory failure with hypoxia (HCC)   Acute on chronic diastolic CHF (congestive heart failure) [facility])  Hospital Course: 88 year old female past medical history of giant cell arteritis, hypertension plus recent diagnosis of COVID few days prior who presented from skilled nursing facility on 9/26 worsening shortness of breath and acute on chronic respiratory failure requiring 4 to 5 L nasal cannula to have severe sepsis secondary to pneumonia.  Patient was admitted, several hours later, decompensated from respiratory standpoint requiring BiPAP and placed in ICU.  PE ruled out and since then, patient has been  able to be weaned down to 5 L high flow nasal cannula. Antibiotics switched to Unasyn for aspiration pneumonia, speech therapy evaluation.  Patient treated with IV Lasix for acute on chronic diastolic congestive heart failure.  Felt to be stable for discharge by 10/2.  Assessment and Plan: * Severe sepsis ([place]) secondary to bacterial pneumonia and COVID Meets criteria for severe sepsis on admission given lactic acidosis, developing hypotension, acute respiratory failure with tachycardia, tachypnea and leukocytosis.  Completed molnupiravir.  IV antibiotics changed to p.o. and 3 more days of p.o. steroids.  By day of discharge, procalcitonin down to 0.81.  White blood cell count elevated, but this is more likely from steroids.  Pneumonia felt to be aspiration and patient treated with IV Levaquin changed over to p.o. Augmentin for 2 more days.  Acute on chronic respiratory failure with hypoxia (HCC)-resolved as of 02/12/2022 Secondary to COVID, pneumonia and also some component of diastolic heart failure.  Has improved significantly.  Weaning down to baseline of 3 L  Acute on chronic diastolic CHF (congestive heart failure) (HCC)-resolved as of 02/12/2022 On admission, BNP slightly elevated in the 300s although some of this may have been from generalized respiratory failure from [place].  However, now received aggressive fluid resuscitation after she became hypotensive.  Started on IV Lasix and since then has diuresed almost 6 L and is almost -5 L deficient.  Felt to be back to baseline.  Acute renal failure superimposed on stage 3a chronic kidney disease ([place]) Following aggressive fluid resuscitation from hypotension.  With some diuresis, GFR down to 48, since then has improved and by day of discharge, creatinine 0.96 with GFR 57.  Hypertension Medications initially held due  to hypotension and restarted by discharge  Hypothyroidism Continue Synthroid  Hypokalemia Replaced as needed  Aspiration  pneumonia ([place]) Placed on broad-spectrum antibiotics.  Seen by speech therapy and recommend to be on a dysphagia 3 diet with thin liquids.  Discharged on p.o. Augmentin x2 days         Consultants: None Procedures performed: None Disposition: Skilled nursing facility Diet recommendation:  Dysphagia 3 diet with thin liquids and no straws DISCHARGE MEDICATION: Allergies as of 02/12/2022       Reactions   Alendronate Other (See Comments)   Dyspepsia (indigestion)    Buspirone Other (See Comments)   sedation   Codeine    twitching        Medication List     STOP taking these medications    levofloxacin 500 MG tablet Commonly known as: LEVAQUIN   molnupiravir EUA 200 mg Caps capsule Commonly known as: LAGEVRIO   oxyCODONE 5 MG immediate release tablet Commonly known as: Oxy IR/ROXICODONE   sodium chloride 0.45 % solution       TAKE these medications    Acetaminophen 8 Hour 650 MG CR tablet Generic drug: acetaminophen Take 650 mg by mouth every 8 (eight) hours as needed for pain.   amLODipine 10 MG tablet Commonly known as: NORVASC Take 10 mg by mouth daily.   amoxicillin-clavulanate 875-125 MG tablet Commonly known as: AUGMENTIN Take 1 tablet by mouth every 12 (twelve) hours.   calcitonin (salmon) 200 UNIT/ACT nasal spray Commonly known as: MIACALCIN/FORTICAL Place 1 spray into alternate nostrils daily.   calcium carbonate 750 MG chewable tablet Commonly known as: TUMS EX Chew 2 tablets by mouth 3 (three) times daily as needed for heartburn.   cholecalciferol 25 MCG (1000 UNIT) tablet Commonly known as: VITAMIN D3 Take 1,000 Units by mouth daily.   dexamethasone 6 MG tablet Commonly known as: DECADRON Take 1 tablet (6 mg total) by mouth daily. Start taking on: February 13, 2022   Dextromethorphan-Guaifenesin 60-1200 MG 12hr tablet Take 1 tablet by mouth every 12 (twelve) hours.   esomeprazole 20 MG capsule Commonly known as: NEXIUM Take 20 mg  by mouth every morning.   feeding supplement Liqd Take 237 mLs by mouth 3 (three) times daily between meals.   ipratropium-albuterol 0.5-2.5 (3) MG/3ML Soln Commonly known as: DUONEB Take 3 mLs by nebulization every 6 (six) hours.   levothyroxine 50 MCG tablet Commonly known as: SYNTHROID Take 50 mcg by mouth daily before breakfast.   loratadine 10 MG tablet Commonly known as: CLARITIN Take 0.5 tablets (5 mg total) by mouth at bedtime.   magnesium oxide 400 MG tablet Commonly known as: MAG-OX Take 400 mg by mouth daily.   meclizine 25 MG tablet Commonly known as: ANTIVERT Take 0.5 tablets (12.5 mg total) by mouth 3 (three) times daily as needed for dizziness. What changed:  how much to take when to take this reasons to take this   metaxalone 800 MG tablet Commonly known as: SKELAXIN Take 400 mg by mouth every 6 (six) hours as needed for muscle spasms.   multivitamin with minerals Tabs tablet Take 1 tablet by mouth daily. Start taking on: February 13, 2022   ondansetron 4 MG disintegrating tablet Commonly known as: ZOFRAN-ODT Take 4 mg by mouth every 4 (four) hours as needed for nausea or vomiting.   polyethylene glycol 17 g packet Commonly known as: MIRALAX / GLYCOLAX Take 17 g by mouth daily.   pregabalin 25 MG capsule Commonly known as: LYRICA  Take 25 mg by mouth every 12 (twelve) hours.   Salonpas 3.05-19-08 % Ptch Generic drug: Camphor-Menthol-Methyl Sal Apply 1 patch topically daily. (Apply to lower back)   sennosides-docusate sodium 8.6-50 MG tablet Commonly known as: SENOKOT-S Take 1 tablet by mouth 2 (two) times daily.   Simethicone 80 MG Tabs Take 160 mg by mouth 2 (two) times daily.        Contact information for after-discharge care     Destination     [facility] Preferred SNF .   Service: Skilled Nursing Contact information: [address] [place] [phone]                     Discharge Exam: Danley Danker Weights   02/06/22 1528 02/07/22 0100  Weight: 62.6 kg 56.7 kg   General: Alert and oriented x2, no acute distress Cardiovascular: Regular rate and rhythm, occasional ectopic beat Lungs: Clear to auscultation bilaterally  Condition at discharge: improving  The results of significant diagnostics from this hospitalization (including imaging, microbiology, ancillary and laboratory) are listed below for reference.   Imaging Studies: CT Angio Chest Pulmonary Embolism (PE) W or WO Contrast  Result Date: 02/06/2022 CLINICAL DATA:  Shortness of breath EXAM: CT ANGIOGRAPHY CHEST WITH CONTRAST TECHNIQUE: Multidetector CT imaging of the chest was performed using the standard protocol during bolus administration of intravenous contrast. Multiplanar CT image reconstructions and MIPs were obtained to evaluate the vascular anatomy. RADIATION DOSE REDUCTION: This exam was performed according to the departmental dose-optimization program which includes automated exposure control, adjustment of the mA and/or kV according to patient size and/or use of iterative reconstruction technique. CONTRAST:  55m OMNIPAQUE IOHEXOL 350 MG/ML SOLN COMPARISON:  Chest x-ray 02/06/2022, CT chest 11/30/2021 FINDINGS: Cardiovascular: Satisfactory opacification of the pulmonary arteries to the segmental level. Respiratory motion degradation. No definitive acute pulmonary embolus. Moderate aortic atherosclerosis. No aneurysm. No dissection. Coronary vascular calcification. Normal cardiac size. Trace pericardial effusion. Mediastinum/Nodes: Midline trachea. No thyroid mass. Moderate hiatal hernia. Distal esophageal thickening. Fluid and air in the upper esophagus. Prominent precarinal lymph node measuring 10 mm, without change. Lungs/Pleura: Emphysema. Heterogeneous ground-glass density and partial consolidations in the posterior upper lobes and bilateral lower lobes. No pleural effusion or pneumothorax. Upper  Abdomen: Atrophic left kidney. Musculoskeletal: Old right-sided rib fractures. Treated compression fractures at T12 and L1. Moderate compression fracture at T10. Review of the MIP images confirms the above findings. IMPRESSION: 1. No definitive acute pulmonary embolus. There is respiratory motion degradation 2. Partial consolidations and heterogeneous ground-glass densities within the posterior upper lobes and the bilateral lower lobes suspicious for aspiration or potential multifocal pneumonia 3. Moderate hiatal hernia with thickened appearance of the distal esophagus and GE junction. 4. Moderate compression fracture T10 age indeterminate but new as compared with CT from July. Aortic Atherosclerosis (ICD10-I70.0) and Emphysema (ICD10-J43.9). Electronically Signed   By: KDonavan FoilM.D.   On: 02/06/2022 20:51   DG Chest Port 1 View  Result Date: 02/06/2022 CLINICAL DATA:  Acute hypoxic respiratory failure due to COVID 19 EXAM: PORTABLE CHEST 1 VIEW COMPARISON:  Chest 02/06/2022 FINDINGS: Mild progression of bilateral airspace disease in the bases and right perihilar area. Left perihilar linear density unchanged. No pleural effusion. Negative for heart failure IMPRESSION: Bilateral airspace disease with mild progression. Electronically Signed   By: CFranchot GalloM.D.   On: 02/06/2022 19:33   DG Chest Portable 1 View  Result Date: 02/06/2022 CLINICAL DATA:  Shortness of breath  EXAM: PORTABLE CHEST 1 VIEW COMPARISON:  Chest x-ray 11/30/2021 FINDINGS: Cardiomediastinal silhouette is within normal limits. There are atherosclerotic calcifications of the aorta. There is a band of linear atelectasis or scarring in the left mid lung. There are minimal patchy and strandy opacities in the right mid lung, slightly increased from prior. There is scarring in the left lung base. There is no pleural effusion or pneumothorax. No acute fractures are seen. IMPRESSION: Increasing linear and patchy opacities in the right mid  lung worrisome for infection. Recommend follow-up PA and lateral chest x-ray in 4-6 weeks to confirm resolution. Electronically Signed   By: Ronney Asters M.D.   On: 02/06/2022 16:44    Microbiology: Results for orders placed or performed during the hospital encounter of 02/06/22  Blood culture (single)     Status: None   Collection Time: 02/06/22  3:33 PM   Specimen: BLOOD  Result Value Ref Range Status   Specimen Description BLOOD LEFT ANTECUBITAL  Final   Special Requests   Final    BOTTLES DRAWN AEROBIC AND ANAEROBIC Blood Culture adequate volume   Culture   Final    NO GROWTH 5 DAYS Performed at [facility], [address] [place], [place] [address]    Report Status 02/11/2022 FINAL  Final  Culture, blood (single)     Status: None   Collection Time: 02/06/22  3:33 PM   Specimen: BLOOD  Result Value Ref Range Status   Specimen Description BLOOD RIGHT ANTECUBITAL  Final   Special Requests   Final    BOTTLES DRAWN AEROBIC AND ANAEROBIC Blood Culture adequate volume   Culture   Final    NO GROWTH 5 DAYS Performed at [facility], [address] [place], [place] [address]    Report Status 02/11/2022 FINAL  Final  SARS Coronavirus 2 by RT PCR (hospital order, performed in [place] hospital lab) *cepheid single result test* Anterior Nasal Swab     Status: Abnormal   Collection Time: 02/06/22  3:33 PM   Specimen: Anterior Nasal Swab  Result Value Ref Range Status   SARS Coronavirus 2 by RT PCR POSITIVE (A) NEGATIVE Final    Comment: (NOTE) SARS-CoV-2 target nucleic acids are DETECTED  SARS-CoV-2 RNA is generally detectable in upper respiratory specimens  during the acute phase of infection.  Positive results are indicative  of the presence of the identified virus, but do not rule out bacterial infection or co-infection with other pathogens not detected by the test.  Clinical correlation with patient history and  other diagnostic information is necessary  to determine patient infection status.  The expected result is negative.  Fact Sheet for Patients:   https://www.patel.info/   Fact Sheet for Healthcare Providers:   https://hall.com/    This test is not yet approved or cleared by the [place] FDA and  has been authorized for detection and/or diagnosis of SARS-CoV-2 by FDA under an Emergency Use Authorization (EUA).  This EUA will remain in effect (meaning this test can be used) for the duration of  the COVID-19 declaration under Section 564(b)(1)  of the Act, 21 U.S.C. section 360-bbb-3(b)(1), unless the authorization is terminated or revoked sooner.   Performed at [facility], [address], [place], [place] [address]   Urine Culture     Status: Abnormal   Collection Time: 02/06/22  8:13 PM   Specimen: Urine, Clean Catch  Result Value Ref Range Status   Specimen Description   Final  URINE, CLEAN CATCH Performed at [facility], [address] [place], [place] [address]    Special Requests   Final    NONE Performed at [facility], [address] [place], [place] [address]    Culture 20,000 COLONIES/mL ENTEROCOCCUS FAECIUM (A)  Final   Report Status 02/09/2022 FINAL  Final   Organism ID, Bacteria ENTEROCOCCUS FAECIUM (A)  Final      Susceptibility   Enterococcus faecium - MIC*    AMPICILLIN <=2 SENSITIVE Sensitive     NITROFURANTOIN 32 SENSITIVE Sensitive     VANCOMYCIN <=0.5 SENSITIVE Sensitive     * 20,000 COLONIES/mL ENTEROCOCCUS FAECIUM  Surgical pcr screen     Status: None   Collection Time: 02/07/22  1:35 AM   Specimen: Nasal Mucosa; Nasal Swab  Result Value Ref Range Status   MRSA, PCR NEGATIVE NEGATIVE Final   Staphylococcus aureus NEGATIVE NEGATIVE Final    Comment: (NOTE) The Xpert SA Assay (FDA approved for NASAL specimens in patients 23 years of age and older), is one component of a comprehensive surveillance program. It  is not intended to diagnose infection nor to guide or monitor treatment. Performed at [facility], [address] [place], [place] [address]     Labs: CBC: Recent Labs  Lab 02/06/22 1533 02/07/22 0707 02/08/22 0432 02/09/22 0703 02/11/22 0600 02/12/22 0537  WBC 12.3* 13.6* 17.7* 14.5* 13.0* 17.9*  NEUTROABS 9.7*  --   --   --   --   --   HGB 10.6* 9.9* 9.9* 10.7* 9.9* 10.5*  HCT 34.2* 30.9* 31.6* 33.5* 31.7* 32.8*  MCV 91.7 90.1 91.6 89.6 89.3 88.6  PLT 328 306 334 356 352 119   Basic Metabolic Panel: Recent Labs  Lab 02/08/22 0432 02/09/22 0703 02/10/22 0701 02/11/22 0600 02/12/22 0537  NA 140 135 136 140 133*  K 3.9 3.7 3.4* 4.5 3.6  CL 109 104 103 105 100  CO2 22 19* 21* 24 24  GLUCOSE 126* 102* 101* 96 87  BUN 28* 31* 35* 37* 37*  CREATININE 1.07* 1.08* 1.12* 1.09* 0.96  CALCIUM 8.4* 8.9 8.6* 9.0 9.0  MG  --   --  2.0 2.0 1.8   Liver Function Tests: Recent Labs  Lab 02/06/22 1533 02/07/22 0707  AST 28 17  ALT 9 9  ALKPHOS 68 66  BILITOT 0.5 0.7  PROT 5.6* 5.6*  ALBUMIN 2.3* 2.3*   CBG: Recent Labs  Lab 02/07/22 0117  GLUCAP 144*    Discharge time spent: less than 30 minutes.  Signed: Annita Brod, MD [facility] 02/12/2022

## 2022-02-12 NOTE — Assessment & Plan Note (Signed)
Placed on broad-spectrum antibiotics.  Seen by speech therapy and recommend to be on a dysphagia 3 diet with thin liquids.  Discharged on p.o. Augmentin x2 days

## 2022-02-13 DIAGNOSIS — M6281 Muscle weakness (generalized): Secondary | ICD-10-CM | POA: Diagnosis not present

## 2022-02-13 DIAGNOSIS — J9600 Acute respiratory failure, unspecified whether with hypoxia or hypercapnia: Secondary | ICD-10-CM | POA: Diagnosis not present

## 2022-02-13 DIAGNOSIS — F419 Anxiety disorder, unspecified: Secondary | ICD-10-CM | POA: Diagnosis not present

## 2022-02-13 DIAGNOSIS — M5489 Other dorsalgia: Secondary | ICD-10-CM | POA: Diagnosis not present

## 2022-02-15 DIAGNOSIS — J159 Unspecified bacterial pneumonia: Secondary | ICD-10-CM | POA: Diagnosis not present

## 2022-02-15 DIAGNOSIS — Z741 Need for assistance with personal care: Secondary | ICD-10-CM | POA: Diagnosis not present

## 2022-02-15 DIAGNOSIS — M6281 Muscle weakness (generalized): Secondary | ICD-10-CM | POA: Diagnosis not present

## 2022-02-15 DIAGNOSIS — E569 Vitamin deficiency, unspecified: Secondary | ICD-10-CM | POA: Diagnosis not present

## 2022-02-15 DIAGNOSIS — Z8616 Personal history of COVID-19: Secondary | ICD-10-CM | POA: Diagnosis not present

## 2022-02-15 DIAGNOSIS — F419 Anxiety disorder, unspecified: Secondary | ICD-10-CM | POA: Diagnosis not present

## 2022-02-15 DIAGNOSIS — S3210XS Unspecified fracture of sacrum, sequela: Secondary | ICD-10-CM | POA: Diagnosis not present

## 2022-02-15 DIAGNOSIS — R498 Other voice and resonance disorders: Secondary | ICD-10-CM | POA: Diagnosis not present

## 2022-02-15 DIAGNOSIS — R2681 Unsteadiness on feet: Secondary | ICD-10-CM | POA: Diagnosis not present

## 2022-02-15 DIAGNOSIS — N1832 Chronic kidney disease, stage 3b: Secondary | ICD-10-CM | POA: Diagnosis not present

## 2022-02-15 DIAGNOSIS — J9601 Acute respiratory failure with hypoxia: Secondary | ICD-10-CM | POA: Diagnosis not present

## 2022-02-15 DIAGNOSIS — M5416 Radiculopathy, lumbar region: Secondary | ICD-10-CM | POA: Diagnosis not present

## 2022-02-15 DIAGNOSIS — Z9181 History of falling: Secondary | ICD-10-CM | POA: Diagnosis not present

## 2022-02-15 DIAGNOSIS — E039 Hypothyroidism, unspecified: Secondary | ICD-10-CM | POA: Diagnosis not present

## 2022-02-15 DIAGNOSIS — I10 Essential (primary) hypertension: Secondary | ICD-10-CM | POA: Diagnosis not present

## 2022-02-15 DIAGNOSIS — M5489 Other dorsalgia: Secondary | ICD-10-CM | POA: Diagnosis not present

## 2022-02-16 DIAGNOSIS — Z79899 Other long term (current) drug therapy: Secondary | ICD-10-CM | POA: Diagnosis not present

## 2022-02-16 DIAGNOSIS — D638 Anemia in other chronic diseases classified elsewhere: Secondary | ICD-10-CM | POA: Diagnosis not present

## 2022-02-16 DIAGNOSIS — J9601 Acute respiratory failure with hypoxia: Secondary | ICD-10-CM | POA: Diagnosis not present

## 2022-02-16 DIAGNOSIS — N182 Chronic kidney disease, stage 2 (mild): Secondary | ICD-10-CM | POA: Diagnosis not present

## 2022-02-16 DIAGNOSIS — J159 Unspecified bacterial pneumonia: Secondary | ICD-10-CM | POA: Diagnosis not present

## 2022-02-18 ENCOUNTER — Other Ambulatory Visit: Payer: Self-pay

## 2022-02-18 ENCOUNTER — Inpatient Hospital Stay
Admission: EM | Admit: 2022-02-18 | Discharge: 2022-02-23 | DRG: 871 | Disposition: A | Discharge status: Skilled Nursing Facility | Payer: PPO | Source: Skilled Nursing Facility | Attending: Internal Medicine | Admitting: Internal Medicine

## 2022-02-18 ENCOUNTER — Emergency Department: Payer: PPO

## 2022-02-18 DIAGNOSIS — J1282 Pneumonia due to coronavirus disease 2019: Secondary | ICD-10-CM | POA: Diagnosis not present

## 2022-02-18 DIAGNOSIS — J9621 Acute and chronic respiratory failure with hypoxia: Secondary | ICD-10-CM | POA: Diagnosis present

## 2022-02-18 DIAGNOSIS — Z79890 Hormone replacement therapy (postmenopausal): Secondary | ICD-10-CM

## 2022-02-18 DIAGNOSIS — I129 Hypertensive chronic kidney disease with stage 1 through stage 4 chronic kidney disease, or unspecified chronic kidney disease: Secondary | ICD-10-CM | POA: Diagnosis not present

## 2022-02-18 DIAGNOSIS — I10 Essential (primary) hypertension: Secondary | ICD-10-CM

## 2022-02-18 DIAGNOSIS — Z888 Allergy status to other drugs, medicaments and biological substances status: Secondary | ICD-10-CM | POA: Diagnosis not present

## 2022-02-18 DIAGNOSIS — G629 Polyneuropathy, unspecified: Secondary | ICD-10-CM | POA: Diagnosis not present

## 2022-02-18 DIAGNOSIS — U099 Post covid-19 condition, unspecified: Secondary | ICD-10-CM | POA: Diagnosis present

## 2022-02-18 DIAGNOSIS — G8929 Other chronic pain: Secondary | ICD-10-CM | POA: Diagnosis not present

## 2022-02-18 DIAGNOSIS — R6520 Severe sepsis without septic shock: Secondary | ICD-10-CM | POA: Diagnosis not present

## 2022-02-18 DIAGNOSIS — R000 Tachycardia, unspecified: Secondary | ICD-10-CM | POA: Diagnosis not present

## 2022-02-18 DIAGNOSIS — Z87891 Personal history of nicotine dependence: Secondary | ICD-10-CM

## 2022-02-18 DIAGNOSIS — E039 Hypothyroidism, unspecified: Secondary | ICD-10-CM | POA: Diagnosis not present

## 2022-02-18 DIAGNOSIS — D649 Anemia, unspecified: Secondary | ICD-10-CM | POA: Diagnosis not present

## 2022-02-18 DIAGNOSIS — Z79899 Other long term (current) drug therapy: Secondary | ICD-10-CM | POA: Diagnosis not present

## 2022-02-18 DIAGNOSIS — K219 Gastro-esophageal reflux disease without esophagitis: Secondary | ICD-10-CM | POA: Diagnosis not present

## 2022-02-18 DIAGNOSIS — J189 Pneumonia, unspecified organism: Secondary | ICD-10-CM | POA: Diagnosis present

## 2022-02-18 DIAGNOSIS — R0902 Hypoxemia: Secondary | ICD-10-CM | POA: Diagnosis not present

## 2022-02-18 DIAGNOSIS — N1831 Chronic kidney disease, stage 3a: Secondary | ICD-10-CM | POA: Diagnosis present

## 2022-02-18 DIAGNOSIS — U071 COVID-19: Secondary | ICD-10-CM | POA: Diagnosis present

## 2022-02-18 DIAGNOSIS — Z885 Allergy status to narcotic agent status: Secondary | ICD-10-CM

## 2022-02-18 DIAGNOSIS — A419 Sepsis, unspecified organism: Principal | ICD-10-CM | POA: Diagnosis present

## 2022-02-18 DIAGNOSIS — I700 Atherosclerosis of aorta: Secondary | ICD-10-CM | POA: Diagnosis not present

## 2022-02-18 DIAGNOSIS — Z8249 Family history of ischemic heart disease and other diseases of the circulatory system: Secondary | ICD-10-CM | POA: Diagnosis not present

## 2022-02-18 DIAGNOSIS — Z743 Need for continuous supervision: Secondary | ICD-10-CM | POA: Diagnosis not present

## 2022-02-18 DIAGNOSIS — Z66 Do not resuscitate: Secondary | ICD-10-CM | POA: Diagnosis present

## 2022-02-18 DIAGNOSIS — J9811 Atelectasis: Secondary | ICD-10-CM | POA: Diagnosis present

## 2022-02-18 DIAGNOSIS — D720 Genetic anomalies of leukocytes: Secondary | ICD-10-CM | POA: Diagnosis present

## 2022-02-18 DIAGNOSIS — M47816 Spondylosis without myelopathy or radiculopathy, lumbar region: Secondary | ICD-10-CM | POA: Diagnosis not present

## 2022-02-18 DIAGNOSIS — R069 Unspecified abnormalities of breathing: Secondary | ICD-10-CM | POA: Diagnosis not present

## 2022-02-18 DIAGNOSIS — R0689 Other abnormalities of breathing: Secondary | ICD-10-CM | POA: Diagnosis not present

## 2022-02-18 LAB — COMPREHENSIVE METABOLIC PANEL
ALT: 15 U/L (ref 0–44)
AST: 18 U/L (ref 15–41)
Albumin: 2.6 g/dL — ABNORMAL LOW (ref 3.5–5.0)
Alkaline Phosphatase: 72 U/L (ref 38–126)
Anion gap: 9 (ref 5–15)
BUN: 23 mg/dL (ref 8–23)
CO2: 25 mmol/L (ref 22–32)
Calcium: 8.3 mg/dL — ABNORMAL LOW (ref 8.9–10.3)
Chloride: 102 mmol/L (ref 98–111)
Creatinine, Ser: 1.1 mg/dL — ABNORMAL HIGH (ref 0.44–1.00)
GFR, Estimated: 49 mL/min — ABNORMAL LOW (ref >60)
Glucose, Bld: 111 mg/dL — ABNORMAL HIGH (ref 70–99)
Potassium: 4.6 mmol/L (ref 3.5–5.1)
Sodium: 136 mmol/L (ref 135–145)
Total Bilirubin: 0.8 mg/dL (ref 0.3–1.2)
Total Protein: 5.8 g/dL — ABNORMAL LOW (ref 6.5–8.1)

## 2022-02-18 LAB — PROTIME-INR
INR: 1 (ref 0.8–1.2)
Prothrombin Time: 13.4 seconds (ref 11.4–15.2)

## 2022-02-18 LAB — PROCALCITONIN: Procalcitonin: 0.16 ng/mL

## 2022-02-18 LAB — CBC WITH DIFFERENTIAL/PLATELET
Abs Immature Granulocytes: 0.57 10*3/uL — ABNORMAL HIGH (ref 0.00–0.07)
Basophils Absolute: 0.1 10*3/uL (ref 0.0–0.1)
Basophils Relative: 0 %
Eosinophils Absolute: 0.1 10*3/uL (ref 0.0–0.5)
Eosinophils Relative: 1 %
HCT: 33.5 % — ABNORMAL LOW (ref 36.0–46.0)
Hemoglobin: 10.1 g/dL — ABNORMAL LOW (ref 12.0–15.0)
Immature Granulocytes: 3 %
Lymphocytes Relative: 4 %
Lymphs Abs: 0.9 10*3/uL (ref 0.7–4.0)
MCH: 28.1 pg (ref 26.0–34.0)
MCHC: 30.1 g/dL (ref 30.0–36.0)
MCV: 93.3 fL (ref 80.0–100.0)
Monocytes Absolute: 1.6 10*3/uL — ABNORMAL HIGH (ref 0.1–1.0)
Monocytes Relative: 8 %
Neutro Abs: 18 10*3/uL — ABNORMAL HIGH (ref 1.7–7.7)
Neutrophils Relative %: 84 %
Platelets: 371 10*3/uL (ref 150–400)
RBC: 3.59 MIL/uL — ABNORMAL LOW (ref 3.87–5.11)
RDW: 16.9 % — ABNORMAL HIGH (ref 11.5–15.5)
WBC: 21.2 10*3/uL — ABNORMAL HIGH (ref 4.0–10.5)
nRBC: 0 % (ref 0.0–0.2)

## 2022-02-18 LAB — BLOOD GAS, VENOUS
Acid-Base Excess: 6.2 mmol/L — ABNORMAL HIGH (ref 0.0–2.0)
Bicarbonate: 30.6 mmol/L — ABNORMAL HIGH (ref 20.0–28.0)
O2 Saturation: 62.5 %
Patient temperature: 37
pCO2, Ven: 42 mmHg — ABNORMAL LOW (ref 44–60)
pH, Ven: 7.47 — ABNORMAL HIGH (ref 7.25–7.43)
pO2, Ven: 38 mmHg (ref 32–45)

## 2022-02-18 LAB — RESP PANEL BY RT-PCR (FLU A&B, COVID) ARPGX2
Influenza A by PCR: NEGATIVE
Influenza B by PCR: NEGATIVE
SARS Coronavirus 2 by RT PCR: POSITIVE — AB

## 2022-02-18 LAB — BRAIN NATRIURETIC PEPTIDE: B Natriuretic Peptide: 263.1 pg/mL — ABNORMAL HIGH (ref 0.0–100.0)

## 2022-02-18 LAB — APTT: aPTT: 27 seconds (ref 24–36)

## 2022-02-18 LAB — LACTIC ACID, PLASMA: Lactic Acid, Venous: 1.2 mmol/L (ref 0.5–1.9)

## 2022-02-18 MED ORDER — VITAMIN D 25 MCG (1000 UNIT) PO TABS
1000.0000 [IU] | ORAL_TABLET | Freq: Every day | ORAL | Status: DC
  Administered 2022-02-19 – 2022-02-23 (×5): 1000 [IU] via ORAL
  Filled 2022-02-18 (×5): qty 1

## 2022-02-18 MED ORDER — VANCOMYCIN HCL IN DEXTROSE 1-5 GM/200ML-% IV SOLN
1000.0000 mg | Freq: Once | INTRAVENOUS | Status: AC
  Administered 2022-02-18: 1000 mg via INTRAVENOUS
  Filled 2022-02-18: qty 200

## 2022-02-18 MED ORDER — MAGNESIUM OXIDE 400 MG PO TABS
400.0000 mg | ORAL_TABLET | Freq: Every day | ORAL | Status: DC
  Administered 2022-02-19 – 2022-02-23 (×5): 400 mg via ORAL
  Filled 2022-02-18 (×10): qty 1

## 2022-02-18 MED ORDER — CALCITONIN (SALMON) 200 UNIT/ACT NA SOLN
1.0000 | Freq: Every day | NASAL | Status: DC
  Filled 2022-02-18: qty 3.7

## 2022-02-18 MED ORDER — SODIUM CHLORIDE 0.9 % IV SOLN
2.0000 g | INTRAVENOUS | Status: DC
  Administered 2022-02-19 – 2022-02-22 (×4): 2 g via INTRAVENOUS
  Filled 2022-02-18: qty 20
  Filled 2022-02-18: qty 2
  Filled 2022-02-18 (×3): qty 20

## 2022-02-18 MED ORDER — ONDANSETRON 4 MG PO TBDP: 4.0000 mg | ORAL_TABLET | ORAL | Status: DC | PRN

## 2022-02-18 MED ORDER — ADULT MULTIVITAMIN W/MINERALS CH
1.0000 | ORAL_TABLET | Freq: Every day | ORAL | Status: DC
  Administered 2022-02-19 – 2022-02-23 (×5): 1 via ORAL
  Filled 2022-02-18 (×5): qty 1

## 2022-02-18 MED ORDER — MAGNESIUM HYDROXIDE 400 MG/5ML PO SUSP: 30.0000 mL | Freq: Every day | ORAL | Status: DC | PRN

## 2022-02-18 MED ORDER — SODIUM CHLORIDE 0.9 % IV SOLN
2.0000 g | Freq: Once | INTRAVENOUS | Status: AC
  Administered 2022-02-18: 2 g via INTRAVENOUS
  Filled 2022-02-18: qty 12.5

## 2022-02-18 MED ORDER — ENSURE ENLIVE PO LIQD
237.0000 mL | Freq: Three times a day (TID) | ORAL | Status: DC
  Administered 2022-02-19 – 2022-02-22 (×7): 237 mL via ORAL

## 2022-02-18 MED ORDER — TRAZODONE HCL 50 MG PO TABS
25.0000 mg | ORAL_TABLET | Freq: Every evening | ORAL | Status: DC | PRN
  Administered 2022-02-22: 25 mg via ORAL
  Filled 2022-02-18: qty 1

## 2022-02-18 MED ORDER — ACETAMINOPHEN 650 MG RE SUPP: 650.0000 mg | Freq: Four times a day (QID) | RECTAL | Status: DC | PRN

## 2022-02-18 MED ORDER — POLYETHYLENE GLYCOL 3350 17 G PO PACK
17.0000 g | PACK | Freq: Every day | ORAL | Status: DC
  Administered 2022-02-19 – 2022-02-23 (×5): 17 g via ORAL
  Filled 2022-02-18 (×5): qty 1

## 2022-02-18 MED ORDER — MECLIZINE HCL 25 MG PO TABS: 12.5000 mg | ORAL_TABLET | Freq: Three times a day (TID) | ORAL | Status: DC | PRN

## 2022-02-18 MED ORDER — LORATADINE 10 MG PO TABS
5.0000 mg | ORAL_TABLET | Freq: Every day | ORAL | Status: DC
  Administered 2022-02-19 – 2022-02-22 (×4): 5 mg via ORAL
  Filled 2022-02-18 (×4): qty 1

## 2022-02-18 MED ORDER — SIMETHICONE 80 MG PO CHEW
160.0000 mg | CHEWABLE_TABLET | Freq: Two times a day (BID) | ORAL | Status: DC
  Administered 2022-02-19 – 2022-02-22 (×8): 160 mg via ORAL
  Filled 2022-02-18 (×10): qty 2

## 2022-02-18 MED ORDER — METAXALONE 800 MG PO TABS: 400.0000 mg | ORAL_TABLET | Freq: Four times a day (QID) | ORAL | Status: DC | PRN

## 2022-02-18 MED ORDER — ONDANSETRON HCL 4 MG/2ML IJ SOLN: 4.0000 mg | Freq: Four times a day (QID) | INTRAMUSCULAR | Status: DC | PRN

## 2022-02-18 MED ORDER — ENOXAPARIN SODIUM 30 MG/0.3ML IJ SOSY
30.0000 mg | PREFILLED_SYRINGE | INTRAMUSCULAR | Status: DC
  Administered 2022-02-18 – 2022-02-19 (×2): 30 mg via SUBCUTANEOUS
  Filled 2022-02-18 (×2): qty 0.3

## 2022-02-18 MED ORDER — AMLODIPINE BESYLATE 10 MG PO TABS
10.0000 mg | ORAL_TABLET | Freq: Every day | ORAL | Status: DC
  Administered 2022-02-19 – 2022-02-22 (×4): 10 mg via ORAL
  Filled 2022-02-18: qty 2
  Filled 2022-02-18 (×3): qty 1

## 2022-02-18 MED ORDER — PANTOPRAZOLE SODIUM 40 MG PO TBEC
40.0000 mg | DELAYED_RELEASE_TABLET | Freq: Every day | ORAL | Status: DC
  Administered 2022-02-19 – 2022-02-23 (×5): 40 mg via ORAL
  Filled 2022-02-18 (×5): qty 1

## 2022-02-18 MED ORDER — SENNOSIDES-DOCUSATE SODIUM 8.6-50 MG PO TABS
1.0000 | ORAL_TABLET | Freq: Two times a day (BID) | ORAL | Status: DC
  Administered 2022-02-19 – 2022-02-22 (×7): 1 via ORAL
  Filled 2022-02-18 (×8): qty 1

## 2022-02-18 MED ORDER — ACETAMINOPHEN 325 MG PO TABS
650.0000 mg | ORAL_TABLET | Freq: Four times a day (QID) | ORAL | Status: DC | PRN
  Administered 2022-02-19 – 2022-02-22 (×6): 650 mg via ORAL
  Filled 2022-02-18 (×6): qty 2

## 2022-02-18 MED ORDER — SODIUM CHLORIDE 0.9 % IV SOLN
500.0000 mg | INTRAVENOUS | Status: AC
  Administered 2022-02-18 – 2022-02-22 (×5): 500 mg via INTRAVENOUS
  Filled 2022-02-18 (×3): qty 500
  Filled 2022-02-18 (×2): qty 5

## 2022-02-18 MED ORDER — PREGABALIN 25 MG PO CAPS
25.0000 mg | ORAL_CAPSULE | Freq: Two times a day (BID) | ORAL | Status: DC
  Administered 2022-02-19 – 2022-02-23 (×9): 25 mg via ORAL
  Filled 2022-02-18 (×9): qty 1

## 2022-02-18 MED ORDER — LEVOTHYROXINE SODIUM 50 MCG PO TABS
50.0000 ug | ORAL_TABLET | Freq: Every day | ORAL | Status: DC
  Administered 2022-02-19 – 2022-02-23 (×5): 50 ug via ORAL
  Filled 2022-02-18 (×5): qty 1

## 2022-02-18 MED ORDER — CALCIUM CARBONATE ANTACID 500 MG PO CHEW: 2.0000 | CHEWABLE_TABLET | Freq: Three times a day (TID) | ORAL | Status: DC | PRN

## 2022-02-18 MED ORDER — ONDANSETRON HCL 4 MG PO TABS: 4.0000 mg | ORAL_TABLET | Freq: Four times a day (QID) | ORAL | Status: DC | PRN

## 2022-02-18 NOTE — Assessment & Plan Note (Signed)
-   We will continue her antihypertensives. 

## 2022-02-18 NOTE — Progress Notes (Signed)
PHARMACIST - PHYSICIAN COMMUNICATION  CONCERNING:  Enoxaparin (Lovenox) for DVT Prophylaxis    RECOMMENDATION: Patient was prescribed enoxaprin '40mg'$  q24 hours for VTE prophylaxis.   Filed Weights   02/18/22 2009  Weight: 56.7 kg (125 lb)    Body mass index is 22.14 kg/m.  Estimated Creatinine Clearance: 29.8 mL/min (A) (by C-G formula based on SCr of 1.1 mg/dL (H)).   Patient is candidate for enoxaparin '30mg'$  every 24 hours based on CrCl <32m/min or Weight <45kg  DESCRIPTION: Pharm[facility].  Patient is now receiving enoxaparin 30 mg every 24 hours    CWynelle Cleveland PharmD Clinical Pharmacist  02/18/2022 10:21 PM

## 2022-02-18 NOTE — ED Provider Notes (Signed)
[facility] Provider Note    Event Date/Time   First MD Initiated Contact with Patient 02/18/22 1946     (approximate)   History   Chief Complaint: Respiratory Distress   HPI  Amy Hensley is a 88 y.o. female with a history of chronic respiratory failure on 3 L nasal cannula at home, paroxysmal SVT, hypertension who was brought to the ED due to respiratory distress.  Reportedly her oxygen saturation was 87% on her usual 3 L nasal cannula and she was markedly tachypneic.  She was placed on CPAP during transport to the ED.  She had COVID 2 weeks ago.  Patient denies chest pain.     Physical Exam   Triage Vital Signs: ED Triage Vitals  Enc Vitals Group     BP      Pulse      Resp      Temp      Temp src      SpO2      Weight      Height      Head Circumference      Pe[facility] Flow      Pain Score      Pain Loc      Pain Edu?      Excl. in [place]?     Most recent vital signs: Vitals:   02/18/22 2145 02/18/22 2230  BP:  115/68  Pulse: 100 (!) 108  Resp: 16 (!) 24  Temp:    SpO2: 93% 92%    General: Awake, moderate respiratory distress.  CV:  Good peripheral perfusion.  Tachycardia, heart rate 105.  Symmetric distal pulses Resp:  Tachypnea with a respiratory rate of 25.  Coarse breath sounds diffusely without focal consolidation, no expiratory wheezing Abd:  No distention.  Soft nontender Other:  No lower extremity edema or calf tenderness.   ED Results / Procedures / Treatments   Labs (all labs ordered are listed, but only abnormal results are displayed) Labs Reviewed  COMPREHENSIVE METABOLIC PANEL - Abnormal; Notable for the following components:      Result Value   Glucose, Bld 111 (*)    Creatinine, Ser 1.10 (*)    Calcium 8.3 (*)    Total Protein 5.8 (*)    Albumin 2.6 (*)    GFR, Estimated 49 (*)    All other components within normal limits  CBC WITH DIFFERENTIAL/PLATELET - Abnormal; Notable for the following components:    WBC 21.2 (*)    RBC 3.59 (*)    Hemoglobin 10.1 (*)    HCT 33.5 (*)    RDW 16.9 (*)    Neutro Abs 18.0 (*)    Monocytes Absolute 1.6 (*)    Abs Immature Granulocytes 0.57 (*)    All other components within normal limits  BRAIN NATRIURETIC PEPTIDE - Abnormal; Notable for the following components:   B Natriuretic Peptide 263.1 (*)    All other components within normal limits  BLOOD GAS, VENOUS - Abnormal; Notable for the following components:   pH, Ven 7.47 (*)    pCO2, Ven 42 (*)    Bicarbonate 30.6 (*)    Acid-Base Excess 6.2 (*)    All other components within normal limits  CULTURE, BLOOD (ROUTINE X 2)  CULTURE, BLOOD (ROUTINE X 2)  RESP PANEL BY RT-PCR (FLU A&B, COVID) ARPGX2  LACTIC ACID, PLASMA  PROCALCITONIN  PROTIME-INR  APTT  LACTIC ACID, PLASMA  URINALYSIS, COMPLETE (UACMP) WITH MICROSCOPIC  PROTIME-INR  CORTISOL-AM, BLOOD  PROCALCITONIN  BASIC METABOLIC PANEL  CBC     EKG Interpreted by me Sinus tachycardia rate 109.  Normal axis and intervals.  Normal QRS ST segments and T waves.   RADIOLOGY Chest x-ray interpreted by me, shows infiltrate in the right middle lung and left lower lung, consistent with multifocal pneumonia.  Radiology report reviewed.   PROCEDURES:  .Critical Care  Performed by: Carrie Mew, MD Authorized by: Carrie Mew, MD   Critical care provider statement:    Critical care time (minutes):  35   Critical care time was exclusive of:  Separately billable procedures and treating other patients   Critical care was necessary to treat or prevent imminent or life-threatening deterioration of the following conditions:  Sepsis and respiratory failure   Critical care was time spent personally by me on the following activities:  Development of treatment plan with patient or surrogate, discussions with consultants, evaluation of patient's response to treatment, examination of patient, obtaining history from patient or surrogate,  ordering and performing treatments and interventions, ordering and review of laboratory studies, ordering and review of radiographic studies, pulse oximetry, re-evaluation of patient's condition and review of old charts   Care discussed with: admitting provider      [place] ED: Medications  vancomycin (VANCOCIN) IVPB 1000 mg/200 mL premix (1,000 mg Intravenous New Bag/Given 02/18/22 2246)  amLODipine (NORVASC) tablet 10 mg (has no administration in time range)  calcitonin (salmon) (MIACALCIN/FORTICAL) nasal spray 1 spray (has no administration in time range)  levothyroxine (SYNTHROID) tablet 50 mcg (has no administration in time range)  calcium carbonate (TUMS - dosed in mg elemental calcium) chewable tablet 400 mg of elemental calcium (has no administration in time range)  pantoprazole (PROTONIX) EC tablet 40 mg (has no administration in time range)  magnesium oxide (MAG-OX) tablet 400 mg (has no administration in time range)  meclizine (ANTIVERT) tablet 12.5 mg (has no administration in time range)  polyethylene glycol (MIRALAX / GLYCOLAX) packet 17 g (has no administration in time range)  senna-docusate (Senokot-S) tablet 1 tablet (has no administration in time range)  metaxalone (SKELAXIN) tablet 400 mg (has no administration in time range)  simethicone (MYLICON) chewable tablet 160 mg (has no administration in time range)  pregabalin (LYRICA) capsule 25 mg (has no administration in time range)  cholecalciferol (VITAMIN D3) 25 MCG (1000 UNIT) tablet 1,000 Units (has no administration in time range)  feeding supplement (ENSURE ENLIVE / ENSURE PLUS) liquid 237 mL (has no administration in time range)  multivitamin with minerals tablet 1 tablet (has no administration in time range)  loratadine (CLARITIN) tablet 5 mg (has no administration in time range)  enoxaparin (LOVENOX) injection 30 mg (has no administration in time range)  cefTRIAXone (ROCEPHIN) 2 g in sodium chloride  0.9 % 100 mL IVPB (has no administration in time range)  azithromycin (ZITHROMAX) 500 mg in sodium chloride 0.9 % 250 mL IVPB (has no administration in time range)  acetaminophen (TYLENOL) tablet 650 mg (has no administration in time range)    Or  acetaminophen (TYLENOL) suppository 650 mg (has no administration in time range)  traZODone (DESYREL) tablet 25 mg (has no administration in time range)  ondansetron (ZOFRAN) tablet 4 mg (has no administration in time range)    Or  ondansetron (ZOFRAN) injection 4 mg (has no administration in time range)  magnesium hydroxide (MILK OF MAGNESIA) suspension 30 mL (has no administration in time range)  ceFEPIme (MAXIPIME) 2 g in sodium chloride  0.9 % 100 mL IVPB (0 g Intravenous Stopped 02/18/22 2238)     IMPRESSION / MDM / ASSESSMENT AND PLAN / ED COURSE  I reviewed the triage vital signs and the nursing notes.                              Differential diagnosis includes, but is not limited to, pneumonia, pleural effusion, pulmonary edema, sepsis, acute heart failure, viral illness, AKI, electrolyte abnormality, hypercapnia  Patient's presentation is most consistent with acute presentation with potential threat to life or bodily function.  Patient presents with shortness of breath and hypoxia requiring positive airway pressure respiratory support.  She was transitioned to BiPAP immediately on arrival.  Labs and chest x-ray show multifocal pneumonia.  Leukocytosis, tachycardia tachypnea concerning for sepsis.  Doubt PE ACS dissection or pericardial effusion at this point.  She is improved after initial management, able to wean to 6 L nasal cannula.  Case discussed with hospitalist for further management.       FINAL CLINICAL IMPRESSION(S) / ED DIAGNOSES   Final diagnoses:  Acute on chronic respiratory failure with hypoxia (HCC)  Multifocal pneumonia     Rx / DC Orders   ED Discharge Orders     None        Note:  This document was  prepared using Dragon voice recognition software and may include unintentional dictation errors.   Carrie Mew, MD 02/18/22 2316

## 2022-02-18 NOTE — ED Triage Notes (Signed)
PT to ED via [facility] w/ c/o respiatory distress x40mns. Facility called 911 d/t pt's low spo2. EMS arrived and Pt spo2 was 87 on 3l [place]. [place] currently on CPAP. Lung sound reveal bilat rales throughout. Vitals enroute were bp133/65, P 109, 97.3. Pt was Covid pos x2weeks ago.

## 2022-02-18 NOTE — Assessment & Plan Note (Signed)
-   We will continue Lyrica. 

## 2022-02-18 NOTE — Assessment & Plan Note (Signed)
-   We will continue Synthroid. 

## 2022-02-18 NOTE — Assessment & Plan Note (Signed)
-   This is clearly secondary to her multifocal pneumonia. - It required CPAP and later BiPAP. - O2 protocol will be followed. - Her BiPAP is being tapered to nasal cannula as tolerated.

## 2022-02-18 NOTE — Progress Notes (Signed)
PHARMACY -  BRIEF ANTIBIOTIC NOTE   Pharmacy has received consult(s) for vancomycin and cefepime from an ED provider.  The patient's profile has been reviewed for ht/wt/allergies/indication/available labs.    One time order(s) placed for vancomycin 1,000 mg x 1, cefepime 2 grams x 1  Further antibiotics/pharmacy consults should be ordered by admitting physician if indicated.                       Thank you, Wynelle Cleveland 02/18/2022  8:24 PM

## 2022-02-18 NOTE — ED Notes (Signed)
Patient and family updated on plan of care.

## 2022-02-18 NOTE — H&P (Addendum)
[place]   PATIENT NAME: Amy Hensley    MR#:  [id number]  DATE OF BIRTH:  March 30, 1933  DATE OF ADMISSION:  02/18/2022  PRIMARY CARE PHYSICIAN: Adin Hector, MD   Patient is coming from: [facility]  REQUESTING/REFERRING PHYSICIAN: Brenton Grills, MD  CHIEF COMPLAINT:   Chief Complaint  Patient presents with   Respiratory Distress    HISTORY OF PRESENT ILLNESS:  Amy Hensley is a 88 y.o. Caucasian female with medical history significant for stage III chronic kidney disease, hypertension and hypothyroidism, who presented to the emergency room with acute onset of acute respiratory distress with associated productive cough as well as occasional wheezing and diminished pulse oximetry on her baseline home O2 at 3 L/min to 85% on room air per EMS.  She did not have any reported fever or chills at her SNF.  She was recently treated here for severe sepsis due to bacterial pneumonia and COVID-19 and discharged on 02/12/2022.  No reported nausea or vomiting or abdominal pain.  She was in significant respiratory distress to be placed on CPAP by EMS and upon arrival to the ER on BiPAP.  No dysuria, oliguria or hematuria or flank pain.  ED Course: When she came to the ER, heart rate was 107 and pulse currently was 100% on BiPAP with 60% FiO2 with a BP of 146/70 and later 94/62.  Labs revealed an albumin of 2.6 with a total protein of 5.8 with otherwise unremarkable CMP.  VBG showed pH 7.47 and HCO3 of 30.6.  BNP was 263.1 and procalcitonin 0.16 with lactic acid 1.2.  CBC showed leukocytosis 21.2 with neutrophilia and anemia close to baseline. EKG as reviewed by me : Close to baseline.  EKG showed sinus tachycardia with rate 109 with poor R wave progression Imaging: Portable chest x-ray showed shallow inspiration with increasing infiltration or atelectasis in the right midlung in the left mid/lower lung.  The patient was given IV cefepime and vancomycin.  She will be  admitted to a progressive unit bed for further evaluation and management. PAST MEDICAL HISTORY:   Past Medical History:  Diagnosis Date   Arthritis    Chronic kidney disease    stage 3   Hypertension    Hypothyroidism     PAST SURGICAL HISTORY:   Past Surgical History:  Procedure Laterality Date   ABDOMINAL HYSTERECTOMY     ABDOMINAL SURGERY     tacked up uterus   APPENDECTOMY     ARTERY BIOPSY N/A 05/19/2021   Procedure: BIOPSY TEMPORAL ARTERY;  Surgeon: Herbert Pun, MD;  Location: [facility] ORS;  Service: General;  Laterality: N/A;   CATARACT EXTRACTION W/PHACO Left 10/04/2016   Procedure: CATARACT EXTRACTION PHACO AND INTRAOCULAR LENS PLACEMENT ([place]);  Surgeon: Eulogio Bear, MD;  Location: [facility] ORS;  Service: Ophthalmology;  Laterality: Left;  us02:17.8 ap%19.6 cde27.31 fluid lot # [id number]   CESAREAN SECTION     EYE SURGERY     INGUINAL HERNIA REPAIR Right 12/08/2018   Procedure: OPEN RIGHT HERNIA REPAIR INGUINAL ADULT;  Surgeon: Herbert Pun, MD;  Location: [facility] ORS;  Service: General;  Laterality: Right;   IR INJECT/THERA/INC NEEDLE/CATH/PLC EPI/LUMB/SAC W/IMG  09/26/2021   IR KYPHO EA ADDL LEVEL THORACIC OR LUMBAR  12/14/2021   IR KYPHO LUMBAR INC FX REDUCE BONE BX UNI/BIL CANNULATION INC/IMAGING  10/18/2021   IR KYPHO LUMBAR INC FX REDUCE BONE BX UNI/BIL CANNULATION INC/IMAGING  12/14/2021   IR RADIOLOGIST EVAL & MGMT  11/09/2021    SOCIAL HISTORY:   Social History   Tobacco Use   Smoking status: Former    Packs/day: 1.00    Types: Cigarettes   Smokeless tobacco: Never   Tobacco comments:    Quit 2022  Substance Use Topics   Alcohol use: No    FAMILY HISTORY:  Positive for coronary artery disease in her son  DRUG ALLERGIES:   Allergies  Allergen Reactions   Alendronate Other (See Comments)    Dyspepsia (indigestion)    Buspirone Other (See Comments)    sedation   Codeine     twitching    REVIEW OF SYSTEMS:   ROS As per history of  present illness. All pertinent systems were reviewed above. Constitutional, HEENT, cardiovascular, respiratory, GI, GU, musculoskeletal, neuro, psychiatric, endocrine, integumentary and hematologic systems were reviewed and are otherwise negative/unremarkable except for positive findings mentioned above in the HPI.   MEDICATIONS AT HOME:   Prior to Admission medications   Medication Sig Start Date End Date Taking? Authorizing Provider  acetaminophen (ACETAMINOPHEN 8 HOUR) 650 MG CR tablet Take 650 mg by mouth every 8 (eight) hours as needed for pain.    [provider]  amLODipine (NORVASC) 10 MG tablet Take 10 mg by mouth daily.    [provider]  amoxicillin-clavulanate (AUGMENTIN) 875-125 MG tablet Take 1 tablet by mouth every 12 (twelve) hours. 02/12/22   Annita Brod, MD  calcitonin, salmon, (MIACALCIN/FORTICAL) 200 UNIT/ACT nasal spray Place 1 spray into alternate nostrils daily.    [provider]  calcium carbonate (TUMS EX) 750 MG chewable tablet Chew 2 tablets by mouth 3 (three) times daily as needed for heartburn.    [provider]  Camphor-Menthol-Methyl Sal (SALONPAS) 3.05-19-08 % PTCH Apply 1 patch topically daily. (Apply to lower back)    [provider]  cholecalciferol (VITAMIN D) 25 MCG (1000 UNIT) tablet Take 1,000 Units by mouth daily.    [provider]  dexamethasone (DECADRON) 6 MG tablet Take 1 tablet (6 mg total) by mouth daily. 02/13/22   Annita Brod, MD  Dextromethorphan-Guaifenesin 60-1200 MG 12hr tablet Take 1 tablet by mouth every 12 (twelve) hours.    [provider]  esomeprazole (NEXIUM) 20 MG capsule Take 20 mg by mouth every morning. 05/18/21 05/18/22  [provider]  feeding supplement (ENSURE ENLIVE / ENSURE PLUS) LIQD Take 237 mLs by mouth 3 (three) times daily between meals. 11/06/21   Nolberto Hanlon, MD  ipratropium-albuterol (DUONEB) 0.5-2.5 (3) MG/3ML SOLN Take 3 mLs by  nebulization every 6 (six) hours. 02/05/22 02/12/22  [provider]  levothyroxine (SYNTHROID) 50 MCG tablet Take 50 mcg by mouth daily before breakfast.     [provider]  loratadine (CLARITIN) 10 MG tablet Take 0.5 tablets (5 mg total) by mouth at bedtime. 02/12/22   Annita Brod, MD  magnesium oxide (MAG-OX) 400 MG tablet Take 400 mg by mouth daily.    [provider]  meclizine (ANTIVERT) 25 MG tablet Take 0.5 tablets (12.5 mg total) by mouth 3 (three) times daily as needed for dizziness. 02/12/22   Annita Brod, MD  metaxalone (SKELAXIN) 800 MG tablet Take 400 mg by mouth every 6 (six) hours as needed for muscle spasms.    [provider]  Multiple Vitamin (MULTIVITAMIN WITH MINERALS) TABS tablet Take 1 tablet by mouth daily. 02/13/22   Annita Brod, MD  ondansetron (ZOFRAN-ODT) 4 MG disintegrating tablet Take 4 mg by mouth  every 4 (four) hours as needed for nausea or vomiting.    [provider]  polyethylene glycol (MIRALAX / GLYCOLAX) 17 g packet Take 17 g by mouth daily.    [provider]  pregabalin (LYRICA) 25 MG capsule Take 25 mg by mouth every 12 (twelve) hours.    [provider]  sennosides-docusate sodium (SENOKOT-S) 8.6-50 MG tablet Take 1 tablet by mouth 2 (two) times daily.    [provider]  Simethicone 80 MG TABS Take 160 mg by mouth 2 (two) times daily.    [provider]      VITAL SIGNS:  Blood pressure 115/68, pulse (!) 108, temperature (!) 97.3 F (36.3 C), temperature source Axillary, resp. rate (!) 24, height '5\' 3"'$  (1.6 m), weight 56.7 kg, SpO2 92 %.  PHYSICAL EXAMINATION:  Physical Exam  GENERAL:  88 y.o.-year-old Caucasian female patient semi-lying in the bed with moderate respiratory distress with conversational dyspnea. EYES: Pupils equal, round, reactive to light and accommodation. No scleral icterus. Extraocular muscles intact.  HEENT: Head atraumatic,  normocephalic. Oropharynx and nasopharynx clear.  NECK:  Supple, no jugular venous distention. No thyroid enlargement, no tenderness.  LUNGS: Diminished bibasal breath sounds with bibasal crackles.  No use of accessory muscles of respiration.  CARDIOVASCULAR: Regular rate and rhythm, S1, S2 normal. No murmurs, rubs, or gallops.  ABDOMEN: Soft, nondistended, nontender. Bowel sounds present. No organomegaly or mass.  EXTREMITIES: No pedal edema, cyanosis, or clubbing.  NEUROLOGIC: Cranial nerves II through XII are intact. Muscle strength 5/5 in all extremities. Sensation intact. Gait not checked.  PSYCHIATRIC: The patient is alert and oriented x 3.  Normal affect and good eye contact. SKIN: No obvious rash, lesion, or ulcer.   LABORATORY PANEL:   CBC Recent Labs  Lab 02/18/22 1947  WBC 21.2*  HGB 10.1*  HCT 33.5*  PLT 371   ------------------------------------------------------------------------------------------------------------------  Chemistries  Recent Labs  Lab 02/12/22 0537 02/18/22 1947  NA 133* 136  K 3.6 4.6  CL 100 102  CO2 24 25  GLUCOSE 87 111*  BUN 37* 23  CREATININE 0.96 1.10*  CALCIUM 9.0 8.3*  MG 1.8  --   AST  --  18  ALT  --  15  ALKPHOS  --  72  BILITOT  --  0.8   ------------------------------------------------------------------------------------------------------------------  Cardiac Enzymes No results for input(s): "TROPONINI" in the last 168 hours. ------------------------------------------------------------------------------------------------------------------  RADIOLOGY:  DG Chest Port 1 View  Result Date: 02/18/2022 CLINICAL DATA:  Questionable sepsis.  Evaluate for abnormality. EXAM: PORTABLE CHEST 1 VIEW COMPARISON:  02/06/2022 FINDINGS: Shallow inspiration. Increasing atelectasis or infiltration in the right mid lung and left mid/lower lungs. Probable small left pleural effusion is developing. No pneumothorax. Heart size and pulmonary  vascularity are normal. Calcification of the aorta. Degenerative changes in the spine with kyphoplasty changes in the lumbar region. IMPRESSION: Shallow inspiration. Increasing infiltration or atelectasis in the right mid lung and left mid/lower lung. Electronically Signed   By: Lucienne Capers M.D.   On: 02/18/2022 20:03      IMPRESSION AND PLAN:  Assessment and Plan: * Acute on chronic respiratory failure with hypoxia (HCC) - This is clearly secondary to her multifocal pneumonia. - It required CPAP and later BiPAP. - O2 protocol will be followed. - Her BiPAP is being tapered to nasal cannula as tolerated.  Sepsis due to pneumonia [facility]) - The patient will be admitted to a progressive unit bed. - Sepsis manifested by leukocytosis, tachycardia that  was up to 108 and tachypnea that was up to 24. - We will continue antibiotic therapy with IV Rocephin and Zithromax. - Mucolytic therapy will be provided. - She will be placed on bronchodilator therapy on a scheduled and as needed basis. - We will obtain sputum culture and follow blood cultures.  Hypothyroidism - We will continue Synthroid.  Peripheral neuropathy - We will continue Lyrica.  GERD without esophagitis - We will continue PPI therapy.  Essential hypertension - We will continue her antihypertensives.       DVT prophylaxis: Lovenox.  Advanced Care Planning:  Code Status: She is DNR/DNI.  This was discussed with her and her daughter..  Family Communication:  The plan of care was discussed in details with the patient (and family). I answered all questions. The patient agreed to proceed with the above mentioned plan. Further management will depend upon hospital course. Disposition Plan: Back to previous home environment Consults called: none.  All the records are reviewed and case discussed with ED provider.  Status is: Inpatient    At the time of the admission, it appears that the appropriate admission status for  this patient is inpatient.  This is judged to be reasonable and necessary in order to provide the required intensity of service to ensure the patient's safety given the presenting symptoms, physical exam findings and initial radiographic and laboratory data in the context of comorbid conditions.  The patient requires inpatient status due to high intensity of service, high risk of further deterioration and high frequency of surveillance required.  I certify that at the time of admission, it is my clinical judgment that the patient will require inpatient hospital care extending more than 2 midnights.                            Dispo: The patient is from: [facility]              Anticipated d/c is to: [facility]              Patient currently is not medically stable to d/c.              Difficult to place patient: No  Christel Mormon M.D on 02/18/2022 at 11:15 PM  [facility]   From 7 PM-7 AM, contact night-coverage [email or web address]  CC: Primary care physician; Adin Hector, MD

## 2022-02-18 NOTE — Assessment & Plan Note (Addendum)
-   The patient will be admitted to a progressive unit bed. - Sepsis manifested by leukocytosis, tachycardia that was up to 108 and tachypnea that was up to 24. - We will continue antibiotic therapy with IV Rocephin and Zithromax. - Mucolytic therapy will be provided. - She will be placed on bronchodilator therapy on a scheduled and as needed basis. - We will obtain sputum culture and follow blood cultures.

## 2022-02-18 NOTE — Assessment & Plan Note (Signed)
-   We will continue PPI therapy 

## 2022-02-18 NOTE — ED Notes (Signed)
BIPAP removed at this time and patient placed on 6 L [place] per order.  RT aware and will continue to monitor patient.  Per family, patient normally wears 5 L [place] at home

## 2022-02-19 ENCOUNTER — Other Ambulatory Visit: Payer: Self-pay

## 2022-02-19 DIAGNOSIS — J9621 Acute and chronic respiratory failure with hypoxia: Secondary | ICD-10-CM | POA: Diagnosis not present

## 2022-02-19 LAB — URINALYSIS, COMPLETE (UACMP) WITH MICROSCOPIC
Bilirubin Urine: NEGATIVE
Glucose, UA: NEGATIVE mg/dL
Hgb urine dipstick: NEGATIVE
Ketones, ur: NEGATIVE mg/dL
Leukocytes,Ua: NEGATIVE
Nitrite: NEGATIVE
Protein, ur: 100 mg/dL — AB
Specific Gravity, Urine: 1.023 (ref 1.005–1.030)
pH: 7 (ref 5.0–8.0)

## 2022-02-19 LAB — CBC
HCT: 31.8 % — ABNORMAL LOW (ref 36.0–46.0)
Hemoglobin: 9.9 g/dL — ABNORMAL LOW (ref 12.0–15.0)
MCH: 28.2 pg (ref 26.0–34.0)
MCHC: 31.1 g/dL (ref 30.0–36.0)
MCV: 90.6 fL (ref 80.0–100.0)
Platelets: 368 10*3/uL (ref 150–400)
RBC: 3.51 MIL/uL — ABNORMAL LOW (ref 3.87–5.11)
RDW: 16.8 % — ABNORMAL HIGH (ref 11.5–15.5)
WBC: 17.2 10*3/uL — ABNORMAL HIGH (ref 4.0–10.5)
nRBC: 0 % (ref 0.0–0.2)

## 2022-02-19 LAB — BASIC METABOLIC PANEL
Anion gap: 8 (ref 5–15)
BUN: 24 mg/dL — ABNORMAL HIGH (ref 8–23)
CO2: 26 mmol/L (ref 22–32)
Calcium: 8.6 mg/dL — ABNORMAL LOW (ref 8.9–10.3)
Chloride: 100 mmol/L (ref 98–111)
Creatinine, Ser: 1.07 mg/dL — ABNORMAL HIGH (ref 0.44–1.00)
GFR, Estimated: 50 mL/min — ABNORMAL LOW (ref >60)
Glucose, Bld: 103 mg/dL — ABNORMAL HIGH (ref 70–99)
Potassium: 4.4 mmol/L (ref 3.5–5.1)
Sodium: 134 mmol/L — ABNORMAL LOW (ref 135–145)

## 2022-02-19 LAB — PROCALCITONIN: Procalcitonin: 0.29 ng/mL

## 2022-02-19 LAB — PROTIME-INR
INR: 1.1 (ref 0.8–1.2)
Prothrombin Time: 13.8 seconds (ref 11.4–15.2)

## 2022-02-19 LAB — CORTISOL-AM, BLOOD: Cortisol - AM: 13.9 ug/dL (ref 6.7–22.6)

## 2022-02-19 MED ORDER — IPRATROPIUM-ALBUTEROL 0.5-2.5 (3) MG/3ML IN SOLN
3.0000 mL | Freq: Four times a day (QID) | RESPIRATORY_TRACT | Status: DC | PRN
  Administered 2022-02-19: 3 mL via RESPIRATORY_TRACT
  Filled 2022-02-19: qty 3

## 2022-02-19 MED ORDER — CALCITONIN (SALMON) 200 UNIT/ACT NA SOLN
1.0000 | Freq: Every day | NASAL | Status: DC
  Administered 2022-02-20 – 2022-02-22 (×3): 1 via NASAL
  Filled 2022-02-19: qty 3.7

## 2022-02-19 MED ORDER — OXYCODONE HCL 5 MG PO TABS
5.0000 mg | ORAL_TABLET | Freq: Four times a day (QID) | ORAL | Status: DC | PRN
  Administered 2022-02-19 – 2022-02-23 (×14): 5 mg via ORAL
  Filled 2022-02-19 (×14): qty 1

## 2022-02-19 NOTE — Progress Notes (Signed)
PROGRESS NOTE    Amy Hensley   ZOX:[id number] DOB: [birth year]  DOA: 02/18/2022 Date of Service: 02/19/22 PCP: Adin Hector, MD     Brief Narrative / Hospital Course:  Amy Hensley is a 88 y.o. Caucasian female with medical history significant for stage III chronic kidney disease, hypertension and hypothyroidism, who presented to the emergency room with acute onset of acute respiratory distress with associated productive cough as well as occasional wheezing and diminished pulse oximetry on her baseline home O2 at 3 L/min to 85% on room air per EMS.  She did not have any reported fever or chills at her SNF.  She was recently treated here for severe sepsis due to bacterial pneumonia and COVID-19 and discharged on 02/12/2022.  No reported nausea or vomiting or abdominal pain.   10/08: She was in significant respiratory distress to be placed on CPAP by EMS and upon arrival to the ER on BiPAP.  Chest x-ray showed shallow inspiration with increasing infiltration or atelectasis in the right midlung in the left mid/lower lung. IV cefepime and vancomycin started for multifocal pneumonia w/ sepsis (tachycardia/tachypnea, WBC 21.2). Pt admitted.  10/09: tachycardia/tachypnea resolved, SpO2 98% on 6L . WBC improved to 17.2.     Consultants:  none  Procedures: none      ASSESSMENT & PLAN:   Principal Problem:   Acute on chronic respiratory failure with hypoxia (HCC) Active Problems:   Sepsis due to pneumonia ([place])   Hypothyroidism   Essential hypertension   GERD without esophagitis   Peripheral neuropathy   Sepsis due to pneumonia (HCC) IV Rocephin and Zithromax. Mucolytic  bronchodilator therapy on a scheduled and as needed basis. sputum culture  follow blood cultures.  Acute on chronic (baseline 3 L/min) respiratory failure with hypoxia (HCC) Secondary to her multifocal pneumonia. BiPAP tapered to nasal cannula as tolerated.  Essential hypertension continue her  antihypertensives.  Hypothyroidism continue Synthroid.  GERD without esophagitis continue PPI   Peripheral neuropathy continue Lyrica.      DVT prophylaxis: Lovenox Pertinent IV fluids/nutrition: sepsis fluids administered, not currently on continuous IV fluids  Central lines / invasive devices: none  Code Status: DNR Family Communication: husband at bedside on rounds in ED   Disposition: inpatient, expect discharge back to [facility] TOC needs: none at this time Barriers to discharge / significant pending items: treating sepsis, IV abx, awaiting cultures, awaiting respiratory improvement              Subjective:  Patient reports improvement in SOB but not resolution. Concern for coughing. NO CP/        Objective:  Vitals:   02/19/22 0800 02/19/22 0900 02/19/22 0929 02/19/22 1027  BP: (!) 107/57 93/73 109/62   Pulse: 84 98    Resp: 16 (!) 22    Temp:    97.9 F (36.6 C)  TempSrc:    Oral  SpO2: 97% 96%    Weight:      Height:        Intake/Output Summary (Last 24 hours) at 02/19/2022 1206 Last data filed at 02/19/2022 0048 Gross per 24 hour  Intake 550 ml  Output --  Net 550 ml   Filed Weights   02/18/22 2009  Weight: 56.7 kg    Examination:  Constitutional:  VS as above General Appearance: alert, NAD Neck: No masses, trachea midline Respiratory: Normal respiratory effort No wheeze + rhonchi especially on L + basliar rales Cardiovascular: S1/S2 normal RRR No rub/gallop auscultated  No lower extremity edema Gastrointestinal: No tenderness Musculoskeletal:  Symmetrical movement in all extremities Neurological: No cranial nerve deficit on limited exam Alert Psychiatric: Normal judgment/insight Normal mood and affect       Scheduled Medications:   amLODipine  10 mg Oral Daily   [START ON 02/20/2022] calcitonin (salmon)  1 spray Alternating Nares Daily   cholecalciferol  1,000 Units Oral Daily   enoxaparin (LOVENOX)  injection  30 mg Subcutaneous Q24H   feeding supplement  237 mL Oral TID BM   levothyroxine  50 mcg Oral Q0600   loratadine  5 mg Oral QHS   magnesium oxide  400 mg Oral Daily   multivitamin with minerals  1 tablet Oral Daily   pantoprazole  40 mg Oral Daily   polyethylene glycol  17 g Oral Daily   pregabalin  25 mg Oral Q12H   senna-docusate  1 tablet Oral BID   simethicone  160 mg Oral BID    Continuous Infusions:  azithromycin Stopped (02/19/22 0048)   cefTRIAXone (ROCEPHIN)  IV      PRN Medications:  acetaminophen **OR** acetaminophen, calcium carbonate, ipratropium-albuterol, magnesium hydroxide, meclizine, metaxalone, ondansetron **OR** ondansetron (ZOFRAN) IV, oxyCODONE, traZODone  Antimicrobials:  Anti-infectives (From admission, onward)    Start     Dose/Rate Route Frequency Ordered Stop   02/19/22 2200  cefTRIAXone (ROCEPHIN) 2 g in sodium chloride 0.9 % 100 mL IVPB        2 g 200 mL/hr over 30 Minutes Intravenous Every 24 hours 02/18/22 2216 02/24/22 2159   02/18/22 2230  azithromycin (ZITHROMAX) 500 mg in sodium chloride 0.9 % 250 mL IVPB        500 mg 250 mL/hr over 60 Minutes Intravenous Every 24 hours 02/18/22 2216 02/23/22 2229   02/18/22 2030  vancomycin (VANCOCIN) IVPB 1000 mg/200 mL premix        1,000 mg 200 mL/hr over 60 Minutes Intravenous  Once 02/18/22 2017 02/18/22 2345   02/18/22 2030  ceFEPIme (MAXIPIME) 2 g in sodium chloride 0.9 % 100 mL IVPB        2 g 200 mL/hr over 30 Minutes Intravenous  Once 02/18/22 2017 02/18/22 2238       Data Reviewed: I have personally reviewed following labs and imaging studies  CBC: Recent Labs  Lab 02/18/22 1947 02/19/22 0622  WBC 21.2* 17.2*  NEUTROABS 18.0*  --   HGB 10.1* 9.9*  HCT 33.5* 31.8*  MCV 93.3 90.6  PLT 371 203   Basic Metabolic Panel: Recent Labs  Lab 02/18/22 1947 02/19/22 0622  NA 136 134*  K 4.6 4.4  CL 102 100  CO2 25 26  GLUCOSE 111* 103*  BUN 23 24*  CREATININE 1.10* 1.07*   CALCIUM 8.3* 8.6*   GFR: Estimated Creatinine Clearance: 30.6 mL/min (A) (by C-G formula based on SCr of 1.07 mg/dL (H)). Liver Function Tests: Recent Labs  Lab 02/18/22 1947  AST 18  ALT 15  ALKPHOS 72  BILITOT 0.8  PROT 5.8*  ALBUMIN 2.6*   No results for input(s): "LIPASE", "AMYLASE" in the last 168 hours. No results for input(s): "AMMONIA" in the last 168 hours. Coagulation Profile: Recent Labs  Lab 02/18/22 2018 02/19/22 0622  INR 1.0 1.1   Cardiac Enzymes: No results for input(s): "CKTOTAL", "CKMB", "CKMBINDEX", "TROPONINI" in the last 168 hours. BNP (last 3 results) No results for input(s): "PROBNP" in the last 8760 hours. HbA1C: No results for input(s): "HGBA1C" in the last 72 hours. CBG: No  results for input(s): "GLUCAP" in the last 168 hours. Lipid Profile: No results for input(s): "CHOL", "HDL", "LDLCALC", "TRIG", "CHOLHDL", "LDLDIRECT" in the last 72 hours. Thyroid Function Tests: No results for input(s): "TSH", "T4TOTAL", "FREET4", "T3FREE", "THYROIDAB" in the last 72 hours. Anemia Panel: No results for input(s): "VITAMINB12", "FOLATE", "FERRITIN", "TIBC", "IRON", "RETICCTPCT" in the last 72 hours. Urine analysis:    Component Value Date/Time   COLORURINE YELLOW (A) 02/19/2022 0358   APPEARANCEUR CLEAR (A) 02/19/2022 0358   LABSPEC 1.023 02/19/2022 0358   PHURINE 7.0 02/19/2022 0358   GLUCOSEU NEGATIVE 02/19/2022 0358   HGBUR NEGATIVE 02/19/2022 0358   BILIRUBINUR NEGATIVE 02/19/2022 0358   KETONESUR NEGATIVE 02/19/2022 0358   PROTEINUR 100 (A) 02/19/2022 0358   NITRITE NEGATIVE 02/19/2022 0358   LEUKOCYTESUR NEGATIVE 02/19/2022 0358   Sepsis Labs: '@LABRCNTIP'$ (procalcitonin:4,lacticidven:4)  Recent Results (from the past 240 hour(s))  Resp Panel by RT-PCR (Flu A&B, Covid) Anterior Nasal Swab     Status: Abnormal   Collection Time: 02/18/22  7:48 PM   Specimen: Anterior Nasal Swab  Result Value Ref Range Status   SARS Coronavirus 2 by RT PCR  POSITIVE (A) NEGATIVE Final    Comment: (NOTE) SARS-CoV-2 target nucleic acids are DETECTED.  The SARS-CoV-2 RNA is generally detectable in upper respiratory specimens during the acute phase of infection. Positive results are indicative of the presence of the identified virus, but do not rule out bacterial infection or co-infection with other pathogens not detected by the test. Clinical correlation with patient history and other diagnostic information is necessary to determine patient infection status. The expected result is Negative.  Fact Sheet for Patients: EntrepreneurPulse.com.au  Fact Sheet for Healthcare Providers: IncredibleEmployment.be  This test is not yet approved or cleared by the [place] FDA and  has been authorized for detection and/or diagnosis of SARS-CoV-2 by FDA under an Emergency Use Authorization (EUA).  This EUA will remain in effect (meaning this test can be used) for the duration of  the COVID-19 declaration under Section 564(b)(1) of the A ct, 21 U.S.C. section 360bbb-3(b)(1), unless the authorization is terminated or revoked sooner.     Influenza A by PCR NEGATIVE NEGATIVE Final   Influenza B by PCR NEGATIVE NEGATIVE Final    Comment: (NOTE) The Xpert Xpress SARS-CoV-2/FLU/RSV plus assay is intended as an aid in the diagnosis of influenza from Nasopharyngeal swab specimens and should not be used as a sole basis for treatment. Nasal washings and aspirates are unacceptable for Xpert Xpress SARS-CoV-2/FLU/RSV testing.  Fact Sheet for Patients: EntrepreneurPulse.com.au  Fact Sheet for Healthcare Providers: IncredibleEmployment.be  This test is not yet approved or cleared by the [place] FDA and has been authorized for detection and/or diagnosis of SARS-CoV-2 by FDA under an Emergency Use Authorization (EUA). This EUA will remain in effect (meaning this test can be used)  for the duration of the COVID-19 declaration under Section 564(b)(1) of the Act, 21 U.S.C. section 360bbb-3(b)(1), unless the authorization is terminated or revoked.  Performed at [facility], [address] [place],  [address]   Blood Culture (routine x 2)     Status: None (Preliminary result)   Collection Time: 02/18/22  8:18 PM   Specimen: BLOOD  Result Value Ref Range Status   Specimen Description BLOOD LEFT ASSIST CONTROL  Final   Special Requests   Final    BOTTLES DRAWN AEROBIC AND ANAEROBIC Blood Culture results may not be optimal due to an inadequate volume of blood received in culture  bottles   Culture   Final    NO GROWTH < 12 HOURS Performed at [facility], [address] [place], [place] [address]    Report Status PENDING  Incomplete  Blood Culture (routine x 2)     Status: None (Preliminary result)   Collection Time: 02/18/22  8:40 PM   Specimen: BLOOD  Result Value Ref Range Status   Specimen Description BLOOD RIGHT ASSIST CONTROL  Final   Special Requests   Final    BOTTLES DRAWN AEROBIC AND ANAEROBIC Blood Culture adequate volume   Culture   Final    NO GROWTH < 12 HOURS Performed at [facility], [address], [place],  [address]    Report Status PENDING  Incomplete         Radiology Studies: DG Chest Port 1 View  Result Date: 02/18/2022 CLINICAL DATA:  Questionable sepsis.  Evaluate for abnormality. EXAM: PORTABLE CHEST 1 VIEW COMPARISON:  02/06/2022 FINDINGS: Shallow inspiration. Increasing atelectasis or infiltration in the right mid lung and left mid/lower lungs. Probable small left pleural effusion is developing. No pneumothorax. Heart size and pulmonary vascularity are normal. Calcification of the aorta. Degenerative changes in the spine with kyphoplasty changes in the lumbar region. IMPRESSION: Shallow inspiration. Increasing infiltration or atelectasis in the right mid lung and left mid/lower lung.  Electronically Signed   By: Lucienne Capers M.D.   On: 02/18/2022 20:03            LOS: 1 day       Emeterio Reeve, DO [facility] 02/19/2022, 12:06 PM   Staff may message me via secure chat in [place]  but this may not receive immediate response,  please page for urgent matters!  If 7PM-7AM, please contact night-coverage [email or web address]  Dictation software was used to generate the above note. Typos may occur and escape review, as with typed/written notes. Please contact Dr Sheppard Coil directly for clarity if needed.

## 2022-02-19 NOTE — Hospital Course (Addendum)
Amy Hensley is a 88 y.o. Caucasian female with medical history significant for stage III chronic kidney disease, hypertension and hypothyroidism, who presented to the emergency room with acute onset of acute respiratory distress with associated productive cough as well as occasional wheezing and diminished pulse oximetry on her baseline home O2 at 3 L/min to 85% on room air per EMS.  She did not have any reported fever or chills at her SNF.  She was recently treated here for severe sepsis due to bacterial pneumonia and COVID-19 and discharged on 02/12/2022.  No reported nausea or vomiting or abdominal pain.   10/08: She was in significant respiratory distress to be placed on CPAP by EMS and upon arrival to the ER on BiPAP.  Chest x-ray showed shallow inspiration with increasing infiltration or atelectasis in the right midlung in the left mid/lower lung. IV cefepime and vancomycin started for multifocal pneumonia w/ sepsis (tachycardia/tachypnea, WBC 21.2). Pt admitted.  10/09: tachycardia/tachypnea resolved, SpO2 98% on 6L [place]. WBC improved to 17.2.  10/10: WBC still improving to 15. BP soft. BCx NG <12h as of this morning.  Still requiring 5L/min O2 via [place].     Consultants:  none  Procedures: none      ASSESSMENT & PLAN:   Principal Problem:   Acute on chronic respiratory failure with hypoxia (HCC) Active Problems:   Sepsis due to pneumonia [facility])   Hypothyroidism   Essential hypertension   GERD without esophagitis   Peripheral neuropathy   Sepsis due to pneumonia ([place]) Recently COVID(+) initially positive test 02/06/22 (approx 2 weeks PTA) - likely cleared  IV Rocephin and Zithromax. Mucolytic  bronchodilator therapy on a scheduled and as needed basis. sputum culture  follow blood cultures  Acute on chronic (baseline 3 L/min) respiratory failure with hypoxia (HCC) Secondary to her multifocal pneumonia. BiPAP tapered to nasal cannula as tolerated. Currently on 5L/min,  continue taper to baseline as able   Essential hypertension continue her antihypertensives.  Hypothyroidism continue Synthroid.  GERD without esophagitis continue PPI   Peripheral neuropathy continue Lyrica.      DVT prophylaxis: Lovenox Pertinent IV fluids/nutrition: sepsis fluids administered, not currently on continuous IV fluids  Central lines / invasive devices: none  Code Status: DNR Family Communication: husband at bedside on rounds   Disposition: inpatient, expect discharge back to [facility] for rehab TOC needs: none at this time Barriers to discharge / significant pending items: treating sepsis, IV abx, awaiting blood cultures, awaiting respiratory improvement in O2 requirement

## 2022-02-19 NOTE — ED Notes (Signed)
Patient reports improvement in HA

## 2022-02-19 NOTE — ED Notes (Signed)
Patient reports increasing difficulty breathing.  RR equal and unlabored.  Sats have remained stable on 6 L [place].  More congestion and coughing noted on exam.  NP made aware and order received for DuoNeb.

## 2022-02-20 DIAGNOSIS — J9621 Acute and chronic respiratory failure with hypoxia: Secondary | ICD-10-CM | POA: Diagnosis not present

## 2022-02-20 LAB — BASIC METABOLIC PANEL
Anion gap: 8 (ref 5–15)
BUN: 23 mg/dL (ref 8–23)
CO2: 23 mmol/L (ref 22–32)
Calcium: 8.3 mg/dL — ABNORMAL LOW (ref 8.9–10.3)
Chloride: 104 mmol/L (ref 98–111)
Creatinine, Ser: 1.03 mg/dL — ABNORMAL HIGH (ref 0.44–1.00)
GFR, Estimated: 53 mL/min — ABNORMAL LOW (ref >60)
Glucose, Bld: 89 mg/dL (ref 70–99)
Potassium: 4.1 mmol/L (ref 3.5–5.1)
Sodium: 135 mmol/L (ref 135–145)

## 2022-02-20 LAB — CBC
HCT: 26.3 % — ABNORMAL LOW (ref 36.0–46.0)
Hemoglobin: 8.2 g/dL — ABNORMAL LOW (ref 12.0–15.0)
MCH: 28.5 pg (ref 26.0–34.0)
MCHC: 31.2 g/dL (ref 30.0–36.0)
MCV: 91.3 fL (ref 80.0–100.0)
Platelets: 330 10*3/uL (ref 150–400)
RBC: 2.88 MIL/uL — ABNORMAL LOW (ref 3.87–5.11)
RDW: 16.7 % — ABNORMAL HIGH (ref 11.5–15.5)
WBC: 15.2 10*3/uL — ABNORMAL HIGH (ref 4.0–10.5)
nRBC: 0 % (ref 0.0–0.2)

## 2022-02-20 MED ORDER — LIDOCAINE 5 % EX PTCH
1.0000 | MEDICATED_PATCH | CUTANEOUS | Status: DC
  Administered 2022-02-20 – 2022-02-23 (×4): 1 via TRANSDERMAL
  Filled 2022-02-20 (×4): qty 1

## 2022-02-20 MED ORDER — TRAMADOL HCL 50 MG PO TABS
50.0000 mg | ORAL_TABLET | Freq: Once | ORAL | Status: AC
  Administered 2022-02-20: 50 mg via ORAL
  Filled 2022-02-20: qty 1

## 2022-02-20 MED ORDER — ENOXAPARIN SODIUM 40 MG/0.4ML IJ SOSY
40.0000 mg | PREFILLED_SYRINGE | INTRAMUSCULAR | Status: DC
  Administered 2022-02-20: 40 mg via SUBCUTANEOUS
  Filled 2022-02-20: qty 0.4

## 2022-02-20 NOTE — Progress Notes (Signed)
PROGRESS NOTE    Amy Hensley   YBO:[id number] DOB: 1932-12-27  DOA: 02/18/2022 Date of Service: 02/20/22 PCP: Adin Hector, MD     Brief Narrative / Hospital Course:  Amy Hensley is a 88 y.o. Caucasian female with medical history significant for stage III chronic kidney disease, hypertension and hypothyroidism, who presented to the emergency room with acute onset of acute respiratory distress with associated productive cough as well as occasional wheezing and diminished pulse oximetry on her baseline home O2 at 3 L/min to 85% on room air per EMS.  She did not have any reported fever or chills at her SNF.  She was recently treated here for severe sepsis due to bacterial pneumonia and COVID-19 and discharged on 02/12/2022.  No reported nausea or vomiting or abdominal pain.   10/08: She was in significant respiratory distress to be placed on CPAP by EMS and upon arrival to the ER on BiPAP.  Chest x-ray showed shallow inspiration with increasing infiltration or atelectasis in the right midlung in the left mid/lower lung. IV cefepime and vancomycin started for multifocal pneumonia w/ sepsis (tachycardia/tachypnea, WBC 21.2). Pt admitted.  10/09: tachycardia/tachypnea resolved, SpO2 98% on 6L [place]. WBC improved to 17.2.  10/10: WBC still improving to 15. BP soft. BCx NG <12h as of this morning.  Still requiring 5L/min O2 via [place].     Consultants:  none  Procedures: none      ASSESSMENT & PLAN:   Principal Problem:   Acute on chronic respiratory failure with hypoxia (HCC) Active Problems:   Sepsis due to pneumonia [facility])   Hypothyroidism   Essential hypertension   GERD without esophagitis   Peripheral neuropathy   Sepsis due to pneumonia ([place]) Recently COVID(+) initially positive test 02/06/22 (approx 2 weeks PTA) - likely cleared  IV Rocephin and Zithromax. Mucolytic  bronchodilator therapy on a scheduled and as needed basis. sputum culture  follow blood  cultures  Acute on chronic (baseline 3 L/min) respiratory failure with hypoxia (HCC) Secondary to her multifocal pneumonia. BiPAP tapered to nasal cannula as tolerated. Currently on 5L/min, continue taper to baseline as able   Essential hypertension continue her antihypertensives.  Hypothyroidism continue Synthroid.  GERD without esophagitis continue PPI   Peripheral neuropathy continue Lyrica.      DVT prophylaxis: Lovenox Pertinent IV fluids/nutrition: sepsis fluids administered, not currently on continuous IV fluids  Central lines / invasive devices: none  Code Status: DNR Family Communication: husband at bedside on rounds   Disposition: inpatient, expect discharge back to [facility] for rehab TOC needs: none at this time Barriers to discharge / significant pending items: treating sepsis, IV abx, awaiting blood cultures, awaiting respiratory improvement in O2 requirement             Subjective:  Pt still feeling weak/SOB.  A bit improved from yesterday.  No fever/chills.       Objective:  Vitals:   02/19/22 2115 02/19/22 2300 02/20/22 0100 02/20/22 0525  BP: 126/67 (!) 121/57  (!) 115/51  Pulse: (!) 110 (!) 113  95  Resp: '18 20  18  '$ Temp: 98.6 F (37 C) (!) 100.7 F (38.2 C) 98.4 F (36.9 C) 97.7 F (36.5 C)  TempSrc: Oral Oral Oral Oral  SpO2: 94% 98%  92%  Weight:      Height:       No intake or output data in the 24 hours ending 02/20/22 1326  Filed Weights   02/18/22 2009  Weight:  56.7 kg    Examination:  Constitutional:  VS as above General Appearance: alert, NAD Neck: No masses, trachea midline Respiratory: Normal respiratory effort No wheeze + rhonchi especially on L - improved from yesterday  No rales Cardiovascular: S1/S2 normal RRR No rub/gallop auscultated No lower extremity edema Gastrointestinal: No tenderness Musculoskeletal:  Symmetrical movement in all extremities Neurological: No cranial nerve deficit on  limited exam Alert Psychiatric: Normal judgment/insight Normal mood and affect       Scheduled Medications:   amLODipine  10 mg Oral Daily   calcitonin (salmon)  1 spray Alternating Nares Daily   cholecalciferol  1,000 Units Oral Daily   enoxaparin (LOVENOX) injection  40 mg Subcutaneous Q24H   feeding supplement  237 mL Oral TID BM   levothyroxine  50 mcg Oral Q0600   lidocaine  1 patch Transdermal Q24H   loratadine  5 mg Oral QHS   magnesium oxide  400 mg Oral Daily   multivitamin with minerals  1 tablet Oral Daily   pantoprazole  40 mg Oral Daily   polyethylene glycol  17 g Oral Daily   pregabalin  25 mg Oral Q12H   senna-docusate  1 tablet Oral BID   simethicone  160 mg Oral BID    Continuous Infusions:  azithromycin 500 mg (02/20/22 0107)   cefTRIAXone (ROCEPHIN)  IV 2 g (02/19/22 2124)    PRN Medications:  acetaminophen **OR** acetaminophen, calcium carbonate, ipratropium-albuterol, magnesium hydroxide, meclizine, metaxalone, ondansetron **OR** ondansetron (ZOFRAN) IV, oxyCODONE, traZODone  Antimicrobials:  Anti-infectives (From admission, onward)    Start     Dose/Rate Route Frequency Ordered Stop   02/19/22 2200  cefTRIAXone (ROCEPHIN) 2 g in sodium chloride 0.9 % 100 mL IVPB        2 g 200 mL/hr over 30 Minutes Intravenous Every 24 hours 02/18/22 2216 02/24/22 2159   02/18/22 2230  azithromycin (ZITHROMAX) 500 mg in sodium chloride 0.9 % 250 mL IVPB        500 mg 250 mL/hr over 60 Minutes Intravenous Every 24 hours 02/18/22 2216 02/23/22 2229   02/18/22 2030  vancomycin (VANCOCIN) IVPB 1000 mg/200 mL premix        1,000 mg 200 mL/hr over 60 Minutes Intravenous  Once 02/18/22 2017 02/18/22 2345   02/18/22 2030  ceFEPIme (MAXIPIME) 2 g in sodium chloride 0.9 % 100 mL IVPB        2 g 200 mL/hr over 30 Minutes Intravenous  Once 02/18/22 2017 02/18/22 2238       Data Reviewed: I have personally reviewed following labs and imaging studies  CBC: Recent  Labs  Lab 02/18/22 1947 02/19/22 0622 02/20/22 0618  WBC 21.2* 17.2* 15.2*  NEUTROABS 18.0*  --   --   HGB 10.1* 9.9* 8.2*  HCT 33.5* 31.8* 26.3*  MCV 93.3 90.6 91.3  PLT 371 368 295   Basic Metabolic Panel: Recent Labs  Lab 02/18/22 1947 02/19/22 0622 02/20/22 0618  NA 136 134* 135  K 4.6 4.4 4.1  CL 102 100 104  CO2 '25 26 23  '$ GLUCOSE 111* 103* 89  BUN 23 24* 23  CREATININE 1.10* 1.07* 1.03*  CALCIUM 8.3* 8.6* 8.3*   GFR: Estimated Creatinine Clearance: 31.8 mL/min (A) (by C-G formula based on SCr of 1.03 mg/dL (H)). Liver Function Tests: Recent Labs  Lab 02/18/22 1947  AST 18  ALT 15  ALKPHOS 72  BILITOT 0.8  PROT 5.8*  ALBUMIN 2.6*   No results for input(s): "LIPASE", "  AMYLASE" in the last 168 hours. No results for input(s): "AMMONIA" in the last 168 hours. Coagulation Profile: Recent Labs  Lab 02/18/22 2018 02/19/22 0622  INR 1.0 1.1   Cardiac Enzymes: No results for input(s): "CKTOTAL", "CKMB", "CKMBINDEX", "TROPONINI" in the last 168 hours. BNP (last 3 results) No results for input(s): "PROBNP" in the last 8760 hours. HbA1C: No results for input(s): "HGBA1C" in the last 72 hours. CBG: No results for input(s): "GLUCAP" in the last 168 hours. Lipid Profile: No results for input(s): "CHOL", "HDL", "LDLCALC", "TRIG", "CHOLHDL", "LDLDIRECT" in the last 72 hours. Thyroid Function Tests: No results for input(s): "TSH", "T4TOTAL", "FREET4", "T3FREE", "THYROIDAB" in the last 72 hours. Anemia Panel: No results for input(s): "VITAMINB12", "FOLATE", "FERRITIN", "TIBC", "IRON", "RETICCTPCT" in the last 72 hours. Urine analysis:    Component Value Date/Time   COLORURINE YELLOW (A) 02/19/2022 0358   APPEARANCEUR CLEAR (A) 02/19/2022 0358   LABSPEC 1.023 02/19/2022 0358   PHURINE 7.0 02/19/2022 0358   GLUCOSEU NEGATIVE 02/19/2022 0358   HGBUR NEGATIVE 02/19/2022 0358   BILIRUBINUR NEGATIVE 02/19/2022 0358   KETONESUR NEGATIVE 02/19/2022 0358   PROTEINUR  100 (A) 02/19/2022 0358   NITRITE NEGATIVE 02/19/2022 0358   LEUKOCYTESUR NEGATIVE 02/19/2022 0358   Sepsis Labs: '@LABRCNTIP'$ (procalcitonin:4,lacticidven:4)  Recent Results (from the past 240 hour(s))  Resp Panel by RT-PCR (Flu A&B, Covid) Anterior Nasal Swab     Status: Abnormal   Collection Time: 02/18/22  7:48 PM   Specimen: Anterior Nasal Swab  Result Value Ref Range Status   SARS Coronavirus 2 by RT PCR POSITIVE (A) NEGATIVE Final    Comment: (NOTE) SARS-CoV-2 target nucleic acids are DETECTED.  The SARS-CoV-2 RNA is generally detectable in upper respiratory specimens during the acute phase of infection. Positive results are indicative of the presence of the identified virus, but do not rule out bacterial infection or co-infection with other pathogens not detected by the test. Clinical correlation with patient history and other diagnostic information is necessary to determine patient infection status. The expected result is Negative.  Fact Sheet for Patients: EntrepreneurPulse.com.au  Fact Sheet for Healthcare Providers: IncredibleEmployment.be  This test is not yet approved or cleared by the [place] FDA and  has been authorized for detection and/or diagnosis of SARS-CoV-2 by FDA under an Emergency Use Authorization (EUA).  This EUA will remain in effect (meaning this test can be used) for the duration of  the COVID-19 declaration under Section 564(b)(1) of the A ct, 21 U.S.C. section 360bbb-3(b)(1), unless the authorization is terminated or revoked sooner.     Influenza A by PCR NEGATIVE NEGATIVE Final   Influenza B by PCR NEGATIVE NEGATIVE Final    Comment: (NOTE) The Xpert Xpress SARS-CoV-2/FLU/RSV plus assay is intended as an aid in the diagnosis of influenza from Nasopharyngeal swab specimens and should not be used as a sole basis for treatment. Nasal washings and aspirates are unacceptable for Xpert Xpress  SARS-CoV-2/FLU/RSV testing.  Fact Sheet for Patients: EntrepreneurPulse.com.au  Fact Sheet for Healthcare Providers: IncredibleEmployment.be  This test is not yet approved or cleared by the [place] FDA and has been authorized for detection and/or diagnosis of SARS-CoV-2 by FDA under an Emergency Use Authorization (EUA). This EUA will remain in effect (meaning this test can be used) for the duration of the COVID-19 declaration under Section 564(b)(1) of the Act, 21 U.S.C. section 360bbb-3(b)(1), unless the authorization is terminated or revoked.  Performed at [facility], [address] [place], [place] [address]  Blood Culture (routine x 2)     Status: None (Preliminary result)   Collection Time: 02/18/22  8:18 PM   Specimen: BLOOD  Result Value Ref Range Status   Specimen Description BLOOD LEFT ASSIST CONTROL  Final   Special Requests   Final    BOTTLES DRAWN AEROBIC AND ANAEROBIC Blood Culture results may not be optimal due to an inadequate volume of blood received in culture bottles   Culture   Final    NO GROWTH 2 DAYS Performed at [facility], [address] [place], [place] [address]    Report Status PENDING  Incomplete  Blood Culture (routine x 2)     Status: None (Preliminary result)   Collection Time: 02/18/22  8:40 PM   Specimen: BLOOD  Result Value Ref Range Status   Specimen Description BLOOD RIGHT ASSIST CONTROL  Final   Special Requests   Final    BOTTLES DRAWN AEROBIC AND ANAEROBIC Blood Culture adequate volume   Culture   Final    NO GROWTH 2 DAYS Performed at [facility], [address] [place], [place] [address]    Report Status PENDING  Incomplete         Radiology Studies: DG Chest Port 1 View  Result Date: 02/18/2022 CLINICAL DATA:  Questionable sepsis.  Evaluate for abnormality. EXAM: PORTABLE CHEST 1 VIEW COMPARISON:  02/06/2022 FINDINGS: Shallow inspiration.  Increasing atelectasis or infiltration in the right mid lung and left mid/lower lungs. Probable small left pleural effusion is developing. No pneumothorax. Heart size and pulmonary vascularity are normal. Calcification of the aorta. Degenerative changes in the spine with kyphoplasty changes in the lumbar region. IMPRESSION: Shallow inspiration. Increasing infiltration or atelectasis in the right mid lung and left mid/lower lung. Electronically Signed   By: Lucienne Capers M.D.   On: 02/18/2022 20:03            LOS: 2 days       Emeterio Reeve, DO [facility] 02/20/2022, 1:26 PM   Staff may message me via secure chat in [place]  but this may not receive immediate response,  please page for urgent matters!  If 7PM-7AM, please contact night-coverage [email or web address]  Dictation software was used to generate the above note. Typos may occur and escape review, as with typed/written notes. Please contact Dr Sheppard Coil directly for clarity if needed.

## 2022-02-20 NOTE — TOC Initial Note (Signed)
Transition of Care [facility]) - Initial/Assessment Note    Patient Details  Name: Amy Hensley MRN: [id number] Date of Birth: [birth year]  [facility] CM/SW Contact:    Alberteen Sam, LCSW Phone Number: 02/20/2022, 2:15 PM  Clinical Narrative:                  Patient is [place] with [facility] followed by [facility].    Expected Discharge Plan: Skilled Nursing Facility Barriers to Discharge: Continued Medical Work up   Patient Goals and CMS Choice Patient states their goals for this hospitalization and ongoing recovery are:: to go home CMS Medicare.gov Compare Post Acute Care list provided to:: Patient Choice offered to / list presented to : Patient  Expected Discharge Plan and Services Expected Discharge Plan: [place]       Living arrangements for the past 2 months: [place]                                      Prior Living Arrangements/Services Living arrangements for the past 2 months: [place] Lives with:: Facility Resident   Do you feel safe going back to the place where you live?: Yes               Activities of Daily Living Home Assistive Devices/Equipment: Oxygen ADL Screening (condition at time of admission) Patient's cognitive ability adequate to safely complete daily activities?: Yes Is the patient deaf or have difficulty hearing?: No Does the patient have difficulty seeing, even when wearing glasses/contacts?: No Does the patient have difficulty concentrating, remembering, or making decisions?: No Patient able to express need for assistance with ADLs?: Yes Does the patient have difficulty dressing or bathing?: Yes Independently performs ADLs?: No Communication: Independent Dressing (OT): Dependent Is this a change from baseline?: Pre-admission baseline Grooming: Dependent Is this a change from baseline?: Pre-admission baseline Feeding:  Independent Bathing: Dependent Is this a change from baseline?: Pre-admission baseline Toileting: Dependent Is this a change from baseline?: Pre-admission baseline In/Out Bed: Dependent Is this a change from baseline?: Pre-admission baseline Walks in Home: Dependent Is this a change from baseline?: Pre-admission baseline Does the patient have difficulty walking or climbing stairs?: Yes Weakness of Legs: Both Weakness of Arms/Hands: None  Permission Sought/Granted                  Emotional Assessment         Alcohol / Substance Use: Not Applicable Psych Involvement: No (comment)  Admission diagnosis:  Acute on chronic respiratory failure with hypoxia ([place]) [J96.21] Sepsis due to pneumonia ([place]) [J18.9, A41.9] Multifocal pneumonia [J18.9] Patient Active Problem List   Diagnosis Date Noted   Sepsis due to pneumonia ([place]) 02/18/2022   Essential hypertension 02/18/2022   GERD without esophagitis 02/18/2022   Peripheral neuropathy 02/18/2022   PSVT (paroxysmal supraventricular tachycardia) 02/10/2022   Hypokalemia 02/09/2022   COVID-19 virus infection 02/09/2022   Severe sepsis ([place]) secondary to bacterial pneumonia and COVID 02/06/2022   Pressure injury of skin 12/01/2021   Aspiration pneumonia ([place]) 12/01/2021   CAP (community acquired pneumonia) 11/30/2021   Compression fracture of body of thoracic vertebra ([place]) 11/30/2021   Acute respiratory failure with hypoxemia ([place]) 11/02/2021   Acute on chronic respiratory failure with hypoxia ([place]) 11/01/2021   Elevated troponin 11/01/2021   Compression fracture of first lumbar vertebra ([place]) 10/16/2021  Lumbar radiculopathy 09/25/2021   Ambulatory dysfunction 09/25/2021   Acute renal failure superimposed on stage 3a chronic kidney disease ([place]) 09/25/2021   History of fall 08/2021 with sacral insufficiency fractures 09/25/2021   Giant cell arteritis ([place]) 09/25/2021   Hypertension    Hypothyroidism    Hepatitis C     Leukocytosis    Hepatitis C antibody positive in blood 06/01/2021   PCP:  Adin Hector, MD Pharmacy:   [place], [place] [facility] [place] [address] [place] [address] Phone: [phone] Fax: [place] [address] Phone: [phone] Fax: [place], [place] [address] Phone: [phone] Fax: [phone]     Social Determinants of Health (SDOH) Interventions    Readmission Risk Interventions    12/01/2021    9:16 AM  Readmission Risk Prevention Plan  Transportation Screening Complete  PCP or Specialist Appt within 3-5 Days Complete  HRI or [place] Complete  Social Work Consult for [place] Planning/Counseling Complete  Palliative Care Screening Not Applicable  Medication Review Press photographer) Complete

## 2022-02-20 NOTE — Progress Notes (Signed)
       CROSS COVER NOTE  NAME: Amy Hensley MRN: [id number] DOB : 1933/03/15    Date of Service   02/20/2022   HPI/Events of Note   Medication request received for 8/10 chronic back pain refractory to oxycodone  Interventions   Assessment/Plan:  Tramadol Lidocaine patch     This document was prepared using Dragon voice recognition software and may include unintentional dictation errors.  Neomia Glass DNP, MBA, FNP-BC Nurse Practitioner [facility] Pager [phone]

## 2022-02-20 NOTE — Progress Notes (Signed)
PHARMACIST - PHYSICIAN COMMUNICATION  CONCERNING:  Enoxaparin (Lovenox) for DVT Prophylaxis    RECOMMENDATION: Patient was prescribed enoxaprin '30mg'$  q24 hours for VTE prophylaxis.   Filed Weights   02/18/22 2009  Weight: 56.7 kg (125 lb)    Body mass index is 22.14 kg/m.  Estimated Creatinine Clearance: 31.8 mL/min (A) (by C-G formula based on SCr of 1.03 mg/dL (H)).  Patient is candidate for enoxaparin '40mg'$  every 24 hours based on CrCl >40m/min and Weight >45kg  DESCRIPTION: [facility]  Patient is now receiving enoxaparin 40 mg every 24 hours    Kamyra Schroeck Rodriguez-Guzman PharmD, BCPS 02/20/2022 9:17 AM

## 2022-02-21 DIAGNOSIS — J9621 Acute and chronic respiratory failure with hypoxia: Secondary | ICD-10-CM | POA: Diagnosis not present

## 2022-02-21 LAB — CBC
HCT: 26.2 % — ABNORMAL LOW (ref 36.0–46.0)
Hemoglobin: 8.2 g/dL — ABNORMAL LOW (ref 12.0–15.0)
MCH: 28.5 pg (ref 26.0–34.0)
MCHC: 31.3 g/dL (ref 30.0–36.0)
MCV: 91 fL (ref 80.0–100.0)
Platelets: 349 10*3/uL (ref 150–400)
RBC: 2.88 MIL/uL — ABNORMAL LOW (ref 3.87–5.11)
RDW: 16.2 % — ABNORMAL HIGH (ref 11.5–15.5)
WBC: 13.1 10*3/uL — ABNORMAL HIGH (ref 4.0–10.5)
nRBC: 0 % (ref 0.0–0.2)

## 2022-02-21 LAB — BASIC METABOLIC PANEL
Anion gap: 10 (ref 5–15)
BUN: 23 mg/dL (ref 8–23)
CO2: 24 mmol/L (ref 22–32)
Calcium: 8.6 mg/dL — ABNORMAL LOW (ref 8.9–10.3)
Chloride: 102 mmol/L (ref 98–111)
Creatinine, Ser: 1.1 mg/dL — ABNORMAL HIGH (ref 0.44–1.00)
GFR, Estimated: 49 mL/min — ABNORMAL LOW (ref >60)
Glucose, Bld: 90 mg/dL (ref 70–99)
Potassium: 4.2 mmol/L (ref 3.5–5.1)
Sodium: 136 mmol/L (ref 135–145)

## 2022-02-21 LAB — BRAIN NATRIURETIC PEPTIDE: B Natriuretic Peptide: 262.3 pg/mL — ABNORMAL HIGH (ref 0.0–100.0)

## 2022-02-21 MED ORDER — ALBUTEROL SULFATE HFA 108 (90 BASE) MCG/ACT IN AERS: 2.0000 | INHALATION_SPRAY | Freq: Four times a day (QID) | RESPIRATORY_TRACT | Status: DC | PRN

## 2022-02-21 MED ORDER — ENOXAPARIN SODIUM 30 MG/0.3ML IJ SOSY
30.0000 mg | PREFILLED_SYRINGE | INTRAMUSCULAR | Status: DC
  Administered 2022-02-21 – 2022-02-22 (×2): 30 mg via SUBCUTANEOUS
  Filled 2022-02-21 (×2): qty 0.3

## 2022-02-21 MED ORDER — ORAL CARE MOUTH RINSE: 15.0000 mL | OROMUCOSAL | Status: DC | PRN

## 2022-02-21 NOTE — Progress Notes (Signed)
Nutrition Brief Note  Patient identified on the Malnutrition Screening Tool (MST) Report  Wt Readings from Last 15 Encounters:  02/18/22 56.7 kg  02/07/22 56.7 kg  11/30/21 62.6 kg  11/06/21 62.9 kg  10/15/21 60 kg  09/25/21 59.9 kg  06/22/21 59.9 kg  06/01/21 58.1 kg  05/19/21 57.6 kg  12/08/18 53 kg  12/02/18 53.2 kg  10/04/16 55.3 kg   Pt with medical history significant for stage III chronic kidney disease, hypertension and hypothyroidism, who presented  with acute onset of acute respiratory distress with associated productive cough as well as occasional wheezing and diminished pulse oximetry.  Pt admitted with sepsis with pneumonia.   Spoke with pt and husband at bedside. Per pt, her appetite is much improved since last hospital discharge. Pt has been consuming most of her meals and has been eating at [employer] as well. She reports she likes the food at the facility. She denies any issues chewing or swallowing and denies taste changes.   Nutrition-Focused physical exam completed. Findings are no fat depletion, mild muscle depletion, and no edema.  Mild depletions in lower extremities likely related to bedbound status.   Per MD notes, plan to d/c back to SNF with hospice.   Current diet order is Heart Healthy (liberalize to regular), patient is consuming approximately 75-90% of meals at this time. Labs and medications reviewed.   No nutrition interventions warranted at this time. If nutrition issues arise, please consult RD.   Loistine Chance, RD, LDN, [place] Registered Dietitian II Certified Diabetes Care and Education Specialist Please refer to [facility] for RD and/or RD on-call/weekend/after hours pager

## 2022-02-21 NOTE — Plan of Care (Signed)
  Problem: Clinical Measurements: Goal: Signs and symptoms of infection will decrease Outcome: Progressing   Problem: Clinical Measurements: Goal: Respiratory complications will improve Outcome: Progressing   Problem: Clinical Measurements: Goal: Cardiovascular complication will be avoided Outcome: Progressing   Problem: Pain Managment: Goal: General experience of comfort will improve Outcome: Progressing   Problem: Safety: Goal: Ability to remain free from injury will improve Outcome: Progressing

## 2022-02-21 NOTE — Evaluation (Signed)
Physical Therapy Evaluation Patient Details Name: Amy Hensley MRN: [id number] DOB: [birth year] Today's Date: 02/21/2022  History of Present Illness  Amy Hensley is a 88 y.o. Caucasian female with medical history significant for stage III chronic kidney disease, hypertension and hypothyroidism, who presented to the emergency room with acute onset of acute respiratory distress with associated productive cough as well as occasional wheezing and diminished pulse oximetry on her baseline home O2 at 3 L/min to 85% on room air per EMS.  She did not have any reported fever or chills at her SNF.  She was recently treated here for severe sepsis due to bacterial pneumonia and COVID-19 and discharged on 02/12/2022.  No reported nausea or vomiting or abdominal pain.  She was in significant respiratory distress to be placed on CPAP by EMS and upon arrival to the ER on BiPAP.   Clinical Impression  Patient received in bed, husband at bedside. She is agreeable to PT/OT assessment. Patient motivated to improve. She comes from long term care facility. She had been able to get up to a chair with assistance. Patient requires mod +2 assist for bed mobility and was unable to stand with +2 assist at this time due to LE weakness. (2/5 strength grossly in B LEs. Foot drop noted bilaterally.) Patient will continue to benefit from skilled PT while here to improve LE strength and functional independence.       Recommendations for follow up therapy are one component of a multi-disciplinary discharge planning process, led by the attending physician.  Recommendations may be updated based on patient status, additional functional criteria and insurance authorization.  Follow Up Recommendations Home health PT (at Long term care facility) Can patient physically be transported by private vehicle: No    Assistance Recommended at Discharge Frequent or constant Supervision/Assistance  Patient can return home with the following   Two people to help with walking and/or transfers;A lot of help with bathing/dressing/bathroom;Assist for transportation;Direct supervision/assist for medications management    Equipment Recommendations None recommended by PT  Recommendations for Other Services       Functional Status Assessment Patient has had a recent decline in their functional status and demonstrates the ability to make significant improvements in function in a reasonable and predictable amount of time.     Precautions / Restrictions Precautions Precautions: Fall Restrictions Weight Bearing Restrictions: No Other Position/Activity Restrictions: Apparently patient has LSO brace (from prior admission)      Mobility  Bed Mobility Overal bed mobility: Needs Assistance Bed Mobility: Supine to Sit, Sit to Supine, Rolling Rolling: Mod assist, +2 for physical assistance   Supine to sit: Mod assist, +2 for physical assistance, HOB elevated Sit to supine: Mod assist, +2 for physical assistance   General bed mobility comments: assist for trunk and B LE's; able to briefly sit edge of bed unsupported, but fatigues quickly requiring assistance to maintain sitting balance    Transfers Overall transfer level: Needs assistance Equipment used: 2 person hand held assist Transfers: Sit to/from Stand Sit to Stand: Total assist, +2 physical assistance           General transfer comment: attempted to stand, however patient had essentially no push from LEs.    Ambulation/Gait               General Gait Details: not able  Stairs            Wheelchair Mobility    Modified Rankin (Stroke Patients Only)  Balance Overall balance assessment: Needs assistance Sitting-balance support: Feet supported Sitting balance-Leahy Scale: Poor Sitting balance - Comments: initially able to sit unsupported, however fatigued quickly and then needed assist to maintain balance. Postural control: Right lateral lean,  Posterior lean                                   Pertinent Vitals/Pain Pain Assessment Pain Assessment: No/denies pain Pain Intervention(s): Monitored during session, Repositioned    Home Living Family/patient expects to be discharged to:: Skilled nursing facility                   Additional Comments: [facility]    Prior Function Prior Level of Function : Needs assist       Physical Assist : Mobility (physical) Mobility (physical): Bed mobility;Transfers   Mobility Comments: Requires assist for all transfers (performs stand/squat pivot for past couple months); does not tolerate sitting in chair d/t back pain; requires staff assist with transfers ADLs Comments: Assist for ADL's     Hand Dominance        Extremity/Trunk Assessment   Upper Extremity Assessment Upper Extremity Assessment: Defer to OT evaluation    Lower Extremity Assessment Lower Extremity Assessment: Generalized weakness    Cervical / Trunk Assessment Cervical / Trunk Exceptions: forward head/shoulders  Communication   Communication: HOH  Cognition Arousal/Alertness: Awake/alert Behavior During Therapy: WFL for tasks assessed/performed Overall Cognitive Status: Within Functional Limits for tasks assessed                                          General Comments      Exercises     Assessment/Plan    PT Assessment Patient needs continued PT services  PT Problem List Decreased strength;Decreased mobility;Decreased balance;Decreased safety awareness;Decreased activity tolerance;Cardiopulmonary status limiting activity       PT Treatment Interventions DME instruction;Functional mobility training;Therapeutic activities;Therapeutic exercise;Balance training;Patient/family education    PT Goals (Current goals can be found in the Care Plan section)  Acute Rehab PT Goals Patient Stated Goal: to get up to the chair PT Goal Formulation: With  patient/family Time For Goal Achievement: 03/07/22 Potential to Achieve Goals: Fair    Frequency Min 2X/week     Co-evaluation PT/OT/SLP Co-Evaluation/Treatment: Yes Reason for Co-Treatment: For patient/therapist safety;To address functional/ADL transfers PT goals addressed during session: Mobility/safety with mobility;Balance         AM-PAC PT "6 Clicks" Mobility  Outcome Measure Help needed turning from your back to your side while in a flat bed without using bedrails?: A Lot Help needed moving from lying on your back to sitting on the side of a flat bed without using bedrails?: A Lot Help needed moving to and from a bed to a chair (including a wheelchair)?: Total Help needed standing up from a chair using your arms (e.g., wheelchair or bedside chair)?: Total Help needed to walk in hospital room?: Total Help needed climbing 3-5 steps with a railing? : Total 6 Click Score: 8    End of Session Equipment Utilized During Treatment: Oxygen Activity Tolerance: Patient limited by fatigue Patient left: in bed;with call bell/phone within reach;with family/visitor present Nurse Communication: Mobility status PT Visit Diagnosis: Muscle weakness (generalized) (M62.81);Other abnormalities of gait and mobility (R26.89);Pain    Time: 6761-9509 PT Time Calculation (min) (ACUTE  ONLY): 17 min   Charges:   PT Evaluation $PT Eval Moderate Complexity: 1 Mod          Lorraina Spring, PT, GCS 02/21/22,12:11 PM

## 2022-02-21 NOTE — Evaluation (Signed)
Occupational Therapy Evaluation Patient Details Name: Amy Hensley MRN: [id number] DOB: [birth year] Today's Date: 02/21/2022   History of Present Illness Amy Hensley is a 88 y.o. Caucasian female with medical history significant for stage III chronic kidney disease, hypertension and hypothyroidism, who presented to the emergency room with acute onset of acute respiratory distress with associated productive cough as well as occasional wheezing and diminished pulse oximetry on her baseline home O2 at 3 L/min to 85% on room air per EMS.  She did not have any reported fever or chills at her SNF.  She was recently treated here for severe sepsis due to bacterial pneumonia and COVID-19 and discharged on 02/12/2022.  No reported nausea or vomiting or abdominal pain.  She was in significant respiratory distress to be placed on CPAP by EMS and upon arrival to the ER on BiPAP.   Clinical Impression   Pt and spouse agreeable to OT/PT co-treatment to maximize safety and participation. Patient presenting with decreased independence in self care, balance, functional mobility/transfers, and endurance. Pt is from long term care and required assistance there for all ADLs and transfers. Pt motivated to work with therapy and improve function. Patient currently functioning at Mod A +2 for bed mobility and Max A for LB dressing. Pt required assistance to maintain static sitting balance and was unable to stand at EOB despite +2 assist. Patient will benefit from acute OT to increase overall independence in the areas of ADLs and functional mobility in order to safely discharge to next venue of care. Upon hospital discharge, recommend STR to maximize pt safety and return to PLOF.       Recommendations for follow up therapy are one component of a multi-disciplinary discharge planning process, led by the attending physician.  Recommendations may be updated based on patient status, additional functional criteria and insurance  authorization.   Follow Up Recommendations  Other (comment) (OT at LTC)    Assistance Recommended at Discharge Frequent or constant Supervision/Assistance  Patient can return home with the following Two people to help with walking and/or transfers;A lot of help with bathing/dressing/bathroom;Assist for transportation;Direct supervision/assist for medications management    Functional Status Assessment  Patient has had a recent decline in their functional status and demonstrates the ability to make significant improvements in function in a reasonable and predictable amount of time.  Equipment Recommendations  Other (comment) (defer to next venue of care)    Recommendations for Other Services       Precautions / Restrictions Precautions Precautions: Fall Restrictions Weight Bearing Restrictions: No Other Position/Activity Restrictions: Apparently patient has LSO brace (from prior admission)      Mobility Bed Mobility Overal bed mobility: Needs Assistance Bed Mobility: Supine to Sit, Sit to Supine, Rolling Rolling: Mod assist, +2 for physical assistance   Supine to sit: Mod assist, +2 for physical assistance, HOB elevated Sit to supine: Mod assist, +2 for physical assistance   General bed mobility comments: assist for trunk and B LE's; able to briefly sit edge of bed unsupported, but fatigues quickly requiring assistance to maintain sitting balance    Transfers Overall transfer level: Needs assistance Equipment used: 2 person hand held assist Transfers: Sit to/from Stand Sit to Stand: Total assist, +2 physical assistance           General transfer comment: attempted to stand, however patient had essentially no push from LEs.      Balance Overall balance assessment: Needs assistance Sitting-balance support: Feet supported Sitting balance-Leahy Scale:  Poor Sitting balance - Comments: initially able to sit unsupported, however fatigued quickly and then needed assist to  maintain balance. Postural control: Right lateral lean, Posterior lean                                 ADL either performed or assessed with clinical judgement   ADL Overall ADL's : Needs assistance/impaired                                       General ADL Comments: Max A LB dressing bed level. Pt requires BUE support for static sitting balance at EOB, anticipate Min A for seated grooming tasks     Vision Patient Visual Report: No change from baseline       Perception     Praxis      Pertinent Vitals/Pain Pain Assessment Pain Assessment: No/denies pain     Hand Dominance     Extremity/Trunk Assessment Upper Extremity Assessment Upper Extremity Assessment: Generalized weakness   Lower Extremity Assessment Lower Extremity Assessment: Generalized weakness (bilateral foot drop)   Cervical / Trunk Assessment Cervical / Trunk Exceptions: forward head/shoulders   Communication Communication Communication: HOH   Cognition Arousal/Alertness: Awake/alert Behavior During Therapy: WFL for tasks assessed/performed Overall Cognitive Status: Within Functional Limits for tasks assessed                                       General Comments       Exercises Other Exercises Other Exercises: OT provided education re: role of OT, OT POC, post acute recs, sitting up for all meals, EOB/OOB mobility with assistance, home/fall safety.     Shoulder Instructions      Home Living Family/patient expects to be discharged to:: Skilled nursing facility                                 Additional Comments: [facility]      Prior Functioning/Environment Prior Level of Function : Needs assist       Physical Assist : Mobility (physical);ADLs (physical) Mobility (physical): Bed mobility;Transfers ADLs (physical): Feeding;Grooming;Bathing;Dressing;Toileting;IADLs Mobility Comments: Requires assist for all transfers (performs  stand/squat pivot for past couple months); does not tolerate sitting in chair d/t back pain; requires staff assist with transfers ADLs Comments: Assist for ADLs        OT Problem List: Decreased strength;Decreased range of motion;Impaired balance (sitting and/or standing);Decreased activity tolerance;Pain      OT Treatment/Interventions: Self-care/ADL training;Therapeutic exercise;Energy conservation;DME and/or AE instruction;Therapeutic activities;Patient/family education;Balance training    OT Goals(Current goals can be found in the care plan section) Acute Rehab OT Goals Patient Stated Goal: to sit in recliner OT Goal Formulation: With patient/family Time For Goal Achievement: 03/07/22 Potential to Achieve Goals: Fair ADL Goals Pt Will Perform Grooming: with set-up;with supervision;sitting Pt Will Perform Lower Body Dressing: with mod assist;sitting/lateral leans Pt Will Transfer to Toilet: with mod assist;with +2 assist;squat pivot transfer;bedside commode Pt/caregiver will Perform Home Exercise Program: Increased strength;Increased ROM;Both right and left upper extremity  OT Frequency: Min 2X/week    Co-evaluation PT/OT/SLP Co-Evaluation/Treatment: Yes Reason for Co-Treatment: To address functional/ADL transfers;For patient/therapist safety PT goals addressed during session: Mobility/safety with  mobility;Balance OT goals addressed during session: ADL's and self-care      AM-PAC OT "6 Clicks" Daily Activity     Outcome Measure Help from another person eating meals?: A Little Help from another person taking care of personal grooming?: A Little Help from another person toileting, which includes using toliet, bedpan, or urinal?: A Lot Help from another person bathing (including washing, rinsing, drying)?: A Lot Help from another person to put on and taking off regular upper body clothing?: A Little Help from another person to put on and taking off regular lower body clothing?: A  Lot 6 Click Score: 15   End of Session Nurse Communication: Mobility status  Activity Tolerance: Patient limited by fatigue Patient left: in bed;with call bell/phone within reach;with bed alarm set;with family/visitor present  OT Visit Diagnosis: Other abnormalities of gait and mobility (R26.89);Muscle weakness (generalized) (M62.81)                Time: 1100-1116 OT Time Calculation (min): 16 min Charges:  OT General Charges $OT Visit: 1 Visit OT Evaluation $OT Eval Moderate Complexity: 1 Mod  [facility] MS, OTR/L ascom [phone]  02/21/22, 1:22 PM

## 2022-02-21 NOTE — Progress Notes (Signed)
PROGRESS NOTE    Amy Hensley  YSA:[id number] DOB: [birth year] DOA: 02/18/2022 PCP: Adin Hector, MD    Brief Narrative:  Amy Hensley is a 88 y.o. Caucasian female with medical history significant for stage III chronic kidney disease, hypertension and hypothyroidism, who presented to the emergency room with acute onset of acute respiratory distress with associated productive cough as well as occasional wheezing and diminished pulse oximetry on her baseline home O2 at 3 L/min to 85% on room air per EMS.  She did not have any reported fever or chills at her SNF.  She was recently treated here for severe sepsis due to bacterial pneumonia and COVID-19 and discharged on 02/12/2022.  No reported nausea or vomiting or abdominal pain.   10/08: She was in significant respiratory distress to be placed on CPAP by EMS and upon arrival to the ER on BiPAP.  Chest x-ray showed shallow inspiration with increasing infiltration or atelectasis in the right midlung in the left mid/lower lung. IV cefepime and vancomycin started for multifocal pneumonia w/ sepsis (tachycardia/tachypnea, WBC 21.2). Pt admitted.  10/09: tachycardia/tachypnea resolved, SpO2 98% on 6L [place]. WBC improved to 17.2.  10/10: WBC still improving to 15. BP soft. BCx NG <12h as of this morning.  Still requiring 5L/min O2 via [place].   10/11 per PT when pt sat in bed 02 dropped to 89% on 5L. Had discussion with husband about care and goals of care. He was interested in hospice, hospice consult>>>[facility]  Consultants:    Procedures:   Antimicrobials:  Rocephin and azithromycin    Subjective: Becomes sob with conversation. Tells me feels little better. No cp or any new complaints.  Objective: Vitals:   02/21/22 0221 02/21/22 0431 02/21/22 0800 02/21/22 1156  BP:  130/63 (!) 133/55   Pulse:  93 91   Resp:  20    Temp: 98.1 F (36.7 C) 98 F (36.7 C) 98 F (36.7 C)   TempSrc: Oral Oral    SpO2:  95% 93%  (!) 89%  Weight:      Height:        Intake/Output Summary (Last 24 hours) at 02/21/2022 1432 Last data filed at 02/21/2022 0220 Gross per 24 hour  Intake 448.73 ml  Output 385 ml  Net 63.73 ml   Filed Weights   02/18/22 2009  Weight: 56.7 kg    Examination: Calm, NAD Mild rhonchi, no wheezing Reg s1/s2 no gallop Soft benign +bs No edema Awake and alert.  Mood and affect appropriate in current setting    Data Reviewed: I have personally reviewed following labs and imaging studies  CBC: Recent Labs  Lab 02/18/22 1947 02/19/22 0622 02/20/22 0618 02/21/22 0532  WBC 21.2* 17.2* 15.2* 13.1*  NEUTROABS 18.0*  --   --   --   HGB 10.1* 9.9* 8.2* 8.2*  HCT 33.5* 31.8* 26.3* 26.2*  MCV 93.3 90.6 91.3 91.0  PLT 371 368 330 323   Basic Metabolic Panel: Recent Labs  Lab 02/18/22 1947 02/19/22 0622 02/20/22 0618 02/21/22 0532  NA 136 134* 135 136  K 4.6 4.4 4.1 4.2  CL 102 100 104 102  CO2 '25 26 23 24  '$ GLUCOSE 111* 103* 89 90  BUN 23 24* 23 23  CREATININE 1.10* 1.07* 1.03* 1.10*  CALCIUM 8.3* 8.6* 8.3* 8.6*   GFR: Estimated Creatinine Clearance: 29.8 mL/min (A) (by C-G formula based on SCr of 1.1 mg/dL (H)). Liver Function  Tests: Recent Labs  Lab 02/18/22 1947  AST 18  ALT 15  ALKPHOS 72  BILITOT 0.8  PROT 5.8*  ALBUMIN 2.6*   No results for input(s): "LIPASE", "AMYLASE" in the last 168 hours. No results for input(s): "AMMONIA" in the last 168 hours. Coagulation Profile: Recent Labs  Lab 02/18/22 2018 02/19/22 0622  INR 1.0 1.1   Cardiac Enzymes: No results for input(s): "CKTOTAL", "CKMB", "CKMBINDEX", "TROPONINI" in the last 168 hours. BNP (last 3 results) No results for input(s): "PROBNP" in the last 8760 hours. HbA1C: No results for input(s): "HGBA1C" in the last 72 hours. CBG: No results for input(s): "GLUCAP" in the last 168 hours. Lipid Profile: No results for input(s): "CHOL", "HDL", "LDLCALC", "TRIG", "CHOLHDL", "LDLDIRECT" in the  last 72 hours. Thyroid Function Tests: No results for input(s): "TSH", "T4TOTAL", "FREET4", "T3FREE", "THYROIDAB" in the last 72 hours. Anemia Panel: No results for input(s): "VITAMINB12", "FOLATE", "FERRITIN", "TIBC", "IRON", "RETICCTPCT" in the last 72 hours. Sepsis Labs: Recent Labs  Lab 02/18/22 1947 02/19/22 0622  PROCALCITON 0.16 0.29  LATICACIDVEN 1.2  --     Recent Results (from the past 240 hour(s))  Resp Panel by RT-PCR (Flu A&B, Covid) Anterior Nasal Swab     Status: Abnormal   Collection Time: 02/18/22  7:48 PM   Specimen: Anterior Nasal Swab  Result Value Ref Range Status   SARS Coronavirus 2 by RT PCR POSITIVE (A) NEGATIVE Final    Comment: (NOTE) SARS-CoV-2 target nucleic acids are DETECTED.  The SARS-CoV-2 RNA is generally detectable in upper respiratory specimens during the acute phase of infection. Positive results are indicative of the presence of the identified virus, but do not rule out bacterial infection or co-infection with other pathogens not detected by the test. Clinical correlation with patient history and other diagnostic information is necessary to determine patient infection status. The expected result is Negative.  Fact Sheet for Patients: EntrepreneurPulse.com.au  Fact Sheet for Healthcare Providers: IncredibleEmployment.be  This test is not yet approved or cleared by the [place] FDA and  has been authorized for detection and/or diagnosis of SARS-CoV-2 by FDA under an Emergency Use Authorization (EUA).  This EUA will remain in effect (meaning this test can be used) for the duration of  the COVID-19 declaration under Section 564(b)(1) of the A ct, 21 U.S.C. section 360bbb-3(b)(1), unless the authorization is terminated or revoked sooner.     Influenza A by PCR NEGATIVE NEGATIVE Final   Influenza B by PCR NEGATIVE NEGATIVE Final    Comment: (NOTE) The Xpert Xpress SARS-CoV-2/FLU/RSV plus assay is  intended as an aid in the diagnosis of influenza from Nasopharyngeal swab specimens and should not be used as a sole basis for treatment. Nasal washings and aspirates are unacceptable for Xpert Xpress SARS-CoV-2/FLU/RSV testing.  Fact Sheet for Patients: EntrepreneurPulse.com.au  Fact Sheet for Healthcare Providers: IncredibleEmployment.be  This test is not yet approved or cleared by the [place] FDA and has been authorized for detection and/or diagnosis of SARS-CoV-2 by FDA under an Emergency Use Authorization (EUA). This EUA will remain in effect (meaning this test can be used) for the duration of the COVID-19 declaration under Section 564(b)(1) of the Act, 21 U.S.C. section 360bbb-3(b)(1), unless the authorization is terminated or revoked.  Performed at [facility], [address] [place], [place] [address]   Blood Culture (routine x 2)     Status: None (Preliminary result)   Collection Time: 02/18/22  8:18 PM   Specimen: BLOOD  Result  Value Ref Range Status   Specimen Description BLOOD LEFT ASSIST CONTROL  Final   Special Requests   Final    BOTTLES DRAWN AEROBIC AND ANAEROBIC Blood Culture results may not be optimal due to an inadequate volume of blood received in culture bottles   Culture   Final    NO GROWTH 3 DAYS Performed at [facility], [address], [place], [place] [address]    Report Status PENDING  Incomplete  Blood Culture (routine x 2)     Status: None (Preliminary result)   Collection Time: 02/18/22  8:40 PM   Specimen: BLOOD  Result Value Ref Range Status   Specimen Description BLOOD RIGHT ASSIST CONTROL  Final   Special Requests   Final    BOTTLES DRAWN AEROBIC AND ANAEROBIC Blood Culture adequate volume   Culture   Final    NO GROWTH 3 DAYS Performed at [facility], [address], [place],  [address]    Report Status PENDING  Incomplete         Radiology  Studies: No results found.      Scheduled Meds:  amLODipine  10 mg Oral Daily   calcitonin (salmon)  1 spray Alternating Nares Daily   cholecalciferol  1,000 Units Oral Daily   enoxaparin (LOVENOX) injection  30 mg Subcutaneous Q24H   feeding supplement  237 mL Oral TID BM   levothyroxine  50 mcg Oral Q0600   lidocaine  1 patch Transdermal Q24H   loratadine  5 mg Oral QHS   magnesium oxide  400 mg Oral Daily   multivitamin with minerals  1 tablet Oral Daily   pantoprazole  40 mg Oral Daily   polyethylene glycol  17 g Oral Daily   pregabalin  25 mg Oral Q12H   senna-docusate  1 tablet Oral BID   simethicone  160 mg Oral BID   Continuous Infusions:  azithromycin Stopped (02/21/22 0010)   cefTRIAXone (ROCEPHIN)  IV Stopped (02/20/22 2238)    Assessment & Plan:   Principal Problem:   Acute on chronic respiratory failure with hypoxia (HCC) Active Problems:   Sepsis due to pneumonia [facility])   Hypothyroidism   Essential hypertension   GERD without esophagitis   Peripheral neuropathy   Sepsis due to pneumonia ([place]) Recently COVID(+) initially positive test 02/06/22 (approx 2 weeks PTA) - likely cleared  10/11 continue iv abx Ck bnp Hospice consulted, plan for [facility] and no readmission if she compensates   Acute on chronic (baseline 3 L/min) respiratory failure with hypoxia ([place]) Secondary to her multifocal pneumonia. 10/11 ib 5L at rest with movement 02 sat drops to 89%.  Will continue to tx as above. Wean 02 as tolerated   Essential hypertension Continue current regimen   Hypothyroidism Continue synthroid   GERD without esophagitis continue PPI    Peripheral neuropathy continue Lyrica.     DVT prophylaxis: Lovenox Code Status:.  DNR Family Communication: Husband at bedside [facility], when medically more stable. Status is: Inpatient Remains inpatient appropriate because: iv treatment        LOS: 3 days   Time  spent: 35 min    Nolberto Hanlon, MD [facility] Pager [phone]  If 7PM-7AM, please contact night-coverage 02/21/2022, 2:32 PM

## 2022-02-21 NOTE — Progress Notes (Signed)
Manufacturing engineer [facility]) Hospital Liaison Note   Received request from Transitions of Care Manager, Caryl Pina, for hospice services at after discharge. MSW provided extensive education of dispo options, family chose to pursue services @ [facility]. Chart and patient information under review by [facility] physician. Hospice eligibility approved.   Spoke with patient & husband/Jack to initiate education related to hospice philosophy, services, and team approach to care. Both verbalized understanding of information given. Per discussion, the plan is for patient to discharge via [facility] once cleared to DC.   Please send signed and completed DNR home with patient/family. Please provide prescriptions at discharge as needed to ensure ongoing symptom management.    [facility] information and contact numbers given to family & above information shared with TOC.   Please call with any questions/concerns.    Thank you for the opportunity to participate in this patient's care.   Daphene Calamity, MSW [facility] Liaison  [phone]

## 2022-02-22 DIAGNOSIS — J9621 Acute and chronic respiratory failure with hypoxia: Secondary | ICD-10-CM | POA: Diagnosis not present

## 2022-02-22 MED ORDER — FUROSEMIDE 20 MG PO TABS
10.0000 mg | ORAL_TABLET | Freq: Once | ORAL | Status: AC
  Administered 2022-02-22: 10 mg via ORAL
  Filled 2022-02-22: qty 1

## 2022-02-22 NOTE — Progress Notes (Signed)
Manufacturing engineer [facility]) Hospital Liaison Note   Spoke with husband/Jack to confirm POC is to continue treatment during hospitalization vs shifting to comfort. Barnabas Lister reports that he wants to continue to treat patient to optimize medically prior to DC. MSW voiced understanding.   Plan remains for [facility] to provide hospice services upon DC. MD/Dr. Kurtis Bushman notified of above.   Please send signed and completed DNR home with patient/family. Please provide prescriptions at discharge as needed to ensure ongoing symptom management.    [facility] information and contact numbers given to family & above information shared with TOC.   Please call with any questions/concerns.    Thank you for the opportunity to participate in this patient's care.   Daphene Calamity, MSW [facility] Liaison  [phone]

## 2022-02-22 NOTE — Consult Note (Signed)
   [facility] CM Inpatient Consult   02/22/2022  EULA JASTER [birth year] [id number]  [facility] [ACO] Patient: [facility] Liaison remote coverage for [facility] for review and post hospital/community   Primary Care Provider:  Adin Hector, MD  Patient screened for less than 7 days readmission hospitalization with noted extreme high risk score for unplanned readmission risk. Review of patient's medical record reveals patient is from [facility]. With 6 admissions in the past 6 months. Admitted with COVID-19 pneumonia.   Plan:  Currently, patient is to return to LTC SNF for post hospital and needs are to be met at the SNF level of care.  There is no [facility] Care Coordination noted for LTC SNF at this time.  For questions contact:   Natividad Brood, RN BSN [place]  [phone] business mobile phone Toll free office [phone]  *[place]  [phone] Fax number: [phone] [email or web address]

## 2022-02-22 NOTE — Care Management Important Message (Signed)
Important Message  Patient Details  Name: Amy Hensley MRN: [id number] Date of Birth: Sep 21, 1932   Medicare Important Message Given:  Other (see comment)  Disposition to discharge with hospice services currently.  Medicare IM withheld at this time out of respect for patient and family.     Dannette Barbara 02/22/2022, 12:01 PM

## 2022-02-22 NOTE — Progress Notes (Signed)
PROGRESS NOTE    Amy Hensley  PRF:[id number] DOB: 11-28-1932 DOA: 02/18/2022 PCP: Adin Hector, MD    Brief Narrative:  Amy Hensley is a 88 y.o. Caucasian female with medical history significant for stage III chronic kidney disease, hypertension and hypothyroidism, who presented to the emergency room with acute onset of acute respiratory distress with associated productive cough as well as occasional wheezing and diminished pulse oximetry on her baseline home O2 at 3 L/min to 85% on room air per EMS.  She did not have any reported fever or chills at her SNF.  She was recently treated here for severe sepsis due to bacterial pneumonia and COVID-19 and discharged on 02/12/2022.  No reported nausea or vomiting or abdominal pain.   10/08: She was in significant respiratory distress to be placed on CPAP by EMS and upon arrival to the ER on BiPAP.  Chest x-ray showed shallow inspiration with increasing infiltration or atelectasis in the right midlung in the left mid/lower lung. IV cefepime and vancomycin started for multifocal pneumonia w/ sepsis (tachycardia/tachypnea, WBC 21.2). Pt admitted.  10/09: tachycardia/tachypnea resolved, SpO2 98% on 6L [place]. WBC improved to 17.2.  10/10: WBC still improving to 15. BP soft. BCx NG <12h as of this morning.  Still requiring 5L/min O2 via [place].   10/11 per PT when pt sat in bed 02 dropped to 89% on 5L. Had discussion with husband about care and goals of care. He was interested in hospice, hospice consult>>>[facility]. 10/12 husband would like to wean her down on 02 bit more before sending her to snf.  On 5 L  Consultants:    Procedures:   Antimicrobials:  Rocephin and azithromycin    Subjective: Shortness of breath is improving.  No other complaints   Objective: Vitals:   02/21/22 1500 02/22/22 0100 02/22/22 0510 02/22/22 0800  BP: 122/62 131/69 110/66 136/67  Pulse: 95 (!) 106 90 91  Resp: '16 16 18 18  '$ Temp: 97.7 F  (36.5 C) 98.5 F (36.9 C) 98.4 F (36.9 C) 98.2 F (36.8 C)  TempSrc:  Oral Oral Oral  SpO2: 91% 93% 95% 95%  Weight:      Height:        Intake/Output Summary (Last 24 hours) at 02/22/2022 0835 Last data filed at 02/22/2022 0526 Gross per 24 hour  Intake 480 ml  Output 1575 ml  Net -1095 ml   Filed Weights   02/18/22 2009  Weight: 56.7 kg    Examination: Calm, NAD Decrease bs , no wheezing Reg s1/s2 no gallop Soft benign +bs Trace edema b/l Awake and alert Mood and affect appropriate in current setting    Data Reviewed: I have personally reviewed following labs and imaging studies  CBC: Recent Labs  Lab 02/18/22 1947 02/19/22 0622 02/20/22 0618 02/21/22 0532  WBC 21.2* 17.2* 15.2* 13.1*  NEUTROABS 18.0*  --   --   --   HGB 10.1* 9.9* 8.2* 8.2*  HCT 33.5* 31.8* 26.3* 26.2*  MCV 93.3 90.6 91.3 91.0  PLT 371 368 330 935   Basic Metabolic Panel: Recent Labs  Lab 02/18/22 1947 02/19/22 0622 02/20/22 0618 02/21/22 0532  NA 136 134* 135 136  K 4.6 4.4 4.1 4.2  CL 102 100 104 102  CO2 '25 26 23 24  '$ GLUCOSE 111* 103* 89 90  BUN 23 24* 23 23  CREATININE 1.10* 1.07* 1.03* 1.10*  CALCIUM 8.3* 8.6* 8.3* 8.6*  GFR: Estimated Creatinine Clearance: 29.8 mL/min (A) (by C-G formula based on SCr of 1.1 mg/dL (H)). Liver Function Tests: Recent Labs  Lab 02/18/22 1947  AST 18  ALT 15  ALKPHOS 72  BILITOT 0.8  PROT 5.8*  ALBUMIN 2.6*   No results for input(s): "LIPASE", "AMYLASE" in the last 168 hours. No results for input(s): "AMMONIA" in the last 168 hours. Coagulation Profile: Recent Labs  Lab 02/18/22 2018 02/19/22 0622  INR 1.0 1.1   Cardiac Enzymes: No results for input(s): "CKTOTAL", "CKMB", "CKMBINDEX", "TROPONINI" in the last 168 hours. BNP (last 3 results) No results for input(s): "PROBNP" in the last 8760 hours. HbA1C: No results for input(s): "HGBA1C" in the last 72 hours. CBG: No results for input(s): "GLUCAP" in the last 168  hours. Lipid Profile: No results for input(s): "CHOL", "HDL", "LDLCALC", "TRIG", "CHOLHDL", "LDLDIRECT" in the last 72 hours. Thyroid Function Tests: No results for input(s): "TSH", "T4TOTAL", "FREET4", "T3FREE", "THYROIDAB" in the last 72 hours. Anemia Panel: No results for input(s): "VITAMINB12", "FOLATE", "FERRITIN", "TIBC", "IRON", "RETICCTPCT" in the last 72 hours. Sepsis Labs: Recent Labs  Lab 02/18/22 1947 02/19/22 0622  PROCALCITON 0.16 0.29  LATICACIDVEN 1.2  --     Recent Results (from the past 240 hour(s))  Resp Panel by RT-PCR (Flu A&B, Covid) Anterior Nasal Swab     Status: Abnormal   Collection Time: 02/18/22  7:48 PM   Specimen: Anterior Nasal Swab  Result Value Ref Range Status   SARS Coronavirus 2 by RT PCR POSITIVE (A) NEGATIVE Final    Comment: (NOTE) SARS-CoV-2 target nucleic acids are DETECTED.  The SARS-CoV-2 RNA is generally detectable in upper respiratory specimens during the acute phase of infection. Positive results are indicative of the presence of the identified virus, but do not rule out bacterial infection or co-infection with other pathogens not detected by the test. Clinical correlation with patient history and other diagnostic information is necessary to determine patient infection status. The expected result is Negative.  Fact Sheet for Patients: EntrepreneurPulse.com.au  Fact Sheet for Healthcare Providers: IncredibleEmployment.be  This test is not yet approved or cleared by the [place] FDA and  has been authorized for detection and/or diagnosis of SARS-CoV-2 by FDA under an Emergency Use Authorization (EUA).  This EUA will remain in effect (meaning this test can be used) for the duration of  the COVID-19 declaration under Section 564(b)(1) of the A ct, 21 U.S.C. section 360bbb-3(b)(1), unless the authorization is terminated or revoked sooner.     Influenza A by PCR NEGATIVE NEGATIVE Final    Influenza B by PCR NEGATIVE NEGATIVE Final    Comment: (NOTE) The Xpert Xpress SARS-CoV-2/FLU/RSV plus assay is intended as an aid in the diagnosis of influenza from Nasopharyngeal swab specimens and should not be used as a sole basis for treatment. Nasal washings and aspirates are unacceptable for Xpert Xpress SARS-CoV-2/FLU/RSV testing.  Fact Sheet for Patients: EntrepreneurPulse.com.au  Fact Sheet for Healthcare Providers: IncredibleEmployment.be  This test is not yet approved or cleared by the [place] FDA and has been authorized for detection and/or diagnosis of SARS-CoV-2 by FDA under an Emergency Use Authorization (EUA). This EUA will remain in effect (meaning this test can be used) for the duration of the COVID-19 declaration under Section 564(b)(1) of the Act, 21 U.S.C. section 360bbb-3(b)(1), unless the authorization is terminated or revoked.  Performed at [facility], [address], [place], [place] [address]   Blood Culture (routine x 2)  Status: None (Preliminary result)   Collection Time: 02/18/22  8:18 PM   Specimen: BLOOD  Result Value Ref Range Status   Specimen Description BLOOD LEFT ASSIST CONTROL  Final   Special Requests   Final    BOTTLES DRAWN AEROBIC AND ANAEROBIC Blood Culture results may not be optimal due to an inadequate volume of blood received in culture bottles   Culture   Final    NO GROWTH 3 DAYS Performed at [facility], [address], [place],  [address]    Report Status PENDING  Incomplete  Blood Culture (routine x 2)     Status: None (Preliminary result)   Collection Time: 02/18/22  8:40 PM   Specimen: BLOOD  Result Value Ref Range Status   Specimen Description BLOOD RIGHT ASSIST CONTROL  Final   Special Requests   Final    BOTTLES DRAWN AEROBIC AND ANAEROBIC Blood Culture adequate volume   Culture   Final    NO GROWTH 3 DAYS Performed at [facility],  [address] [place],  [address]    Report Status PENDING  Incomplete         Radiology Studies: No results found.      Scheduled Meds:  amLODipine  10 mg Oral Daily   calcitonin (salmon)  1 spray Alternating Nares Daily   cholecalciferol  1,000 Units Oral Daily   enoxaparin (LOVENOX) injection  30 mg Subcutaneous Q24H   feeding supplement  237 mL Oral TID BM   furosemide  10 mg Oral Once   levothyroxine  50 mcg Oral Q0600   lidocaine  1 patch Transdermal Q24H   loratadine  5 mg Oral QHS   magnesium oxide  400 mg Oral Daily   multivitamin with minerals  1 tablet Oral Daily   pantoprazole  40 mg Oral Daily   polyethylene glycol  17 g Oral Daily   pregabalin  25 mg Oral Q12H   senna-docusate  1 tablet Oral BID   simethicone  160 mg Oral BID   Continuous Infusions:  azithromycin 500 mg (02/21/22 2304)   cefTRIAXone (ROCEPHIN)  IV 2 g (02/21/22 2217)    Assessment & Plan:   Principal Problem:   Acute on chronic respiratory failure with hypoxia (HCC) Active Problems:   Sepsis due to pneumonia [facility])   Hypothyroidism   Essential hypertension   GERD without esophagitis   Peripheral neuropathy   Sepsis due to pneumonia ([place]) Recently COVID(+) initially positive test 02/06/22 (approx 2 weeks PTA) - likely cleared  10/11 continue iv abx Ck bnp Hospice consulted, plan for [facility] and no readmission if she compensates 10/12 continue antibiotics    Acute on chronic (baseline 3 L/min) respiratory failure with hypoxia ([place]) Secondary to her multifocal pneumonia. 10/11 ib 5L at rest with movement 02 sat drops to 89%.  Will continue to tx as above. Wean 02 as tolerated 10/12 BNP mildly elevated.  Will give Lasix '20mg'$  po total x1   Essential hypertension Stable Continue current regimen   Hypothyroidism Continue Synthroid   GERD without esophagitis continue PPI    Peripheral neuropathy continue Lyrica.     DVT prophylaxis:  Lovenox Code Status:.  DNR Family Communication: Husband at bedside [facility] Plan[facility], when medically more stable. Status is: Inpatient Remains inpatient appropriate because: iv treatment        LOS: 4 days   Time spent: 35 min    Amy Hanlon, MD [facility] Pager [phone]  [phone]  If 7PM-7AM, please contact night-coverage 02/22/2022, 8:35 AM

## 2022-02-23 DIAGNOSIS — J9621 Acute and chronic respiratory failure with hypoxia: Secondary | ICD-10-CM | POA: Diagnosis not present

## 2022-02-23 LAB — CULTURE, BLOOD (ROUTINE X 2)
Culture: NO GROWTH
Culture: NO GROWTH
Special Requests: ADEQUATE

## 2022-02-23 LAB — CREATININE, SERUM
Creatinine, Ser: 1.02 mg/dL — ABNORMAL HIGH (ref 0.44–1.00)
GFR, Estimated: 53 mL/min — ABNORMAL LOW (ref >60)

## 2022-02-23 LAB — POTASSIUM: Potassium: 4.4 mmol/L (ref 3.5–5.1)

## 2022-02-23 MED ORDER — LIDOCAINE 5 % EX PTCH: 1.0000 | MEDICATED_PATCH | CUTANEOUS | 0 refills | Status: AC

## 2022-02-23 MED ORDER — FLEET ENEMA 7-19 GM/118ML RE ENEM
1.0000 | ENEMA | Freq: Once | RECTAL | Status: AC
  Administered 2022-02-23: 1 via RECTAL

## 2022-02-23 MED ORDER — OXYCODONE HCL 5 MG PO TABS: 5.0000 mg | ORAL_TABLET | Freq: Four times a day (QID) | ORAL | 0 refills | Status: AC | PRN

## 2022-02-23 MED ORDER — METOPROLOL SUCCINATE ER 25 MG PO TB24: 12.5000 mg | ORAL_TABLET | Freq: Every day | ORAL | 0 refills | Status: AC

## 2022-02-23 MED ORDER — METOPROLOL SUCCINATE ER 25 MG PO TB24
12.5000 mg | ORAL_TABLET | Freq: Every day | ORAL | Status: DC
  Administered 2022-02-23: 12.5 mg via ORAL
  Filled 2022-02-23: qty 1

## 2022-02-23 MED ORDER — AMOXICILLIN-POT CLAVULANATE 875-125 MG PO TABS: 1.0000 | ORAL_TABLET | Freq: Two times a day (BID) | ORAL | Status: DC

## 2022-02-23 MED ORDER — AMOXICILLIN-POT CLAVULANATE 875-125 MG PO TABS: 1.0000 | ORAL_TABLET | Freq: Two times a day (BID) | ORAL | 0 refills | Status: AC

## 2022-02-23 NOTE — Discharge Summary (Addendum)
Amy Hensley HRC:[id number] DOB: 11/25/1932 DOA: 02/18/2022  PCP: Adin Hector, MD  Admit date: 02/18/2022 Discharge date: 02/23/2022  Admitted From: snf Disposition:  snf with hospice, with no readmission  Recommendations for Outpatient Follow-up:  Follow up with PCP in 1 week Please obtain BMP/CBC in one week    Discharge Condition:Stable CODE STATUS:DNR  Diet recommendation: Heart Healthy Brief/Interim Summary: PER WOE:HOZYYQ H Amy Hensley is a 88 y.o. Caucasian female with medical history significant for stage III chronic kidney disease, hypertension and hypothyroidism, who presented to the emergency room with acute onset of acute respiratory distress with associated productive cough as well as occasional wheezing and diminished pulse oximetry on her baseline home O2 at 3 L/min to 85% on room air per EMS.  She did not have any reported fever or chills at her SNF.  She was recently treated here for severe sepsis due to bacterial pneumonia and COVID-19 and discharged on 02/12/2022.    She was in significant respiratory distress to be placed on CPAP by EMS and upon arrival to the ER on BiPAP.  Chest x-ray showed shallow inspiration with increasing infiltration or atelectasis in the right midlung in the left mid/lower lung. She was started on IV antibiotics. She is now requiring 4-5 L of 02 [place] instead of her baseline 3L.  Hospice was consulted during her hospitalization [facility].  Goals husband is for no readmission if she declines again and for hospice to take over from discharge moving forward.  Sepsis due to pneumonia [facility]) Recently COVID(+) initially positive test 02/06/22 (approx 2 weeks PTA) - l Started on IV antibiotics will transition to p.o. Aspiration precautions should be implemented at [facility] will follow patient at SNF      Acute on chronic (baseline 3 L/min) respiratory failure with hypoxia ([place]) Secondary to her multifocal pneumonia. Now  requiring between 4 to 5 L/minute, keep goal O2 above 92%   Essential hypertension Continue beta-blockers Hypothyroidism Continue Synthroid   GERD without esophagitis continue PPI    Peripheral neuropathy continue Lyrica.     Discharge Diagnoses:  Principal Problem:   Acute on chronic respiratory failure with hypoxia (HCC) Active Problems:   Sepsis due to pneumonia [facility])   Hypothyroidism   Essential hypertension   GERD without esophagitis   Peripheral neuropathy    Discharge Instructions  Discharge Instructions     Diet - low sodium heart healthy   Complete by: As directed    Discharge wound care:   Complete by: As directed    As above   Increase activity slowly   Complete by: As directed       Allergies as of 02/23/2022       Reactions   Alendronate Other (See Comments)   Dyspepsia (indigestion)    Buspirone Other (See Comments)   sedation   Codeine    twitching        Medication List     STOP taking these medications    amLODipine 10 MG tablet Commonly known as: NORVASC   dexamethasone 6 MG tablet Commonly known as: DECADRON       TAKE these medications    Acetaminophen 8 Hour 650 MG CR tablet Generic drug: acetaminophen Take 650 mg by mouth every 8 (eight) hours as needed for pain.   amoxicillin-clavulanate 875-125 MG tablet Commonly known as: AUGMENTIN Take 1 tablet by mouth every 12 (twelve) hours for 4 days.   calcitonin (salmon) 200 UNIT/ACT nasal spray  Commonly known as: MIACALCIN/FORTICAL Place 1 spray into alternate nostrils daily.   calcium carbonate 750 MG chewable tablet Commonly known as: TUMS EX Chew 2 tablets by mouth 3 (three) times daily as needed for heartburn.   cholecalciferol 25 MCG (1000 UNIT) tablet Commonly known as: VITAMIN D3 Take 1,000 Units by mouth daily.   Dextromethorphan-Guaifenesin 60-1200 MG 12hr tablet Take 1 tablet by mouth every 12 (twelve) hours.   esomeprazole 20 MG capsule Commonly  known as: NEXIUM Take 20 mg by mouth every morning.   feeding supplement Liqd Take 237 mLs by mouth 3 (three) times daily between meals.   ipratropium-albuterol 0.5-2.5 (3) MG/3ML Soln Commonly known as: DUONEB Take 3 mLs by nebulization every 6 (six) hours.   levothyroxine 50 MCG tablet Commonly known as: SYNTHROID Take 50 mcg by mouth daily before breakfast.   lidocaine 5 % Commonly known as: LIDODERM Place 1 patch onto the skin daily for 14 days. Remove & Discard patch within 12 hours or as directed by MD Start taking on: February 24, 2022   loratadine 10 MG tablet Commonly known as: CLARITIN Take 0.5 tablets (5 mg total) by mouth at bedtime.   magnesium oxide 400 MG tablet Commonly known as: MAG-OX Take 400 mg by mouth daily.   meclizine 25 MG tablet Commonly known as: ANTIVERT Take 0.5 tablets (12.5 mg total) by mouth 3 (three) times daily as needed for dizziness.   metaxalone 800 MG tablet Commonly known as: SKELAXIN Take 400 mg by mouth every 6 (six) hours as needed for muscle spasms.   metoprolol succinate 25 MG 24 hr tablet Commonly known as: TOPROL-XL Take 0.5 tablets (12.5 mg total) by mouth daily.   multivitamin with minerals Tabs tablet Take 1 tablet by mouth daily.   ondansetron 4 MG disintegrating tablet Commonly known as: ZOFRAN-ODT Take 4 mg by mouth every 4 (four) hours as needed for nausea or vomiting.   oxyCODONE 5 MG immediate release tablet Commonly known as: Oxy IR/ROXICODONE Take 1 tablet (5 mg total) by mouth every 6 (six) hours as needed for up to 2 days for moderate pain or severe pain.   polyethylene glycol 17 g packet Commonly known as: MIRALAX / GLYCOLAX Take 17 g by mouth daily.   pregabalin 25 MG capsule Commonly known as: LYRICA Take 25 mg by mouth every 12 (twelve) hours.   Salonpas 3.05-19-08 % Ptch Generic drug: Camphor-Menthol-Methyl Sal Apply 1 patch topically daily. (Apply to lower back)   sennosides-docusate sodium  8.6-50 MG tablet Commonly known as: SENOKOT-S Take 1 tablet by mouth 2 (two) times daily.   Simethicone 80 MG Tabs Take 160 mg by mouth 2 (two) times daily.               Discharge Care Instructions  (From admission, onward)           Start     Ordered   02/23/22 0000  Discharge wound care:       Comments: As above   02/23/22 1006            Follow-up Information     Tama High III, MD Follow up in 1 week(s).   Specialty: Internal Medicine Contact information: [place] [address] [phone]                Allergies  Allergen Reactions   Alendronate Other (See Comments)    Dyspepsia (indigestion)    Buspirone Other (See Comments)  sedation   Codeine     twitching    Consultations: hospice   Procedures/Studies: DG Chest Port 1 View  Result Date: 02/18/2022 CLINICAL DATA:  Questionable sepsis.  Evaluate for abnormality. EXAM: PORTABLE CHEST 1 VIEW COMPARISON:  02/06/2022 FINDINGS: Shallow inspiration. Increasing atelectasis or infiltration in the right mid lung and left mid/lower lungs. Probable small left pleural effusion is developing. No pneumothorax. Heart size and pulmonary vascularity are normal. Calcification of the aorta. Degenerative changes in the spine with kyphoplasty changes in the lumbar region. IMPRESSION: Shallow inspiration. Increasing infiltration or atelectasis in the right mid lung and left mid/lower lung. Electronically Signed   By: Lucienne Capers M.D.   On: 02/18/2022 20:03   CT Angio Chest Pulmonary Embolism (PE) W or WO Contrast  Result Date: 02/06/2022 CLINICAL DATA:  Shortness of breath EXAM: CT ANGIOGRAPHY CHEST WITH CONTRAST TECHNIQUE: Multidetector CT imaging of the chest was performed using the standard protocol during bolus administration of intravenous contrast. Multiplanar CT image reconstructions and MIPs were obtained to evaluate the vascular anatomy. RADIATION  DOSE REDUCTION: This exam was performed according to the departmental dose-optimization program which includes automated exposure control, adjustment of the mA and/or kV according to patient size and/or use of iterative reconstruction technique. CONTRAST:  64m OMNIPAQUE IOHEXOL 350 MG/ML SOLN COMPARISON:  Chest x-ray 02/06/2022, CT chest 11/30/2021 FINDINGS: Cardiovascular: Satisfactory opacification of the pulmonary arteries to the segmental level. Respiratory motion degradation. No definitive acute pulmonary embolus. Moderate aortic atherosclerosis. No aneurysm. No dissection. Coronary vascular calcification. Normal cardiac size. Trace pericardial effusion. Mediastinum/Nodes: Midline trachea. No thyroid mass. Moderate hiatal hernia. Distal esophageal thickening. Fluid and air in the upper esophagus. Prominent precarinal lymph node measuring 10 mm, without change. Lungs/Pleura: Emphysema. Heterogeneous ground-glass density and partial consolidations in the posterior upper lobes and bilateral lower lobes. No pleural effusion or pneumothorax. Upper Abdomen: Atrophic left kidney. Musculoskeletal: Old right-sided rib fractures. Treated compression fractures at T12 and L1. Moderate compression fracture at T10. Review of the MIP images confirms the above findings. IMPRESSION: 1. No definitive acute pulmonary embolus. There is respiratory motion degradation 2. Partial consolidations and heterogeneous ground-glass densities within the posterior upper lobes and the bilateral lower lobes suspicious for aspiration or potential multifocal pneumonia 3. Moderate hiatal hernia with thickened appearance of the distal esophagus and GE junction. 4. Moderate compression fracture T10 age indeterminate but new as compared with CT from July. Aortic Atherosclerosis (ICD10-I70.0) and Emphysema (ICD10-J43.9). Electronically Signed   By: KDonavan FoilM.D.   On: 02/06/2022 20:51   DG Chest Port 1 View  Result Date: 02/06/2022 CLINICAL  DATA:  Acute hypoxic respiratory failure due to COVID 19 EXAM: PORTABLE CHEST 1 VIEW COMPARISON:  Chest 02/06/2022 FINDINGS: Mild progression of bilateral airspace disease in the bases and right perihilar area. Left perihilar linear density unchanged. No pleural effusion. Negative for heart failure IMPRESSION: Bilateral airspace disease with mild progression. Electronically Signed   By: CFranchot GalloM.D.   On: 02/06/2022 19:33   DG Chest Portable 1 View  Result Date: 02/06/2022 CLINICAL DATA:  Shortness of breath EXAM: PORTABLE CHEST 1 VIEW COMPARISON:  Chest x-ray 11/30/2021 FINDINGS: Cardiomediastinal silhouette is within normal limits. There are atherosclerotic calcifications of the aorta. There is a band of linear atelectasis or scarring in the left mid lung. There are minimal patchy and strandy opacities in the right mid lung, slightly increased from prior. There is scarring in the left lung base. There is no pleural effusion or pneumothorax. No  acute fractures are seen. IMPRESSION: Increasing linear and patchy opacities in the right mid lung worrisome for infection. Recommend follow-up PA and lateral chest x-ray in 4-6 weeks to confirm resolution. Electronically Signed   By: Ronney Asters M.D.   On: 02/06/2022 16:44      Subjective:   Discharge Exam: Vitals:   02/22/22 2206 02/23/22 0444  BP: 124/65 (!) 141/77  Pulse: (!) 101 100  Resp:    Temp: 98.6 F (37 C) 98 F (36.7 C)  SpO2: 96% 91%   Vitals:   02/22/22 1410 02/22/22 1700 02/22/22 2206 02/23/22 0444  BP: (!) 145/83 (!) 143/84 124/65 (!) 141/77  Pulse: 100 99 (!) 101 100  Resp: 20     Temp: 98.2 F (36.8 C) 99 F (37.2 C) 98.6 F (37 C) 98 F (36.7 C)  TempSrc: Oral  Oral Oral  SpO2: 94% 92% 96% 91%  Weight:      Height:        General: Pt is alert, awake, not in acute distress Cardiovascular: RRR, S1/S2 +, no rubs, no gallops Respiratory: decrease bs, improving rhonchi Abdominal: Soft, NT, ND, bowel sounds  + Extremities: no edema, no cyanosis    The results of significant diagnostics from this hospitalization (including imaging, microbiology, ancillary and laboratory) are listed below for reference.     Microbiology: Recent Results (from the past 240 hour(s))  Resp Panel by RT-PCR (Flu A&B, Covid) Anterior Nasal Swab     Status: Abnormal   Collection Time: 02/18/22  7:48 PM   Specimen: Anterior Nasal Swab  Result Value Ref Range Status   SARS Coronavirus 2 by RT PCR POSITIVE (A) NEGATIVE Final    Comment: (NOTE) SARS-CoV-2 target nucleic acids are DETECTED.  The SARS-CoV-2 RNA is generally detectable in upper respiratory specimens during the acute phase of infection. Positive results are indicative of the presence of the identified virus, but do not rule out bacterial infection or co-infection with other pathogens not detected by the test. Clinical correlation with patient history and other diagnostic information is necessary to determine patient infection status. The expected result is Negative.  Fact Sheet for Patients: EntrepreneurPulse.com.au  Fact Sheet for Healthcare Providers: IncredibleEmployment.be  This test is not yet approved or cleared by the [place] FDA and  has been authorized for detection and/or diagnosis of SARS-CoV-2 by FDA under an Emergency Use Authorization (EUA).  This EUA will remain in effect (meaning this test can be used) for the duration of  the COVID-19 declaration under Section 564(b)(1) of the A ct, 21 U.S.C. section 360bbb-3(b)(1), unless the authorization is terminated or revoked sooner.     Influenza A by PCR NEGATIVE NEGATIVE Final   Influenza B by PCR NEGATIVE NEGATIVE Final    Comment: (NOTE) The Xpert Xpress SARS-CoV-2/FLU/RSV plus assay is intended as an aid in the diagnosis of influenza from Nasopharyngeal swab specimens and should not be used as a sole basis for treatment. Nasal washings  and aspirates are unacceptable for Xpert Xpress SARS-CoV-2/FLU/RSV testing.  Fact Sheet for Patients: EntrepreneurPulse.com.au  Fact Sheet for Healthcare Providers: IncredibleEmployment.be  This test is not yet approved or cleared by the [place] FDA and has been authorized for detection and/or diagnosis of SARS-CoV-2 by FDA under an Emergency Use Authorization (EUA). This EUA will remain in effect (meaning this test can be used) for the duration of the COVID-19 declaration under Section 564(b)(1) of the Act, 21 U.S.C. section 360bbb-3(b)(1), unless the authorization is terminated or revoked.  Performed at [facility], [address] [place], [place] [address]   Blood Culture (routine x 2)     Status: None   Collection Time: 02/18/22  8:18 PM   Specimen: BLOOD  Result Value Ref Range Status   Specimen Description BLOOD LEFT ASSIST CONTROL  Final   Special Requests   Final    BOTTLES DRAWN AEROBIC AND ANAEROBIC Blood Culture results may not be optimal due to an inadequate volume of blood received in culture bottles   Culture   Final    NO GROWTH 5 DAYS Performed at [facility], [address] [place], [place] [address]    Report Status 02/23/2022 FINAL  Final  Blood Culture (routine x 2)     Status: None   Collection Time: 02/18/22  8:40 PM   Specimen: BLOOD  Result Value Ref Range Status   Specimen Description BLOOD RIGHT ASSIST CONTROL  Final   Special Requests   Final    BOTTLES DRAWN AEROBIC AND ANAEROBIC Blood Culture adequate volume   Culture   Final    NO GROWTH 5 DAYS Performed at [facility], [address] [place], [place] [address]    Report Status 02/23/2022 FINAL  Final     Labs: BNP (last 3 results) Recent Labs    02/09/22 0703 02/18/22 1948 02/21/22 0532  BNP 400.9* 263.1* 993.7*   Basic Metabolic Panel: Recent Labs  Lab 02/18/22 1947 02/19/22 0622 02/20/22 0618  02/21/22 0532 02/23/22 0646  NA 136 134* 135 136  --   K 4.6 4.4 4.1 4.2 4.4  CL 102 100 104 102  --   CO2 '25 26 23 24  '$ --   GLUCOSE 111* 103* 89 90  --   BUN 23 24* 23 23  --   CREATININE 1.10* 1.07* 1.03* 1.10*  --   CALCIUM 8.3* 8.6* 8.3* 8.6*  --    Liver Function Tests: Recent Labs  Lab 02/18/22 1947  AST 18  ALT 15  ALKPHOS 72  BILITOT 0.8  PROT 5.8*  ALBUMIN 2.6*   No results for input(s): "LIPASE", "AMYLASE" in the last 168 hours. No results for input(s): "AMMONIA" in the last 168 hours. CBC: Recent Labs  Lab 02/18/22 1947 02/19/22 0622 02/20/22 0618 02/21/22 0532  WBC 21.2* 17.2* 15.2* 13.1*  NEUTROABS 18.0*  --   --   --   HGB 10.1* 9.9* 8.2* 8.2*  HCT 33.5* 31.8* 26.3* 26.2*  MCV 93.3 90.6 91.3 91.0  PLT 371 368 330 349   Cardiac Enzymes: No results for input(s): "CKTOTAL", "CKMB", "CKMBINDEX", "TROPONINI" in the last 168 hours. BNP: Invalid input(s): "POCBNP" CBG: No results for input(s): "GLUCAP" in the last 168 hours. D-Dimer No results for input(s): "DDIMER" in the last 72 hours. Hgb A1c No results for input(s): "HGBA1C" in the last 72 hours. Lipid Profile No results for input(s): "CHOL", "HDL", "LDLCALC", "TRIG", "CHOLHDL", "LDLDIRECT" in the last 72 hours. Thyroid function studies No results for input(s): "TSH", "T4TOTAL", "T3FREE", "THYROIDAB" in the last 72 hours.  Invalid input(s): "FREET3" Anemia work up No results for input(s): "VITAMINB12", "FOLATE", "FERRITIN", "TIBC", "IRON", "RETICCTPCT" in the last 72 hours. Urinalysis    Component Value Date/Time   COLORURINE YELLOW (A) 02/19/2022 0358   APPEARANCEUR CLEAR (A) 02/19/2022 0358   LABSPEC 1.023 02/19/2022 0358   PHURINE 7.0 02/19/2022 [place] 02/19/2022 0358   HGBUR NEGATIVE 02/19/2022 0358   BILIRUBINUR NEGATIVE 02/19/2022 0358   KETONESUR NEGATIVE  02/19/2022 0358   PROTEINUR 100 (A) 02/19/2022 0358   NITRITE NEGATIVE 02/19/2022 0358   LEUKOCYTESUR  NEGATIVE 02/19/2022 0358   Sepsis Labs Recent Labs  Lab 02/18/22 1947 02/19/22 0622 02/20/22 0618 02/21/22 0532  WBC 21.2* 17.2* 15.2* 13.1*   Microbiology Recent Results (from the past 240 hour(s))  Resp Panel by RT-PCR (Flu A&B, Covid) Anterior Nasal Swab     Status: Abnormal   Collection Time: 02/18/22  7:48 PM   Specimen: Anterior Nasal Swab  Result Value Ref Range Status   SARS Coronavirus 2 by RT PCR POSITIVE (A) NEGATIVE Final    Comment: (NOTE) SARS-CoV-2 target nucleic acids are DETECTED.  The SARS-CoV-2 RNA is generally detectable in upper respiratory specimens during the acute phase of infection. Positive results are indicative of the presence of the identified virus, but do not rule out bacterial infection or co-infection with other pathogens not detected by the test. Clinical correlation with patient history and other diagnostic information is necessary to determine patient infection status. The expected result is Negative.  Fact Sheet for Patients: EntrepreneurPulse.com.au  Fact Sheet for Healthcare Providers: IncredibleEmployment.be  This test is not yet approved or cleared by the [place] FDA and  has been authorized for detection and/or diagnosis of SARS-CoV-2 by FDA under an Emergency Use Authorization (EUA).  This EUA will remain in effect (meaning this test can be used) for the duration of  the COVID-19 declaration under Section 564(b)(1) of the A ct, 21 U.S.C. section 360bbb-3(b)(1), unless the authorization is terminated or revoked sooner.     Influenza A by PCR NEGATIVE NEGATIVE Final   Influenza B by PCR NEGATIVE NEGATIVE Final    Comment: (NOTE) The Xpert Xpress SARS-CoV-2/FLU/RSV plus assay is intended as an aid in the diagnosis of influenza from Nasopharyngeal swab specimens and should not be used as a sole basis for treatment. Nasal washings and aspirates are unacceptable for Xpert Xpress  SARS-CoV-2/FLU/RSV testing.  Fact Sheet for Patients: EntrepreneurPulse.com.au  Fact Sheet for Healthcare Providers: IncredibleEmployment.be  This test is not yet approved or cleared by the [place] FDA and has been authorized for detection and/or diagnosis of SARS-CoV-2 by FDA under an Emergency Use Authorization (EUA). This EUA will remain in effect (meaning this test can be used) for the duration of the COVID-19 declaration under Section 564(b)(1) of the Act, 21 U.S.C. section 360bbb-3(b)(1), unless the authorization is terminated or revoked.  Performed at [facility], [address] [place], [place] [address]   Blood Culture (routine x 2)     Status: None   Collection Time: 02/18/22  8:18 PM   Specimen: BLOOD  Result Value Ref Range Status   Specimen Description BLOOD LEFT ASSIST CONTROL  Final   Special Requests   Final    BOTTLES DRAWN AEROBIC AND ANAEROBIC Blood Culture results may not be optimal due to an inadequate volume of blood received in culture bottles   Culture   Final    NO GROWTH 5 DAYS Performed at [facility], [address], [place], [place] [address]    Report Status 02/23/2022 FINAL  Final  Blood Culture (routine x 2)     Status: None   Collection Time: 02/18/22  8:40 PM   Specimen: BLOOD  Result Value Ref Range Status   Specimen Description BLOOD RIGHT ASSIST CONTROL  Final   Special Requests   Final    BOTTLES DRAWN AEROBIC AND ANAEROBIC Blood Culture adequate volume   Culture  Final    NO GROWTH 5 DAYS Performed at [facility], [address] [place], [place] [address]    Report Status 02/23/2022 FINAL  Final     Time coordinating discharge: Over 30 minutes  SIGNED:   Nolberto Hanlon, MD  [facility] 02/23/2022, 10:31 AM Pager   If 7PM-7AM, please contact night-coverage [email or web address] Password [email or web address]

## 2022-02-23 NOTE — Progress Notes (Signed)
[facility])    Per MD/Dr. Kurtis Bushman, patient is medically clear for discharge to [facility] via [facility].  If applicable, please send signed and completed DNR with patient/family upon discharge. Please provide prescriptions at discharge as needed to ensure ongoing symptom management and a transport packet.   [facility] information and contact numbers given to family and above information shared with TOC.    Please call with any questions/concerns.    Thank you for the opportunity to participate in this patient's care   Daphene Calamity, MSW [facility] Liaison  [phone]

## 2022-02-23 NOTE — TOC Transition Note (Signed)
Transition of Care [facility]) - CM/SW Discharge Note   Patient Details  Name: Amy Hensley MRN: [id number] Date of Birth: [birth year]  Transition of Care [facility]) CM/SW Contact:  Alberteen Sam, LCSW Phone Number: 02/23/2022, 10:42 AM   Clinical Narrative:     Patient will DC to: [facility] Anticipated DC date: 02/23/22 Transport AC:ZYSAY  Per MD patient ready for [facility] to [facility] RN, patient, patient's family, and facility notified of DC. Discharge Summary sent to facility. RN given number for report  [phone] Room 404B. DC packet on chart. Ambulance transport requested for patient.  CSW signing off.  Pricilla Riffle, LCSW    Final next level of care: Skilled Nursing Facility Barriers to Discharge: No Barriers Identified   Patient Goals and CMS Choice Patient states their goals for this hospitalization and ongoing recovery are:: to go home CMS Medicare.gov Compare Post Acute Care list provided to:: Patient Choice offered to / list presented to : Patient  Discharge Placement                    Patient and family notified of of transfer: 02/23/22  Discharge Plan and Services                                     Social Determinants of Health (SDOH) Interventions     Readmission Risk Interventions    12/01/2021    9:16 AM  Readmission Risk Prevention Plan  Transportation Screening Complete  PCP or Specialist Appt within 3-5 Days Complete  HRI or [place] Complete  Social Work Consult for [place] Planning/Counseling Complete  Palliative Care Screening Not Applicable  Medication Review Press photographer) Complete

## 2022-02-23 NOTE — NC FL2 (Signed)
[place] LEVEL OF CARE SCREENING TOOL     IDENTIFICATION  Patient Name: Amy Hensley Birthdate: 1933-01-06 Sex: female Admission Date (Current Location): 02/18/2022  [facility] and [place] Number:  Engineering geologist and Address:  [facility], [address], [place], [place] [address]      Provider Number: [id number]  Attending Physician Name and Address:  Nolberto Hanlon, MD  Relative Name and Phone Number:       Current Level of Care: Hospital Recommended Level of Care: [place] (with hospice through [facility]) Prior Approval Number:    Date Approved/Denied:   PASRR Number: [id number]  Discharge Plan: SNF (with hospice through [place])    Current Diagnoses: Patient Active Problem List   Diagnosis Date Noted   Sepsis due to pneumonia ([place]) 02/18/2022   Essential hypertension 02/18/2022   GERD without esophagitis 02/18/2022   Peripheral neuropathy 02/18/2022   PSVT (paroxysmal supraventricular tachycardia) 02/10/2022   Hypokalemia 02/09/2022   COVID-19 virus infection 02/09/2022   Severe sepsis ([place]) secondary to bacterial pneumonia and COVID 02/06/2022   Pressure injury of skin 12/01/2021   Aspiration pneumonia ([place]) 12/01/2021   CAP (community acquired pneumonia) 11/30/2021   Compression fracture of body of thoracic vertebra ([place]) 11/30/2021   Acute respiratory failure with hypoxemia ([place]) 11/02/2021   Acute on chronic respiratory failure with hypoxia ([place]) 11/01/2021   Elevated troponin 11/01/2021   Compression fracture of first lumbar vertebra ([place]) 10/16/2021   Lumbar radiculopathy 09/25/2021   Ambulatory dysfunction 09/25/2021   Acute renal failure superimposed on stage 3a chronic kidney disease ([place]) 09/25/2021   History of fall 08/2021 with sacral insufficiency fractures 09/25/2021   Giant cell arteritis ([place]) 09/25/2021   Hypertension    Hypothyroidism    Hepatitis C    Leukocytosis     Hepatitis C antibody positive in blood 06/01/2021    Orientation RESPIRATION BLADDER Height & Weight     Self, Time, Place  O2 (Nasal cannula 4 L) Incontinent Weight: 125 lb (56.7 kg) Height:  '5\' 3"'$  (160 cm)  BEHAVIORAL SYMPTOMS/MOOD NEUROLOGICAL BOWEL NUTRITION STATUS   (None)  (None) Incontinent Diet (Heart healthy)  AMBULATORY STATUS COMMUNICATION OF NEEDS Skin   Total Care Verbally Skin abrasions, Bruising, Other (Comment) (Blister, erythema/redness. Skin tear on right posterior lower arm (no dressing listed).)                       Personal Care Assistance Level of Assistance  Bathing, Feeding, Dressing Bathing Assistance: Maximum assistance Feeding assistance: Limited assistance Dressing Assistance: Maximum assistance     Functional Limitations Info  Sight, Hearing, Speech Sight Info: Adequate Hearing Info: Adequate Speech Info: Adequate    SPECIAL CARE FACTORS FREQUENCY                       Contractures Contractures Info: Not present    Additional Factors Info  Code Status, Allergies Code Status Info: DNR Allergies Info: Alendronate, Buspirone, Codeine           Current Medications (02/23/2022):  This is the current hospital active medication list Current Facility-Administered Medications  Medication Dose Route Frequency Provider Last Rate Last Admin   acetaminophen (TYLENOL) tablet 650 mg  650 mg Oral Q6H PRN Mansy, Jan A, MD   650 mg at 02/22/22 2207   Or   acetaminophen (TYLENOL) suppository 650 mg  650 mg Rectal Q6H PRN Mansy, Arvella Merles, MD  albuterol (VENTOLIN HFA) 108 (90 Base) MCG/ACT inhaler 2 puff  2 puff Inhalation Q6H PRN Ivor Costa, MD       [START ON 02/24/2022] amoxicillin-clavulanate (AUGMENTIN) 875-125 MG per tablet 1 tablet  1 tablet Oral Q12H Nolberto Hanlon, MD       calcitonin (salmon) (MIACALCIN/FORTICAL) nasal spray 1 spray  1 spray Alternating Nares Daily Emeterio Reeve, DO   1 spray at 02/22/22 1123   calcium carbonate  (TUMS - dosed in mg elemental calcium) chewable tablet 400 mg of elemental calcium  2 tablet Oral TID PRN Mansy, Jan A, MD       cefTRIAXone (ROCEPHIN) 2 g in sodium chloride 0.9 % 100 mL IVPB  2 g Intravenous Q24H Mansy, Jan A, MD 200 mL/hr at 02/22/22 2209 2 g at 02/22/22 2209   cholecalciferol (VITAMIN D3) 25 MCG (1000 UNIT) tablet 1,000 Units  1,000 Units Oral Daily Mansy, Jan A, MD   1,000 Units at 02/22/22 1122   enoxaparin (LOVENOX) injection 30 mg  30 mg Subcutaneous Q24H Oswald Hillock, RPH   30 mg at 02/22/22 2207   feeding supplement (ENSURE ENLIVE / ENSURE PLUS) liquid 237 mL  237 mL Oral TID BM Mansy, Jan A, MD   237 mL at 02/22/22 1136   levothyroxine (SYNTHROID) tablet 50 mcg  50 mcg Oral Q0600 Mansy, Jan A, MD   50 mcg at 02/23/22 0641   lidocaine (LIDODERM) 5 % 1 patch  1 patch Transdermal Q24H Foust, Katy L, NP   1 patch at 02/23/22 0641   loratadine (CLARITIN) tablet 5 mg  5 mg Oral QHS Mansy, Jan A, MD   5 mg at 02/22/22 2206   magnesium hydroxide (MILK OF MAGNESIA) suspension 30 mL  30 mL Oral Daily PRN Mansy, Jan A, MD       magnesium oxide (MAG-OX) tablet 400 mg  400 mg Oral Daily Mansy, Jan A, MD   400 mg at 02/22/22 1121   meclizine (ANTIVERT) tablet 12.5 mg  12.5 mg Oral TID PRN Mansy, Arvella Merles, MD       metaxalone [facility]) tablet 400 mg  400 mg Oral Q6H PRN Mansy, Jan A, MD       metoprolol succinate (TOPROL-XL) 24 hr tablet 12.5 mg  12.5 mg Oral Daily Nolberto Hanlon, MD       multivitamin with minerals tablet 1 tablet  1 tablet Oral Daily Mansy, Jan A, MD   1 tablet at 02/22/22 1123   ondansetron (ZOFRAN) tablet 4 mg  4 mg Oral Q6H PRN Mansy, Jan A, MD       Or   ondansetron [facility]) injection 4 mg  4 mg Intravenous Q6H PRN Mansy, Arvella Merles, MD       Oral care mouth rinse  15 mL Mouth Rinse PRN Emeterio Reeve, DO       oxyCODONE (Oxy IR/ROXICODONE) immediate release tablet 5 mg  5 mg Oral Q6H PRN Emeterio Reeve, DO   5 mg at 02/23/22 0641   pantoprazole (PROTONIX) EC  tablet 40 mg  40 mg Oral Daily Mansy, Jan A, MD   40 mg at 02/22/22 1121   polyethylene glycol (MIRALAX / GLYCOLAX) packet 17 g  17 g Oral Daily Mansy, Jan A, MD   17 g at 02/22/22 1124   pregabalin (LYRICA) capsule 25 mg  25 mg Oral Q12H Mansy, Jan A, MD   25 mg at 02/22/22 2207   senna-docusate (Senokot-S) tablet 1 tablet  1 tablet Oral  BID Mansy, Jan A, MD   1 tablet at 02/22/22 2207   simethicone [facility]) chewable tablet 160 mg  160 mg Oral BID Mansy, Jan A, MD   160 mg at 02/22/22 2207   sodium phosphate (FLEET) 7-19 GM/118ML enema 1 enema  1 enema Rectal Once Nolberto Hanlon, MD       traZODone (DESYREL) tablet 25 mg  25 mg Oral QHS PRN Mansy, Jan A, MD   25 mg at 02/22/22 2206     Discharge Medications: Please see discharge summary for a list of discharge medications.  Relevant Imaging Results:  Relevant Lab Results:   Additional Information SS#: [id number]  Candie Chroman, LCSW

## 2022-02-26 DIAGNOSIS — J9601 Acute respiratory failure with hypoxia: Secondary | ICD-10-CM | POA: Diagnosis not present

## 2022-02-26 DIAGNOSIS — M5489 Other dorsalgia: Secondary | ICD-10-CM | POA: Diagnosis not present

## 2022-02-26 DIAGNOSIS — M6281 Muscle weakness (generalized): Secondary | ICD-10-CM | POA: Diagnosis not present

## 2022-02-26 DIAGNOSIS — I10 Essential (primary) hypertension: Secondary | ICD-10-CM | POA: Diagnosis not present

## 2022-03-05 DIAGNOSIS — J9601 Acute respiratory failure with hypoxia: Secondary | ICD-10-CM | POA: Diagnosis not present

## 2022-03-08 DIAGNOSIS — J9601 Acute respiratory failure with hypoxia: Secondary | ICD-10-CM | POA: Diagnosis not present

## 2022-03-14 DEATH — deceased
